# Patient Record
Sex: Male | Born: 1935
Health system: Southern US, Community
[De-identification: ages and names within clinical notes are randomized; demographics above are authoritative.]

## PROBLEM LIST (undated history)

## (undated) DIAGNOSIS — I708 Atherosclerosis of other arteries: Secondary | ICD-10-CM

## (undated) DIAGNOSIS — E785 Hyperlipidemia, unspecified: Secondary | ICD-10-CM

## (undated) DIAGNOSIS — E119 Type 2 diabetes mellitus without complications: Secondary | ICD-10-CM

## (undated) DIAGNOSIS — J449 Chronic obstructive pulmonary disease, unspecified: Secondary | ICD-10-CM

## (undated) DIAGNOSIS — I471 Supraventricular tachycardia, unspecified: Secondary | ICD-10-CM

## (undated) DIAGNOSIS — K289 Gastrojejunal ulcer, unspecified as acute or chronic, without hemorrhage or perforation: Secondary | ICD-10-CM

## (undated) DIAGNOSIS — D696 Thrombocytopenia, unspecified: Secondary | ICD-10-CM

## (undated) DIAGNOSIS — K219 Gastro-esophageal reflux disease without esophagitis: Secondary | ICD-10-CM

## (undated) DIAGNOSIS — F17200 Nicotine dependence, unspecified, uncomplicated: Secondary | ICD-10-CM

## (undated) DIAGNOSIS — N402 Nodular prostate without lower urinary tract symptoms: Secondary | ICD-10-CM

## (undated) DIAGNOSIS — M5126 Other intervertebral disc displacement, lumbar region: Secondary | ICD-10-CM

## (undated) DIAGNOSIS — K2270 Barrett's esophagus without dysplasia: Secondary | ICD-10-CM

## (undated) DIAGNOSIS — I25810 Atherosclerosis of coronary artery bypass graft(s) without angina pectoris: Secondary | ICD-10-CM

## (undated) DIAGNOSIS — R413 Other amnesia: Secondary | ICD-10-CM

## (undated) DIAGNOSIS — K5904 Chronic idiopathic constipation: Secondary | ICD-10-CM

## (undated) DIAGNOSIS — I700 Atherosclerosis of aorta: Secondary | ICD-10-CM

## (undated) DIAGNOSIS — N400 Enlarged prostate without lower urinary tract symptoms: Secondary | ICD-10-CM

## (undated) DIAGNOSIS — H9190 Unspecified hearing loss, unspecified ear: Secondary | ICD-10-CM

## (undated) HISTORY — DX: Chronic obstructive pulmonary disease, unspecified: J44.9

## (undated) HISTORY — DX: Other amnesia: R41.3

## (undated) HISTORY — DX: Barrett's esophagus without dysplasia: K22.70

## (undated) HISTORY — DX: Hyperlipidemia, unspecified: E78.5

## (undated) HISTORY — DX: Benign prostatic hyperplasia without lower urinary tract symptoms: N40.0

## (undated) HISTORY — DX: Chronic idiopathic constipation: K59.04

## (undated) HISTORY — DX: Nicotine dependence, unspecified, uncomplicated: F17.200

## (undated) HISTORY — PX: HEMORRHOID SURGERY: SHX153

## (undated) HISTORY — DX: Nodular prostate without lower urinary tract symptoms: N40.2

## (undated) HISTORY — PX: NOSE SURGERY: SHX723

## (undated) HISTORY — DX: Supraventricular tachycardia, unspecified: I47.10

## (undated) HISTORY — DX: Thrombocytopenia, unspecified: D69.6

## (undated) HISTORY — DX: Other intervertebral disc displacement, lumbar region: M51.26

## (undated) HISTORY — DX: Type 2 diabetes mellitus without complications: E11.9

## (undated) HISTORY — DX: Supraventricular tachycardia: I47.1

## (undated) HISTORY — DX: Unspecified hearing loss, unspecified ear: H91.90

## (undated) HISTORY — DX: Atherosclerosis of coronary artery bypass graft(s) without angina pectoris: I25.810

## (undated) HISTORY — DX: Gastrojejunal ulcer, unspecified as acute or chronic, without hemorrhage or perforation: K28.9

---

## 1997-09-16 HISTORY — PX: CORONARY ARTERY BYPASS GRAFT: SHX141

## 1997-12-21 ENCOUNTER — Inpatient Hospital Stay (HOSPITAL_COMMUNITY): Admission: EM | Admit: 1997-12-21 | Discharge: 1997-12-22 | Payer: Self-pay | Admitting: Emergency Medicine

## 1998-03-10 ENCOUNTER — Inpatient Hospital Stay (HOSPITAL_COMMUNITY): Admission: EM | Admit: 1998-03-10 | Discharge: 1998-03-18 | Payer: Self-pay | Admitting: Emergency Medicine

## 1998-05-02 ENCOUNTER — Encounter (HOSPITAL_COMMUNITY): Admission: RE | Admit: 1998-05-02 | Discharge: 1998-07-31 | Payer: Self-pay | Admitting: *Deleted

## 1999-03-23 ENCOUNTER — Inpatient Hospital Stay (HOSPITAL_COMMUNITY): Admission: EM | Admit: 1999-03-23 | Discharge: 1999-03-24 | Payer: Self-pay | Admitting: Emergency Medicine

## 1999-03-23 ENCOUNTER — Encounter: Payer: Self-pay | Admitting: *Deleted

## 1999-03-24 ENCOUNTER — Encounter: Payer: Self-pay | Admitting: Internal Medicine

## 2000-10-07 ENCOUNTER — Inpatient Hospital Stay (HOSPITAL_COMMUNITY): Admission: EM | Admit: 2000-10-07 | Discharge: 2000-10-08 | Payer: Self-pay | Admitting: Emergency Medicine

## 2000-10-07 ENCOUNTER — Encounter: Payer: Self-pay | Admitting: Emergency Medicine

## 2000-10-08 ENCOUNTER — Encounter: Payer: Self-pay | Admitting: *Deleted

## 2001-01-28 ENCOUNTER — Emergency Department (HOSPITAL_COMMUNITY): Admission: EM | Admit: 2001-01-28 | Discharge: 2001-01-28 | Payer: Self-pay | Admitting: Emergency Medicine

## 2001-01-28 ENCOUNTER — Encounter: Payer: Self-pay | Admitting: Emergency Medicine

## 2002-10-04 ENCOUNTER — Encounter: Payer: Self-pay | Admitting: Family Medicine

## 2002-10-04 ENCOUNTER — Encounter: Admission: RE | Admit: 2002-10-04 | Discharge: 2002-10-04 | Payer: Self-pay | Admitting: Family Medicine

## 2002-10-19 ENCOUNTER — Encounter (INDEPENDENT_AMBULATORY_CARE_PROVIDER_SITE_OTHER): Payer: Self-pay | Admitting: Specialist

## 2002-10-19 ENCOUNTER — Ambulatory Visit (HOSPITAL_COMMUNITY): Admission: RE | Admit: 2002-10-19 | Discharge: 2002-10-19 | Payer: Self-pay | Admitting: Gastroenterology

## 2003-08-04 ENCOUNTER — Encounter: Admission: RE | Admit: 2003-08-04 | Discharge: 2003-08-04 | Payer: Self-pay | Admitting: Family Medicine

## 2004-06-20 ENCOUNTER — Inpatient Hospital Stay (HOSPITAL_COMMUNITY): Admission: EM | Admit: 2004-06-20 | Discharge: 2004-06-21 | Payer: Self-pay | Admitting: Emergency Medicine

## 2004-07-24 ENCOUNTER — Ambulatory Visit: Payer: Self-pay | Admitting: *Deleted

## 2004-12-03 ENCOUNTER — Inpatient Hospital Stay (HOSPITAL_COMMUNITY): Admission: EM | Admit: 2004-12-03 | Discharge: 2004-12-05 | Payer: Self-pay | Admitting: *Deleted

## 2004-12-03 ENCOUNTER — Ambulatory Visit: Payer: Self-pay | Admitting: Cardiology

## 2004-12-19 ENCOUNTER — Ambulatory Visit: Payer: Self-pay | Admitting: *Deleted

## 2005-06-25 ENCOUNTER — Ambulatory Visit: Payer: Self-pay | Admitting: *Deleted

## 2006-01-08 ENCOUNTER — Ambulatory Visit: Payer: Self-pay | Admitting: *Deleted

## 2006-10-28 ENCOUNTER — Ambulatory Visit: Payer: Self-pay | Admitting: *Deleted

## 2008-05-25 ENCOUNTER — Ambulatory Visit: Payer: Self-pay | Admitting: Cardiology

## 2008-05-31 ENCOUNTER — Ambulatory Visit: Payer: Self-pay

## 2009-03-16 HISTORY — PX: COLONOSCOPY: SHX174

## 2009-03-28 LAB — HM COLONOSCOPY

## 2009-04-14 ENCOUNTER — Encounter (INDEPENDENT_AMBULATORY_CARE_PROVIDER_SITE_OTHER): Payer: Self-pay | Admitting: *Deleted

## 2009-05-13 DIAGNOSIS — Z720 Tobacco use: Secondary | ICD-10-CM

## 2009-05-13 DIAGNOSIS — E7800 Pure hypercholesterolemia, unspecified: Secondary | ICD-10-CM

## 2009-05-13 DIAGNOSIS — Z8679 Personal history of other diseases of the circulatory system: Secondary | ICD-10-CM | POA: Insufficient documentation

## 2009-05-17 ENCOUNTER — Ambulatory Visit: Payer: Self-pay | Admitting: Cardiology

## 2010-03-21 ENCOUNTER — Telehealth (INDEPENDENT_AMBULATORY_CARE_PROVIDER_SITE_OTHER): Payer: Self-pay | Admitting: *Deleted

## 2010-03-22 ENCOUNTER — Telehealth (INDEPENDENT_AMBULATORY_CARE_PROVIDER_SITE_OTHER): Payer: Self-pay | Admitting: *Deleted

## 2010-04-05 ENCOUNTER — Encounter: Payer: Self-pay | Admitting: Family Medicine

## 2010-04-16 HISTORY — PX: ROTATOR CUFF REPAIR: SHX139

## 2010-04-26 ENCOUNTER — Ambulatory Visit: Payer: Self-pay | Admitting: Cardiology

## 2010-05-07 ENCOUNTER — Telehealth: Payer: Self-pay | Admitting: Cardiology

## 2010-07-17 ENCOUNTER — Ambulatory Visit: Payer: Self-pay | Admitting: Family Medicine

## 2010-07-17 DIAGNOSIS — K5904 Chronic idiopathic constipation: Secondary | ICD-10-CM

## 2010-07-17 HISTORY — DX: Chronic idiopathic constipation: K59.04

## 2010-08-14 ENCOUNTER — Ambulatory Visit: Payer: Self-pay | Admitting: Internal Medicine

## 2010-08-14 DIAGNOSIS — E118 Type 2 diabetes mellitus with unspecified complications: Secondary | ICD-10-CM

## 2010-08-20 ENCOUNTER — Ambulatory Visit: Payer: Self-pay | Admitting: Internal Medicine

## 2010-08-29 ENCOUNTER — Ambulatory Visit: Payer: Self-pay | Admitting: Internal Medicine

## 2010-10-04 ENCOUNTER — Encounter: Payer: Self-pay | Admitting: Family Medicine

## 2010-10-04 ENCOUNTER — Ambulatory Visit
Admission: RE | Admit: 2010-10-04 | Discharge: 2010-10-04 | Payer: Self-pay | Source: Home / Self Care | Attending: Family Medicine | Admitting: Family Medicine

## 2010-10-04 DIAGNOSIS — R0789 Other chest pain: Secondary | ICD-10-CM | POA: Insufficient documentation

## 2010-10-16 NOTE — Progress Notes (Signed)
  Phone Note Outgoing Call   Call placed by: Scherrie Bateman, LPN,  March 22, 4781 2:37 PM Call placed to: Patient Summary of Call: CALLED PT PER DR WALL NEEDS APPT FOR PRE OP CLEARANCE.APPT FOR AUGUST 11 , 2011 AT 4:00 PM Initial call taken by: Scherrie Bateman, LPN,  March 22, 9561 2:37 PM

## 2010-10-16 NOTE — Assessment & Plan Note (Signed)
Summary: surgical clearance/jml   Visit Type:  surg clearance Primary Provider:  Dr. Clarene Duke in [place]  CC:  pt is needing surg clearance for right shoulder surgery Surgeon will be Marcene Corning w/[facility]...denies any cardiac complaints today.  History of Present Illness: Troy [name] comes in today for surgical clearance for a right rotator cuff surgical procedure with Dr.Dalldorf.  He's having no angina ischemic symptoms. Denies orthopnea, PND or edema.  His last stress Myoview was negative for ischemia. Please see the report below.  I notice on simvastatin 80 and amlodipine. Changes will be suggested.  Clinical Reports Reviewed:  Cardiac Cath:  12/04/2004: Cardiac Cath Findings:   IMPRESSIONS:  1.  Normal left ventricular systolic function.  2.  Native three-vessel coronary artery disease.  3.  Status post coronary bypass surgery.  All grafts were patent.  The left      circumflex is not grafted; however, the disease in the circumflex is      moderate in severity and does not appear to be hemodynamically      significant.   RECOMMENDATIONS:  For continued medical therapy.   MWP/MEDQ  D:  12/04/2004  T:  12/04/2004  Job:  [id number]   cc:   Vale Haven. Andrey Campanile, [name]  [address]  [place]  [place] [address]  Fax: [phone]   E. Graceann Congress, [name]  06/20/2004: Cardiac Cath Findings:   IMPRESSION:  1.  Well preserved left ventricular function.  2.  Continued patency of the internal mammary to the left anterior      descending.  3.  Continued patency of the saphenous vein graft to the distal right and      the diagonal branch.  4.  Modest progression of disease into the posterolateral segment of the      right coronary artery.  5.  Moderate progression of disease in the first obtuse marginal branch.   DISPOSITION:  Whether or not the pain is ischemic is difficult to be  certain.  Enzymes are so far negative.  The ostium of the circumflex could  be the culprit.   Nonetheless, it is not ideal for percutaneous intervention  and would have to be approached likely with a cutting balloon.  It would not  be favorable for stenting.  With a prior history of restenosis, one would  worry about this as a late outcome.  Current medical treatment is  recommended.  We will probably do a Cardiolite study for further evaluation  to guide further therapy.   TDS/MEDQ  D:  06/20/2004  T:  06/20/2004  Job:  [id number]   cc:   Vale Haven. Andrey Campanile, [name]  [address]  [place]  [place] [address]  Fax: [phone] Cardiac Cath Findings:  ADDENDUM:  The study also demonstrates about 30% narrowing in the saphenous  vein graft to the diagonal branch on review.  This does not appear to be  high grade.   TDS/MEDQ  D:  06/20/2004  T:  06/20/2004  Job:  [id number]  Nuclear Study:  05/31/2008:  Excerise capacity: Adenosine with low level exercise  Blood Pressure response: Normal blood pressure response  Clinical symptoms: Atypical chest pain  ECG impression: No significant ST segment change suggestive of ischemia  Overall impression: Normal stress nuclear study  Troy C. Eden Emms, MD   05/19/2003:  Interpretation: Subtle basal septal hypoperfusion with no large reversible defects to suggest ischemia. There are no major wall motion abnormalities with a normal ejection fraction of 62%. Overall suggests  a low-risk study.  Troy Sidle, MD  10/08/2000:   MYOCARDIAL IMAGING WITH SPECT:  THE INITIAL STUDY WAS DONE AT REST WITH INJECTION OF 10   mCi SESTAMIBI.  AFTER INFUSION OF ADENOSINE, 30 mCi   WERE INJECTED.  POST ADENOSINE IMAGE REVEALS SATISFACTORY   PERFUSION WITH SOME THINNING OF THE HIGH SEPTUM.  REST   STUDY ALSO REVEALS SATISFACTORY PERFUSION WITH THINNING OF   THE HIGH SEPTUM.  NO REVERSIBILITY.  WALL MOTION STUDY   REVEALS SATISFACTORY OVERALL CONTRACTION OF THE LEFT   VENTRICULAR WALL.   EJECTION FRACTION IS 58%.   IMPRESSION:  1.  NO SIGNIFICANT  ABNORMALITY.  NO ISCHEMIA.   2.  EJECTION FRACTION IS 58%.   TRANSCRIBED DATE:  TLM  AN                                           Read YQ:MVHQ A   [name], [name]  10/08/2000 22:30                                           Released IO:NGEX   A.  [name], [name]   Current Medications (verified): 1)  Isosorbide Mononitrate Cr 30 Mg Xr24h-Tab (Isosorbide Mononitrate) .... 1/2 Tab Once Daily 2)  Metoprolol Tartrate 50 Mg Tabs (Metoprolol Tartrate) .... 1/2 Tab Two Times A Day 3)  Simvastatin 80 Mg Tabs (Simvastatin) .[name].. 1 Tab At Bedtime 4)  Aspirin 81 Mg Tbec (Aspirin) .... Take One Tablet By Mouth Daily 5)  Metformin Hcl 500 Mg Tabs (Metformin Hcl) .[name].. 1 Tab Two Times A Day 6)  Omeprazole 20 Mg Tbec (Omeprazole) .[name].. 1 Tab Once Daily 7)  Nitroglycerin 0.4 Mg Subl (Nitroglycerin) .... One Tablet Under Tongue Every 5 Minutes As Needed For Chest Pain---May Repeat Times Three 8)  Amlodipine Besylate 5 Mg Tabs (Amlodipine Besylate) .... 1/2 Tab Once Daily  Allergies: 1)  ! Codeine  Vital Signs:  Patient profile:   77 year old male Height:      70 inches Weight:      177.8 pounds BMI:     25.60 Pulse rate:   56 / minute Pulse rhythm:   irregular BP sitting:   126 / 54  (left arm) Cuff size:   large  Vitals Entered By: [name], CMA (April 26, 2010 3:56 PM)   Impression & Recommendations:  Problem # 1:  CAD, ARTERY BYPASS GRAFT/ X3..1999 (ICD-414.04) Assessment Unchanged He is stable. I made no changes to his medical program. I cleared him for surgery and letter is written. His updated medication list for this problem includes:    Isosorbide Mononitrate Cr 30 Mg Xr24h-tab (Isosorbide mononitrate) .[name]... 1/2 tab once daily    Metoprolol Tartrate 50 Mg Tabs (Metoprolol tartrate) .[name]... 1/2 tab two times a day    Aspirin 81 Mg Tbec (Aspirin) .[name]... Take one tablet by mouth daily    Nitroglycerin 0.4 Mg Subl (Nitroglycerin) ..... One tablet under tongue every 5 minutes as needed for  chest pain---may repeat times three    Amlodipine Besylate 5 Mg Tabs (Amlodipine besylate) .[name]... 1/2 tab once daily  Problem # 2:  HYPERCHOLESTEROLEMIA (ICD-272.0) I have recommended him decrease his simvastatin at 20 mg per day being on amlodipine. He will price out  Lipitor which will go generic is fall. He eventually should be on 40 mg of Lipitor or atorvastatin this fall. His updated medication list for this problem includes:    Lipitor 40 Mg Tabs (Atorvastatin calcium) .[name]... Take 1 tablet daily  Problem # 3:  TOBACCO ABUSE (ICD-305.1) Assessment: Unchanged advised to quit  Problem # 4:  ABDOMINAL AORTIC ANEURYSM, HX OF (ICD-V12.50)  Patient Instructions: 1)  Your physician recommends that you schedule a follow-up appointment in: 1 year with Dr. Daleen Squibb 2)  Your physician has recommended you make the following change in your medication: STOP Simvastatin   Start Lipitor Prescriptions: LIPITOR 40 MG TABS (ATORVASTATIN CALCIUM) take 1 tablet daily  #90 x 3   Entered by:   Lisabeth Devoid RN   Authorized by:   Gaylord Shih, MD, [facility]   Signed by:   Lisabeth Devoid RN on 04/26/2010   Method used:   Print then Give to Patient   RxID:   [id number]

## 2010-10-16 NOTE — Progress Notes (Signed)
Summary: order to stop asa  Phone Note From Other Clinic   Caller: Zollie Scale from [facility] [phone] fax # [phone] Request: Talk with Nurse Summary of Call: pt having surgery on 8/25 -rotate cuff, need an order fax to stop his asa.  Initial call taken by: Lorne Skeens,  May 07, 2010 8:34 AM  Follow-up for Phone Call        ok with me Follow-up by: Gaylord Shih, MD, [facility],  May 07, 2010 10:48 AM     Appended Document: order to stop asa Faxed to surgery center. Mylo Red RN

## 2010-10-16 NOTE — Assessment & Plan Note (Signed)
Summary: fell and hurt rib/alc   Vital Signs:  Patient profile:   77 year old male Weight:      175.25 pounds Temp:     97.7 degrees F oral Pulse rate:   60 / minute Pulse rhythm:   regular BP sitting:   136 / 70  (left arm) Cuff size:   large  Vitals Entered By: Selena Batten Dance CMA Duncan Dull) (August 20, 2010 11:22 AM) CC: Fell and hurt Left rib area   History of Present Illness: CC: fall hurt ribs.  2wk h/o fall over 2 deer (decorations) in front yard when blowing leaves.  Larey Seat backwards and landed on backpack.  Left lateral ribs hurting ever since.  Hurts to take deep breath, hurts to lay on left side.  + pain with cough.  Initially with bruise now better.  No SOB.  No worsening cough.    Current Medications (verified): 1)  Isosorbide Mononitrate Cr 30 Mg Xr24h-Tab (Isosorbide Mononitrate) .... 1/2 Tab Once Daily 2)  Metoprolol Tartrate 50 Mg Tabs (Metoprolol Tartrate) .... 1/2 Tab Two Times A Day 3)  Simvastatin 80 Mg Tabs (Simvastatin) .... 1/2 Pill Daily 4)  Aspirin 81 Mg Tbec (Aspirin) .... Take One Tablet By Mouth Daily 5)  Metformin Hcl 500 Mg Tabs (Metformin Hcl) .[name].. 1 Tab Two Times A Day 6)  Omeprazole 20 Mg Tbec (Omeprazole) .[name].. 1 Tab Once Daily 7)  Nitroglycerin 0.4 Mg Subl (Nitroglycerin) .... One Tablet Under Tongue Every 5 Minutes As Needed For Chest Pain---May Repeat Times Three 8)  Miralax  Powd (Polyethylene Glycol 3350) .[name].. 17gm in 8oz Fluid Daily As Needed Constipation  Allergies: 1)  ! Codeine 2)  ! Flexeril  Past History:  Past Medical History: CAD, ARTERY BYPASS GRAFT/ X3. 1999 (ICD-414.04) HYPERCHOLESTEROLEMIA (ICD-272.0) ABDOMINAL AORTIC ANEURYSM, HX OF (ICD-V12.50)  ??  no mention in previous records TOBACCO ABUSE (ICD-305.1) BPH (600.00) T2DM dx  ~2008 h/o ulcers h/o L benign prostate nodule s/p eval Uro WNL ([facility]) ?COPD  Social History: smoking <1ppd.  seldom EtOH, no rec drugs rare caffeine, decaf coffee. He is married.  He has 2  children.  Lives with wife.  No pets. He does dip some Grizzly.  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   He was in the [employer] for 4 years.  He gets most of his blood work at the [place].  Review of Systems       per HPI  Physical Exam  General:  Well developed, well nourished, in no acute distress. Chest Wall:  tender L lateral ribs and inferiolateral L scapula margin as well as muscles between.  point tender 4-5th anterior rib midline over pec major Lungs:  coarse breath sounds mild exp wheezing.  equal breath sounds bilaterally Heart:  Normal rate and regular rhythm. S1 and S2 normal without gallop, murmur, click, rub or other extra sounds.   Impression & Recommendations:  Problem # 1:  RIB PAIN, LEFT SIDED (ICD-786.50) Xray given age, smoking, likely COPD r/o PTX.  supportive care - ice/heat, tylenol scheduled for next 1-2 wks.   to call if not better with this for addition of tramadol.  only 2 wks, expect healing to take 6 wks.  discussed expected progress.  xray - COPD, s/p CABG, no acute fracture noted.  Orders: T-2 View CXR (71020TC)  Problem # 2:  COPD PER CXR (ICD-496) discussed finding, advised to let me know if any SOB, limiting exercise will need med.  monitor for now.  Complete Medication List: 1)  Isosorbide Mononitrate Cr 30 Mg Xr24h-tab (Isosorbide mononitrate) .... 1/2 tab once daily 2)  Metoprolol Tartrate 50 Mg Tabs (Metoprolol tartrate) .... 1/2 tab two times a day 3)  Simvastatin 80 Mg Tabs (Simvastatin) .... 1/2 pill daily 4)  Aspirin 81 Mg Tbec (Aspirin) .... Take one tablet by mouth daily 5)  Metformin Hcl 500 Mg Tabs (Metformin hcl) .[name].. 1 tab two times a day 6)  Omeprazole 20 Mg Tbec (Omeprazole) .[name].. 1 tab once daily 7)  Nitroglycerin 0.4 Mg Subl (Nitroglycerin) .... One tablet under tongue every 5 minutes as needed for chest pain---may repeat times three 8)  Miralax Powd (Polyethylene glycol 3350) .[name].. 17gm in 8oz fluid daily as  needed constipation 9)  Tylenol Extra Strength 500 Mg Tabs (Acetaminophen) .... Take 1-2 by mouth three times a day  x 1 wk then as needed  Patient Instructions: 1)  could be rib fracture, doesn't look like anything else going on based on xrays. 2)  Treat with tylenol 500mg  1-2 pills three times a day for next 5 days, ice/heat to chest area. 3)  If not better, call us for stronger medicine. 4)  Good to see you today, call clinic with quesitons.   Orders Added: 1)  Est. Patient Level III [09811] 2)  T-2 View CXR [71020TC]    Current Allergies (reviewed today): ! CODEINE ! FLEXERIL

## 2010-10-16 NOTE — Letter (Signed)
Summary: [facility] benign prostate nodule [facility]   Imported By: Lanelle Bal 07/24/2010 08:41:52  _____________________________________________________________________  External Attachment:    Type:   Image     Comment:   External Document  Appended Document: [facility] benign prostate nodule [facility]    Clinical Lists Changes  Problems: Added new problem of BPH (ICD-600.00) Observations: Added new observation of PAST MED HX: CAD, ARTERY BYPASS GRAFT/ X3. 1999 (ICD-414.04) HYPERCHOLESTEROLEMIA (ICD-272.0) ABDOMINAL AORTIC ANEURYSM, HX OF (ICD-V12.50)  ??  no mention in previous records TOBACCO ABUSE (ICD-305.1) BPH (600.00) h/o ulcers h/o L benign prostate nodule s/p eval Uro WNL Vernie Ammons) (07/24/2010 8:45)        Past History:  Past Medical History: CAD, ARTERY BYPASS GRAFT/ X3. 1999 (ICD-414.04) HYPERCHOLESTEROLEMIA (ICD-272.0) ABDOMINAL AORTIC ANEURYSM, HX OF (ICD-V12.50)  ??  no mention in previous records TOBACCO ABUSE (ICD-305.1) BPH (600.00) h/o ulcers h/o L benign prostate nodule s/p eval Uro WNL ([facility])

## 2010-10-16 NOTE — Progress Notes (Signed)
  Walk in Patient Form Recieved "Pt Needs Surgical Clearance w/ Dr.Blume" sent to [facility] Mesiemore  March 21, 2010 8:32 AM

## 2010-10-16 NOTE — Letter (Signed)
Summary: Clearance Letter  [facility], Main Office  [address]   [place], [place] [address]   Phone: [phone]  Fax: [phone]    April 26, 2010  Re:     Troy [name] [facility] Address:   [address]     [place], [place]  [address] DOB:     1933/04/29 MRN:     [id number]   To whom it may concern:  [name] is cleared from a cardiac standpoint today for his right rotator cuff surgery.  He is at low operative risk for surgery.  For any questions you may call [facility] at [phone].         Sincerely,      Dr. Valera Castle Lisabeth Devoid RN

## 2010-10-16 NOTE — Assessment & Plan Note (Signed)
Summary: 4WK F/U ON CONSTIPATION AND SMOKING / LFW   Vital Signs:  Patient profile:   77 year old male Weight:      175.25 pounds Temp:     97.9 degrees F oral Pulse rate:   54 / minute Pulse rhythm:   regular BP sitting:   104 / 60  (left arm) Cuff size:   large  Vitals Entered By: Selena Batten Dance CMA Duncan Dull) (August 14, 2010 9:04 AM) CC: 4 week follow up   History of Present Illness: CC: f/u constipation and smoking  constipation - miralax once a day - stools soft and goes 2-3x/day.  if miralax two times a day, has accidents with diarrhea.  Using metamucil intermittently.  Off citramag.  no longer constipation.  smoking - < 1ppd, has gum but hasn't started yet.    Current Medications (verified): 1)  Isosorbide Mononitrate Cr 30 Mg Xr24h-Tab (Isosorbide Mononitrate) .... 1/2 Tab Once Daily 2)  Metoprolol Tartrate 50 Mg Tabs (Metoprolol Tartrate) .... 1/2 Tab Two Times A Day 3)  Simvastatin 80 Mg Tabs (Simvastatin) .... 1/2 Pill Daily 4)  Aspirin 81 Mg Tbec (Aspirin) .... Take One Tablet By Mouth Daily 5)  Metformin Hcl 500 Mg Tabs (Metformin Hcl) .[name].. 1 Tab Two Times A Day 6)  Omeprazole 20 Mg Tbec (Omeprazole) .[name].. 1 Tab Once Daily 7)  Nitroglycerin 0.4 Mg Subl (Nitroglycerin) .... One Tablet Under Tongue Every 5 Minutes As Needed For Chest Pain---May Repeat Times Three 8)  Miralax  Powd (Polyethylene Glycol 3350) .[name].. 17gm in 8oz Fluid Daily As Needed Constipation  Allergies: 1)  ! Codeine 2)  ! Flexeril  Past History:  Past Medical History: CAD, ARTERY BYPASS GRAFT/ X3. 1999 (ICD-414.04) HYPERCHOLESTEROLEMIA (ICD-272.0) ABDOMINAL AORTIC ANEURYSM, HX OF (ICD-V12.50)  ??  no mention in previous records TOBACCO ABUSE (ICD-305.1) BPH (600.00) T2DM dx  ~2008 h/o ulcers h/o L benign prostate nodule s/p eval Uro WNL ([facility]elin)  Social History: smoking <1ppd.  seldom EtOH, no rec drugs rare caffeine, decaf coffee. He is married.  He has 2 children.  Lives with wife.  No  pets. He does dip some Grizzly.  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   He was in the [employer] for 4 years.  He gets most of his blood work at the [place].  Review of Systems       per HPI  Physical Exam  General:  Well developed, well nourished, in no acute distress. Mouth:  Oral mucosa and oropharynx without lesions or exudates.  Teeth in good repair.  MMM Neck:  Neck supple, no JVD. No masses, thyromegaly or abnormal cervical nodes.  no carotid bruits Lungs:  Normal respiratory effort, chest expands symmetrically. Lungs are clear to auscultation, no crackles or wheezes. Heart:  Normal rate and regular rhythm. S1 and S2 normal without gallop, murmur, click, rub or other extra sounds. Abdomen:  Bowel sounds positive; abdomen soft and non-tender without masses, organomegaly, or hernias noted. No hepatosplenomegaly. Pulses:  2+ rad pulses Extremities:  no pedal edema Skin:  Intact without lesions or rashes.   Impression & Recommendations:  Problem # 1:  CONSTIPATION (ICD-564.00) improved on miralax.  advised to increase metamucil and see if he can go off miralax.  goal 1 soft stool a day, pt happy with 1 soft stool every 2-3 days (his norm) His updated medication list for this problem includes:    Miralax Powd (Polyethylene glycol 3350) .Marland KitchenMarland KitchenMarland KitchenMarland Kitchen 17gm in 8oz fluid daily as needed constipation  Problem # 2:  TOBACCO ABUSE (ICD-305.1) Encouraged smoking cessation.  discussed park and chew method.  Problem # 3:  DIABETES MELLITUS, TYPE II (ICD-250.00) well controlled on metformin.  not on ACEI, unsure if BP would tolerate.  His updated medication list for this problem includes:    Aspirin 81 Mg Tbec (Aspirin) .[name]... Take one tablet by mouth daily    Metformin Hcl 500 Mg Tabs (Metformin hcl) .[name]... 1 tab two times a day  Labs Reviewed: Creat: 1.17 (05/01/2010)    Reviewed HgBA1c results: 6.4 (05/01/2010)  6.7 (11/19/2006)  Problem # 4:  HYPERCHOLESTEROLEMIA  (ICD-272.0) good control on simvastatin 1/2 pill daily.   His updated medication list for this problem includes:    Simvastatin 80 Mg Tabs (Simvastatin) .[name]... 1/2 pill daily  Labs Reviewed: SGOT: 20 (05/01/2010)   SGPT: 19 (05/01/2010)   HDL:33 (05/01/2010)  LDL:65 (05/01/2010)  Chol:127 (05/01/2010)  Trig:145 (05/01/2010)  Problem # 5:  Preventive Health Care (ICD-V70.0) Tdap today.  UTD tetanus, colon.  unsure about pneumonia shot but states thinks he's received.  Complete Medication List: 1)  Isosorbide Mononitrate Cr 30 Mg Xr24h-tab (Isosorbide mononitrate) .... 1/2 tab once daily 2)  Metoprolol Tartrate 50 Mg Tabs (Metoprolol tartrate) .... 1/2 tab two times a day 3)  Simvastatin 80 Mg Tabs (Simvastatin) .... 1/2 pill daily 4)  Aspirin 81 Mg Tbec (Aspirin) .... Take one tablet by mouth daily 5)  Metformin Hcl 500 Mg Tabs (Metformin hcl) .[name].. 1 tab two times a day 6)  Omeprazole 20 Mg Tbec (Omeprazole) .[name].. 1 tab once daily 7)  Nitroglycerin 0.4 Mg Subl (Nitroglycerin) .... One tablet under tongue every 5 minutes as needed for chest pain---may repeat times three 8)  Miralax Powd (Polyethylene glycol 3350) .[name].. 17gm in 8oz fluid daily as needed constipation  Other Orders: Tdap => 81yrs IM ([address]366) Admin 1st Vaccine (44034)  Patient Instructions: 1)  Return early next year for follow up on smoking, sooner if needed. 2)  return 04/2011 for CPE. 3)  Good to see you today. 4)  Continue miralax and fiber for constipation 5)  try nicotine gum for cutting back on smoking    Orders Added: 1)  Tdap => 6yrs IM [90715] 2)  Admin 1st Vaccine [90471] 3)  Est. Patient Level III [74259]   Immunizations Administered:  Tetanus Vaccine:    Vaccine Type: Tdap    Site: left deltoid    Mfr: GlaxoSmithKline    Dose: 0.5 ml    Route: IM    Given by: Selena Batten Dance CMA (AAMA)    Exp. Date: 07/05/2012    Lot #: [id number]    VIS given: 08/03/08 version given August 14, 2010.   Immunizations Administered:  Tetanus Vaccine:    Vaccine Type: Tdap    Site: left deltoid    Mfr: GlaxoSmithKline    Dose: 0.5 ml    Route: IM    Given by: Selena Batten Dance CMA (AAMA)    Exp. Date: 07/05/2012    Lot #: [id number]    VIS given: 08/03/08 version given August 14, 2010.  Current Allergies (reviewed today): ! CODEINE ! FLEXERIL   Prevention & Chronic Care Immunizations   Influenza vaccine: given  (06/20/2010)   Influenza vaccine due: 05/18/2011    Tetanus booster: 08/14/2010: Tdap   Tetanus booster due: 08/14/2020    Pneumococcal vaccine: Not documented    H. zoster vaccine: Not documented  Colorectal Screening   Hemoccult: Not documented    Colonoscopy:  Pathology:  Adenomatous polyp.        Pathology:  Hyperplastic polyp.       (03/28/2009)   Colonoscopy action/deferral: Repeat colonoscopy in 5 years.   (03/28/2009)  Other Screening   PSA: 0.84  (05/01/2010)   Smoking status: Not documented  Diabetes Mellitus   HgbA1C: 6.4  (05/01/2010)    Eye exam: Not documented    Foot exam: Not documented   High risk foot: Not documented   Foot care education: Not documented    Urine microalbumin/creatinine ratio: Not documented  Lipids   Total Cholesterol: 127  (05/01/2010)   LDL: 65  (05/01/2010)   LDL Direct: Not documented   HDL: 33  (05/01/2010)   Triglycerides: 145  (05/01/2010)    SGOT (AST): 20  (05/01/2010)   SGPT (ALT): 19  (05/01/2010)   Alkaline phosphatase: 92  (05/01/2010)   Total bilirubin: 0.4  (05/01/2010)  Self-Management Support :    Diabetes self-management support: Not documented    Lipid self-management support: Not documented

## 2010-10-16 NOTE — Assessment & Plan Note (Signed)
Summary: NEW PT TO EST/CLE   Vital Signs:  Patient profile:   77 year old male Weight:      175 pounds Temp:     97.9 degrees F oral Pulse rate:   64 / minute Pulse rhythm:   regular BP sitting:   110 / 60  (left arm) Cuff size:   large  Vitals Entered By: Selena Batten Dance CMA (AAMA) (July 17, 2010 2:00 PM) CC: New patient to establish care   History of Present Illness: CC: new patient, establish  Here because last doctor told him to take codeine but was allergic  1. constipation - taking citromagnesium for last 3-4 wks because constipated.  Been constipated x 1 month.  Never been on miralax.  No stool in last 6 days.  No abd pain currently but feels like needs to go but can't.  2. smoking - <1ppd, x 50 years.  quit cold [place] when had CABG.  patches didn't really help.    HLD- previously on simvsatatin 80, was on amlodipine so recommended change to lipitor, but not carried at [place].  since then stopped amlodipine, taking 80mg  simvastatin.  preventative: flu shot early october [facility] unsure about tetanus had pna shot, unsure when gets blood done at [facility] routinely. last prostate checked at Dr. Phoebe Perch this year.  Current Medications (verified): 1)  Isosorbide Mononitrate Cr 30 Mg Xr24h-Tab (Isosorbide Mononitrate) .... 1/2 Tab Once Daily 2)  Metoprolol Tartrate 50 Mg Tabs (Metoprolol Tartrate) .... 1/2 Tab Two Times A Day 3)  Simvastatin 80 Mg Tabs (Simvastatin) .... 1/2 Pill Daily 4)  Aspirin 81 Mg Tbec (Aspirin) .... Take One Tablet By Mouth Daily 5)  Metformin Hcl 500 Mg Tabs (Metformin Hcl) .[name].. 1 Tab Two Times A Day 6)  Omeprazole 20 Mg Tbec (Omeprazole) .[name].. 1 Tab Once Daily 7)  Nitroglycerin 0.4 Mg Subl (Nitroglycerin) .... One Tablet Under Tongue Every 5 Minutes As Needed For Chest Pain---May Repeat Times Three  Allergies: 1)  ! Codeine 2)  ! Flexeril  Past History:  Past Medical History: CAD, ARTERY BYPASS GRAFT/ X3. 1999 (ICD-414.04) HYPERCHOLESTEROLEMIA  (ICD-272.0) ABDOMINAL AORTIC ANEURYSM, HX OF (ICD-V12.50) TOBACCO ABUSE (ICD-305.1)  Past Surgical History: CABG 1999..Dr. Cornelius Moras hemorrhoid surgery nose operation Colonoscopy x 3 latest 2010 [facility]) RTC repair bilaterally, latest R 04/2010 ([facility]?)  Family History: F: colon CA (70s) Negative for premature coronary artery disease.   No DM, CVA, CAD/MI  Social History: smoking <1ppd.  seldom EtOH, no rec drugs rare caffeine, decaf coffee. He is married.  He has 2 children.  Lives with wife.  No pets. He does dip some Grizzly.  He occasionally has an  Occupation: retired Database administrator He is very active.   He was in the [employer] for 4 years.  He gets most of his blood work at the [place].  Review of Systems  The patient denies anorexia, fever, weight loss, weight gain, vision loss, decreased hearing, hoarseness, chest pain, syncope, dyspnea on exertion, peripheral edema, prolonged cough, headaches, hemoptysis, abdominal pain, melena, hematochezia, severe indigestion/heartburn, hematuria, incontinence, muscle weakness, transient blindness, difficulty walking, depression, and testicular masses.    Physical Exam  General:  Well developed, well nourished, in no acute distress. Head:  normocephalic and atraumatic Eyes:  PERRLA/EOM intact; conjunctiva and lids normal. Ears:  dry cerumen bilaterally Nose:  External nasal examination shows no deformity or inflammation. Nasal mucosa are pink and moist without lesions or exudates. Mouth:  Oral mucosa and oropharynx without lesions or  exudates.  Teeth in good repair.  MMM Neck:  Neck supple, no JVD. No masses, thyromegaly or abnormal cervical nodes. Lungs:  Normal respiratory effort, chest expands symmetrically. Lungs are clear to auscultation, no crackles or wheezes. Heart:  Normal rate and regular rhythm. S1 and S2 normal without gallop, murmur, click, rub or other extra sounds. Abdomen:  Bowel sounds positive; abdomen soft  and non-tender without masses, organomegaly, or hernias noted. No hepatosplenomegaly. Msk:  Back normal, normal gait. Muscle strength and tone normal. Pulses:  2+ rad pulses, DP Extremities:  No clubbing or cyanosis. Neurologic:  CN grossly intact, station and gait intact. Skin:  Intact without lesions or rashes. Psych:  full affect   Impression & Recommendations:  Problem # 1:  CONSTIPATION (ICD-564.00) Discussed dietary fiber measures and increased water intake.  rec stop citramagnesium.  start miralax, continue stool softener.  rtc 1 mo for f/u  His updated medication list for this problem includes:    Miralax Powd (Polyethylene glycol 3350) .Marland KitchenMarland KitchenMarland KitchenMarland Kitchen 17gm in 8oz fluid daily as needed constipation  Problem # 2:  TOBACCO ABUSE (ICD-305.1) 5 min spent discussing smoking cessation.  would likely not recommend chantix, didn't like patches.  interested in nicotine gum.  consider wellbutrin if not helping.  currnelty at <1 ppd.  action phase of stages of change.  Problem # 3:  HYPERCHOLESTEROLEMIA (ICD-272.0) rec simva 40mg  daily.  off amlodipine, BP stable. His updated medication list for this problem includes:    Simvastatin 80 Mg Tabs (Simvastatin) .[name]... 1/2 pill daily  Problem # 4:  ABDOMINAL AORTIC ANEURYSM, HX OF (ICD-V12.50) will review records and monitor.  Problem # 5:  CAD, ARTERY BYPASS GRAFT/ X3..1999 (ICD-414.04) followed by Dr. Daleen Squibb.   The following medications were removed from the medication list:    Amlodipine Besylate 5 Mg Tabs (Amlodipine besylate) .[name]... 1/2 tab once daily His updated medication list for this problem includes:    Isosorbide Mononitrate Cr 30 Mg Xr24h-tab (Isosorbide mononitrate) .[name]... 1/2 tab once daily    Metoprolol Tartrate 50 Mg Tabs (Metoprolol tartrate) .[name]... 1/2 tab two times a day    Aspirin 81 Mg Tbec (Aspirin) .[name]... Take one tablet by mouth daily    Nitroglycerin 0.4 Mg Subl (Nitroglycerin) ..... One tablet under tongue every 5 minutes as  needed for chest pain---may repeat times three  Complete Medication List: 1)  Isosorbide Mononitrate Cr 30 Mg Xr24h-tab (Isosorbide mononitrate) .... 1/2 tab once daily 2)  Metoprolol Tartrate 50 Mg Tabs (Metoprolol tartrate) .... 1/2 tab two times a day 3)  Simvastatin 80 Mg Tabs (Simvastatin) .... 1/2 pill daily 4)  Aspirin 81 Mg Tbec (Aspirin) .... Take one tablet by mouth daily 5)  Metformin Hcl 500 Mg Tabs (Metformin hcl) .[name].. 1 tab two times a day 6)  Omeprazole 20 Mg Tbec (Omeprazole) .[name].. 1 tab once daily 7)  Nitroglycerin 0.4 Mg Subl (Nitroglycerin) .... One tablet under tongue every 5 minutes as needed for chest pain---may repeat times three 8)  Miralax Powd (Polyethylene glycol 3350) .[name].. 17gm in 8oz fluid daily as needed constipation  Patient Instructions: 1)  Return in 3-4 weeks for follow up on constipation and smoking. 2)  Start miralax once daily with fluid.   3)  Start soluble fiber supplement (like benefiber or metamucil) 4)  Increase water intake. 5)  For smoking cessation - buy over the counter nicotine gum and "park and chew" 6)  Go down to 40mg  simvastatin (1/2 pill daily). 7)  Pleasure to meet  you today, call clinic with quesitons. Prescriptions: MIRALAX  POWD (POLYETHYLENE GLYCOL 3350) 17gm in 8oz fluid daily as needed constipation  #1 x 0   Entered and Authorized by:   Eustaquio Boyden  MD   Signed by:   Eustaquio Boyden  MD on 07/17/2010   Method used:   Electronically to        [facility] [address] (retail)       [address]       [place], [place]  [id number]       Ph: [phone]       Fax: [phone]   RxID:   [id number]    Orders Added: 1)  New Patient Level III [24401]    Current Allergies (reviewed today): ! CODEINE ! FLEXERIL  Appended Document: NEW PT TO EST/CLE    Clinical Lists Changes  Observations: Added new observation of PAST SURG HX: CABG 1999..Dr. Cornelius Moras hemorrhoid surgery nose operation Colonoscopy  with small polyp [[facility]] (11/2007 and again 03/2009) rpt 5 years Nuclear Stress study 05/2008 - normal test [[facility]] RTC repair bilaterally, latest R 04/2010 ([facility]?)  (07/18/2010 18:14) Added new observation of HPI: CPE 04/2010 (07/18/2010 18:14) Added new observation of PRIMARY MD: Eustaquio Boyden  MD (07/18/2010 18:14) Added new observation of PAST MED HX: CAD, ARTERY BYPASS GRAFT/ X3. 1999 (ICD-414.04) HYPERCHOLESTEROLEMIA (ICD-272.0) ABDOMINAL AORTIC ANEURYSM, HX OF (ICD-V12.50)  ??  no mention in previous records TOBACCO ABUSE (ICD-305.1) h/o ulcers h/o L prostate nodule s/p eval Uro WNL (Ottelin) (07/18/2010 18:14) Added new observation of FLU VAX: given (06/20/2010 18:34) Added new observation of VIT D 25-OH: 39 ng/mL (05/01/2010 18:14) Added new observation of PLATELETK/UL: 122 K/uL (05/01/2010 18:14) Added new observation of HCT: 43.2 % (05/01/2010 18:14) Added new observation of HGB: 13.5 g/dL (02/72/5366 44:03) Added new observation of WBC COUNT: 6.5 10*3/microliter (05/01/2010 18:14) Added new observation of TSH: 2.154 microintl units/mL (05/01/2010 18:14) Added new observation of HGBA1C: 6.4 % (05/01/2010 18:14) Added new observation of PSA: 0.84 ng/mL (05/01/2010 18:14) Added new observation of B12: 447 pg/mL (05/01/2010 18:14) Added new observation of CALCIUM: 10.0 mg/dL (47/42/5956 38:75) Added new observation of ALBUMIN: 4.4 g/dL (64/33/2951 88:41) Added new observation of PROTEIN, TOT: 7.4 g/dL (66/02/3015 01:09) Added new observation of SGPT (ALT): 19 units/L (05/01/2010 18:14) Added new observation of SGOT (AST): 20 units/L (05/01/2010 18:14) Added new observation of ALK PHOS: 92 units/L (05/01/2010 18:14) Added new observation of BILI TOTAL: 0.4 mg/dL (32/35/5732 20:25) Added new observation of CREATININE: 1.17 mg/dL (42/70/6237 62:83) Added new observation of BUN: 26 mg/dL (15/17/6160 73:71) Added new observation of BG RANDOM: 111 mg/dL (03/11/9484  46:27) Added new observation of CL SERUM: 105 meq/L (05/01/2010 18:14) Added new observation of K SERUM: 4.5 meq/L (05/01/2010 18:14) Added new observation of NA: 138 meq/L (05/01/2010 18:14) Added new observation of LDL: 65 mg/dL (03/50/0938 18:29) Added new observation of HDL: 33 mg/dL (93/71/6967 89:38) Added new observation of TRIGLYC TOT: 145 mg/dL (07/02/5101 58:52) Added new observation of CHOLESTEROL: 127 mg/dL (77/82/4235 36:14) Added new observation of CREATININE: 1.17 mg/dL (43/15/4008 67:61) Added new observation of BG RANDOM: 113 mg/dL (95/05/3266 12:45) Added new observation of CO2 PLSM/SER: 22 meq/L (01/02/2010 18:14) Added new observation of CL SERUM: 106 meq/L (01/02/2010 18:14) Added new observation of K SERUM: 4.8 meq/L (01/02/2010 18:14) Added new observation of NA: 140 meq/L (01/02/2010 18:14) Added new observation of COLONOSCOPY:  Pathology:  Adenomatous polyp.        Pathology:  Hyperplastic polyp.       (  03/28/2009 18:41) Added new observation of COLONRECACT: Repeat colonoscopy in 5 years.   (03/28/2009 18:40) Added new observation of COLONOSCOPY:  Results: Polyp hepative flexure and sigmoid colon, small int/ext hemorrhoids.  Location:  [facility]. [facility]   (03/28/2009 18:40) Added new observation of PSA: 0.82 ng/mL (03/09/2009 18:14) Added new observation of COLONOSCOPY:  Results: Polyp.  Pathology:  Adenomatous polyp.        Location:  [facility].     (11/18/2007 18:43) Added new observation of HGBA1C: 6.7 % (11/19/2006 18:14) Added new observation of TD BOOSTER: Td  (07/01/1999 18:57)        Past History:  Past Medical History: CAD, ARTERY BYPASS GRAFT/ X3. 1999 (ICD-414.04) HYPERCHOLESTEROLEMIA (ICD-272.0) ABDOMINAL AORTIC ANEURYSM, HX OF (ICD-V12.50)  ??  no mention in previous records TOBACCO ABUSE (ICD-305.1) h/o ulcers h/o L prostate nodule s/p eval Uro WNL (Ottelin)  Past Surgical History: CABG 1999..Dr. Cornelius Moras hemorrhoid  surgery nose operation Colonoscopy with small polyp [[facility]] (11/2007 and again 03/2009) rpt 5 years Nuclear Stress study 05/2008 - normal test [[facility]] RTC repair bilaterally, latest R 04/2010 ([facility]?)   Colonoscopy  Procedure date:  03/28/2009  Findings:       Results: Polyp hepative flexure and sigmoid colon, small int/ext hemorrhoids.  Location:  [facility]. [facility]   Comments:      Repeat colonoscopy in 5 years.   Colonoscopy  Procedure date:  03/28/2009  Findings:       Pathology:  Adenomatous polyp.        Pathology:  Hyperplastic polyp.       Colonoscopy  Procedure date:  11/18/2007  Findings:       Results: Polyp.  Pathology:  Adenomatous polyp.        Location:  [facility].     -  Date:  06/20/2010    Flu vaccine given  Date:  07/01/1999    TD booster Td   History of Present Illness: CPE 04/2010

## 2010-10-18 NOTE — Assessment & Plan Note (Signed)
Summary: COUGH,RUNNY NOSE,BODY ACHES/CLE   Vital Signs:  Patient profile:   77 year old [name] Weight:      177.[place] pounds Temp:     98.0 degrees F oral Pulse rate:   60 / minute Pulse rhythm:   regular BP sitting:   130 / 70  (left arm) Cuff size:   regular  Vitals Entered By: Selena Batten Dance CMA (AAMA) (August 29, 2010 10:12 AM) CC: Cough/RN/Body Aches   History of Present Illness: CC: cold sxs  2d h/o chills, RN, coughing productive of clear sputum, producing more sputum.  slightly more SOB as well.  Waking up at night coughing, + some night sweats.  + sore in joints.  Coughing last night, tried robitussin DM which helped some.    No other sick contacts at home.  1 ppd still.  no h/o asthma.  + h/o COPD on CXR.  No fever.  No abd pain, n/v/d, rashes.  No sinus pressure, ST, ear pain.  Current Medications (verified): 1)  Isosorbide Mononitrate Cr 30 Mg Xr24h-Tab (Isosorbide Mononitrate) .... 1/2 Tab Once Daily 2)  Metoprolol Tartrate 50 Mg Tabs (Metoprolol Tartrate) .... 1/2 Tab Two Times A Day 3)  Simvastatin 80 Mg Tabs (Simvastatin) .... 1/2 Pill Daily 4)  Aspirin 81 Mg Tbec (Aspirin) .... Take One Tablet By Mouth Daily 5)  Metformin Hcl 500 Mg Tabs (Metformin Hcl) .[name].. 1 Tab Two Times A Day 6)  Omeprazole 20 Mg Tbec (Omeprazole) .[name].. 1 Tab Once Daily 7)  Nitroglycerin 0.4 Mg Subl (Nitroglycerin) .... One Tablet Under Tongue Every 5 Minutes As Needed For Chest Pain---May Repeat Times Three 8)  Miralax  Powd (Polyethylene Glycol 3350) .[name].. 17gm in 8oz Fluid Daily As Needed Constipation 9)  Tylenol Extra Strength 500 Mg Tabs (Acetaminophen) .... Take 1-2 By Mouth Three Times A Day  X 1 Wk Then As Needed  Allergies: 1)  ! Codeine 2)  ! Flexeril  Past History:  Social History: Last updated: 08/20/2010 smoking <1ppd.  seldom EtOH, no rec drugs rare caffeine, decaf coffee. He is married.  He has 2 children.  Lives with wife.  No pets. He does dip some Grizzly.  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   He was in the [employer] for 4 years.  He gets most of his blood work at the [place].  Past Medical History: CAD, ARTERY BYPASS GRAFT/ X3. 1999 (ICD-414.04) HYPERCHOLESTEROLEMIA (ICD-272.0) ABDOMINAL AORTIC ANEURYSM, HX OF (ICD-V12.50)  ??  no mention in previous records TOBACCO ABUSE (ICD-305.1) BPH (600.00) T2DM dx  ~2008 h/o ulcers h/o L benign prostate nodule s/p eval Uro WNL (Ottelin) COPD on CXR  Review of Systems       per HPI  Physical Exam  General:  Well developed, well nourished, in no acute distress. Head:  normocephalic and atraumatic Eyes:  PERRLA/EOM intact; conjunctiva and lids normal. Ears:  dry cerumen bilaterally R occluding ear drum Nose:  pale turbinates, + discharge Mouth:  Oral mucosa and oropharynx without lesions or exudates.  Teeth in good repair.  MMM Neck:  Neck supple, no JVD. No masses, thyromegaly or abnormal cervical nodes.  no carotid bruits Lungs:  coarse breath sounds.  diminished air movement.  equal breath sounds bilaterally Heart:  Normal rate and regular rhythm. S1 and S2 normal without gallop, murmur, click, rub or other extra sounds. Pulses:  2+ rad pulses Extremities:  no pedal edema Skin:  Intact without lesions or rashes.   Impression & Recommendations:  Problem # 1:  ACUTE BRONCHITIS (ICD-466.0) in smoker with likely COPD.  cover with zpack.  albuterol for breathing.  to continue robitussin, may fill hydromet if cough not better at night time.  red flags to return discussed  His updated medication list for this problem includes:    Ventolin Hfa 108 (90 Base) Mcg/act Aers (Albuterol sulfate) .[name]... 1-2 puffs q4-6 hours as needed sob, wheezing    Hydromet 5-1.5 Mg/42ml Syrp (Hydrocodone-homatropine) ..... One teaspoon q 6 hours as needed cough, sedation precautions    Zithromax Z-pak 250 Mg Tabs (Azithromycin) ..... Use as directed  Complete Medication List: 1)  Isosorbide Mononitrate Cr 30  Mg Xr24h-tab (Isosorbide mononitrate) .... 1/2 tab once daily 2)  Metoprolol Tartrate 50 Mg Tabs (Metoprolol tartrate) .... 1/2 tab two times a day 3)  Simvastatin 80 Mg Tabs (Simvastatin) .... 1/2 pill daily 4)  Aspirin 81 Mg Tbec (Aspirin) .... Take one tablet by mouth daily 5)  Metformin Hcl 500 Mg Tabs (Metformin hcl) .[name].. 1 tab two times a day 6)  Omeprazole 20 Mg Tbec (Omeprazole) .[name].. 1 tab once daily 7)  Nitroglycerin 0.4 Mg Subl (Nitroglycerin) .... One tablet under tongue every 5 minutes as needed for chest pain---may repeat times three 8)  Miralax Powd (Polyethylene glycol 3350) .[name].. 17gm in 8oz fluid daily as needed constipation 9)  Tylenol Extra Strength 500 Mg Tabs (Acetaminophen) .... Take 1-2 by mouth three times a day  x 1 wk then as needed 10)  Ventolin Hfa 108 (90 Base) Mcg/act Aers (Albuterol sulfate) .[name].. 1-2 puffs q4-6 hours as needed sob, wheezing 11)  Hydromet 5-1.5 Mg/4ml Syrp (Hydrocodone-homatropine) .... One teaspoon q 6 hours as needed cough, sedation precautions 12)  Zithromax Z-pak 250 Mg Tabs (Azithromycin) .... Use as directed  Patient Instructions: 1)  Sounds like you have viral bronchitis but since you smoke you are at increased risk of infection. 2)  Treat with antibiotic for 5 days as well as albuterol inhaler to help breathing. 3)  Robitussin for cough, hydromet if not helping - watch out it can make you sleepy. 4)  Return if fevers >101.5, trouble breathing, worsening cough, or not improving as expected. 5)  Cut back or quit smoking!! Prescriptions: ZITHROMAX Z-PAK 250 MG TABS (AZITHROMYCIN) use as directed  #1 x 0   Entered and Authorized by:   Eustaquio Boyden  MD   Signed by:   Eustaquio Boyden  MD on 08/29/2010   Method used:   Electronically to        [facility] (retail)       [address]       [place], [place]  [id number]       Ph: [phone]       Fax: [phone]   RxID:   [id number] HYDROMET 5-1.5 MG/5ML  SYRP (HYDROCODONE-HOMATROPINE) one teaspoon q 6 hours as needed cough, sedation precautions  #100cc x 0   Entered and Authorized by:   Eustaquio Boyden  MD   Signed by:   Eustaquio Boyden  MD on 08/29/2010   Method used:   Print then Give to Patient   RxID:   [id number] VENTOLIN HFA 108 (90 BASE) MCG/ACT AERS (ALBUTEROL SULFATE) 1-2 puffs q4-6 hours as needed sob, wheezing  #1 x 3   Entered and Authorized by:   Eustaquio Boyden  MD   Signed by:   Eustaquio Boyden  MD on 08/29/2010   Method used:   Electronically to  [facility] (retail)       [address]       [place], [place]  [id number]       Ph: [phone]       Fax: [phone]   RxID:   [id number]    Orders Added: 1)  Est. Patient Level III [24401]    Current Allergies (reviewed today): ! CODEINE ! FLEXERIL

## 2010-10-18 NOTE — Assessment & Plan Note (Signed)
Summary: pain in chest on and off x 2 days/alc   Vital Signs:  Patient profile:   77 year old male Height:      70 inches Weight:      180.25 pounds BMI:     25.96 Temp:     97.8 degrees F oral Pulse rate:   68 / minute Pulse rhythm:   regular BP sitting:   132 / 50  (left arm) Cuff size:   large  Vitals Entered By: Delilah Shan CMA  Dull) (October 04, 2010 10:52 AM) CC: Pain in chest on and off x 2 days.  Refill Nitro   History of Present Illness: Had pain in L chest wall for last few days.  Intermittent.  No pain now.  It would last a few minutes.  It felt like stabbing, sharp pain that didn't radiate.  No recent trauma to explain the pain.  The pain could come on at rest.  If patient got up and walked, it didn't get worse.  Didn't have any nitro to take.  Not more short of breath than baseline, no bilateral lower extremity edema.    R cuff surgery recently done.    Allergies: 1)  ! Codeine 2)  ! Flexeril  Past History:  Past Medical History: Last updated: 08/29/2010 CAD, ARTERY BYPASS GRAFT/ X3. 1999 (ICD-414.04) HYPERCHOLESTEROLEMIA (ICD-272.0) ABDOMINAL AORTIC ANEURYSM, HX OF (ICD-V12.50)  ??  no mention in previous records TOBACCO ABUSE (ICD-305.1) BPH (600.00) T2DM dx  ~2008 h/o ulcers h/o L benign prostate nodule s/p eval Uro WNL (Ottelin) COPD on CXR  Social History: Reviewed history from 08/20/2010 and no changes required. smoking <1ppd.  seldom EtOH, no rec drugs rare caffeine, decaf coffee. He is married.  He has 2 children.  Lives with wife.  No pets. He does dip some Grizzly.  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  He was in the [employer] for 4 years.  He gets most of his blood work at the [place].  Review of Systems       See HPI.  Otherwise negative.    Physical Exam  General:  normocephalic atraumatic mucous membranes moist neck supple regular rate and rhythm clear to auscultation bilaterally w/o wheeze abdominal exam w/o  tenderness ext w/o edema chest wall not tender to palpation    Impression & Recommendations:  Problem # 1:  OTHER CHEST PAIN (ICD-786.59) Unclear source, now resolved.  I sent rx for nitro.  If used, and improved, then notify clinic.  If not improved on nitro, or if needed repeatedly, patient to go to ER.  He agrees.  I will notify cards clinic via flag.  EKG w/o acute change from prev . Orders: Prescription Created Electronically [id number])  Complete Medication List: 1)  Isosorbide Mononitrate Cr 30 Mg Xr24h-tab (Isosorbide mononitrate) .... 1/2 tab once daily 2)  Metoprolol Tartrate 50 Mg Tabs (Metoprolol tartrate) .... 1/2 tab two times a day 3)  Simvastatin 80 Mg Tabs (Simvastatin) .... 1/2 pill daily 4)  Aspirin 81 Mg Tbec (Aspirin) .... Take one tablet by mouth daily 5)  Metformin Hcl 500 Mg Tabs (Metformin hcl) .[name].. 1 tab two times a day 6)  Omeprazole 20 Mg Tbec (Omeprazole) .[name].. 1 tab once daily 7)  Nitroglycerin 0.4 Mg Subl (Nitroglycerin) .... One tablet under tongue every 5 minutes as needed for chest pain---may repeat times three 8)  Miralax Powd (Polyethylene glycol 3350) .[name].. 17gm in 8oz fluid daily as needed constipation 9)  Tylenol Extra Strength  500 Mg Tabs (Acetaminophen) .... Take 1-2 by mouth three times a day  x 1 wk then as needed 10)  Ventolin Hfa 108 (90 Base) Mcg/act Aers (Albuterol sulfate) .[name].. 1-2 puffs q4-6 hours as needed sob, wheezing  Patient Instructions: 1)  I'll send a note to cardiology.  Get the nitro filled in the meantime.  If you have pain, use the nitro.  If it gets better, then let cardiology know.  If you aren't getting better, go to the ER.  Take care.  Prescriptions: NITROGLYCERIN 0.4 MG SUBL (NITROGLYCERIN) One tablet under tongue every 5 minutes as needed for chest pain---may repeat times three  #25 x 1   Entered and Authorized by:   Crawford Givens MD   Signed by:   Crawford Givens MD on 10/04/2010   Method used:   [facility] to        [facility]   [address] (retail[facility]       [address]       [place], [place]  [address]       Ph: [phone] or [phone]       Fax: [phone]   RxID:   [id number]    Orders Added: 1)  Prescription Created Electronically [[id number]] 2)  Est. Patient Level IV [06237]    Current Allergies (reviewed today): ! CODEINE ! FLEXERIL   Appended Document: pain in chest on and off x 2 days/alc

## 2010-10-30 ENCOUNTER — Encounter: Payer: Self-pay | Admitting: Family Medicine

## 2010-10-30 ENCOUNTER — Other Ambulatory Visit: Payer: Self-pay | Admitting: Family Medicine

## 2010-10-30 ENCOUNTER — Ambulatory Visit (INDEPENDENT_AMBULATORY_CARE_PROVIDER_SITE_OTHER): Payer: MEDICARE | Admitting: Family Medicine

## 2010-10-30 DIAGNOSIS — E119 Type 2 diabetes mellitus without complications: Secondary | ICD-10-CM

## 2010-10-30 DIAGNOSIS — E7800 Pure hypercholesterolemia, unspecified: Secondary | ICD-10-CM

## 2010-10-30 DIAGNOSIS — F17200 Nicotine dependence, unspecified, uncomplicated: Secondary | ICD-10-CM

## 2010-10-30 DIAGNOSIS — K5900 Constipation, unspecified: Secondary | ICD-10-CM

## 2010-10-30 LAB — HEMOGLOBIN A1C: Hgb A1c MFr Bld: 6.6 % — ABNORMAL HIGH (ref 4.6–6.5)

## 2010-10-30 LAB — BASIC METABOLIC PANEL
BUN: 22 mg/dL (ref 6–23)
CO2: 26 mEq/L (ref 19–32)
Calcium: 9.9 mg/dL (ref 8.4–10.5)
Chloride: 106 mEq/L (ref 96–112)
Creatinine, Ser: 1.2 mg/dL (ref 0.4–1.5)
GFR: 65.18 mL/min (ref >60.00)
Glucose, Bld: 108 mg/dL — ABNORMAL HIGH (ref 70–99)
Potassium: 4.7 mEq/L (ref 3.5–5.1)
Sodium: 139 mEq/L (ref 135–145)

## 2010-10-30 LAB — MICROALBUMIN / CREATININE URINE RATIO
Creatinine,U: 154.9 mg/dL
Microalb Creat Ratio: 1.1 mg/g (ref 0.0–30.0)
Microalb, Ur: 1.7 mg/dL (ref 0.0–1.9)

## 2010-10-30 LAB — HM DIABETES FOOT EXAM

## 2010-11-07 NOTE — Assessment & Plan Note (Signed)
Summary: f/u on smoking [facility]   Vital Signs:  Patient profile:   77 year old male Weight:      179 pounds Temp:     98.1 degrees F oral Pulse rate:   60 / minute Pulse rhythm:   regular BP sitting:   136 / 70  (left arm) Cuff size:   regular  Vitals Entered By: Selena Batten Dance CMA (AAMA) (October 30, 2010 8:03 AM) CC: Follow up   History of Present Illness: CC: f/u issues  seen last month with CP, now resolved.  1. DM - doing well.  ran out of strips and waiting on [facility] to send some more.  No paresthesais, CP/tightness, SOB.  2. HLD - tolerating simvastatin.  no myalgias  3. smoking - about 1/2 ppd smoking.  precontemplative.  did quit cold [place] several years ago.  likes habit of smoking, enjoys cigarette after meals.  never been on chantix/wellbutrin.  Used patch in past, didn't help.  has gum at home but not using.    4. constipation - goes every other day.  hard stools.  not drinking much water.  off miralax.  feels doing fine with this.  Current Medications (verified): 1)  Isosorbide Mononitrate Cr 30 Mg Xr24h-Tab (Isosorbide Mononitrate) .... 1/2 Tab Once Daily 2)  Metoprolol Tartrate 50 Mg Tabs (Metoprolol Tartrate) .... 1/2 Tab Two Times A Day 3)  Simvastatin 80 Mg Tabs (Simvastatin) .... 1/2 Pill Daily 4)  Aspirin 81 Mg Tbec (Aspirin) .... Take One Tablet By Mouth Daily 5)  Metformin Hcl 500 Mg Tabs (Metformin Hcl) .[name].. 1 Tab Two Times A Day 6)  Omeprazole 20 Mg Tbec (Omeprazole) .[name].. 1 Tab Once Daily 7)  Nitroglycerin 0.4 Mg Subl (Nitroglycerin) .... One Tablet Under Tongue Every 5 Minutes As Needed For Chest Pain---May Repeat Times Three 8)  Miralax  Powd (Polyethylene Glycol 3350) .[name].. 17gm in 8oz Fluid Daily As Needed Constipation 9)  Tylenol Extra Strength 500 Mg Tabs (Acetaminophen) .... Take 1-2 By Mouth Three Times A Day  X 1 Wk Then As Needed 10)  Ventolin Hfa 108 (90 Base) Mcg/act Aers (Albuterol Sulfate) .[name].. 1-2 Puffs Q4-6 Hours As Needed Sob,  Wheezing  Allergies: 1)  ! Codeine 2)  ! Flexeril  Past History:  Past Medical History: Last updated: 08/29/2010 CAD, ARTERY BYPASS GRAFT/ X3. 1999 (ICD-414.04) HYPERCHOLESTEROLEMIA (ICD-272.0) ABDOMINAL AORTIC ANEURYSM, HX OF (ICD-V12.50)  ??  no mention in previous records TOBACCO ABUSE (ICD-305.1) BPH (600.00) T2DM dx  ~2008 h/o ulcers h/o L benign prostate nodule s/p eval Uro WNL (Ottelin) COPD on CXR  Social History: Last updated: 10/04/2010 smoking <1ppd.  seldom EtOH, no rec drugs rare caffeine, decaf coffee. He is married.  He has 2 children.  Lives with wife.  No pets. He does dip some Grizzly.  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  He was in the [employer] for 4 years.  He gets most of his blood work at the [place].  Review of Systems       per HPI  Physical Exam  General:  Well-developed,well-nourished,in no acute distress; alert,appropriate and cooperative throughout examination Head:  normocephalic and atraumatic Eyes:  PERRLA/EOM intact; conjunctiva and lids normal. Mouth:  Oral mucosa and oropharynx without lesions or exudates.  Teeth in good repair.  MMM Neck:  Neck supple, no JVD. No masses, thyromegaly or abnormal cervical nodes.  no carotid bruits Lungs:  coarse breath sounds.  diminished air movement.  equal breath sounds bilaterally Heart:  Normal rate and regular rhythm. S1 and S2 normal without gallop, murmur, click, rub or other extra sounds. Pulses:  2+ rad/DP/PT pulses Extremities:  no pedal edema  Diabetes Management Exam:    Foot Exam (with socks and/or shoes not present):       Sensory-Pinprick/Light touch:          Left medial foot (L-4): normal          Left dorsal foot (L-5): normal          Left lateral foot (S-1): normal          Right medial foot (L-4): normal          Right dorsal foot (L-5): normal          Right lateral foot (S-1): normal       Sensory-Monofilament:          Left foot: normal          Right  foot: normal       Inspection:          Left foot: normal          Right foot: normal       Nails:          Left foot: normal          Right foot: normal   Impression & Recommendations:  Problem # 1:  DIABETES MELLITUS, TYPE II (ICD-250.00) Assessment Improved blood work today.  tolerating med well, good control.  His updated medication list for this problem includes:    Aspirin 81 Mg Tbec (Aspirin) .[name]... Take one tablet by mouth daily    Metformin Hcl 500 Mg Tabs (Metformin hcl) .[name]... 1 tab two times a day  Orders: TLB-A1C / Hgb A1C (Glycohemoglobin) (83036-A1C) TLB-Microalbumin/Creat Ratio, Urine (82043-MALB) Venipuncture ([address]) TLB-BMP (Basic Metabolic Panel-BMET) (80048-METABOL)  Labs Reviewed: Creat: 1.17 (05/01/2010)    Reviewed HgBA1c results: 6.4 (05/01/2010)  6.7 (11/19/2006)  Problem # 2:  HYPERCHOLESTEROLEMIA (ICD-272.0) Assessment: Improved continue med.  His updated medication list for this problem includes:    Simvastatin 80 Mg Tabs (Simvastatin) .[name]... 1/2 pill daily  Labs Reviewed: SGOT: 20 (05/01/2010)   SGPT: 19 (05/01/2010)   HDL:33 (05/01/2010)  LDL:65 (05/01/2010)  Chol:127 (05/01/2010)  Trig:145 (05/01/2010)  Problem # 3:  TOBACCO ABUSE (ICD-305.1)  Encouraged smoking cessation  Problem # 4:  CONSTIPATION (ICD-564.00)  some improved however not daily soft stools, rec increase water.  His updated medication list for this problem includes:    Miralax Powd (Polyethylene glycol 3350) .Marland KitchenMarland KitchenMarland KitchenMarland Kitchen 17gm in 8oz fluid daily as needed constipation  Discussed dietary fiber measures and increased water intake.   Complete Medication List: 1)  Isosorbide Mononitrate Cr 30 Mg Xr24h-tab (Isosorbide mononitrate) .... 1/2 tab once daily 2)  Metoprolol Tartrate 50 Mg Tabs (Metoprolol tartrate) .... 1/2 tab two times a day 3)  Simvastatin 80 Mg Tabs (Simvastatin) .... 1/2 pill daily 4)  Aspirin 81 Mg Tbec (Aspirin) .... Take one tablet by mouth daily 5)   Metformin Hcl 500 Mg Tabs (Metformin hcl) .[name].. 1 tab two times a day 6)  Omeprazole 20 Mg Tbec (Omeprazole) .[name].. 1 tab once daily 7)  Nitroglycerin 0.4 Mg Subl (Nitroglycerin) .... One tablet under tongue every 5 minutes as needed for chest pain---may repeat times three 8)  Miralax Powd (Polyethylene glycol 3350) .[name].. 17gm in 8oz fluid daily as needed constipation 9)  Tylenol Extra Strength 500 Mg Tabs (Acetaminophen) .... Take 1-2 by mouth three times a day  x 1 wk then as needed 10)  Ventolin Hfa 108 (90 Base) Mcg/act Aers (Albuterol sulfate) .[name].. 1-2 puffs q4-6 hours as needed sob, wheezing  Patient Instructions: 1)  Return to see me in 6 months. 2)  Increase water in diet. 3)  Blood work today. 4)  cut back or quit smoking. 5)  Call clinic with quesitons. Good to see you today.   Orders Added: 1)  TLB-A1C / Hgb A1C (Glycohemoglobin) [83036-A1C] 2)  TLB-Microalbumin/Creat Ratio, Urine [82043-MALB] 3)  Venipuncture [36415] 4)  TLB-BMP (Basic Metabolic Panel-BMET) [80048-METABOL] 5)  Est. Patient Level IV [[address]]    Current Allergies (reviewed today): ! CODEINE ! FLEXERIL

## 2010-11-29 ENCOUNTER — Encounter: Payer: Self-pay | Admitting: Family Medicine

## 2010-12-26 ENCOUNTER — Encounter: Payer: Self-pay | Admitting: Family Medicine

## 2010-12-26 ENCOUNTER — Ambulatory Visit (INDEPENDENT_AMBULATORY_CARE_PROVIDER_SITE_OTHER): Payer: MEDICARE | Admitting: Family Medicine

## 2010-12-26 VITALS — BP 120/70 | HR 55 | Temp 98.7°F | Ht 65.0 in

## 2010-12-26 DIAGNOSIS — J019 Acute sinusitis, unspecified: Secondary | ICD-10-CM

## 2010-12-26 DIAGNOSIS — J449 Chronic obstructive pulmonary disease, unspecified: Secondary | ICD-10-CM

## 2010-12-26 DIAGNOSIS — F17200 Nicotine dependence, unspecified, uncomplicated: Secondary | ICD-10-CM

## 2010-12-26 MED ORDER — AMOXICILLIN 500 MG PO CAPS: 1000.0000 mg | ORAL_CAPSULE | Freq: Two times a day (BID) | ORAL | Status: DC

## 2010-12-26 NOTE — Patient Instructions (Signed)
Delsym  Guaifenesin (Plain Robitussin or Mucinex are the same) For cough and phlegm

## 2010-12-26 NOTE — Progress Notes (Signed)
77 yo with multiple comorbidities, CAD s/p CABG, COPD, tobacco abuse presents with 1 week of worsening sinus pain and cough prod of sputum.  About a week last week. Coughing at night some Headache across his sinuses.   Runny nose Throat a little scratchy  coughiing up clear sputum A little harder to breathe and coughing at night  ROS: GEN: Acute illness details above GI: Tolerating PO intake GU: maintaining adequate hydration and urination Pulm: No SOB Interactive and getting along well at home.  Otherwise, ROS is as per the HPI.  The PMH, PSH, Social History, Family History, Medications, and allergies have been reviewed in [facility], and have been updated if relevant.  GEN: A and O x 3. WDWN. NAD.    ENT: Nose clear, ext NML.  No LAD.  No JVD.  TM's clear. Oropharynx clear. Sinuses mildly ttp, frontal PULM: Normal WOB, no distress. No crackles, wheezes, rhonchi. CV: RRR, no M/G/R, No rubs, No JVD.   ABD: S, NT, ND, + BS. No rebound. No guarding. No HSM.   EXT: warm and well-perfused, No c/c/e. PSYCH: Pleasant and conversant.  A/P: 1. Sinusitis, new, rx with Amox, Refer to the patient instructions sections for details of plan shared with patient. 2. COPD, mild exac, albuterol prn

## 2011-01-02 ENCOUNTER — Ambulatory Visit (INDEPENDENT_AMBULATORY_CARE_PROVIDER_SITE_OTHER): Payer: Medicare Other | Admitting: Family Medicine

## 2011-01-02 ENCOUNTER — Encounter: Payer: Self-pay | Admitting: Family Medicine

## 2011-01-02 VITALS — BP 112/60 | HR 56 | Temp 97.9°F | Wt 182.0 lb

## 2011-01-02 DIAGNOSIS — J329 Chronic sinusitis, unspecified: Secondary | ICD-10-CM

## 2011-01-02 DIAGNOSIS — J019 Acute sinusitis, unspecified: Secondary | ICD-10-CM | POA: Insufficient documentation

## 2011-01-02 MED ORDER — AZITHROMYCIN 250 MG PO TABS: ORAL_TABLET | ORAL | Status: AC

## 2011-01-02 MED ORDER — HYDROCODONE-HOMATROPINE 5-1.5 MG PO TABS: 5.0000 mL | ORAL_TABLET | Freq: Every evening | ORAL | Status: DC | PRN

## 2011-01-02 NOTE — Progress Notes (Signed)
  Subjective:    Patient ID: Troy [name], male    DOB: 02-Jul-1933, 77 y.o.   MRN: [id number]  HPI CC: ? Sinus infection  Seen 12/26/2010 with concern for sinusitis, treated with amoxicillin, not improving.  Wants to feel better for ship reunion.  2wk h/o frontal headache, sinus congestion, cough.  Has been taking tylenol and OTC cough syrup.  Waking up at night with cough and sweating.  + chills.  Cough productive of cloudy sputum.  Cough more than normal.  Not more SOB than normal.  No fever, abd pain, n/v/d, rashes, myalgias, arthralgias.  No ear pain or tooth pain.  zpack has worked well for patient in past.  No h/o asthma, seasonal allergies.  Possible mild COPD.  Review of Systems Per HPI    Objective:   Physical Exam  Constitutional: He appears well-developed and well-nourished. No distress.  HENT:  Head: Normocephalic and atraumatic.  Right Ear: Tympanic membrane, external ear and ear canal normal.  Left Ear: Tympanic membrane, external ear and ear canal normal.  Nose: No mucosal edema or rhinorrhea. Right sinus exhibits no maxillary sinus tenderness and no frontal sinus tenderness. Left sinus exhibits no maxillary sinus tenderness and no frontal sinus tenderness.  Mouth/Throat: Oropharynx is clear and moist. No oropharyngeal exudate.       Some nasal inflammation, pallor  Eyes: Conjunctivae and EOM are normal. Pupils are equal, round, and reactive to light. No scleral icterus.  Neck: Normal range of motion. Neck supple. No thyromegaly present.  Cardiovascular: Normal rate, regular rhythm, normal heart sounds and intact distal pulses.   No murmur heard. Pulmonary/Chest: Effort normal. No respiratory distress. He has no wheezes. He has no rales.       Mild coarse breath sounds  Lymphadenopathy:    He has no cervical adenopathy.  Skin: Skin is warm and dry. No rash noted.          Assessment & Plan:

## 2011-01-02 NOTE — Assessment & Plan Note (Signed)
Reviewed previous note.  Started on amox 1gm bid.  Per pt after 1 wk no improvement, states zpack has worked well in past. Stop amox, change to zpack.   Use mucinex and nasal saline. Return if fevers, or not improving as expected. Smoker and mild COPD but doubt COPD exac (no SOB).

## 2011-01-02 NOTE — Patient Instructions (Signed)
You have a sinus infection. Take medicine as prescribed: zpack.   Start nasal saline spray. Cough syrup at night time. Push fluids and plenty of rest. May use simple mucinex with plenty of fluid to help mobilize mucous. Return if fever >101.5, trouble opening/closing mouth, difficulty swallowing, or worsening.

## 2011-01-04 ENCOUNTER — Telehealth: Payer: Self-pay | Admitting: *Deleted

## 2011-01-04 ENCOUNTER — Other Ambulatory Visit: Payer: Self-pay | Admitting: Family Medicine

## 2011-01-04 NOTE — Telephone Encounter (Signed)
Pt is angry that he was given hydrocodone when he says you told him he is allergic to it.  I told him that codeine is on his allergy list, and they are not the same thing.  I asked him what type of reaction did he have to the codeine and he said it made him sick.  I told him that is a side effect and not a true allergy.  He says he is having chills and nausea with the hydrocodone.  He is asking that he be called back ASAP.

## 2011-01-04 NOTE — Telephone Encounter (Signed)
called patient - did remind him we discussed this at [facility].  i'm sorry he had reaction to hydrocodone.  Have added to allergy list and removed med from list.  Pt feeling better off syrup.

## 2011-01-29 NOTE — Assessment & Plan Note (Signed)
[facility]                            CARDIOLOGY OFFICE NOTE   NAME:Troy [name], Troy [name]                     MRN:          [id number]  DATE:05/25/2008                            DOB:          [birth year]    REFERRING PHYSICIAN:  Quita Skye. Artis Flock, [name]   HISTORY OF PRESENT ILLNESS:  I was asked by Dr. Bradd Canary to consult  on Jawanza Zambito for his coronary artery disease.   He is a former patient of Dr. Graceann Congress, but has not been seen by  Jomarie Longs since 2005.  At that time, he had had a cardiac catheterization  on June 20, 2004 by Dr. Riley Kill, which showed well-preserved LV  function, patency of the left internal mammary graft to the LAD, patency  of the vein graft to distal right and diagonal branch, moderate  progression of disease into a posterior lateral segment of the right  coronary artery and moderate progression of disease in the first obtuse  marginal branch.  He was treated medically.  He had had bypass surgery  in 1999 by Dr. Cornelius Moras.   He currently is very active at age 77.  He enjoys [place] with his  grandchildren.  He still water skis!   He is having no angina, no ischemic symptoms.  He has no orthopnea, PND,  peripheral edema, palpitations, presyncope, or syncope.   PAST MEDICAL HISTORY:   CURRENT MEDICATIONS:  1. Amlodipine 2.5 mg a day.  2. Omeprazole 20 mg a day.  3. Metformin 500 b.i.d.  4. Aspirin 81 mg a day.  5. Simvastatin 80 mg at bedtime.  6. Metoprolol tartrate 50 b.i.d.  7. Isosorbide 15 mg daily.  8. He carries NitroQuick p.r.n.   ALLERGIES:  He is intolerance to CODEINE.   SOCIAL HISTORY:  He is married.  He has 2 children.  He still smokes 3  cigarettes a day.  He does dip some Grizzly.  He occasionally has an  alcoholic beverage, but very infrequently.  He drinks very little  caffeine.  He is very active.   He is retired.  He was in the [employer] for 4 years.  He gets his blood work  at the [place].   FAMILY  HISTORY:  Negative for premature coronary artery disease.   REVIEW OF SYSTEMS:  Other than the HPI, he has had an ulcer in the past.  He is inactive.  He denies any melena, hematochezia, or blood loss.   PHYSICAL EXAMINATION:  GENERAL:  A very pleasant gentleman, in no acute  distress.  VITAL SIGNS:  His blood pressure is 140/77, his pulse is 63 and regular.  His weight is 183.  He is 5 foot 9.  HEENT:  Normocephalic and atraumatic.  PERRLA.  Extraocular movements  intact.  Sclerae are slightly injected.  Facial symmetry is normal.  Dentition is satisfactory.  Oral mucosa unremarkable.  NECK:  Carotids upstrokes are equal bilaterally without bruits, no JVD.  Thyroid is not enlarged.  Trachea is midline.  LUNGS:  Clear except for some inspiratory and expiratory rhonchi.  HEART:  Reveals a nondisplaced  PMI.  Normal S1 and S2, split S2.  ABDOMEN:  Soft.  No pulsatile mass.  No organomegaly.  EXTREMITIES:  No cyanosis, clubbing, or edema.  Pulses are intact.  NEURO:  Intact.  SKIN:  A few ecchymoses.   His electrocardiogram demonstrates sinus bradycardia at rate of 55 beats  per minute.  There are no changes.   ASSESSMENT AND PLAN:  Mr. Tasker is doing well.  It has been over 4  years since he has had an objective assessment of his coronary artery  disease.  I have arranged for him to have an adenosine Myoview.  I have  made no change in his medications.  He will continue to have his blood  drawn with the [place] and Dr. Artis Flock will see him for his primary care.  We  will see him back in a year.     Thomas C. Daleen Squibb, MD, [facility]  Electronically Signed    TCW/MedQ  DD: 05/25/2008  DT: 05/26/2008  Job #: [id number]   cc:   Quita Skye. Artis Flock, [name]

## 2011-02-01 NOTE — Discharge Summary (Signed)
NAMEPHARRELL, LEDFORD NO.:  [id number]   MEDICAL RECORD NO.:  [id number]          PATIENT TYPE:  INP   LOCATION:  3743                         FACILITY:  [facility]   PHYSICIAN:  Carole Binning, [name] LHCDATE OF BIRTH:  Jan 07, 1933   DATE OF ADMISSION:  06/20/2004  DATE OF DISCHARGE:  06/21/2004                           DISCHARGE SUMMARY - REFERRING   PROCEDURES:  Cardiac catheterization June 20, 2004.   REASON FOR ADMISSION:  Mr. Magowan is a 77 year old male, status post CABG  in 1999, followed by Dr. Sarajane Jews, who presented to the emergency room  with symptoms worrisome for unstable angina pectoris. Please refer to  dictated admission note for details.   LABORATORY DATA:  Normal serial cardiac markers. Normal CBC on admission.  Normal electrolytes and renal function. Elevated glucose 111 on admission.  Elevated alkaline phosphatase 130, otherwise normal liver enzymes. TSH 2.3.  Lipid profile pending.   Admission chest x-ray:  No acute disease.   HOSPITAL COURSE:  Following stabilization in the emergency room with  aspirin, beta-blocker, intravenous nitroglycerin, and heparin, arrangements  were made for patient to undergo same day cardiac catheterization. Serial  cardiac markers returned all within normal limits.   Cardiac catheterization, performed by Dr. Shawnie Pons (see report for  full details), revealed widely patent LIMA graft, SVG-RCA graft, and 30% mid  SVG-diagonal disease. Dr. Riley Kill did note, however, a 75-80% ostial OM-1  lesion which was reviewed with colleagues. Recommendation was to treat this  lesion medically and to follow-up with an outpatient Cardiolite.   The native coronary arteries otherwise notable for normal left anterior  descending, 20% mid circumflex, and 70-75% distal RCA after the anastomosis.  Left ventricular function was normal.   Patient was kept for overnight observation, started on low dose Imdur (15  mg),  and cleared for discharge the following morning in hemodynamically  stable condition, by Dr. Sarajane Jews. There were no noted complications of  right groin incision site.   MEDICATION ADJUSTMENTS THIS ADMISSION:  Imdur 15 mg daily, Toprol XL 50 mg  daily.   DISCHARGE MEDICATIONS:  1.  Coated aspirin 325 mg daily.  2.  Zocor 40 mg q.h.s.  3.  Prilosec 30 mg daily.  4.  Imdur 15 mg daily (new).  5.  Toprol XL 50 mg daily (new).  6.  Nitrostat 0.4 mg p.r.n.   INSTRUCTIONS:  Refrain from any heavy lifting (greater than 10 pounds) x1  week; low fat/cholesterol diet; call the office if there is any  swelling/bleeding in the groin.   Patient strongly encouraged to stop smoking tobacco.   Patient is scheduled to follow-up with Dr. Sarajane Jews on Tuesday,  July 24, 2004 at 4:30 p.m.   DISCHARGE DIAGNOSES:  1.  Unstable angina pectoris.      1.  Normal serial cardiac markers.      2.  Patent bypass graft with 75-80% ostial obtuse marginal-1 lesion -          medical therapy recommended - cardiac catheterization  June 20, 2004.  c.  Normal left ventricular  function.  d.  Status post 3-vessel coronary artery bypass graft in 1999.  1.  Dyslipidemia.  2.  Tobacco.       GS/MEDQ  D:  06/21/2004  T:  06/21/2004  Job:  [id number]   cc:   Vale Haven. Andrey Campanile, [name]  [address]  [place]  [place] [address]  Fax: [phone]

## 2011-02-01 NOTE — Discharge Summary (Signed)
NAMEMARKHI, KLECKNER NO.:  [id number]   MEDICAL RECORD NO.:  [id number]          PATIENT TYPE:  INP   LOCATION:  2007                         FACILITY:  [facility]   PHYSICIAN:  Jesse Sans. Wall, [name]   DATE OF BIRTH:  03-24-1933   DATE OF ADMISSION:  12/03/2004  DATE OF DISCHARGE:                           DISCHARGE SUMMARY - REFERRING   ADDENDUM:   DISCHARGE DIAGNOSES:  1.  Mild thrombocytopenia.      GS/MEDQ  D:  12/05/2004  T:  12/05/2004  Job:  [id number]

## 2011-02-01 NOTE — Op Note (Signed)
   NAME:  Troy [name], Troy [name]                        ACCOUNT NO.:  [id number]   MEDICAL RECORD NO.:  [id number]                   PATIENT TYPE:  AMB   LOCATION:  ENDO                                 FACILITY:  [facility]   PHYSICIAN:  Petra Kuba, [name]                 DATE OF BIRTH:  [birth year]   DATE OF PROCEDURE:  10/19/2002  DATE OF DISCHARGE:                                 OPERATIVE REPORT   PROCEDURE:  Colonoscopy.   INDICATIONS FOR PROCEDURE:  A patient with a family history of colon cancer  due for repeat screening.   Consent was signed after risks, benefits, methods, and options were  thoroughly discussed in the office.   MEDICINES USED:  Demerol 100, Versed 10.   DESCRIPTION OF PROCEDURE:  Rectal inspection was pertinent for external  hemorrhoids. Digital exam was negative. The video colonoscope was inserted,  easily advanced to the leve of the hepatic flexure. The patient was rolled  from his side to his back and with abdominal pressure easily able to be  advanced to the cecum which was identified by the appendiceal orifice and  the ileocecal valve. No significant abnormality was seen on insertion. The  scope was slowly withdrawn. The prep was fairly adequate, did require a  moderate amount of washing and suctioning for adequate visualization.  On  slow withdrawal through the colon other than some rare tiny left sided  probable hyperplastic appearing polyps which were cold biopsied, no obvious  other abnormalities were seen. Once back in the rectum, the scope was  retroflexed pertinent for some internal hemorrhoids. The scope was  straightened and readvanced a short ways up to the sigmoid, air was  suctioned, scope removed. The patient tolerated the procedure well. There  was no obvious or immediate complications.   ENDOSCOPIC DIAGNOSIS:  1. Internal and external hemorrhoids.  2. Rare tiny left sided hyperplastic appearing polyps cold biopsied.  3. Otherwise within normal  limits to the cecum.   PLAN:  Await pathology. Would probably repeat screening in five years if  doing well medically. Happy to see back p.r.n., otherwise, return care to  Dr. Andrey Campanile with the customary health care maintenance to include yearly  rectals and guaiacs.                                               Petra Kuba, [name]    MEM/MEDQ  D:  10/19/2002  T:  10/19/2002  Job:  [id number]   cc:   Vale Haven. Andrey Campanile, [name]  [address]  [place]  [place] [address]  Fax: [phone]

## 2011-02-01 NOTE — Letter (Signed)
October 28, 2006    Troy [name]. Troy [name], [name]  [address]  [place], [place] [address]   RE:  Troy [name]  MRN:  [id number]  /  DOB:  03-26-1933   Dear Troy [name],   It was a pleasure to see your nice patient, Troy [name] for followup  on October 28, 2006.  As you know he is a very pleasant 77 year old  white male, 8-1/2 years postoperative CABG by Dr. Barry [name].   The patient continues to get along quite well. he is asymptomatic and  he remains quite active.  He had a catheterization in February 2005,  which revealed continued patency of the LIMA to the LAD, and continued  patency of the SVG to the right and diagonal.  there had been modest  progression of disease in the posterolateral segment of the RCA and in  the first obtuse marginal.  The saphenous vein graft to the diagonal was  separate from the saphenous vein graft to the RCA.   MEDICATIONS:  1. Isordil.  2. Simvastatin 80 mg.  3. Metoprolol 12.5 mg b.i.d.  4. Aspirin 81 mg.  5. Metformin 500 mg b.i.d.   PHYSICAL EXAMINATION:  Blood pressure 159/79, pulse 72 normal sinus  rhythm.  GENERAL APPEARANCE:  Normal.  JVP is not elevated, carotid pulses palpable equally without bruits.  LUNGS:  Clear.  CARDIAC:  Reveals no murmur, gallop, or rub.  EXTREMITIES:  Reveal no edema.   He had previous abdominal ultrasound revealing abdominal aneurysm.   DIAGNOSES:  As above.  I am concerned that his blood pressure is not  well controlled and would suggest he try amlodipine 5 mg daily.  He is  to see you in the near future for followup.  I will be happy to see him  again in 3 months or any other time you so desire.    Sincerely,      E. Troy Congress, MD, [facility]  Electronically Signed    EJL/MedQ  DD: 10/28/2006  DT: 10/28/2006  Job #: [id number]

## 2011-02-01 NOTE — Cardiovascular Report (Signed)
[name], WHITTLEY NO.:  [id number]   MEDICAL RECORD NO.:  [id number]          PATIENT TYPE:  INP   LOCATION:  3743                         FACILITY:  [facility]   PHYSICIAN:  Troy [name], [name] [facility] OF BIRTH:  05/29/1933   DATE OF PROCEDURE:  06/20/2004  DATE OF DISCHARGE:                              CARDIAC CATHETERIZATION   INDICATIONS FOR PROCEDURE:  Troy [name] is a 77 year old gentleman who has  previously had stenting of the LAD.  He developed early restenosis in the  stent and subsequently underwent revascularization surgery in 1999.  Unfortunately, he has continued to smoke.  He now presents with an episode  of chest pain awakening him and occurring in the early morning hours.  It  has some typical but atypical features, as well.  Enzymes were negative.  Based on this, he was seen by Dr. Gerri [name] and set up for cardiac  catheterization.   PROCEDURE:  1.  Left heart catheterization.  2.  Selective coronary arteriography.  3.  Selective left ventriculography.  4.  Saphenous vein graft angiography x 2.  5.  Selective left internal mammary artery angiography x 1.   DESCRIPTION OF PROCEDURE:  The patient was brought to the catheterization  laboratory and prepped and draped in the usual fashion.  Through an anterior  puncture, the right femoral artery was easily entered.  A 6 French sheath  was placed using left and right coronary arteries obtaining multiple  angiographic projections.  Vein graft angiography and internal mammary  angiography were obtained, as well.  Central aortic and left ventricular  pressure were measured from the pigtail.  Ventriculography was performed in  the RAO projection.  All catheters were subsequently removed.  I asked Dr.  Loraine Leriche Pulsipher to come to the laboratory and we reviewed the films in  detail.  I then discussed the case with the patient and subsequently  reviewed the films with the patient's family in the image  review area.  He  was taken to the holding area for direct manual hemostasis.   PRESSURE DATA:  1.  Central aortic pressure 125/71, mean 93.  2.  Left ventricular 127/15.  3.  No gradient on pull back across the aortic valve.   ANGIOGRAPHIC DATA:  1.  The left main coronary artery has mild luminal irregularities but no      significant focal areas of stenosis.  2.  Left anterior descending  artery is totally occluded after a diagonal.      The diagonal, itself, has about 80% mid narrowing.  3.  The internal mammary to the distal LAD is widely patent with some mild      luminal irregularity distally.  4.  The saphenous vein graft to the diagonal is widely patent.  There is a      valve in the middle of the graft.  The vessel, itself, is widely patent.  5.  The circumflex provides three marginal branches, the first marginal      branch has about a 75-80% ostial stenosis and a moderate size vessel.      Just  below this, there is perhaps, 20-30% narrowing in the AV circumflex      and 20% narrowing in the second marginal branch.  The third marginal      branch courses out in the posterolateral segment.  6.  The right coronary artery is diffusely diseased throughout with slow      antegrade filling and competitive filling distally.  7.  The vein graft to the distal right is intact.  Distal to this and distal      to the PDA take off is about a 70-75% area of focal eccentric stenosis      leading into a smaller posterolateral system.  8.  Ventriculography in the RAO projection reveals vigorous global systolic      function, no segmental abnormalities were identified.   IMPRESSION:  1.  Well preserved left ventricular function.  2.  Continued patency of the internal mammary to the left anterior      descending.  3.  Continued patency of the saphenous vein graft to the distal right and      the diagonal branch.  4.  Modest progression of disease into the posterolateral segment of the       right coronary artery.  5.  Moderate progression of disease in the first obtuse marginal branch.   DISPOSITION:  Whether or not the pain is ischemic is difficult to be  certain.  Enzymes are so far negative.  The ostium of the circumflex could  be the culprit.  Nonetheless, it is not ideal for percutaneous intervention  and would have to be approached likely with a cutting balloon.  It would not  be favorable for stenting.  With a prior history of restenosis, one would  worry about this as a late outcome.  Current medical treatment is  recommended.  We will probably do a Cardiolite study for further evaluation  to guide further therapy.       TDS/MEDQ  D:  06/20/2004  T:  06/20/2004  Job:  [id number]   cc:   Vale Haven. Andrey Campanile, [name]  [address]  [place]  [place] [address]  Fax: [phone]   E. Graceann Congress, [name]

## 2011-02-01 NOTE — Cardiovascular Report (Signed)
Troy [name], REITTER NO.:  [id number]   MEDICAL RECORD NO.:  [id number]          PATIENT TYPE:  INP   LOCATION:  3743                         FACILITY:  [facility]   PHYSICIAN:  Arturo Morton. Riley Kill, [name] [facility] OF BIRTH:  [birth year]   DATE OF PROCEDURE:  06/20/2004  DATE OF DISCHARGE:                              CARDIAC CATHETERIZATION   ADDENDUM:  The study also demonstrates about 30% narrowing in the saphenous  vein graft to the diagonal branch on review.  This does not appear to be  high grade.       TDS/MEDQ  D:  06/20/2004  T:  06/20/2004  Job:  [id number]

## 2011-02-01 NOTE — H&P (Signed)
NAME:  RYMAN, RATHGEBER NO.:  [id number]   MEDICAL RECORD NO.:  [id number]          PATIENT TYPE:  INP   LOCATION:  1824                         FACILITY:  [facility]   PHYSICIAN:  Carole Binning, [name] [facility] OF BIRTH:  12-12-1932   DATE OF ADMISSION:  06/20/2004  DATE OF DISCHARGE:                                HISTORY & PHYSICAL   REASON FOR ADMISSION:  The patient is a 77 year old male, with known  coronary artery disease status post previous percutaneous interventions and  subsequent coronary artery bypass surgery here at [facility], 1999,  who now presents to the emergency room with symptoms suggestive of unstable  angina pectoris.   The patient denies undergoing any repeat cardiac catheterizations since his  bypass surgery.  He was, however, admitted here at [facility] in 2002 with  chest pain; serial cardiac markers were negative for myocardial infarction  and an adenosine Cardiolite showed normal perfusion and preserved ejection  fraction.   The patient has multiple cardiac risk factors, notable for long-standing  tobacco smoking, hypercholesterolemia, a family history, and age.   Over the past two months or so, the patient has noted easy fatigability and  dyspnea with minimal exertion.  However, he denies any frank orthopnea,  paroxysmal nocturnal dyspnea or pedal edema.  Since last week, however, the  patient has also noted onset of left upper chest discomfort, rated at a 2 on  a scale of 1/10 and with occasional radiation down to the left elbow.  This  typically occurs after awakening in the morning, and can last several hours  in duration.  He denies any associated diaphoresis or nausea with the  discomfort.  Over this past week, he has noted increase in the frequency of  the episodes as well as the intensity.  He has not sought any relief for  this.  However, after having recurrent chest discomfort after prolonged  walking today, he drove  to his primary care physician who then took him  directly to the emergency room.   On arrival, the patient reports having some residual discomfort.  Electrocardiogram shows flattening of the ST segments in the inferior leads,  but no acute changes.  Laboratory data is pending.  Of note, the patient  sites similarity of these symptoms with those which preceded his  percutaneous interventions, although not as intense.   ALLERGIES:  Codeine.   MEDICATIONS PRIOR TO ADMISSION:  1.  Coated aspirin 325 daily.  2.  Zocor 40 daily.  3.  Prilosec.   PAST MEDICAL HISTORY:  1.  Coronary artery disease (as described above).  2.  Hypercholesterolemia.  3.  Status post hemorrhoidectomy.  4.  Deviated nasal septum surgery.  5.  Left shoulder arthroscopic surgery 2004.   SOCIAL HISTORY:  The patient lives here in [place] with his wife.  He has  two grown children.  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  He has  been smoking on average of one pack per day since the age of 26.  He drinks  an occasional beer.   FAMILY HISTORY:  Mother  deceased in her 35s, with known coronary artery  disease.  Father deceased at age 74, history of myocardial infarction in his  49s; status post CABG.  Brother age 57, with known coronary artery  disease/status post stenting.   REVIEW OF SYSTEMS:  Denies any recent evidence of upper or lower GI  bleeding, denies any active symptoms of reflux disease.  Otherwise, as noted  in HPI.  Remaining systems negative.   PHYSICAL EXAMINATION:  Blood pressure 139/72 on admission, pulse 64,  respirations 20, temperature 98.2, SCO2 96% (room air).  This is a 70-year-  old male in no apparent distress.  HEENT:  Normocephalic, atraumatic.  Neck:  Bilateral carotid pulses without bruits.  Lungs:  Clear to auscultation in  all fields.  Heart:  Regular rate and rhythm (S1, S2).  No murmurs, rubs or  gallops.  Abdomen:  Protuberant, nontender.  Intact bowel sounds without   bruits.  Extremities:  Presents with bilateral pulses with soft, bilateral  bruits.  Intact distal pulses.  No edema.  Neuro:  No focal deficit.   Chest x-ray:  Pending.   Electrocardiogram:  Normal sinus rhythm at 70 beats per minute with a left  axis deviation and ST flattening in leads 2, 3, and aVF.   LABORATORY DATA:  Pending.   IMPRESSION:  1.  Unstable angina pectoris.  2.  Coronary artery disease.      1.  Status post percutaneous intervention; CABG in 1999.  3.  Multiple cardiac risk factors.      1.  Tobacco.      2.  Hypercholesterolemia.      3.  Family history.      4.  Age.  4.  Probable peripheral vascular disease.      1.  Bilateral femoral bruits.   PLAN:  The patient will be admitted to telemetry for management of unstable  angina pectoris.  We will continue aspirin, start Lopressor 25 b.i.d., and  add intravenous nitroglycerin and heparin.  If cardiac markers are abnormal,  consideration will be given to the addition of 2B, 3A inhibitor.   RECOMMENDATIONS:  To proceed with diagnostic coronary angiography.  Risks/benefits of the procedure have been discussed, and patient is willing  to proceed.  We will arrange to have this done later today, given that  patient has not eaten since having breakfast earlier this morning.   In addition to routine blood work, we will cycle cardiac markers, check a  fasting lipid profile, and TSH level.       GS/MEDQ  D:  06/20/2004  T:  06/20/2004  Job:  [id number]   cc:   Vale Haven. Andrey Campanile, [name]  [address]  [place]  [place] [address]  Fax: [phone]

## 2011-02-01 NOTE — H&P (Signed)
[name], [name] NO.:  [id number]   MEDICAL RECORD NO.:  [id number]          PATIENT TYPE:  INP   LOCATION:  1824                         FACILITY:  [facility]   PHYSICIAN:  Troy [name], [name]   DATE OF BIRTH:  05-06-1933   DATE OF ADMISSION:  12/03/2004  DATE OF DISCHARGE:                                HISTORY & PHYSICAL   PHYSICIANS:  1.  Primary cardiologist:  Troy [name], [name]  2.  Primary care physician:  Troy [name], [name]   CHIEF COMPLAINT:  Chest pain.   HISTORY OF PRESENT ILLNESS:  A 77 year old Caucasian gentleman in no acute  distress.  Presented to [facility] via EMS with complaints of  chest pain started around 6 a.m. yesterday morning.  The patient states the  discomfort in his left chest radiating across to his sternum.  He rates it  around a 5 on a scale of 1-10.  He states it is basically constant.  He took  two nitroglycerin yesterday without much relief.  However, after further  examination the bottle of nitroglycerin may have been old.  He states he has  had increased belching.  Denies any nausea or vomiting.  He did become  clammy and diaphoretic with episodes yesterday.  Called 911 this morning  when he continued to have discomfort.  States he had nitroglycerin per EMS  with relief and currently pain free.  He states the discomfort is a  tightness.  Since last catheterization in October 2005, the patient states  he has been doing well.  He has been working part-time and walking.  Negative for shortness of breath.  Negative for generalized weakness.  He  does become dyspneic on exertion and steps.  He thinks the discomfort he is  experiencing over the last few days is similar to past episodes of angina.   ALLERGIES:  CODEINE.   MEDICATIONS:  1.  Toprol-XL 50 mg.  2.  Isosorbide mononitrate AR 30 mg b.i.d.  3.  Zocor 40 mg.  4.  Aspirin 325 mg.  5.  Prilosec 20 mg.   PAST MEDICAL HISTORY:  1.  Positive  for cardiac catheterization.  2.  Positive for bypass.  3.  Last stress test was in September 2004 with an EF of 62% with subtle      basal septal hypoperfusion with no large reversible defects to suggest      ischemia.  4.  Coronary artery disease, status post CABG x3 in 1999 with internal      mammary artery to the LAD, saphenous vein graft to the right coronary      artery, and a saphenous vein graft to the diagonal.  Prior to that he      has stent to the LAD and experienced restenosis.  That was in 1999.  5.  History of small abdominal aneurysm.  6.  Tobacco abuse.  7.  Hypercholesterolemia.   SOCIAL HISTORY:  He lives here in [place] with this wife.  He is a  retired [employer] manager.  He has two adult children alive and  well. Tobacco  abuse.  The patient states he has been smoking around a pack a day since age  79.  He denies any cigarettes over the last week.  However, he is using  pouch tobacco.  Exercise:  Walking.  ETOH:  Occasional beer.  Diet:  No  specific restrictions.  Denies any drug or herbal medication use.   FAMILY HISTORY:  Mother deceased at age 16 from heart disease.  Father  deceased at age 26 from MI, status post CABG.  Siblings:  He has a 73-year-  old brother alive with coronary artery disease, status post interventions.   REVIEW OF SYSTEMS:  CARDIOPULMONARY:  Positive for chest pain, dyspnea on  exertion.  Palpitations with dyspnea on exertion.  GU:  Positive for  straining at times.  GI:  Positive for GERD symptoms at times.  All other  systems negative.   PHYSICAL EXAMINATION:  VITAL SIGNS:  Temperature 98.5, pulse 47 and regular,  respirations 18, blood pressure 124/63.  Saturation 97% on room air.  GENERAL:  He is alert and oriented.  No acute distress.  Denies any chest  pain at the present time.  HEENT:  Pupils are equal, round and reactive to light.  NECK:  Supple without lymphadenopathy.  Negative bruit, negative JVD.  CARDIOVASCULAR:  Normal,  regular S1 and S2.  Sinus bradycardia.  LUNGS:  Clear to auscultation.  However, very diminished throughout.  ABDOMEN:  Soft, nontender.  Positive bowel sounds.  EXTREMITIES:  No clubbing, cyanosis or edema.  NEUROLOGIC:  Alert and oriented x3.  Cranial nerves II-XII grossly intact.   LABORATORY DATA:  Chest x-ray showing no acute findings.  EKG at a rate of  48.  PR 175, QRS 83, QTC 24.  Lab work showing white blood cell count of  6.8, hemoglobin 13.8, hematocrit 40.1, with platelets of 137.  Sodium 138,  potassium 4.4, BUN 14, creatinine 1.2, glucose 131.  Liver enzymes negative.  First point-of-care enzymes:  Troponin less than 0.05.   ASSESSMENT:  Dr. Valera [name] in to examine and assess the patient.  Problem  list as stated.   Problem #1.  Chest pain greater than 24 hours duration, similar to his  angina in the past.  EKG without changes.  Enzymes pending.  Cardiac  catheterization in 2005 showing a circumflex with first marginal branch 75-  80% ostial stenosis.   Problem #2.  Tobacco abuse:  Continues tobacco use since age 27.   Problem #3.  History of coronary artery disease, status post coronary artery  bypass grafting.  The patient instructed to keep fresh nitroglycerin with  him at all times and to call 911 if no relief with nitroglycerin.  He will  need further work-up.  We will admit him to telemetry, cycle his cardiac  enzymes, and schedule the patient for a cardiac catheterization in the  morning to reevaluate coronary vessels.  Dr. Daleen [name] discussed the risks and  benefits with the patient and his son.  The patient agrees to proceed with  catheterization.      MB/MEDQ  D:  12/03/2004  T:  12/03/2004  Job:  [id number]

## 2011-02-01 NOTE — Cardiovascular Report (Signed)
Troy [name] NO.:  [id number]   MEDICAL RECORD NO.:  [id number]          PATIENT TYPE:  INP   LOCATION:  2007                         FACILITY:  [facility]   PHYSICIAN:  Carole Binning, [name] LHCDATE OF BIRTH:  1933-09-05   DATE OF PROCEDURE:  12/04/2004  DATE OF DISCHARGE:                              CARDIAC CATHETERIZATION   PROCEDURE PERFORMED:  Left heart catheterization with coronary angiography,  bypass graft angiography, and left ventriculography.   INDICATIONS:  The patient is a 77 year old male with history of coronary  artery disease, previous coronary bypass surgery.  He was admitted to the  hospital with symptoms of chest pain.  He ruled out for myocardial  infarction and was referred for cardiac catheterization.   PROCEDURE NOTE:  A 6-French sheath was placed in the right femoral artery.  Native coronary angiography was performed with 6-French JL-4 and JR-4  catheters.  The vein grafts were imaged with a JR-4 catheter.  The internal  mammary artery was imaged with an internal mammary catheter.  Left  ventriculography was performed with an angled pigtail catheter.  Contrast  was Omnipaque.  There no complications.   RESULTS:   HEMODYNAMICS:  Left ventricular pressure:  140/14, aortic pressure 140/74.  There is no aortic valve gradient.   LEFT VENTRICULOGRAM:  Wall motion is normal, ejection fraction estimated at  65%.  There is no mitral regurgitation.   CORONARY ARTERIOGRAPHY:  1.  The left main is normal.  2.  Left anterior descending artery is 100% occluded at its origin.  The      distal LAD fills via left internal mammary graft.  3.  Left circumflex has a tubular 30% stenosis in the mid vessel.  The      circumflex gives rise to a very small ramus intermedius, a normal-sized      first obtuse marginal branch, and a large second obtuse marginal branch.      The first obtuse marginal branch has a 60-70% stenosis at its ostium.  The small ramus intermedius has a 70% stenosis in the midbody.  4.  Right coronary artery is a dominant vessel.  It is diffusely diseased      throughout the proximal mid and distal vessel.  In the mid vessel, there      is a discrete 95% stenosis followed by diffuse 80% stenosis.  The distal      right coronary artery fills via saphenous vein graft.  5.  Left internal mammary artery to the distal LAD is patent throughout its      course which fills the mid and distal LAD.  6.  There is a saphenous vein graft to the first diagonal branch.  This      diagonal branch itself is occluded at its origin.  The vein graft is      widely patent with a venous valve in the midportion of the graft but no      stenosis.  This fills a small to normal-sized diagonal branch.  7.  Saphenous vein graft to the distal right coronary artery is patent.  There is moderate to diffuse ectasia in this vein graft but no      significant obstructive disease.  This fills the distal right coronary      artery which gives rise to a normal size posterior descending artery and      normal-sized first posterolateral branch and small second and third      posterolateral branches.   IMPRESSIONS:  1.  Normal left ventricular systolic function.  2.  Native three-vessel coronary artery disease.  3.  Status post coronary bypass surgery.  All grafts were patent.  The left      circumflex is not grafted; however, the disease in the circumflex is      moderate in severity and does not appear to be hemodynamically      significant.   RECOMMENDATIONS:  For continued medical therapy.      MWP/MEDQ  D:  12/04/2004  T:  12/04/2004  Job:  [id number]   cc:   Vale Haven. Andrey Campanile, [name]  [address]  [place]  [place] [address]  Fax: [phone]   E. Graceann Congress, [name]

## 2011-02-01 NOTE — Discharge Summary (Signed)
[place]. [facility]  Patient:    Troy [name], Troy [name]                     MRN: [id number] Adm. Date:  [id number] Disc. Date: [id number] Attending:  Alric Quan                           Discharge Summary  There was no dictation for this report. DD:  10/08/00 TD:  10/09/00 Job: [id number]

## 2011-02-01 NOTE — Discharge Summary (Signed)
[place]. [facility]  Patient:    Troy [name], Troy [name]                     MRN: [id number] Adm. Date:  [id number] Disc. Date: [id number] Attending:  Alric Quan Dictator:   Annett Fabian, P.A.                           Discharge Summary  DISCHARGE DIAGNOSES: 1. Chest pain - negative stress test. 2. Coronary artery disease, status post coronary artery bypass grafting. 3. Hyperlipidemia. 4. Gastroesophageal reflux disease. 5. Hypertension.  ADMISSION HISTORY:  The patient presented to the emergency room on October 07, 2000, complaining of approximately five-day history of episodic chest pain, left chest to the left arm and back.  There was concurrent dizziness.  The patient is not sure about shortness of breath.  No nausea, no diaphoresis.  He described the pain as stabbing, 3/10.  Pain has increased in frequency since the onset on January 17.  He is not sure if there is any improvement with nitroglycerin.  He states that each episode lasts approximately 15 minutes. The patient was seen and admitted by Dr. Willa Rough.  ADMISSION DIAGNOSIS:  Rule out myocardial infarction.  HOSPITAL COURSE:  The patient was placed on IV nitroglycerin at 3 ml/h.  The patient remained stable and pain free overnight.  Cardiac enzymes were serially negative for MI.  That afternoon, the patient underwent adenosine Cardiolite exam.  Nuclear imaging revealed no ischemia, no infarction, and ejection fraction of approximately 58%.  The patient was stable and pain free at time of discharge.  DISCHARGE MEDICATIONS: 1. Prevacid 20 mg q.d. 2. Zocor 20 mg q.d. 3. Enteric-coated aspirin 325 mg q.d. 4. Nitroglycerin 0.4 mg sublingually as needed. 5. Altace 2.5 mg q.d.  DISCHARGE INSTRUCTIONS: 1. The patient was advised to return to his normal level of activity. 2. The patient was advised to eat a low-fat, low-cholesterol diet. 3. The patient was advised to stop  smoking.  DISCHARGE FOLLOWUP:  The patient was advised to follow up with Dr. Andrey Campanile and Dr. Corinda Gubler as needed or scheduled.  LABORATORY AND X-RAY DATA:  White count 6.9, hemoglobin 14.5, hematocrit 42.8, platelets 143.  Sodium 139, potassium 4.0, chloride 109, CO2 25, BUN 13, creatinine 1.0.  Chest x-ray revealed changes of COPD, but no acute disease.  Electrocardiogram revealed sinus bradycardia, rate approximately 57.  Axis 21. P-R interval 0.167, QRS interval 0.083, Q-Tc interval 0.426.  No Q waves were noted, no ischemic changes, no hypertrophy.  There was no obvious change from the previous EKG of July 2000. DD:  10/08/00 TD:  10/09/00 Job: [id number]

## 2011-02-01 NOTE — Discharge Summary (Signed)
Troy [name], Troy [name] NO.:  [id number]   MEDICAL RECORD NO.:  [id number]          PATIENT TYPE:  INP   LOCATION:  2007                         FACILITY:  [facility]   PHYSICIAN:  Jesse Sans. Wall, [name]   DATE OF BIRTH:  May 19, 1933   DATE OF ADMISSION:  12/03/2004  DATE OF DISCHARGE:  12/05/2004                           DISCHARGE SUMMARY - REFERRING   PROCEDURE:  Cardiac catheterization on 12/04/04.   REASON FOR ADMISSION:  Please refer to dictated admission note.   LABORATORY DATA:  Normal serial cardiac markers.  Lipid profile:  Total  cholesterol 159, triglyceride 172, HDL 35, LDL 90, (cholesterol/HDL ratio  4.5).  Potassium 4.2, BUN 15, creatinine 1.1 at discharge.  Hematocrit 40,  platelets 129,000 at discharge, (platelets 137,000 on admission).   Chest x-ray:  No acute disease.   HOSPITAL COURSE:  The patient was admitted for further evaluation and  management of symptoms worrisome for unstable angina pectoris.  Serial  cardiac markers were all within normal limits.   The recommendation was to proceed with cardiac catheterization:  This was  performed the following day, Dr. Dr. Loraine Leriche Pulsipher, (see report for full  details), revealing widely patent grafts and normal left ventricular  function.   Continued medical therapy was recommended; the patient was kept for  overnight observation and cleared for discharge the following morning by Dr.  Daphane Shepherd.   FINAL RECOMMENDATIONS:  1.  Increase Zocor to 80 daily.  2.  Check follow-up fasting lipids/liver profile in three months.   DISCHARGE MEDICATIONS:  1.  Zocor 80 mg q.h.s.  2.  Coated aspirin 325 mg daily.  3.  Imdur 30 mg daily.  4.  Toprol XL 15 mg daily.  5.  Prilosec 20 mg daily.   INSTRUCTIONS:  No heavy lifting/driving x2 days; maintain low  fat/cholesterol diet; call the office if there is any swelling/bleeding in  the groin.   Follow up with Dr. Sarajane Jews on Wednesday, 12/19/04 at 3:15  p.m.   DISCHARGE DIAGNOSES:  1.  Nonischemic chest pain.      1.  Normal serial cardiac markers.      2.  Patent graft/normal left ventricular function - cardiac          catheterization 12/04/04.      3.  Status post coronary artery bypass graft in 1999.  2.  Dyslipidemia.  3.  Tobacco.      GS/MEDQ  D:  12/05/2004  T:  12/05/2004  Job:  [id number]   cc:   Vale Haven. Andrey Campanile, [name]  [address]  [place]  [place] [address]  Fax: [phone]

## 2011-02-06 ENCOUNTER — Encounter: Payer: Self-pay | Admitting: Family Medicine

## 2011-02-06 ENCOUNTER — Ambulatory Visit (INDEPENDENT_AMBULATORY_CARE_PROVIDER_SITE_OTHER): Payer: Medicare Other | Admitting: Family Medicine

## 2011-02-06 VITALS — BP 132/64 | HR 56 | Temp 97.8°F | Wt 185.0 lb

## 2011-02-06 DIAGNOSIS — F17200 Nicotine dependence, unspecified, uncomplicated: Secondary | ICD-10-CM

## 2011-02-06 DIAGNOSIS — J329 Chronic sinusitis, unspecified: Secondary | ICD-10-CM

## 2011-02-06 MED ORDER — AMOXICILLIN-POT CLAVULANATE 875-125 MG PO TABS: 1.0000 | ORAL_TABLET | Freq: Two times a day (BID) | ORAL | Status: AC

## 2011-02-06 NOTE — Assessment & Plan Note (Signed)
With nasal congestion and frontal/ ethmoid sinus tenderness and cough- in a smoker tx with 10 d of augmentin Also disc symptom control - use of nasal saline

## 2011-02-06 NOTE — Progress Notes (Signed)
  Subjective:    Patient ID: Troy [name], male    DOB: Dec 23, 1932, 77 y.o.   MRN: [id number]  HPI Bad headache over both eyes  Coughing up some cloudy mucous  (is a smoker)  Is having congestion in nose and it is runny  Mostly clear d/c  Does not usually have allergies   Going on for about a week  Tried some sudafed for about a week  Has not tried any salt water or nasal spray  No feer  Cough is not too bad  Is wheezing   Is a smoker  Trying to cut back  Unsure about a quit date  Fairly angry about the wait today -- raised his voice at me several times   History   Social History  . Marital Status: Married    Spouse Name: N/A    Number of Children: N/A  . Years of Education: N/A   Occupational History  . Retired from [employer]     Social History Main Topics  . Smoking status: Current Everyday Smoker  . Smokeless tobacco: Not on file   Comment: < 1 ppd; does dip sometimes  . Alcohol Use: Yes     Seldom  . Drug Use: No  . Sexually Active: Not on file   Other Topics Concern  . Not on file   Social History Narrative   Lives with wife; No petsHe is very activeHe was in [employer] for 4 years. He gets most of his blood work at the [employer]         Review of Systems Review of Systems  Constitutional: Negative for fever, appetite change,  and unexpected weight change. pos for fatigue Eyes: Negative for pain and visual disturbance.  Respiratory:pos for cough prod and wheeze (scant) , no sob Cardiovascular: Negative.for cp or edema    Gastrointestinal: Negative for nausea, diarrhea and constipation.  Genitourinary: Negative for urgency and frequency.  Skin: Negative for pallor.or rash   Neurological: Negative for weakness, light-headedness, numbness and pos for headache ENT no st or ear pain .  Hematological: Negative for adenopathy. Does not bruise/bleed easily.  Psychiatric/Behavioral: Negative for dysphoric mood. The patient is not nervous/anxious.          Objective:   Physical Exam  Constitutional: He appears well-developed and well-nourished. No distress.  HENT:  Head: Normocephalic and atraumatic.  Right Ear: External ear normal.  Left Ear: External ear normal.  Mouth/Throat: Oropharynx is clear and moist.       Nares congested and injected Tender frontal and ethmoid sinuses bilat TMs- scant cerumen  Eyes: Conjunctivae and EOM are normal. Pupils are equal, round, and reactive to light. Right eye exhibits no discharge. Left eye exhibits no discharge.  Neck: Normal range of motion. Neck supple.  Cardiovascular: Normal rate, regular rhythm and normal heart sounds.   Pulmonary/Chest: Effort normal and breath sounds normal. No respiratory distress. He has no wheezes. He has no rales. He exhibits no tenderness.       Diffusely distant bs   Lymphadenopathy:    He has no cervical adenopathy.  Neurological: He is alert. No cranial nerve deficit.  Skin: Skin is warm and dry. No rash noted.  Psychiatric:       Angry about the wait today Raised his voice upon entering the room            Assessment & Plan:

## 2011-02-06 NOTE — Patient Instructions (Signed)
Take the augmentin twice daily for 10 days - finish it all  Drink lots of fluids Try the nasal saline spray  Update [place] if you are not feeling better in 10 days or if you are worse at any time Keep working on quitting smoking

## 2011-02-07 NOTE — Assessment & Plan Note (Signed)
Disc in detail risks of smoking and possible outcomes including copd, vascular/ heart disease, cancer , respiratory and sinus infections  Pt voices understanding  

## 2011-04-16 ENCOUNTER — Other Ambulatory Visit: Payer: Self-pay | Admitting: Family Medicine

## 2011-04-16 DIAGNOSIS — E119 Type 2 diabetes mellitus without complications: Secondary | ICD-10-CM

## 2011-04-16 DIAGNOSIS — Z125 Encounter for screening for malignant neoplasm of prostate: Secondary | ICD-10-CM

## 2011-04-16 DIAGNOSIS — E7800 Pure hypercholesterolemia, unspecified: Secondary | ICD-10-CM

## 2011-04-17 DIAGNOSIS — K289 Gastrojejunal ulcer, unspecified as acute or chronic, without hemorrhage or perforation: Secondary | ICD-10-CM

## 2011-04-17 HISTORY — DX: Gastrojejunal ulcer, unspecified as acute or chronic, without hemorrhage or perforation: K28.9

## 2011-04-23 ENCOUNTER — Other Ambulatory Visit (INDEPENDENT_AMBULATORY_CARE_PROVIDER_SITE_OTHER): Payer: Medicare Other | Admitting: Family Medicine

## 2011-04-23 DIAGNOSIS — Z125 Encounter for screening for malignant neoplasm of prostate: Secondary | ICD-10-CM

## 2011-04-23 DIAGNOSIS — E119 Type 2 diabetes mellitus without complications: Secondary | ICD-10-CM

## 2011-04-23 DIAGNOSIS — E7800 Pure hypercholesterolemia, unspecified: Secondary | ICD-10-CM

## 2011-04-23 LAB — PSA: PSA: 0.8 ng/mL (ref 0.10–4.00)

## 2011-04-23 LAB — COMPREHENSIVE METABOLIC PANEL
ALT: 26 U/L (ref 0–53)
AST: 25 U/L (ref 0–37)
Albumin: 4.2 g/dL (ref 3.5–5.2)
Alkaline Phosphatase: 99 U/L (ref 39–117)
BUN: 21 mg/dL (ref 6–23)
CO2: 24 mEq/L (ref 19–32)
Calcium: 9.8 mg/dL (ref 8.4–10.5)
Chloride: 108 mEq/L (ref 96–112)
Creatinine, Ser: 1.2 mg/dL (ref 0.4–1.5)
GFR: 62.6 mL/min (ref >60.00)
Glucose, Bld: 129 mg/dL — ABNORMAL HIGH (ref 70–99)
Potassium: 4.4 mEq/L (ref 3.5–5.1)
Sodium: 142 mEq/L (ref 135–145)
Total Bilirubin: 0.6 mg/dL (ref 0.3–1.2)
Total Protein: 7.8 g/dL (ref 6.0–8.3)

## 2011-04-23 LAB — LIPID PANEL
Cholesterol: 149 mg/dL (ref 0–200)
HDL: 33.6 mg/dL — ABNORMAL LOW (ref >39.00)
Total CHOL/HDL Ratio: 4
Triglycerides: 221 mg/dL — ABNORMAL HIGH (ref 0.0–149.0)
VLDL: 44.2 mg/dL — ABNORMAL HIGH (ref 0.0–40.0)

## 2011-04-23 LAB — HEMOGLOBIN A1C: Hgb A1c MFr Bld: 6.7 % — ABNORMAL HIGH (ref 4.6–6.5)

## 2011-04-23 LAB — LDL CHOLESTEROL, DIRECT: Direct LDL: 83.1 mg/dL

## 2011-04-23 LAB — TSH: TSH: 2.94 u[IU]/mL (ref 0.35–5.50)

## 2011-04-30 ENCOUNTER — Encounter: Payer: Self-pay | Admitting: Family Medicine

## 2011-04-30 ENCOUNTER — Ambulatory Visit (INDEPENDENT_AMBULATORY_CARE_PROVIDER_SITE_OTHER): Payer: Medicare Other | Admitting: Family Medicine

## 2011-04-30 VITALS — BP 144/70 | HR 52 | Temp 98.0°F | Ht 70.75 in | Wt 180.5 lb

## 2011-04-30 DIAGNOSIS — E119 Type 2 diabetes mellitus without complications: Secondary | ICD-10-CM

## 2011-04-30 DIAGNOSIS — K5900 Constipation, unspecified: Secondary | ICD-10-CM

## 2011-04-30 DIAGNOSIS — J4489 Other specified chronic obstructive pulmonary disease: Secondary | ICD-10-CM

## 2011-04-30 DIAGNOSIS — Z8679 Personal history of other diseases of the circulatory system: Secondary | ICD-10-CM

## 2011-04-30 DIAGNOSIS — F17200 Nicotine dependence, unspecified, uncomplicated: Secondary | ICD-10-CM

## 2011-04-30 DIAGNOSIS — J449 Chronic obstructive pulmonary disease, unspecified: Secondary | ICD-10-CM

## 2011-04-30 DIAGNOSIS — E7800 Pure hypercholesterolemia, unspecified: Secondary | ICD-10-CM

## 2011-04-30 DIAGNOSIS — Z0000 Encounter for general adult medical examination without abnormal findings: Secondary | ICD-10-CM

## 2011-04-30 NOTE — Patient Instructions (Addendum)
Make appt at your convenience with eye doctor. Check with insurance about shingles shot and where they prefer administered. Return in 6 months for follow up diabetes, set up spirometry for then.  Return prior fasting for blood work. Keep working on smoking cessation, let me know if I can help. Information on advanced directives/living will provided.  If you make one, send me a copy.

## 2011-04-30 NOTE — Assessment & Plan Note (Addendum)
Good control with metformin. Foot exam today. Advised of need to schedule eye exam. RTC 6 mo for f/u.

## 2011-04-30 NOTE — Assessment & Plan Note (Signed)
Encouraged smoking cessation. precontemplative currently, thinks would quit cold [place].

## 2011-04-30 NOTE — Assessment & Plan Note (Signed)
LDL good.  HDL 33, trig 200s. Discussed blood work results. Consider adding fish oil next visit vs increased fish in diet.

## 2011-04-30 NOTE — Assessment & Plan Note (Signed)
Have asked pt to have spirometry pre/post alb done at next visit.

## 2011-04-30 NOTE — Assessment & Plan Note (Addendum)
I have personally reviewed the Medicare Annual Wellness questionnaire and have noted 1. The patient's medical and social history 2. Their use of alcohol, tobacco or illicit drugs 3. Their current medications and supplements 4. The patient's functional ability including ADL's, fall risks, home safety risks and hearing or visual             impairment. 5. Diet and physical activities 6. Evidence for depression or mood disorders  The patients weight, height, BMI and visual acuity have been recorded in the chart I have made referrals, counseling and provided education to the patient based review of the above and I have provided the pt with a written personalized care plan for preventive services.  Discussed advanced directives - pt will look into this, provided with handout. To look into shingles shot.  Has never had shingles. UTD tetanus, thinks has received PNA shot but no records available. Discussed prostate screening- declines DRE today, desires to stop screening. Discussed colon screening - will request records from Dr. Ewing Schlein for last colonoscopy 2011.  Pt will likely stop screening.

## 2011-04-30 NOTE — Assessment & Plan Note (Signed)
Improved

## 2011-04-30 NOTE — Progress Notes (Signed)
Subjective:    Patient ID: Troy [name], male    DOB: [birth year], 77 y.o.   MRN: [id number]  HPI CC: CPE today  Smoking - almost 1 ppd.  DM - controlled.  No paresthesias.  No lows.  No recent eye exam, last 3 years ago.  HTN - compliant with meds.  No HA, vision changes, CP/tightness, SOB, leg swelling.   HLD - discussed.  Compliant with simvastatin.  ? Of AAA in past - reviewed CT abd/pelvis from 2004, no focal abd aortic enlargement.   preventative: flu shot gets yearly. Tetanus 07/2010 Thinks had PNA shot done. [facility]. last prostate checked at Dr. Phoebe Perch this year. Colonoscopy - had done last year 2011.  Told didn't need rpt for at least 5 years.  Magod.  Medications and allergies reviewed and updated in chart. Patient Active Problem List  Diagnoses  . DIABETES MELLITUS, TYPE II  . HYPERCHOLESTEROLEMIA  . TOBACCO ABUSE  . CONSTIPATION  . ABDOMINAL AORTIC ANEURYSM, HX OF  . OTHER CHEST PAIN  . COPD, mild  . Medicare annual wellness visit, subsequent   Past Medical History  Diagnosis Date  . Coronary atherosclerosis of artery bypass graft   . HLD (hyperlipidemia)   . Tobacco use disorder   . Hypertrophy of prostate without urinary obstruction and other lower urinary tract symptoms (LUTS)   . Diabetes mellitus type II ~2008  . Gastrointestinal ulcer   . Prostate nodule     Left-benign s/p eval Uro WNL (Ottelin)  . Chronic obstructive pulmonary disease (COPD)     on CXR   Past Surgical History  Procedure Date  . Coronary artery bypass graft 1999    Dr. Cornelius Moras  . Hemorrhoid surgery   . Nose surgery   . Rotator cuff repair 04/2010    right (but has bilat) Dr. Yisroel Ramming ([facility])   History  Substance Use Topics  . Smoking status: Current Everyday Smoker -- 1.0 packs/day    Types: Cigarettes  . Smokeless tobacco: Not on file   Comment: < 1 ppd; does dip sometimes  . Alcohol Use: Yes     Seldom   Family History  Problem  Relation Age of Onset  . Cancer Father 57    colon   Allergies  Allergen Reactions  . Codeine     REACTION: vomiting  . Cyclobenzaprine Hcl     REACTION: vomiting  . Hydrocodone Nausea Only and Other (See Comments)    "crazy"   Current Outpatient Prescriptions on File Prior to Visit  Medication Sig Dispense Refill  . acetaminophen (TYLENOL) 500 MG tablet Take 500 mg by mouth as needed.        [name] aspirin 81 MG tablet Take 81 mg by mouth daily.        . isosorbide mononitrate (IMDUR) 30 MG 24 hr tablet Take 15 mg by mouth daily.        . metFORMIN (GLUCOPHAGE) 500 MG tablet Take 500 mg by mouth 2 (two) times daily with a meal.        . metoprolol (LOPRESSOR) 50 MG tablet Take 25 mg by mouth 2 (two) times daily.        . nitroGLYCERIN (NITROSTAT) 0.4 MG SL tablet Place 0.4 mg under the tongue every 5 (five) minutes as needed. For chest pain-may repeat x3       . omeprazole (PRILOSEC) 20 MG capsule Take 20 mg by mouth daily.        [name]  simvastatin (ZOCOR) 80 MG tablet Take 40 mg by mouth at bedtime.        [name] albuterol (VENTOLIN HFA) 108 (90 BASE) MCG/ACT inhaler Inhale 2 puffs into the lungs every 6 (six) hours as needed. For shortness or breath, wheezing       . polyethylene glycol (MIRALAX / GLYCOLAX) packet Take 17 g by mouth daily as needed. For constipation         Review of Systems  Constitutional: Negative for fever, chills, activity change, appetite change, fatigue and unexpected weight change.  HENT: Negative for hearing loss and neck pain.   Eyes: Negative for visual disturbance.  Respiratory: Negative for cough, chest tightness, shortness of breath and wheezing.   Cardiovascular: Negative for chest pain, palpitations and leg swelling.  Gastrointestinal: Negative for nausea, vomiting, abdominal pain, diarrhea, constipation, blood in stool and abdominal distention.  Genitourinary: Negative for hematuria and difficulty urinating.  Musculoskeletal: Negative for myalgias and  arthralgias.  Skin: Negative for rash.  Neurological: Negative for dizziness, seizures, syncope and headaches.  Hematological: Does not bruise/bleed easily.  Psychiatric/Behavioral: Negative for dysphoric mood. The patient is not nervous/anxious.        Objective:   Physical Exam  Nursing note and vitals reviewed. Constitutional: He is oriented to person, place, and time. He appears well-developed and well-nourished. No distress.  HENT:  Head: Normocephalic and atraumatic.  Right Ear: External ear normal.  Left Ear: External ear normal.  Nose: Nose normal. No mucosal edema or rhinorrhea.  Mouth/Throat: Uvula is midline, oropharynx is clear and moist and mucous membranes are normal. No oropharyngeal exudate, posterior oropharyngeal edema, posterior oropharyngeal erythema or tonsillar abscesses.       Bilateral cerumen impactions Small, convoluted canals Irrigation performed successfully on right, unsuccessfully on left  Eyes: Conjunctivae and EOM are normal. Pupils are equal, round, and reactive to light.  Neck: Normal range of motion. Neck supple. No thyromegaly present.  Cardiovascular: Normal rate, regular rhythm, normal heart sounds and intact distal pulses.   No murmur heard. Pulses:      Radial pulses are 2+ on the right side, and 2+ on the left side.  Pulmonary/Chest: Effort normal and breath sounds normal. No respiratory distress. He has no wheezes. He has no rales.       Somewhat coarse  Abdominal: Soft. Bowel sounds are normal. He exhibits no distension and no mass. There is no tenderness. There is no rebound and no guarding.  Musculoskeletal: Normal range of motion. He exhibits no edema and no tenderness.       Diabetic foot exam: Normal inspection No skin breakdown No calluses  Normal DP/PT pulses Normal sensation to light tough and monofilament Nails normal  Lymphadenopathy:    He has no cervical adenopathy.  Neurological: He is alert and oriented to person, place,  and time.       CN grossly intact, station and gait intact  Skin: Skin is warm and dry. No rash noted.  Psychiatric: He has a normal mood and affect. His behavior is normal. Judgment and thought content normal.          Assessment & Plan:

## 2011-05-02 ENCOUNTER — Encounter: Payer: Self-pay | Admitting: Family Medicine

## 2011-05-16 ENCOUNTER — Encounter: Payer: Self-pay | Admitting: Family Medicine

## 2011-05-16 ENCOUNTER — Ambulatory Visit (INDEPENDENT_AMBULATORY_CARE_PROVIDER_SITE_OTHER): Payer: Medicare Other | Admitting: Family Medicine

## 2011-05-16 VITALS — BP 124/82 | HR 84 | Temp 98.5°F | Wt 177.5 lb

## 2011-05-16 DIAGNOSIS — K279 Peptic ulcer, site unspecified, unspecified as acute or chronic, without hemorrhage or perforation: Secondary | ICD-10-CM | POA: Insufficient documentation

## 2011-05-16 DIAGNOSIS — K920 Hematemesis: Secondary | ICD-10-CM

## 2011-05-16 HISTORY — PX: ESOPHAGOGASTRODUODENOSCOPY: SHX1529

## 2011-05-16 LAB — COMPREHENSIVE METABOLIC PANEL
ALT: 23 U/L (ref 0–53)
AST: 27 U/L (ref 0–37)
Albumin: 4.7 g/dL (ref 3.5–5.2)
Alkaline Phosphatase: 112 U/L (ref 39–117)
BUN: 22 mg/dL (ref 6–23)
CO2: 24 mEq/L (ref 19–32)
Calcium: 10 mg/dL (ref 8.4–10.5)
Chloride: 103 mEq/L (ref 96–112)
Creatinine, Ser: 1.2 mg/dL (ref 0.4–1.5)
GFR: 61.41 mL/min (ref >60.00)
Glucose, Bld: 142 mg/dL — ABNORMAL HIGH (ref 70–99)
Potassium: 4.1 mEq/L (ref 3.5–5.1)
Sodium: 138 mEq/L (ref 135–145)
Total Bilirubin: 0.9 mg/dL (ref 0.3–1.2)
Total Protein: 8.5 g/dL — ABNORMAL HIGH (ref 6.0–8.3)

## 2011-05-16 LAB — CBC WITH DIFFERENTIAL/PLATELET
Basophils Absolute: 0 10*3/uL (ref 0.0–0.1)
Basophils Relative: 0.4 % (ref 0.0–3.0)
Eosinophils Absolute: 0 10*3/uL (ref 0.0–0.7)
Eosinophils Relative: 0.1 % (ref 0.0–5.0)
HCT: 43.6 % (ref 39.0–52.0)
Hemoglobin: 14.4 g/dL (ref 13.0–17.0)
Lymphocytes Relative: 18.3 % (ref 12.0–46.0)
Lymphs Abs: 2 10*3/uL (ref 0.7–4.0)
MCHC: 33 g/dL (ref 30.0–36.0)
MCV: 91.1 fl (ref 78.0–100.0)
Monocytes Absolute: 1 10*3/uL (ref 0.1–1.0)
Monocytes Relative: 8.9 % (ref 3.0–12.0)
Neutro Abs: 7.7 10*3/uL (ref 1.4–7.7)
Neutrophils Relative %: 72.3 % (ref 43.0–77.0)
Platelets: 141 10*3/uL — ABNORMAL LOW (ref 150.0–400.0)
RBC: 4.79 Mil/uL (ref 4.22–5.81)
RDW: 14.4 % (ref 11.5–14.6)
WBC: 10.7 10*3/uL — ABNORMAL HIGH (ref 4.5–10.5)

## 2011-05-16 LAB — LIPASE: Lipase: 24 U/L (ref 11.0–59.0)

## 2011-05-16 MED ORDER — ESOMEPRAZOLE MAGNESIUM 40 MG PO CPDR: 40.0000 mg | DELAYED_RELEASE_CAPSULE | Freq: Every day | ORAL | Status: DC

## 2011-05-16 NOTE — Progress Notes (Addendum)
Subjective:    Patient ID: Troy [name], male    DOB: 1933-08-23, 77 y.o.   MRN: [id number]  HPI CC: abd pain, vomiting black liquid/water  Last night had significant abd pain lower abdomen and LLQ, no radiation.  Then this am awoke with abd pain, led to emesis of black liquid, no green or red.  No coffee ground emesis.  After emesis abd pain improved.  Slight constipation currently.  Is feeling some more bloated.  H/o PUD, no problems recently.  Takes omeprazole 20mg  daily.  Seldom reflux sxs.  Tuesday night ate fish, last night ate peanuts and tomato sandwich, no coffee.  No increase in alcohol, no NSAIDs recently.  Colonocsopy 2010 - adenomatous and hyperplastic polyp.  No mention of diverticulosis.  No fevers/chills, diarrhea, BM changes, blood in stool, black tarry stools.  Appetite ok now, abd pain not present currently.  No dizziness, SOB, HA. Wt Readings from Last 3 Encounters:  05/16/11 177 lb 8 oz (80.513 kg)  04/30/11 180 lb 8 oz (81.874 kg)  02/06/11 185 lb (83.915 kg)   Medications and allergies reviewed and updated in chart.  Past histories reviewed and updated if relevant as below. Patient Active Problem List  Diagnoses  . DIABETES MELLITUS, TYPE II  . HYPERCHOLESTEROLEMIA  . TOBACCO ABUSE  . CONSTIPATION  . OTHER CHEST PAIN  . COPD, mild  . Medicare annual wellness visit, subsequent  . PUD (peptic ulcer disease)   Past Medical History  Diagnosis Date  . Coronary atherosclerosis of artery bypass graft   . HLD (hyperlipidemia)   . Tobacco use disorder   . Hypertrophy of prostate without urinary obstruction and other lower urinary tract symptoms (LUTS)   . Diabetes mellitus type II ~2008  . Gastrointestinal ulcer   . Prostate nodule     Left-benign s/p eval Uro WNL (Ottelin)  . Chronic obstructive pulmonary disease (COPD)     on CXR   Past Surgical History  Procedure Date  . Coronary artery bypass graft 1999    Dr. Cornelius Moras  . Hemorrhoid surgery   .  Nose surgery   . Rotator cuff repair 04/2010    right (but has bilat) Dr. Yisroel Ramming ([facility])  . Colonoscopy 03/2009    small hemorrhoids, 2 polyps (hyperplastic and adenomatous), rpt 5 yrs (Dr. Maryjane Hurter)   History  Substance Use Topics  . Smoking status: Current Everyday Smoker -- 1.0 packs/day    Types: Cigarettes  . Smokeless tobacco: Not on file   Comment: < 1 ppd; does dip sometimes  . Alcohol Use: Yes     Seldom   Family History  Problem Relation Age of Onset  . Cancer Father 84    colon   Allergies  Allergen Reactions  . Codeine     REACTION: vomiting  . Cyclobenzaprine Hcl     REACTION: vomiting  . Hydrocodone Nausea Only and Other (See Comments)    "crazy"   Current Outpatient Prescriptions on File Prior to Visit  Medication Sig Dispense Refill  . acetaminophen (TYLENOL) 500 MG tablet Take 500 mg by mouth as needed.        [name] albuterol (VENTOLIN HFA) 108 (90 BASE) MCG/ACT inhaler Inhale 2 puffs into the lungs every 6 (six) hours as needed. For shortness or breath, wheezing       . aspirin 81 MG tablet Take 81 mg by mouth daily.        . isosorbide mononitrate (IMDUR) 30 MG 24 hr tablet  Take 15 mg by mouth daily.        . metFORMIN (GLUCOPHAGE) 500 MG tablet Take 500 mg by mouth 2 (two) times daily with a meal.        . metoprolol (LOPRESSOR) 50 MG tablet Take 25 mg by mouth 2 (two) times daily.        . nitroGLYCERIN (NITROSTAT) 0.4 MG SL tablet Place 0.4 mg under the tongue every 5 (five) minutes as needed. For chest pain-may repeat x3       . omeprazole (PRILOSEC) 20 MG capsule Take 20 mg by mouth daily.        . polyethylene glycol (MIRALAX / GLYCOLAX) packet Take 17 g by mouth daily as needed. For constipation        . simvastatin (ZOCOR) 80 MG tablet Take 40 mg by mouth at bedtime.         Review of Systems Per HPI    Objective:   Physical Exam  Nursing note and vitals reviewed. Constitutional: He appears well-developed and well-nourished. No  distress.  HENT:  Head: Normocephalic and atraumatic.  Mouth/Throat: Oropharynx is clear and moist. No oropharyngeal exudate.  Eyes: Conjunctivae and EOM are normal. Pupils are equal, round, and reactive to light. No scleral icterus.  Cardiovascular: Normal rate, regular rhythm, normal heart sounds and intact distal pulses.   No murmur heard. Pulmonary/Chest: Effort normal and breath sounds normal. No respiratory distress. He has no wheezes. He has no rales.  Abdominal: Soft. He exhibits no shifting dullness, no distension, no ascites and no mass. Bowel sounds are increased. There is no hepatosplenomegaly. There is tenderness (mild to palpation) in the right lower quadrant, epigastric area and left lower quadrant. There is no rigidity, no rebound, no guarding, no CVA tenderness and negative Murphy's sign.  Musculoskeletal: He exhibits no edema.  Skin: Skin is warm and dry. No rash noted. No pallor.       Good cap refill  Psychiatric: He has a normal mood and affect.          Assessment & Plan:

## 2011-05-16 NOTE — Patient Instructions (Addendum)
Sounds like peptic ulcer again. Start stronger PPI - nexium 40mg  once daily for a month.  Stop omeprazole I will have you make appointment with Dr. Ewing Schlein, pass by [facility]'s office to make that appointment. Blood work today to check on blood count. Push clear fluids, bland diet until you see Dr. Ewing Schlein (chicken noodle soup, gatorade, water).  Nothing spicy or acidic. Hold aspirin until you see stomach doctor. If start having fevers >101, if pain worsening, or vomiting returns, please go to ER.

## 2011-05-16 NOTE — Progress Notes (Signed)
Addended by: Eustaquio Boyden on: 05/16/2011 02:38 PM   Modules accepted: Level of Service

## 2011-05-16 NOTE — Assessment & Plan Note (Addendum)
abd pain last night relieved by hematemesis x 2 this am. Concern for ulcer, started after tomatoes. Increase ppi to nexium 40mg  daily for next month. Some weight loss in last few months. Red flags to seek ER care discussed. Check STAT blood work today for Hgb.  Vitals stable today, nontoxic today, ok for outpt management. Spoke with Dr. Ewing Schlein (beeper [phone]) - I will receive STAT blood work and call pt and Dr. Ewing Schlein with results. rec stop ASA until seen by GI.  Advised cut back on smoking acutely. Specifically discussed reasons to go to ER including dizziness, SOB, return of bleeding or abd pain.

## 2011-05-17 ENCOUNTER — Other Ambulatory Visit: Payer: Self-pay | Admitting: Gastroenterology

## 2011-05-17 ENCOUNTER — Ambulatory Visit (HOSPITAL_COMMUNITY)
Admission: RE | Admit: 2011-05-17 | Discharge: 2011-05-17 | Disposition: A | Discharge status: Home / Self Care | Payer: Medicare Other | Source: Ambulatory Visit | Attending: Gastroenterology | Admitting: Gastroenterology

## 2011-05-17 DIAGNOSIS — I10 Essential (primary) hypertension: Secondary | ICD-10-CM | POA: Insufficient documentation

## 2011-05-17 DIAGNOSIS — E7800 Pure hypercholesterolemia, unspecified: Secondary | ICD-10-CM | POA: Insufficient documentation

## 2011-05-17 DIAGNOSIS — R1110 Vomiting, unspecified: Secondary | ICD-10-CM | POA: Insufficient documentation

## 2011-05-17 DIAGNOSIS — K449 Diaphragmatic hernia without obstruction or gangrene: Secondary | ICD-10-CM | POA: Insufficient documentation

## 2011-05-17 DIAGNOSIS — K2270 Barrett's esophagus without dysplasia: Secondary | ICD-10-CM | POA: Insufficient documentation

## 2011-05-17 DIAGNOSIS — E119 Type 2 diabetes mellitus without complications: Secondary | ICD-10-CM | POA: Insufficient documentation

## 2011-05-17 DIAGNOSIS — K219 Gastro-esophageal reflux disease without esophagitis: Secondary | ICD-10-CM | POA: Insufficient documentation

## 2011-05-27 ENCOUNTER — Telehealth: Payer: Self-pay | Admitting: *Deleted

## 2011-05-27 NOTE — Telephone Encounter (Signed)
Pt has been taking nexium for about a week and has had diarrhea times 2 days.  He thinks that is coming from the nexium.  No other symptoms- no nausea or fever or pain. Please advise on what he should do.

## 2011-05-28 ENCOUNTER — Encounter: Payer: Self-pay | Admitting: *Deleted

## 2011-05-28 MED ORDER — OMEPRAZOLE 40 MG PO CPDR: 40.0000 mg | DELAYED_RELEASE_CAPSULE | Freq: Every day | ORAL | Status: DC

## 2011-05-28 NOTE — Telephone Encounter (Signed)
Spoke with patient. He said GI didn't tell him anything other than he didn't have an ulcer. No plan was given to him according to him. He will try the 40 mg of omeprazole and stop the nexium. Records requested.

## 2011-05-28 NOTE — Telephone Encounter (Signed)
Can we try and get records from GI regarding EGD or f/u plan or ask patient? Noted barrett's esophagus on surgical path. Could do trial of omeprazole 40mg  daily to see if improvement in diarrhea.  Sent in.

## 2011-05-29 ENCOUNTER — Encounter: Payer: Self-pay | Admitting: Family Medicine

## 2011-06-05 NOTE — Op Note (Signed)
  Troy [name], MITTMAN NO.:  [id number]  MEDICAL RECORD NO.:  [id number]  LOCATION:  [facility]                         FACILITY:  [facility]  PHYSICIAN:  Petra Kuba, [name]    DATE OF BIRTH:  06/02/1933  DATE OF PROCEDURE:  05/17/2011 DATE OF DISCHARGE:                              OPERATIVE REPORT   __________  PROCEDURE:  Esophagogastroduodenoscopy with biopsy.  INDICATIONS:  Questionable upper GI bleeding in a patient with history of reflux and black vomitus, but normal hemoglobin, asymptomatic today. Consent was signed after risks, benefits, methods, options thoroughly discussed prior to sedation.  MEDICINES USED:  Fentanyl 75 mcg, Versed 5 mg.  DESCRIPTION OF PROCEDURE:  Videoendoscope was inserted by direct vision. A quick look at the vocal cords were normal.  The proximal and mid esophagus was normal.  In the distal esophagus, there was a small hiatal hernia with obvious short-segment Barrett's, but no signs of bleeding. This area was biopsied at the end of the procedure.  Scope passed into the stomach and advanced through a normal antrum, normal pylorus into a normal duodenal bulb and around the C-loop to a normal second and probably third part of the duodenum.  No blood was seen distally.  Scope was slowly withdrawn back to the bulb, and again, no abnormalities were seen.  Scope was withdrawn back to the stomach and retroflexed.  He had some difficulty holding air on retroflexed visualization __________ the hiatal hernia was confirmed.  The fundus, angularis, lesser and greater curve were evaluated on retroflexed and then straight visualization.  No signs of bleeding or abnormalities were seen.  Air was suctioned.  Scope was slowly withdrawn.  Again, a quick look at the esophagus confirmed above findings.  We went ahead and repeated the endoscopy one more time to rule out any missed lesions, but none were seen.  We then took our biopsies of the distal  esophagus.  Air was suctioned.  Scope removed. The patient tolerated the procedure well.  There was no obvious immediate complication.  ENDOSCOPIC DIAGNOSES: 1. Small hiatal hernia with obvious short-segment Barrett's, status     post biopsy at the end of the procedure. 2. Otherwise, normal esophagogastroduodenoscopy to the second or third     part of the duodenum without any blood being seen or any at-risk     lesions.  PLAN:  We will await pathology.  Call if signs of bleeding or other GI problems.  If not, follow up in 6 weeks.  We will hold aspirin for 1 week and continue Nexium.          ______________________________ Petra Kuba, [name]     MEM/MEDQ  D:  05/17/2011  T:  05/17/2011  Job:  [id number]  Electronically Signed by Vida Rigger [name] on 06/05/2011 02:56:36 PM

## 2011-06-26 ENCOUNTER — Encounter: Payer: Self-pay | Admitting: Family Medicine

## 2011-07-24 ENCOUNTER — Encounter: Payer: Self-pay | Admitting: Family Medicine

## 2011-07-24 ENCOUNTER — Ambulatory Visit (INDEPENDENT_AMBULATORY_CARE_PROVIDER_SITE_OTHER): Payer: Medicare Other | Admitting: Family Medicine

## 2011-07-24 DIAGNOSIS — K5900 Constipation, unspecified: Secondary | ICD-10-CM

## 2011-07-24 NOTE — Progress Notes (Signed)
  Subjective:    Patient ID: Troy [name], male    DOB: Jan 28, 1933, 77 y.o.   MRN: [id number]  HPI CC: no BM 1 wk  H/o chronic constipation, going on 2-3 weeks.  Feels like needs to go but unable to.  Passing gas fine.  When does have BM, hard initially.  Using magnesium citrate once a week.  Does have large bowel movements when uses this.  Not otherwise.  Hasn't used miralax in a while.  Normally goes every 2-3 days.  Eating normally.  No abd pain, fevers/chills, nausea/vomiting.  No blood in stool.  Not drinking plenty of water, not eating much fiber.  Trying to eat grapes and apples.  Colonocsopy 2010 - adenomatous and hyperplastic polyp. No mention of diverticulosis.  Review of Systems Per HPI    Objective:   Physical Exam  Nursing note and vitals reviewed. Constitutional: He appears well-developed and well-nourished. No distress.  Abdominal: Soft. Bowel sounds are normal. He exhibits no distension and no mass. There is no tenderness. There is no rebound and no guarding.  Genitourinary: Rectum normal and prostate normal. Rectal exam shows no external hemorrhoid, no internal hemorrhoid, no fissure, no mass, no tenderness and anal tone normal.       No stool in vault  Psychiatric: He has a normal mood and affect.      Assessment & Plan:

## 2011-07-24 NOTE — Patient Instructions (Addendum)
Make sure you're only taking one of either nexium (purple pill) or omeprazole for stomach.  We have you here as only taking omeprazole 40mg  daily. For constipation - increase water  --> key Increase fiber in diet - handout provided. start soluble fiber supplement such as metamucil, citrucel or benefiber daily. Take miralax one capful daily. If not stool in 1-2 days, call me.  Will do trial of suppository. Let me know how you are doing.  Constipation in Adults Constipation is having fewer than 2 bowel movements per week. Usually, the stools are hard. As we grow older, constipation is more common. If you try to fix constipation with laxatives, the problem may get worse. This is because laxatives taken over a long period of time make the colon muscles weaker. A low-fiber diet, not taking in enough fluids, and taking some medicines may make these problems worse. MEDICATIONS THAT MAY CAUSE CONSTIPATION  Water pills (diuretics).   Calcium channel blockers (used to control blood pressure and for the heart).   Certain pain medicines (narcotics).   Anticholinergics.   Anti-inflammatory agents.   Antacids that contain aluminum.  DISEASES THAT CONTRIBUTE TO CONSTIPATION  Diabetes.   Parkinson's disease.   Dementia.   Stroke.   Depression.   Illnesses that cause problems with salt and water metabolism.  HOME CARE INSTRUCTIONS   Constipation is usually best cared for without medicines. Increasing dietary fiber and eating more fruits and vegetables is the best way to manage constipation.   Slowly increase fiber intake to 25 to 38 grams per day. Whole grains, fruits, vegetables, and legumes are good sources of fiber. A dietitian can further help you incorporate high-fiber foods into your diet.   Drink enough water and fluids to keep your urine clear or pale yellow.   A fiber supplement may be added to your diet if you cannot get enough fiber from foods.   Increasing your activities  also helps improve regularity.   Suppositories, as suggested by your caregiver, will also help. If you are using antacids, such as aluminum or calcium containing products, it will be helpful to switch to products containing magnesium if your caregiver says it is okay.   If you have been given a liquid injection (enema) today, this is only a temporary measure. It should not be relied on for treatment of longstanding (chronic) constipation.   Stronger measures, such as magnesium sulfate, should be avoided if possible. This may cause uncontrollable diarrhea. Using magnesium sulfate may not allow you time to make it to the bathroom.  SEEK IMMEDIATE MEDICAL CARE IF:   There is bright red blood in the stool.   The constipation stays for more than 4 days.   There is belly (abdominal) or rectal pain.   You do not seem to be getting better.   You have any questions or concerns.  MAKE SURE YOU:   Understand these instructions.   Will watch your condition.   Will get help right away if you are not doing well or get worse.  Document Released: 05/31/2004 Document Revised: 05/15/2011 Document Reviewed: 04/20/2008 [facility] Patient Information 2012 [place], [place].

## 2011-07-24 NOTE — Assessment & Plan Note (Signed)
Recently worsened. Discussed importance of water in diet. Discussed more fiber in diet, start soluble fiber supplement. Daily miralax. If no stool in 1-2 days, call me for next step - suppository. Consider amitiza. If abd pain, no flatus, changing or worsening, seek urgent care .

## 2011-09-16 ENCOUNTER — Ambulatory Visit (INDEPENDENT_AMBULATORY_CARE_PROVIDER_SITE_OTHER): Payer: Medicare Other | Admitting: Family Medicine

## 2011-09-16 ENCOUNTER — Ambulatory Visit: Payer: Medicare Other | Admitting: Family Medicine

## 2011-09-16 ENCOUNTER — Encounter: Payer: Self-pay | Admitting: Family Medicine

## 2011-09-16 VITALS — BP 112/70 | HR 56 | Temp 97.9°F | Wt 176.8 lb

## 2011-09-16 DIAGNOSIS — R21 Rash and other nonspecific skin eruption: Secondary | ICD-10-CM

## 2011-09-16 DIAGNOSIS — L989 Disorder of the skin and subcutaneous tissue, unspecified: Secondary | ICD-10-CM | POA: Insufficient documentation

## 2011-09-16 LAB — POCT SKIN KOH: Skin KOH, POC: NEGATIVE

## 2011-09-16 MED ORDER — TRIAMCINOLONE ACETONIDE 0.1 % EX CREA: TOPICAL_CREAM | Freq: Two times a day (BID) | CUTANEOUS | Status: DC

## 2011-09-16 NOTE — Assessment & Plan Note (Signed)
KOH today - negative. Anticipate xerosis from dry environment.  Will treat with steroid cream for 2 wks, moisturizing throughout the day, update me if not improving. Alternatively could be presentation of lichen planus. Doubt due to dove, although given temporal association asked them to switch back to ivory.

## 2011-09-16 NOTE — Patient Instructions (Signed)
This looks like xerosis - dry skin dermatitis. Treat with triamcinolone cream twice daily for 2 weeks. Also use moisturizer throughout the day (aveeno or eucerin are good brands or any over the counter fragrance free emollient (moisturizer)) Update me if no improvement with this. Checking today to ensure not fungal infection. No hot showers, warm only.

## 2011-09-16 NOTE — Progress Notes (Signed)
  Subjective:    Patient ID: Troy [name], male    DOB: [birth year], 77 y.o.   MRN: [id number]  HPI CC: skin rash  3 wk h/o rash on legs and some on back.  Itchy and burning rash on legs.  Waking him up at night, scratches because pruritic.  No bleeding.  Seems to be spreading.  Using gold bond for itching which helps some.    No new lotions, detergents, soaps, shampoos, meds.  Uses dove soap (prior used ivory) and tide shampoo.  No new meds, foods.  No fevers/chills, nausea/vomiting.  No oral lesions.  Review of Systems Per HPI    Objective:   Physical Exam  Skin: Skin is warm and dry. Rash noted.          Dry skin throughout Hyperkeratotic erythematous rash with some lichenification, bilateral posterior medial calves, +excoriation.  Pruritic. Smaller spots on back. No oral lesions       Assessment & Plan:

## 2011-09-17 DIAGNOSIS — M5126 Other intervertebral disc displacement, lumbar region: Secondary | ICD-10-CM

## 2011-09-17 HISTORY — DX: Other intervertebral disc displacement, lumbar region: M51.26

## 2011-10-23 ENCOUNTER — Other Ambulatory Visit: Payer: Self-pay | Admitting: Family Medicine

## 2011-10-23 DIAGNOSIS — E119 Type 2 diabetes mellitus without complications: Secondary | ICD-10-CM

## 2011-10-23 DIAGNOSIS — E7800 Pure hypercholesterolemia, unspecified: Secondary | ICD-10-CM

## 2011-10-23 DIAGNOSIS — K279 Peptic ulcer, site unspecified, unspecified as acute or chronic, without hemorrhage or perforation: Secondary | ICD-10-CM

## 2011-10-25 ENCOUNTER — Telehealth: Payer: Self-pay | Admitting: *Deleted

## 2011-10-25 NOTE — Telephone Encounter (Signed)
Patient notified and appt scheduled for Saturday clinic. 

## 2011-10-25 NOTE — Telephone Encounter (Signed)
Patient walked in and said he was having the same symptoms that his wife has. He has a cough, congestion, head pressure; ST, drainage and aches. He has no fever. Symptoms began yesterday. He requested that we call him in the same abx that his wife is taking so that he can get better. I advised I would ask but that you may recommend that he go to the Saturday clinic for eval. He said to call him at [phone]. He uses [facility].

## 2011-10-25 NOTE — Telephone Encounter (Signed)
Can we set him up at Saturday clinic?  Thanks.

## 2011-10-26 ENCOUNTER — Ambulatory Visit (INDEPENDENT_AMBULATORY_CARE_PROVIDER_SITE_OTHER): Payer: Medicare Other | Admitting: Family Medicine

## 2011-10-26 ENCOUNTER — Encounter: Payer: Self-pay | Admitting: Family Medicine

## 2011-10-26 VITALS — BP 120/60 | HR 54 | Temp 98.5°F | Wt 180.0 lb

## 2011-10-26 DIAGNOSIS — J209 Acute bronchitis, unspecified: Secondary | ICD-10-CM | POA: Insufficient documentation

## 2011-10-26 MED ORDER — ALBUTEROL SULFATE HFA 108 (90 BASE) MCG/ACT IN AERS: 2.0000 | INHALATION_SPRAY | RESPIRATORY_TRACT | Status: DC | PRN

## 2011-10-26 MED ORDER — AZITHROMYCIN 250 MG PO TABS: ORAL_TABLET | ORAL | Status: DC

## 2011-10-26 NOTE — Assessment & Plan Note (Addendum)
In pt with copd -- the reactive airways are quite mild  Will cover with zithromax in light of smoking status and copd  Refilled his albuterol MDI - to use prn  inst to call if worse wheezing - to consider prednisone Disc symptomatic care - see instructions on AVS  Update if not starting to improve in a week or if worsening   Pt counseled on smoking cessation but states he is not interested in quitting

## 2011-10-26 NOTE — Progress Notes (Signed)
Subjective:    Patient ID: Troy [name], male    DOB: October 26, 1932, 78 y.o.   MRN: [id number]  HPI Started getting sick 3 days ago -- body aches  Congested in head and chest and has a sore throat  No wheeze or sob   Is coughing up phlegm - yellow and clear   Ears are ok   No n/v/d   No fever right now   Took some tylenol  Is a smoker  Did have a flu shot this season   Patient Active Problem List  Diagnoses  . DIABETES MELLITUS, TYPE II  . HYPERCHOLESTEROLEMIA  . TOBACCO ABUSE  . CONSTIPATION  . OTHER CHEST PAIN  . COPD, mild  . Medicare annual wellness visit, subsequent  . PUD (peptic ulcer disease)  . Hematemesis  . Skin rash  . Bronchitis, acute, with bronchospasm   Past Medical History  Diagnosis Date  . Coronary atherosclerosis of artery bypass graft   . HLD (hyperlipidemia)   . Tobacco use disorder   . Hypertrophy of prostate without urinary obstruction and other lower urinary tract symptoms (LUTS)   . Diabetes mellitus type II ~2008  . Gastrointestinal ulcer   . Prostate nodule     Left-benign s/p eval Uro WNL (Ottelin)  . Chronic obstructive pulmonary disease (COPD)     on CXR  . Barrett's esophagus     EGD 2012, rpt 2014, longterm PPI   Past Surgical History  Procedure Date  . Coronary artery bypass graft 1999    Dr. Cornelius Moras  . Hemorrhoid surgery   . Nose surgery   . Rotator cuff repair 04/2010    right (but has bilat) Dr. Yisroel Ramming ([facility])  . Colonoscopy 03/2009    small hemorrhoids, 2 polyps (hyperplastic and adenomatous), rpt 5 yrs (Dr. Maryjane Hurter)  . Esophagogastroduodenoscopy 05/16/2011    after hematemesis, no lesions found, consistent with barrett's esophagus rpt 2 yrs   History  Substance Use Topics  . Smoking status: Current Everyday Smoker -- 1.0 packs/day    Types: Cigarettes  . Smokeless tobacco: Not on file   Comment: < 1 ppd; does dip sometimes  . Alcohol Use: Yes     Seldom   Family History  Problem Relation Age of  Onset  . Cancer Father 25    colon   Allergies  Allergen Reactions  . Codeine     REACTION: vomiting  . Cyclobenzaprine Hcl     REACTION: vomiting  . Hydrocodone Nausea Only and Other (See Comments)    "crazy"   Current Outpatient Prescriptions on File Prior to Visit  Medication Sig Dispense Refill  . acetaminophen (TYLENOL) 500 MG tablet Take 500 mg by mouth as needed.        [name] aspirin 81 MG tablet Take 81 mg by mouth daily.        . isosorbide mononitrate (IMDUR) 30 MG 24 hr tablet Take 15 mg by mouth daily.        . metFORMIN (GLUCOPHAGE) 500 MG tablet Take 500 mg by mouth 2 (two) times daily with a meal.        . metoprolol (LOPRESSOR) 50 MG tablet Take 25 mg by mouth 2 (two) times daily.        . nitroGLYCERIN (NITROSTAT) 0.4 MG SL tablet Place 0.4 mg under the tongue every 5 (five) minutes as needed. For chest pain-may repeat x3       . omeprazole (PRILOSEC) 40 MG capsule Take 1 capsule (  40 mg total) by mouth daily.  30 capsule  6  . simvastatin (ZOCOR) 80 MG tablet Take 40 mg by mouth at bedtime.        . polyethylene glycol (MIRALAX / GLYCOLAX) packet Take 17 g by mouth daily as needed. For constipation        . triamcinolone cream (KENALOG) 0.1 % Apply topically 2 (two) times daily. Apply to AA.  30 g  0        Review of Systems Review of Systems  Constitutional: Negative for fever, appetite change,  and unexpected weight change. pos for fatigue and body aches  Eyes: Negative for pain and visual disturbance.  ENT pos for cong and st and neg for ear pain or sinus pain  Respiratory: Negative for sob or wheeze/ pos for prod cough  Cardiovascular: Negative for cp or palpitations    Gastrointestinal: Negative for nausea, diarrhea and constipation.  Genitourinary: Negative for urgency and frequency.  Skin: Negative for pallor or rash   Neurological: Negative for weakness, light-headedness, numbness and headaches.  Hematological: Negative for adenopathy. Does not  bruise/bleed easily.  Psychiatric/Behavioral: Negative for dysphoric mood. The patient is not nervous/anxious.          Objective:   Physical Exam  Constitutional: He appears well-developed and well-nourished. No distress.  HENT:  Head: Normocephalic and atraumatic.  Right Ear: External ear normal.  Left Ear: External ear normal.  Mouth/Throat: No oropharyngeal exudate.       Nares are injected and congested   No sinus tenderness Throat clear with some post nasal drainage   Eyes: Conjunctivae and EOM are normal. Pupils are equal, round, and reactive to light. Right eye exhibits no discharge. Left eye exhibits no discharge.  Neck: Normal range of motion. Neck supple. No JVD present. No thyromegaly present.  Cardiovascular: Normal rate, regular rhythm, normal heart sounds and intact distal pulses.   Pulmonary/Chest: Effort normal. No respiratory distress. He has wheezes. He has no rales. He exhibits no tenderness.       Diffusely distant bs  Harsh bs at bases with wheeze on forced exp only  No rales  Few scattered rhonchi No sob   Lymphadenopathy:    He has no cervical adenopathy.  Skin: Skin is warm and dry. No rash noted.  Psychiatric: He has a normal mood and affect.       Stoic affect  Answers questions appropriately          Assessment & Plan:

## 2011-10-26 NOTE — Patient Instructions (Signed)
Drink lots of fluids Take the zithromax as directed  Use inhaler if needed for wheezing -- if wheezing worsens, please call  I recommend nasal saline spray for nasal congestion  Also mucinex DM for cough and congestion

## 2011-10-28 ENCOUNTER — Other Ambulatory Visit (INDEPENDENT_AMBULATORY_CARE_PROVIDER_SITE_OTHER): Payer: Medicare Other

## 2011-10-28 DIAGNOSIS — E119 Type 2 diabetes mellitus without complications: Secondary | ICD-10-CM

## 2011-10-28 DIAGNOSIS — E7800 Pure hypercholesterolemia, unspecified: Secondary | ICD-10-CM

## 2011-10-28 DIAGNOSIS — K279 Peptic ulcer, site unspecified, unspecified as acute or chronic, without hemorrhage or perforation: Secondary | ICD-10-CM

## 2011-10-28 LAB — BASIC METABOLIC PANEL
BUN: 17 mg/dL (ref 6–23)
Calcium: 9.6 mg/dL (ref 8.4–10.5)
Creatinine, Ser: 1.1 mg/dL (ref 0.4–1.5)
GFR: 71.36 mL/min (ref >60.00)
Glucose, Bld: 108 mg/dL — ABNORMAL HIGH (ref 70–99)

## 2011-10-28 LAB — LIPID PANEL
Cholesterol: 134 mg/dL (ref 0–200)
HDL: 38 mg/dL — ABNORMAL LOW (ref >39.00)
LDL Cholesterol: 77 mg/dL (ref 0–99)
Triglycerides: 96 mg/dL (ref 0.0–149.0)
VLDL: 19.2 mg/dL (ref 0.0–40.0)

## 2011-10-28 LAB — MICROALBUMIN / CREATININE URINE RATIO: Microalb Creat Ratio: 1.2 mg/g (ref 0.0–30.0)

## 2011-10-28 LAB — CBC WITH DIFFERENTIAL/PLATELET
Basophils Absolute: 0 10*3/uL (ref 0.0–0.1)
HCT: 37.5 % — ABNORMAL LOW (ref 39.0–52.0)
Hemoglobin: 12.8 g/dL — ABNORMAL LOW (ref 13.0–17.0)
Lymphs Abs: 2.6 10*3/uL (ref 0.7–4.0)
MCHC: 34.3 g/dL (ref 30.0–36.0)
MCV: 90.4 fl (ref 78.0–100.0)
Monocytes Absolute: 0.6 10*3/uL (ref 0.1–1.0)
Monocytes Relative: 9.2 % (ref 3.0–12.0)
Neutro Abs: 3.4 10*3/uL (ref 1.4–7.7)
RDW: 14.5 % (ref 11.5–14.6)

## 2011-10-28 LAB — HEMOGLOBIN A1C: Hgb A1c MFr Bld: 6.8 % — ABNORMAL HIGH (ref 4.6–6.5)

## 2011-10-31 ENCOUNTER — Ambulatory Visit (INDEPENDENT_AMBULATORY_CARE_PROVIDER_SITE_OTHER): Payer: Medicare Other | Admitting: Family Medicine

## 2011-10-31 ENCOUNTER — Encounter: Payer: Self-pay | Admitting: Family Medicine

## 2011-10-31 VITALS — BP 136/82 | HR 60 | Temp 98.3°F | Wt 182.0 lb

## 2011-10-31 DIAGNOSIS — E7800 Pure hypercholesterolemia, unspecified: Secondary | ICD-10-CM

## 2011-10-31 DIAGNOSIS — J209 Acute bronchitis, unspecified: Secondary | ICD-10-CM

## 2011-10-31 DIAGNOSIS — D696 Thrombocytopenia, unspecified: Secondary | ICD-10-CM

## 2011-10-31 DIAGNOSIS — E119 Type 2 diabetes mellitus without complications: Secondary | ICD-10-CM

## 2011-10-31 DIAGNOSIS — K2270 Barrett's esophagus without dysplasia: Secondary | ICD-10-CM

## 2011-10-31 DIAGNOSIS — F17200 Nicotine dependence, unspecified, uncomplicated: Secondary | ICD-10-CM

## 2011-10-31 DIAGNOSIS — J449 Chronic obstructive pulmonary disease, unspecified: Secondary | ICD-10-CM

## 2011-10-31 NOTE — Assessment & Plan Note (Signed)
Chronic PPI.  Check B12 . Rpt EGD due 2014.

## 2011-10-31 NOTE — Assessment & Plan Note (Signed)
Addressed smoking - encouraged cessation.  Pt somewhat defensive. precontemplative.

## 2011-10-31 NOTE — Patient Instructions (Signed)
Return in 3 months for labwork to recheck blood counts. Good to see you today, call us with questions. Return in 6 months for physical.

## 2011-10-31 NOTE — Progress Notes (Signed)
  Subjective:    Patient ID: Troy [name], male    DOB: 12/10/1932, 78 y.o.   MRN: [id number]  HPI CC: 6 mo f/u.  78 yo with h/o DM, CAD, smoker, COPD by CXR, BPH, h/o GI ulcer and barrett's esophagus presents for 6 mo f/u.  Seen at Saturday clinic with bronchitis with bronchospasm and treated with zpack.  Doing better from cough perspective.  DM - doesn't check sugars regularly.  No lows or hypoglycemic sxs.  Last checked 2-3 wks ago and doesn't remember #.  Last vision check was 06/2011, told normal.  No need for glasses.  No paresthesias.  HTN - tolerating meds ok.  No HA, vision changes, CP/tightness, SOB, leg swelling.   BP Readings from Last 3 Encounters:  10/31/11 136/82  10/26/11 120/60  09/16/11 112/70   Smoking - 1 ppd.  Precontemplative.  Not currently interested in quitting.  Thrombocytopenia - has had present in past, unsure if has had workup.  Denies bleeding.  Denies EtOH use.  Did have 1 fall this past year, fell down steps in rain but no injury and no further fall. Denies anhedonia, depression.  Past Medical History  Diagnosis Date  . Coronary atherosclerosis of artery bypass graft   . HLD (hyperlipidemia)   . Tobacco use disorder   . Hypertrophy of prostate without urinary obstruction and other lower urinary tract symptoms (LUTS)   . Diabetes mellitus type II ~2008  . Gastrointestinal ulcer   . Prostate nodule     Left-benign s/p eval Uro WNL (Ottelin)  . Chronic obstructive pulmonary disease (COPD)     on CXR  . Barrett's esophagus     EGD 2012, rpt 2014, longterm PPI  . Thrombocytopenia     Review of Systems Per HPI    Objective:   Physical Exam  Nursing note and vitals reviewed. Constitutional: He appears well-developed and well-nourished. No distress.  HENT:  Head: Normocephalic and atraumatic.  Right Ear: External ear normal.  Left Ear: External ear normal.  Nose: Nose normal.  Mouth/Throat: Oropharynx is clear and moist. No  oropharyngeal exudate.  Eyes: Conjunctivae and EOM are normal. Pupils are equal, round, and reactive to light. No scleral icterus.  Neck: Normal range of motion. Neck supple. Carotid bruit is not present.  Cardiovascular: Normal rate, regular rhythm, normal heart sounds and intact distal pulses.   No murmur heard. Pulses:      Radial pulses are 2+ on the right side, and 2+ on the left side.       Dorsalis pedis pulses are 2+ on the right side, and 2+ on the left side.       Posterior tibial pulses are 2+ on the right side, and 2+ on the left side.  Pulmonary/Chest: Effort normal and breath sounds normal. No respiratory distress. He has no wheezes. He has no rales.  Musculoskeletal: Normal range of motion. He exhibits no edema.       Diabetic foot exam: Normal inspection No skin breakdown No calluses  Normal DP/PT pulses Normal sensation to light touch and monofilament Nails normal  Lymphadenopathy:    He has no cervical adenopathy.  Skin: Skin is warm and dry. No rash noted.  Psychiatric: He has a normal mood and affect.       stoic       Assessment & Plan:

## 2011-10-31 NOTE — Assessment & Plan Note (Signed)
Need to schedule spirometry to assess COPD.  Have discussed in past.

## 2011-10-31 NOTE — Assessment & Plan Note (Signed)
Ideal LDL <70.  H/o CABG Lab Results  Component Value Date   CHOL 134 10/28/2011   HDL 38.00* 10/28/2011   LDLCALC 77 10/28/2011   LDLDIRECT 83.1 04/23/2011   TRIG 96.0 10/28/2011   CHOLHDL 4 10/28/2011

## 2011-10-31 NOTE — Assessment & Plan Note (Addendum)
Lab Results  Component Value Date   PLT 127.0* 10/28/2011   Unsure if has had eval for this in past.  Mild thrombocytopenia.  On aspirin. Will recheck and check periph smear in 3 mo. New mild anemia.

## 2011-10-31 NOTE — Assessment & Plan Note (Signed)
Chronic, stable.  Reviewed blood work.  vision exam 06/2011.  Foot exam today. Lab Results  Component Value Date   HGBA1C 6.8* 10/28/2011

## 2011-10-31 NOTE — Assessment & Plan Note (Signed)
Improving nicely 

## 2011-12-19 ENCOUNTER — Encounter: Payer: Self-pay | Admitting: Family Medicine

## 2011-12-19 ENCOUNTER — Ambulatory Visit (INDEPENDENT_AMBULATORY_CARE_PROVIDER_SITE_OTHER): Payer: Medicare Other | Admitting: Family Medicine

## 2011-12-19 ENCOUNTER — Telehealth: Payer: Self-pay | Admitting: Family Medicine

## 2011-12-19 VITALS — BP 120/60 | HR 76 | Temp 99.1°F | Wt 175.5 lb

## 2011-12-19 DIAGNOSIS — J209 Acute bronchitis, unspecified: Secondary | ICD-10-CM

## 2011-12-19 DIAGNOSIS — J449 Chronic obstructive pulmonary disease, unspecified: Secondary | ICD-10-CM

## 2011-12-19 DIAGNOSIS — F17200 Nicotine dependence, unspecified, uncomplicated: Secondary | ICD-10-CM

## 2011-12-19 DIAGNOSIS — J4489 Other specified chronic obstructive pulmonary disease: Secondary | ICD-10-CM

## 2011-12-19 MED ORDER — DOXYCYCLINE HYCLATE 100 MG PO TABS: 100.0000 mg | ORAL_TABLET | Freq: Two times a day (BID) | ORAL | Status: AC

## 2011-12-19 NOTE — Progress Notes (Signed)
  Patient Name: Troy [name] Date of Birth: 02/06/1933 Age: 78 y.o. Medical Record Number: [id number] Gender: male Date of Encounter: 12/19/2011  History of Present Illness:  Troy [name] is a 78 y.o. very pleasant male patient who presents with the following:  Feels terrible, head is congested. Coughing and coughing a lot at night Started for a couple. No real -- feels weak and achy. Aching behind eyes.   1/2 PPD.  Acute Bronchitis: Patient presents for presents evaluation of myalgias, nasal congestion and productive cough with sputum described as yellow. Symptoms began 3 days ago and are unchanged since that time.  Past history is significant for COPD and smoking.    Past Medical History, Surgical History, Social History, Family History, Problem List, Medications, and Allergies have been reviewed and updated if relevant.  Review of Systems: ROS: GEN: Acute illness details above GI: Tolerating PO intake GU: maintaining adequate hydration and urination Pulm: No SOB Interactive and getting along well at home.  Otherwise, ROS is as per the HPI.   Physical Examination: Filed Vitals:   12/19/11 1106  BP: 120/60  Pulse: 76  Temp: 99.1 F (37.3 C)  TempSrc: Oral  Weight: 175 lb 8 oz (79.606 kg)  SpO2: 97%    There is no height on file to calculate BMI.   GEN: A and O x 3. WDWN. NAD.    ENT: Nose clear, ext NML.  No LAD.  No JVD.  TM's clear. Oropharynx clear.  PULM: Normal WOB, no distress. No crackles, rhonchi. Rare wheeze CV: RRR, no M/G/R, No rubs, No JVD.   EXT: warm and well-perfused, No c/c/e. PSYCH: Pleasant and conversant.   Assessment and Plan: 1. Bronchitis, acute   2. COPD, mild   3. TOBACCO ABUSE     Bronchitis with mild copd flare Albuterol at home  Meds ordered this encounter  Medications  . doxycycline (VIBRA-TABS) 100 MG tablet    Sig: Take 1 tablet (100 mg total) by mouth 2 (two) times daily.    Dispense:  20 tablet    Refill:  0

## 2011-12-19 NOTE — Telephone Encounter (Signed)
Caller: Khoen/Patient; PCP: Eustaquio Boyden; CB#: [phone]; Call regarding ?. Flu, Body Aches and Pain Behind His Eyes, Chills, Feels Like Fever and Cough. Onset: 12/18/11.  Temp 99.2 po at 0934. Thirsty, mild lightheadedness. States had annual flu shot.  Advised to see MD within 4 hrs for temp elevation in frail elderly person per Flu-Like Symtpoms Guideline.  Appt scheduled for 12/19/11 at 1115 with Dr Patsy Lager at [facility].

## 2011-12-19 NOTE — Telephone Encounter (Signed)
Noted. Thanks.

## 2011-12-23 ENCOUNTER — Ambulatory Visit (INDEPENDENT_AMBULATORY_CARE_PROVIDER_SITE_OTHER): Payer: Medicare Other | Admitting: Family Medicine

## 2011-12-23 ENCOUNTER — Encounter: Payer: Self-pay | Admitting: Family Medicine

## 2011-12-23 VITALS — BP 130/70 | HR 70 | Temp 98.1°F | Wt 173.0 lb

## 2011-12-23 DIAGNOSIS — M545 Low back pain: Secondary | ICD-10-CM

## 2011-12-23 DIAGNOSIS — M5417 Radiculopathy, lumbosacral region: Secondary | ICD-10-CM | POA: Insufficient documentation

## 2011-12-23 MED ORDER — DIAZEPAM 2 MG PO TABS: ORAL_TABLET | ORAL | Status: DC

## 2011-12-23 MED ORDER — BENZONATATE 100 MG PO CAPS: 100.0000 mg | ORAL_CAPSULE | Freq: Two times a day (BID) | ORAL | Status: AC | PRN

## 2011-12-23 NOTE — Progress Notes (Signed)
  Subjective:    Patient ID: Troy [name], male    DOB: 10/07/1932, 78 y.o.   MRN: [id number]  HPI CC: ?pulled muscle  Seen here 12/19/2011 with dx bronchitis and mild COPD flare, treated with doxycycline.  presents today because of worsening back pain when coughing (present for 10 d now). Located lower back and left side.  Sometimes feels like left foot numb.  Started one morning when putting on pants.  Denies fevers/chills, abd pain, dysuria, hematuria, n/v, radiculopathy down legs, bowel changes, bowel/bladder accidents.  Intolerant to codeine, flexeril, hydrocodone.  Smoking 1 ppd.  Has had a "weak back" in past.  Past Medical History  Diagnosis Date  . Coronary atherosclerosis of artery bypass graft   . HLD (hyperlipidemia)   . Tobacco use disorder   . Hypertrophy of prostate without urinary obstruction and other lower urinary tract symptoms (LUTS)   . Diabetes mellitus type II ~2008  . Gastrointestinal ulcer   . Prostate nodule     Left-benign s/p eval Uro WNL (Ottelin)  . Chronic obstructive pulmonary disease (COPD)     on CXR  . Barrett's esophagus     EGD 2012, rpt 2014, longterm PPI  . Thrombocytopenia      Review of Systems Per HPI    Objective:   Physical Exam  Nursing note and vitals reviewed. Constitutional: He appears well-developed and well-nourished. No distress.  Cardiovascular: Normal rate, regular rhythm, normal heart sounds and intact distal pulses.   No murmur heard. Pulmonary/Chest: Effort normal. No respiratory distress. He has wheezes. He has no rales.       Exp wheezing  Musculoskeletal:       No midline spine tenderness. no paraspinous mm tenderness. + mild SLR bilaterally but no radiculopathy with LBP. No pain with int/ext rotation at hips.  Neurological: He has normal strength.       Decreased DTRs BLE symmetrically       Assessment & Plan:

## 2011-12-23 NOTE — Assessment & Plan Note (Signed)
Anticipate some DDD/arthritis but do think current flare is due to muscle spasm/tightness exacerbated by recent increase in cough. Treat with valium (discussed sedation precautions) and tessalon perls (intolerant to flexeril and narcotics in general). Discussed use of aleve OTC for next few days to help with back pain - discussed GI precautions (h/o barrett's and remote ulcer). If not better, consider steroid course, if worsening, low threshold to image.

## 2011-12-23 NOTE — Patient Instructions (Addendum)
I think you have pulled muscle in back - causing muscle spasm. Take tessalon perls for cough as needed - swallow don't chew. Use over the counter aleve 2 pills twice daily for 5 days to see if we can calm down inflammation, but take with food as it can cause stomach upset.  If causing too much stomach upset, stop this medicine. May use valium 2mg  1-2 pills twice daily as needed for muscle spasm - watch as this can cause sedation and make you feel woozy - if that happens, stop and let me know. If not improving after 1-2 weeks, please let me know.  If worsening, please return sooner.

## 2011-12-27 ENCOUNTER — Telehealth: Payer: Self-pay | Admitting: Family Medicine

## 2011-12-27 MED ORDER — PREDNISONE 20 MG PO TABS: ORAL_TABLET | ORAL | Status: DC

## 2011-12-27 NOTE — Telephone Encounter (Signed)
Patient Name: Troy [name] Call Date & Time: 12/27/2011 12:21:41PM Patient Phone: [phone] PCP: Eustaquio Boyden Patient Gender: Male PCP Fax : [phone] Patient DOB: October 09, 1932 Practice Name: [facility] Reason for Call: Caller: [name]/Patient; PCP: Eustaquio Boyden; CB#: [phone]; ; ; Call regarding Hip Pain; Pt was seen on 12/23/11 for hip/back pain. Pt was prescribed Valium which he says are no helping. He was instructed to call back in no improvement. Does he need an x-ray. Rn reviewed Epic and called back line/Phylis who advised we could send a request on this pt. OFFICE PLEASE CALL PT BACK AT [phone] Protocol(s) Used: Hip Non-Injury Recommended Outcome per Protocol: See Provider within 4 hours Reason for Outcome: Any signs and symptoms of localized infection that has not improved or is worsening with 24 hours of home care or area of involvement is extending Care Advice: ~ Wash the affected area 2-3 times a day with a mild soap. ~ SYMPTOM / CONDITION MANAGEMENT Apply local moist heat (such as a warm, wet wash cloth covered with plastic wrap) to the area for 15-20 minutes every 2-3 hours while awake. ~ ~ Support part in position of comfort to reduce pain and swelling; avoid unnecessary movement.

## 2011-12-27 NOTE — Telephone Encounter (Signed)
Spoke with pt.  Not better with valium, caused sedation. Will treat with short course of steroids. Stop aleve. Discussed watching for GI side effects as well as hyperactivity. If not better asked him to return for OV and xrays.

## 2011-12-30 ENCOUNTER — Telehealth: Payer: Self-pay | Admitting: Family Medicine

## 2011-12-30 ENCOUNTER — Encounter: Payer: Self-pay | Admitting: Family Medicine

## 2011-12-30 ENCOUNTER — Ambulatory Visit (INDEPENDENT_AMBULATORY_CARE_PROVIDER_SITE_OTHER): Payer: Medicare Other | Admitting: Family Medicine

## 2011-12-30 VITALS — BP 138/70 | HR 56 | Temp 98.0°F | Wt 176.8 lb

## 2011-12-30 DIAGNOSIS — M5432 Sciatica, left side: Secondary | ICD-10-CM

## 2011-12-30 DIAGNOSIS — M5430 Sciatica, unspecified side: Secondary | ICD-10-CM

## 2011-12-30 NOTE — Telephone Encounter (Signed)
Triage Record Num: [id number] Operator: Lesli Albee Patient Name: Troy [name] Call Date & Time: 12/27/2011 12:21:41PM Patient Phone: [phone] PCP: Eustaquio Boyden Patient Gender: Male PCP Fax : [phone] Patient DOB: [birth year] Practice Name: [facility] Reason for Call: Caller: [name]/Patient; PCP: Eustaquio Boyden; CB#: [phone]; ; ; Call regarding Hip Pain; Pt was seen on 12/23/11 for hip/back pain. Pt was prescribed Valium which he says are no helping. He was instructed to call back in no improvement. Does he need an x-ray. Rn reviewed Epic and called back line/Phylis who advised we could send a request on this pt. OFFICE PLEASE CALL PT BACK AT [phone] Protocol(s) Used: Hip Non-Injury Recommended Outcome per Protocol: See Provider within 4 hours Reason for Outcome: Any signs and symptoms of localized infection that has not improved or is worsening with 24 hours of home care or area of involvement is extending Care Advice: ~ Wash the affected area 2-3 times a day with a mild soap. ~ SYMPTOM / CONDITION MANAGEMENT Apply local moist heat (such as a warm, wet wash cloth covered with plastic wrap) to the area for 15-20 minutes every 2-3 hours while awake. ~ ~ Support part in position of comfort to reduce pain and swelling; avoid unnecessary movement. 12/27/2011 12:37:49PM Page 1 of 1 [email or web address]

## 2011-12-30 NOTE — Progress Notes (Signed)
  Subjective:    Patient ID: Troy [name], male    DOB: 23-May-1933, 78 y.o.   MRN: [id number]  HPI CC: L hip pain  Seen here 12/23/2011 with L LBP after cough, thought strained muscle and placed on valium which he did not tolerate well so placed on steroid course.  Steroids are no help.  Now 3-4d h/o pain in left hip and traveling down leg.  Down to ankles.  Feels some numbness in leg.  No alleviating factors.  Sitting prolonged makes pain worse.  No bowel/bladder incontinence, fevers, chills.  Denies inciting falls/trauma/injury.  Never had anything like this before.  Lab Results  Component Value Date   PSA 0.80 04/23/2011  s/p benign prostate biopsy 2/2 nodule late 2000s by Dr. Vernie Ammons.  Past Medical History  Diagnosis Date  . Coronary atherosclerosis of artery bypass graft   . HLD (hyperlipidemia)   . Tobacco use disorder   . Hypertrophy of prostate without urinary obstruction and other lower urinary tract symptoms (LUTS)   . Diabetes mellitus type II ~2008  . Gastrointestinal ulcer   . Prostate nodule     Left-benign s/p eval Uro WNL (Ottelin)  . Chronic obstructive pulmonary disease (COPD)     on CXR  . Barrett's esophagus     EGD 2012, rpt 2014, longterm PPI  . Thrombocytopenia     Review of Systems Per HPI    Objective:   Physical Exam  Nursing note and vitals reviewed. Constitutional: He appears well-developed and well-nourished. No distress.  HENT:  Head: Normocephalic and atraumatic.  Musculoskeletal:       No midline spine or paraspinous mm tenderness Neg SLR bilaterally. No pain with int/ext rot of hip at hip Neg FABER + pain with palpation at left sciatic notch. No pain with palpation SIJ or GTB bilaterally.  Neurological:       Diminished DTRs bilaterally. Strength decreased 2/2 pain on left       Assessment & Plan:

## 2011-12-30 NOTE — Assessment & Plan Note (Signed)
Exam and story consistent with left sciatica. rec continue steroid course, as well as tylenol, ice/heat, and stretching exercises provided from [facility] pt advisor on piriformis syndrome. If not improve, refer to PT.  If worsening, consider imaging. Does not tolerate narcotics at all.  H/o barrett's and remote ulcer.

## 2011-12-30 NOTE — Patient Instructions (Signed)
You have sciatica irritation on left side. Finish steroid course. May use tylenol 500mg  2 pills 2-3 times daily. Use ice alternating with heat to left buttock Stretching exercises provided. If not better in 2-3 days, call me for referral to physical therapy. If worsening, please let me know sooner.

## 2011-12-30 NOTE — Telephone Encounter (Signed)
Seen today. 

## 2012-01-09 ENCOUNTER — Ambulatory Visit (INDEPENDENT_AMBULATORY_CARE_PROVIDER_SITE_OTHER): Payer: Medicare Other | Admitting: Family Medicine

## 2012-01-09 ENCOUNTER — Encounter: Payer: Self-pay | Admitting: Family Medicine

## 2012-01-09 ENCOUNTER — Ambulatory Visit (INDEPENDENT_AMBULATORY_CARE_PROVIDER_SITE_OTHER)
Admission: RE | Admit: 2012-01-09 | Discharge: 2012-01-09 | Disposition: A | Discharge status: Home / Self Care | Payer: Medicare Other | Source: Ambulatory Visit | Attending: Family Medicine | Admitting: Family Medicine

## 2012-01-09 VITALS — BP 110/60 | HR 72 | Temp 97.1°F | Wt 173.2 lb

## 2012-01-09 DIAGNOSIS — M5430 Sciatica, unspecified side: Secondary | ICD-10-CM

## 2012-01-09 DIAGNOSIS — M5432 Sciatica, left side: Secondary | ICD-10-CM

## 2012-01-09 MED ORDER — NAPROXEN 500 MG PO TABS: ORAL_TABLET | ORAL | Status: DC

## 2012-01-09 NOTE — Patient Instructions (Addendum)
Xray today - looking ok but I want radiology to check one area - we will call you with plan. Return in 1 week for recheck foot weakness. Take aleve over the counter or prescription naprosyn twice daily with food for 5-7 days then as needed, and make sure to drink plenty of water. Continue walking regularly during day

## 2012-01-09 NOTE — Assessment & Plan Note (Addendum)
Now with foot drop and some residual dorsal foot numbness, concern for L5 radiculopathy. Order xrays of lumbar spine today - possible compression lumbar region, await rad read. Given changing neurological status, will follow closely, rec pt return 1 wk for rpt exam, specifically discussed reasons to return sooner. If not improving, low threshold to order MRI. may use naprosyn either 500mg  or aleve otc prn pain, encouraged continued walking as helping.

## 2012-01-09 NOTE — Progress Notes (Signed)
HPI Review of Systems  Physical Exam Subjective:    Patient ID: Troy [name], male    DOB: 02-16-1933, 78 y.o.   MRN: [id number]  HPI CC: continued L hip pain  Seen here 4/8 with L LBP after cough, thought strained muscle and placed on valium which he did not tolerate well so placed on steroid course.  Steroids are no help.  Then seen 4/15 with 3-4d h/o pain in left hip and traveling down leg.  Down to ankles.  Feels some numbness in leg.  No alleviating factors.  Sitting prolonged makes pain worse.  No bowel/bladder incontinence, fevers, chills.  Denies inciting falls/trauma/injury.  Never had anything like this before.  Thought L sciatica, recommended continued steroid course, as well as tylenol, ice/heat, and stretching exercises provided from [facility] pt advisor on piriformis syndrome.    Last night left foot felt numb but when awoke felt better.  No shooting pain with numbness.  Standing and walking improves pain, sitting worsens pain.  Walking helps more than stretching exercises.  sxs going on 4 wks.  Does not tolerate narcotics at all. H/o barrett's and remote ulcer.  Did not tolerate muscle relaxants well in past.   Lab Results  Component Value Date   PSA 0.80 04/23/2011  s/p benign prostate biopsy 2/2 nodule late 2000s by Dr. Vernie Ammons.  Past Medical History  Diagnosis Date  . Coronary atherosclerosis of artery bypass graft   . HLD (hyperlipidemia)   . Tobacco use disorder   . Hypertrophy of prostate without urinary obstruction and other lower urinary tract symptoms (LUTS)   . Diabetes mellitus type II ~2008  . Gastrointestinal ulcer   . Prostate nodule     Left-benign s/p eval Uro WNL (Ottelin)  . Chronic obstructive pulmonary disease (COPD)     on CXR  . Barrett's esophagus     EGD 2012, rpt 2014, longterm PPI  . Thrombocytopenia     Review of Systems Per HPI    Objective:   Physical Exam  Nursing note and vitals reviewed. Constitutional: He appears  well-developed and well-nourished. No distress.  HENT:  Head: Normocephalic and atraumatic.  Musculoskeletal:       No midline spine or paraspinous mm tenderness Neg SLR bilaterally. No pain with int/ext rot of hip at hip Neg FABER + pain with palpation at left sciatic notch. No pain with palpation SIJ or GTB bilaterally.  Neurological:       Diminished DTRs bilaterally. noted weakness in left foot dorsiflexors, unable to heel walk on left.  Strength otherwise intact Diminished temperature sensation dorsal foot, slightly diminished sensation to light touch      Assessment & Plan:

## 2012-01-21 ENCOUNTER — Encounter: Payer: Self-pay | Admitting: Family Medicine

## 2012-01-21 ENCOUNTER — Ambulatory Visit (INDEPENDENT_AMBULATORY_CARE_PROVIDER_SITE_OTHER): Payer: Medicare Other | Admitting: Family Medicine

## 2012-01-21 VITALS — BP 124/68 | HR 64 | Temp 97.3°F | Wt 176.0 lb

## 2012-01-21 DIAGNOSIS — M5432 Sciatica, left side: Secondary | ICD-10-CM

## 2012-01-21 DIAGNOSIS — M79609 Pain in unspecified limb: Secondary | ICD-10-CM

## 2012-01-21 DIAGNOSIS — M79605 Pain in left leg: Secondary | ICD-10-CM

## 2012-01-21 DIAGNOSIS — M5430 Sciatica, unspecified side: Secondary | ICD-10-CM

## 2012-01-21 NOTE — Patient Instructions (Signed)
Pass by [facility]'s office for MRI  Depending on results we will discuss next step.

## 2012-01-21 NOTE — Progress Notes (Signed)
  Subjective:    Patient ID: Troy [name], male    DOB: [birth year], 78 y.o.   MRN: [id number]  HPI CC: continued L hip pain  Seen 4/8 with L LBP after cough, thought strained muscle and placed on valium which he did not tolerate well so placed on steroid course. Steroids are no help.  Seen 4/15 with 3-4d h/o pain in left hip and traveling down leg. Down to ankles. Feels some numbness in leg. No alleviating factors. Sitting prolonged makes pain worse.  No bowel/bladder incontinence, fevers, chills. Denies inciting falls/trauma/injury. Never had anything like this before.  Thought L sciatica, recommended continued steroid course, as well as tylenol, ice/heat, and stretching exercises provided from [facility] pt advisor on piriformis syndrome.   Last visit found to have some foot drop on left as well as dorsal foot numbness, concern for L5 radiculopathy.    Asked to return today for re exam and decision on MRI.  Pain only when sitting.  Not when standing and walking.  Continued foot numbness throughout entire foot now.  Calf pain more pronounced now. Standing and walking improves pain, sitting worsens pain. Walking helps more than stretching exercises. sxs going on >1 mo. Does not tolerate narcotics at all. H/o barrett's and remote ulcer. Did not tolerate muscle relaxants well in past.  NSAID (naprosyn) and steroids (prednisone taper) did not help at all.  Past Medical History  Diagnosis Date  . Coronary atherosclerosis of artery bypass graft   . HLD (hyperlipidemia)   . Tobacco use disorder   . Hypertrophy of prostate without urinary obstruction and other lower urinary tract symptoms (LUTS)   . Diabetes mellitus type II ~2008  . Gastrointestinal ulcer   . Prostate nodule ~2009    Left-benign s/p eval Uro WNL (Ottelin)  . Chronic obstructive pulmonary disease (COPD)     on CXR  . Barrett's esophagus     EGD 2012, rpt 2014, longterm PPI  . Thrombocytopenia     Review of Systems Per HPI      Objective:   Physical Exam  Nursing note and vitals reviewed. Constitutional: He is oriented to person, place, and time. He appears well-developed and well-nourished. No distress.  HENT:  Head: Normocephalic and atraumatic.  Musculoskeletal:       No midline spine or paraspinous mm tenderness Neg SLR bilaterally. No pain with int/ext rot of hip at hip Neg FABER + pain with palpation at left sciatic notch. No pain with palpation SIJ or GTB bilaterally. No calf pain. No periph edema  Neurological: He is alert and oriented to person, place, and time. No sensory deficit. Coordination normal.       Diminished DTRs bilaterally upper and lower extremities. Dorsiflexion weakness noted on left (4/5). Unable to heel walk on left.  Toe walking intact. 2+ PT bilaterally, about 1+ DP Difficulty with 2 point discrimination bilateral feet. Numbness endorsed dorsal left foot as well as entire toes       Assessment & Plan:

## 2012-01-21 NOTE — Assessment & Plan Note (Addendum)
With progressing foot drop and dorsal foot numbness.  Concern for L5 radiculopathy. Imaging showing DDD but nonfocal to L5.   Going on 1 mo.  Steroids and anti inflammatories tried and failed, narcotic analgesics unable to take 2/2 intolerances. Will need MRI to rule out other lesion, and likely referral to neurosurg vs neuro for NCS depending on MRI results.

## 2012-01-25 ENCOUNTER — Ambulatory Visit
Admission: RE | Admit: 2012-01-25 | Discharge: 2012-01-25 | Disposition: A | Discharge status: Home / Self Care | Payer: Medicare Other | Source: Ambulatory Visit | Attending: Family Medicine | Admitting: Family Medicine

## 2012-01-25 DIAGNOSIS — M79605 Pain in left leg: Secondary | ICD-10-CM

## 2012-01-25 DIAGNOSIS — M5432 Sciatica, left side: Secondary | ICD-10-CM

## 2012-01-26 ENCOUNTER — Other Ambulatory Visit: Payer: Self-pay | Admitting: Family Medicine

## 2012-01-26 DIAGNOSIS — M5417 Radiculopathy, lumbosacral region: Secondary | ICD-10-CM

## 2012-01-27 ENCOUNTER — Encounter: Payer: Self-pay | Admitting: Family Medicine

## 2012-01-29 ENCOUNTER — Telehealth: Payer: Self-pay

## 2012-01-29 ENCOUNTER — Other Ambulatory Visit: Payer: Self-pay | Admitting: Neurosurgery

## 2012-01-29 NOTE — Telephone Encounter (Signed)
Form completed and faxed yesterday. Duplicate form.

## 2012-01-29 NOTE — Telephone Encounter (Signed)
[facility] faxed form for disease management program which is in your in box.

## 2012-01-30 ENCOUNTER — Encounter (HOSPITAL_COMMUNITY)
Admission: RE | Admit: 2012-01-30 | Discharge: 2012-01-30 | Disposition: A | Discharge status: Home / Self Care | Payer: Medicare Other | Source: Ambulatory Visit | Attending: Neurosurgery | Admitting: Neurosurgery

## 2012-01-30 ENCOUNTER — Encounter (HOSPITAL_COMMUNITY): Payer: Self-pay | Admitting: Pharmacy Technician

## 2012-01-30 ENCOUNTER — Encounter (HOSPITAL_COMMUNITY): Payer: Self-pay

## 2012-01-30 HISTORY — DX: Gastro-esophageal reflux disease without esophagitis: K21.9

## 2012-01-30 LAB — BASIC METABOLIC PANEL
BUN: 20 mg/dL (ref 6–23)
CO2: 23 mEq/L (ref 19–32)
Chloride: 105 mEq/L (ref 96–112)
Glucose, Bld: 121 mg/dL — ABNORMAL HIGH (ref 70–99)
Potassium: 4.2 mEq/L (ref 3.5–5.1)

## 2012-01-30 LAB — CBC
HCT: 39.5 % (ref 39.0–52.0)
Hemoglobin: 13.5 g/dL (ref 13.0–17.0)
MCH: 30.7 pg (ref 26.0–34.0)
MCHC: 34.2 g/dL (ref 30.0–36.0)

## 2012-01-30 MED ORDER — CEFAZOLIN SODIUM 1-5 GM-% IV SOLN
1.0000 g | INTRAVENOUS | Status: DC
  Filled 2012-01-30: qty 50

## 2012-01-30 NOTE — Pre-Procedure Instructions (Addendum)
20 AWAD GLADD  01/30/2012    Your procedure is scheduled on:  Friday May 17  Report to [facility] at 2:00 PM.  Call this number if you have problems the morning of surgery: [phone]   Remember:   Do not eat food:After Midnight.  May have clear liquids: up to 4 Hours before arrival.  Clear liquids include soda, tea, black coffee, apple or grape juice, broth.  Take these medicines the morning of surgery with A SIP OF WATER: Omeprazole, Imdur, Metoprolol, Amlodipine. Tylenol if needed. May use albuterol inhaler. Bring Nitro to surgery in case needed.    Do not wear jewelry, make-up or nail polish.  Do not wear lotions, powders, or perfumes. You may wear deodorant.  Do not shave 48 hours prior to surgery. Men may shave face and neck.  Do not bring valuables to the hospital.  Contacts, dentures or bridgework may not be worn into surgery.  Leave suitcase in the car. After surgery it may be brought to your room.  For patients admitted to the hospital, checkout time is 11:00 AM the day of discharge.   Patients discharged the day of surgery will not be allowed to drive home.  Name and phone number of your driver: NA  Special Instructions: CHG Shower Use Special Wash: 1/2 bottle night before surgery and 1/2 bottle morning of surgery.   Please read over the following fact sheets that you were given: Pain Booklet, Coughing and Deep Breathing and Surgical Site Infection Prevention

## 2012-01-30 NOTE — Consult Note (Signed)
Anesthesia Consult:  Patient is a 78 year old male posted for a left L4-5 diskectomy on 01/31/12.  His PAT appointment was scheduled for 01/30/12.  He has had left foot drop symptoms.    History is significant for CAD/CABG ~ 10 years ago, DM2, HLD, BPH, smoking, thrombocytopenia, GERD, Barrett's esophagus.  His PCP is Dr. Sharen Hones who referred patient for NS evaluation.  His Cardiologist is Dr. Daleen Squibb, with his last visit being on 04/26/10.  EKG on 01/30/12 showed SB @ 56 bpm, Q wave in III but not in other inferior leads.  Later ST/T wave changes, probably nonspecific.  These appear slightly more prominent from his EKG on 04/26/10, but otherwise I don't think it is significantly changed.  Nuclear Study on 05/31/2008 showed: Excerise capacity: Adenosine with low level exercise  Blood Pressure response: Normal blood pressure response  Clinical symptoms: Atypical chest pain  ECG impression: No significant ST segment change suggestive of ischemia  Overall impression: Normal stress nuclear study   His last cath was on 12/04/04 and showed: 1. Normal left ventricular systolic function.  2. Native three-vessel coronary artery disease.  3. Status post coronary bypass surgery. All grafts were patent. The left circumflex is not grafted; however, the disease in the circumflex is moderate in severity and does not appear to be hemodynamically significant.  RECOMMENDATIONS: For continued medical therapy.  Patient denied CP.  He has not had to use Nitro.  He also denies SOB, edema.  He was able to do yard work (mow, [employer]) yesterday (against the advise of Dr. Franky Macho).  Exam shows normal body habitus, heart RRR, no murmur noted, lungs diminished but clear, no LE edema or carotid bruits auscultated.    CXR on 01/30/12 showed: 1. No acute cardiopulmonary process.  2. Emphysema  CBC and BMET noted.  PLT count WNL.    I reviewed above with Anesthesiologist Dr. Michelle Piper.  Plan to proceed if patient remains  asymptomatic from a CV standpoint.  Darl Pikes at Dr. Sueanne Margarita office notified that patient is still on his baby ASA, with plans to hold in the morning.  Shonna Chock, PA-C

## 2012-01-31 ENCOUNTER — Encounter (HOSPITAL_COMMUNITY): Payer: Self-pay | Admitting: Vascular Surgery

## 2012-01-31 ENCOUNTER — Other Ambulatory Visit (INDEPENDENT_AMBULATORY_CARE_PROVIDER_SITE_OTHER): Payer: Medicare Other

## 2012-01-31 ENCOUNTER — Inpatient Hospital Stay (HOSPITAL_COMMUNITY): Payer: Medicare Other | Admitting: Vascular Surgery

## 2012-01-31 ENCOUNTER — Encounter (HOSPITAL_COMMUNITY)
Admission: RE | Disposition: A | Discharge status: Home / Self Care | Payer: Self-pay | Source: Ambulatory Visit | Attending: Neurosurgery

## 2012-01-31 ENCOUNTER — Ambulatory Visit (HOSPITAL_COMMUNITY)
Admission: RE | Admit: 2012-01-31 | Discharge: 2012-02-01 | Disposition: A | Discharge status: Home / Self Care | Payer: Medicare Other | Source: Ambulatory Visit | Attending: Neurosurgery | Admitting: Neurosurgery

## 2012-01-31 ENCOUNTER — Inpatient Hospital Stay (HOSPITAL_COMMUNITY): Payer: Medicare Other

## 2012-01-31 ENCOUNTER — Encounter (HOSPITAL_COMMUNITY): Payer: Self-pay | Admitting: *Deleted

## 2012-01-31 DIAGNOSIS — J4489 Other specified chronic obstructive pulmonary disease: Secondary | ICD-10-CM | POA: Insufficient documentation

## 2012-01-31 DIAGNOSIS — D696 Thrombocytopenia, unspecified: Secondary | ICD-10-CM

## 2012-01-31 DIAGNOSIS — E119 Type 2 diabetes mellitus without complications: Secondary | ICD-10-CM | POA: Insufficient documentation

## 2012-01-31 DIAGNOSIS — J449 Chronic obstructive pulmonary disease, unspecified: Secondary | ICD-10-CM | POA: Insufficient documentation

## 2012-01-31 DIAGNOSIS — M5126 Other intervertebral disc displacement, lumbar region: Principal | ICD-10-CM | POA: Insufficient documentation

## 2012-01-31 DIAGNOSIS — Z01810 Encounter for preprocedural cardiovascular examination: Secondary | ICD-10-CM | POA: Insufficient documentation

## 2012-01-31 DIAGNOSIS — Z01812 Encounter for preprocedural laboratory examination: Secondary | ICD-10-CM | POA: Insufficient documentation

## 2012-01-31 DIAGNOSIS — Z01818 Encounter for other preprocedural examination: Secondary | ICD-10-CM | POA: Insufficient documentation

## 2012-01-31 DIAGNOSIS — F17200 Nicotine dependence, unspecified, uncomplicated: Secondary | ICD-10-CM | POA: Insufficient documentation

## 2012-01-31 DIAGNOSIS — K2270 Barrett's esophagus without dysplasia: Secondary | ICD-10-CM | POA: Insufficient documentation

## 2012-01-31 HISTORY — PX: LUMBAR LAMINECTOMY/DECOMPRESSION MICRODISCECTOMY: SHX5026

## 2012-01-31 LAB — GLUCOSE, CAPILLARY: Glucose-Capillary: 97 mg/dL (ref 70–99)

## 2012-01-31 SURGERY — LUMBAR LAMINECTOMY/DECOMPRESSION MICRODISCECTOMY 1 LEVEL
Anesthesia: General | Site: Back | Laterality: Left | Wound class: Clean

## 2012-01-31 MED ORDER — THROMBIN 5000 UNITS EX SOLR
CUTANEOUS | Status: DC | PRN
  Administered 2012-01-31 (×2): 5000 [IU] via TOPICAL

## 2012-01-31 MED ORDER — ONDANSETRON HCL 4 MG/2ML IJ SOLN: 4.0000 mg | INTRAMUSCULAR | Status: DC | PRN

## 2012-01-31 MED ORDER — ISOSORBIDE MONONITRATE 15 MG HALF TABLET
15.0000 mg | ORAL_TABLET | Freq: Every day | ORAL | Status: DC
  Filled 2012-01-31: qty 1

## 2012-01-31 MED ORDER — HEMOSTATIC AGENTS (NO CHARGE) OPTIME
TOPICAL | Status: DC | PRN
  Administered 2012-01-31: 1 via TOPICAL

## 2012-01-31 MED ORDER — DIAZEPAM 5 MG PO TABS: 5.0000 mg | ORAL_TABLET | Freq: Four times a day (QID) | ORAL | Status: DC | PRN

## 2012-01-31 MED ORDER — KETOROLAC TROMETHAMINE 30 MG/ML IJ SOLN
30.0000 mg | Freq: Four times a day (QID) | INTRAMUSCULAR | Status: DC
  Administered 2012-02-01: 30 mg via INTRAVENOUS
  Filled 2012-01-31 (×5): qty 1

## 2012-01-31 MED ORDER — OXYCODONE-ACETAMINOPHEN 5-325 MG PO TABS: 1.0000 | ORAL_TABLET | ORAL | Status: DC | PRN

## 2012-01-31 MED ORDER — ACETAMINOPHEN 650 MG RE SUPP: 650.0000 mg | RECTAL | Status: DC | PRN

## 2012-01-31 MED ORDER — MUPIROCIN 2 % EX OINT: TOPICAL_OINTMENT | Freq: Two times a day (BID) | CUTANEOUS | Status: AC

## 2012-01-31 MED ORDER — MENTHOL 3 MG MT LOZG: 1.0000 | LOZENGE | OROMUCOSAL | Status: DC | PRN

## 2012-01-31 MED ORDER — MUPIROCIN 2 % EX OINT
TOPICAL_OINTMENT | Freq: Two times a day (BID) | CUTANEOUS | Status: DC
  Administered 2012-02-01: via NASAL

## 2012-01-31 MED ORDER — METOPROLOL TARTRATE 25 MG PO TABS
25.0000 mg | ORAL_TABLET | Freq: Two times a day (BID) | ORAL | Status: DC
  Administered 2012-02-01: 25 mg via ORAL
  Filled 2012-01-31 (×3): qty 1

## 2012-01-31 MED ORDER — SUFENTANIL CITRATE 50 MCG/ML IV SOLN
INTRAVENOUS | Status: DC | PRN
  Administered 2012-01-31: 10 ug via INTRAVENOUS
  Administered 2012-01-31: 20 ug via INTRAVENOUS

## 2012-01-31 MED ORDER — CEFAZOLIN SODIUM 1-5 GM-% IV SOLN
INTRAVENOUS | Status: DC | PRN
  Administered 2012-01-31: 1 g via INTRAVENOUS

## 2012-01-31 MED ORDER — HYDROMORPHONE HCL PF 1 MG/ML IJ SOLN: 0.2500 mg | INTRAMUSCULAR | Status: DC | PRN

## 2012-01-31 MED ORDER — NITROGLYCERIN 0.4 MG SL SUBL: 0.4000 mg | SUBLINGUAL_TABLET | SUBLINGUAL | Status: DC | PRN

## 2012-01-31 MED ORDER — BUPIVACAINE LIPOSOME 1.3 % IJ SUSP
20.0000 mL | INTRAMUSCULAR | Status: AC
  Administered 2012-01-31: 20 mL
  Filled 2012-01-31: qty 20

## 2012-01-31 MED ORDER — SODIUM CHLORIDE 0.9 % IJ SOLN: 3.0000 mL | Freq: Two times a day (BID) | INTRAMUSCULAR | Status: DC

## 2012-01-31 MED ORDER — HYDROCODONE-ACETAMINOPHEN 5-325 MG PO TABS: 1.0000 | ORAL_TABLET | ORAL | Status: DC | PRN

## 2012-01-31 MED ORDER — 0.9 % SODIUM CHLORIDE (POUR BTL) OPTIME
TOPICAL | Status: DC | PRN
  Administered 2012-01-31: 1000 mL

## 2012-01-31 MED ORDER — PHENYLEPHRINE HCL 10 MG/ML IJ SOLN
INTRAMUSCULAR | Status: DC | PRN
  Administered 2012-01-31: 80 ug via INTRAVENOUS

## 2012-01-31 MED ORDER — ALBUTEROL SULFATE HFA 108 (90 BASE) MCG/ACT IN AERS: 2.0000 | INHALATION_SPRAY | RESPIRATORY_TRACT | Status: DC | PRN

## 2012-01-31 MED ORDER — DIAZEPAM 5 MG PO TABS: 5.0000 mg | ORAL_TABLET | Freq: Four times a day (QID) | ORAL | Status: AC | PRN

## 2012-01-31 MED ORDER — PROPOFOL 10 MG/ML IV EMUL
INTRAVENOUS | Status: DC | PRN
  Administered 2012-01-31: 40 mg via INTRAVENOUS
  Administered 2012-01-31: 120 mg via INTRAVENOUS

## 2012-01-31 MED ORDER — AMLODIPINE BESYLATE 2.5 MG PO TABS
2.5000 mg | ORAL_TABLET | Freq: Every day | ORAL | Status: DC
  Filled 2012-01-31: qty 1

## 2012-01-31 MED ORDER — ACETAMINOPHEN 325 MG PO TABS: 650.0000 mg | ORAL_TABLET | ORAL | Status: DC | PRN

## 2012-01-31 MED ORDER — ALUM & MAG HYDROXIDE-SIMETH 200-200-20 MG/5ML PO SUSP: 30.0000 mL | Freq: Four times a day (QID) | ORAL | Status: DC | PRN

## 2012-01-31 MED ORDER — DROPERIDOL 2.5 MG/ML IJ SOLN: 0.6250 mg | INTRAMUSCULAR | Status: DC | PRN

## 2012-01-31 MED ORDER — EPHEDRINE SULFATE 50 MG/ML IJ SOLN
INTRAMUSCULAR | Status: DC | PRN
  Administered 2012-01-31: 10 mg via INTRAVENOUS

## 2012-01-31 MED ORDER — MUPIROCIN 2 % EX OINT
TOPICAL_OINTMENT | CUTANEOUS | Status: AC
  Administered 2012-01-31: 1 via NASAL
  Filled 2012-01-31: qty 22

## 2012-01-31 MED ORDER — LACTATED RINGERS IV SOLN
INTRAVENOUS | Status: DC | PRN
  Administered 2012-01-31 (×2): via INTRAVENOUS

## 2012-01-31 MED ORDER — MORPHINE SULFATE 2 MG/ML IJ SOLN: 1.0000 mg | INTRAMUSCULAR | Status: DC | PRN

## 2012-01-31 MED ORDER — ONDANSETRON HCL 4 MG/2ML IJ SOLN
INTRAMUSCULAR | Status: DC | PRN
  Administered 2012-01-31: 4 mg via INTRAVENOUS

## 2012-01-31 MED ORDER — METFORMIN HCL 500 MG PO TABS
500.0000 mg | ORAL_TABLET | Freq: Two times a day (BID) | ORAL | Status: DC
  Filled 2012-01-31 (×3): qty 1

## 2012-01-31 MED ORDER — GLYCOPYRROLATE 0.2 MG/ML IJ SOLN
INTRAMUSCULAR | Status: DC | PRN
  Administered 2012-01-31: .8 mg via INTRAVENOUS

## 2012-01-31 MED ORDER — NEOSTIGMINE METHYLSULFATE 1 MG/ML IJ SOLN
INTRAMUSCULAR | Status: DC | PRN
  Administered 2012-01-31: 5 mg via INTRAVENOUS

## 2012-01-31 MED ORDER — SODIUM CHLORIDE 0.9 % IJ SOLN: 3.0000 mL | INTRAMUSCULAR | Status: DC | PRN

## 2012-01-31 MED ORDER — PHENOL 1.4 % MT LIQD: 1.0000 | OROMUCOSAL | Status: DC | PRN

## 2012-01-31 MED ORDER — ATORVASTATIN CALCIUM 40 MG PO TABS
40.0000 mg | ORAL_TABLET | Freq: Every day | ORAL | Status: DC
  Filled 2012-01-31: qty 1

## 2012-01-31 MED ORDER — ROCURONIUM BROMIDE 100 MG/10ML IV SOLN
INTRAVENOUS | Status: DC | PRN
  Administered 2012-01-31: 50 mg via INTRAVENOUS

## 2012-01-31 MED ORDER — LIDOCAINE HCL (CARDIAC) 20 MG/ML IV SOLN
INTRAVENOUS | Status: DC | PRN
  Administered 2012-01-31: 100 mg via INTRAVENOUS

## 2012-01-31 MED ORDER — POTASSIUM CHLORIDE IN NACL 20-0.9 MEQ/L-% IV SOLN
INTRAVENOUS | Status: DC
  Administered 2012-02-01: via INTRAVENOUS
  Filled 2012-01-31 (×3): qty 1000

## 2012-01-31 MED ORDER — SODIUM CHLORIDE 0.9 % IV SOLN: 250.0000 mL | INTRAVENOUS | Status: DC

## 2012-01-31 MED ORDER — PANTOPRAZOLE SODIUM 40 MG PO TBEC: 40.0000 mg | DELAYED_RELEASE_TABLET | Freq: Every day | ORAL | Status: DC

## 2012-01-31 MED ORDER — LIDOCAINE-EPINEPHRINE 0.5-1:200000 % IJ SOLN
INTRAMUSCULAR | Status: DC | PRN
  Administered 2012-01-31: 10 mL

## 2012-01-31 MED ORDER — PANTOPRAZOLE SODIUM 40 MG PO TBEC
40.0000 mg | DELAYED_RELEASE_TABLET | Freq: Every day | ORAL | Status: DC
  Administered 2012-02-01: 40 mg via ORAL
  Filled 2012-01-31: qty 1

## 2012-01-31 SURGICAL SUPPLY — 52 items
BAG DECANTER FOR FLEXI CONT (MISCELLANEOUS) ×2 IMPLANT
BENZOIN TINCTURE PRP APPL 2/3 (GAUZE/BANDAGES/DRESSINGS) IMPLANT
BLADE SURG ROTATE 9660 (MISCELLANEOUS) IMPLANT
BUR MATCHSTICK NEURO 3.0 LAGG (BURR) ×2 IMPLANT
CANISTER SUCTION 2500CC (MISCELLANEOUS) ×2 IMPLANT
CLOTH BEACON ORANGE TIMEOUT ST (SAFETY) ×2 IMPLANT
CONT SPEC 4OZ CLIKSEAL STRL BL (MISCELLANEOUS) ×2 IMPLANT
DECANTER SPIKE VIAL GLASS SM (MISCELLANEOUS) IMPLANT
DERMABOND ADVANCED (GAUZE/BANDAGES/DRESSINGS) ×1
DERMABOND ADVANCED .7 DNX12 (GAUZE/BANDAGES/DRESSINGS) ×1 IMPLANT
DRAPE LAPAROTOMY 100X72X124 (DRAPES) ×2 IMPLANT
DRAPE MICROSCOPE LEICA (MISCELLANEOUS) ×4 IMPLANT
DRAPE POUCH INSTRU U-SHP 10X18 (DRAPES) ×2 IMPLANT
DRAPE SURG 17X23 STRL (DRAPES) ×2 IMPLANT
DURAPREP 26ML APPLICATOR (WOUND CARE) ×2 IMPLANT
ELECT REM PT RETURN 9FT ADLT (ELECTROSURGICAL) ×2
ELECTRODE REM PT RTRN 9FT ADLT (ELECTROSURGICAL) ×1 IMPLANT
GAUZE SPONGE 4X4 16PLY XRAY LF (GAUZE/BANDAGES/DRESSINGS) IMPLANT
GLOVE BIOGEL M 8.0 STRL (GLOVE) ×2 IMPLANT
GLOVE ECLIPSE 6.5 STRL STRAW (GLOVE) ×2 IMPLANT
GLOVE EXAM NITRILE LRG STRL (GLOVE) IMPLANT
GLOVE EXAM NITRILE MD LF STRL (GLOVE) IMPLANT
GLOVE EXAM NITRILE XL STR (GLOVE) IMPLANT
GLOVE EXAM NITRILE XS STR PU (GLOVE) IMPLANT
GLOVE OPTIFIT SS 6.5 STRL BRWN (GLOVE) ×2 IMPLANT
GLOVE SURG SS PI 6.5 STRL IVOR (GLOVE) ×2 IMPLANT
GOWN BRE IMP SLV AUR LG STRL (GOWN DISPOSABLE) ×6 IMPLANT
GOWN BRE IMP SLV AUR XL STRL (GOWN DISPOSABLE) IMPLANT
GOWN STRL REIN 2XL LVL4 (GOWN DISPOSABLE) IMPLANT
KIT BASIN OR (CUSTOM PROCEDURE TRAY) ×2 IMPLANT
KIT ROOM TURNOVER OR (KITS) ×2 IMPLANT
NEEDLE HYPO 21X1.5 SAFETY (NEEDLE) ×2 IMPLANT
NEEDLE HYPO 25X1 1.5 SAFETY (NEEDLE) ×2 IMPLANT
NEEDLE SPNL 18GX3.5 QUINCKE PK (NEEDLE) IMPLANT
NS IRRIG 1000ML POUR BTL (IV SOLUTION) ×2 IMPLANT
PACK LAMINECTOMY NEURO (CUSTOM PROCEDURE TRAY) ×2 IMPLANT
PAD ARMBOARD 7.5X6 YLW CONV (MISCELLANEOUS) ×6 IMPLANT
RUBBERBAND STERILE (MISCELLANEOUS) ×8 IMPLANT
SPONGE GAUZE 4X4 12PLY (GAUZE/BANDAGES/DRESSINGS) IMPLANT
SPONGE LAP 4X18 X RAY DECT (DISPOSABLE) IMPLANT
SPONGE SURGIFOAM ABS GEL SZ50 (HEMOSTASIS) ×2 IMPLANT
STRIP CLOSURE SKIN 1/2X4 (GAUZE/BANDAGES/DRESSINGS) IMPLANT
SUT VIC AB 0 CT1 18XCR BRD8 (SUTURE) ×1 IMPLANT
SUT VIC AB 0 CT1 8-18 (SUTURE) ×1
SUT VIC AB 2-0 CT1 18 (SUTURE) ×2 IMPLANT
SUT VIC AB 3-0 SH 8-18 (SUTURE) ×2 IMPLANT
SYR 20CC LL (SYRINGE) ×2 IMPLANT
SYR 20ML ECCENTRIC (SYRINGE) ×2 IMPLANT
THROMBIN 5,000 UNITS ×4 IMPLANT
TOWEL OR 17X24 6PK STRL BLUE (TOWEL DISPOSABLE) ×2 IMPLANT
TOWEL OR 17X26 10 PK STRL BLUE (TOWEL DISPOSABLE) ×2 IMPLANT
WATER STERILE IRR 1000ML POUR (IV SOLUTION) ×2 IMPLANT

## 2012-01-31 NOTE — Anesthesia Procedure Notes (Signed)
Procedure Name: Intubation Date/Time: 01/31/2012 9:06 PM Performed by: Glendora Score A Pre-anesthesia Checklist: Patient identified, Emergency Drugs available, Suction available and Patient being monitored Patient Re-evaluated:Patient Re-evaluated prior to inductionOxygen Delivery Method: Circle system utilized Preoxygenation: Pre-oxygenation with 100% oxygen Intubation Type: IV induction Ventilation: Mask ventilation without difficulty Laryngoscope Size: Miller and 2 Grade View: Grade I Tube type: Oral Tube size: 7.5 mm Number of attempts: 1 Airway Equipment and Method: Stylet and LTA kit utilized Placement Confirmation: ETT inserted through vocal cords under direct vision,  positive ETCO2 and breath sounds checked- equal and bilateral Secured at: 23 cm Tube secured with: Tape Dental Injury: Teeth and Oropharynx as per pre-operative assessment

## 2012-01-31 NOTE — H&P (Signed)
BP 144/78  Pulse 59  Temp(Src) 98.2 F (36.8 C) (Oral)  Resp 18  SpO2 97% [name] is a 78 year old gentleman who presents today for evaluation of severe pain that he has in his back and left lower extremity.  This pain started approximately 6 weeks ago.  It runs from the hip all the way down to his left foot.  He does describe weakness in his left leg and foot.  He has had problems with dorsiflexion over that period of time.  He is currently retired and is right-handed.    PAST MEDICAL HISTORY:  Significant for COPD, diabetes Type II, hypercholesterolemia, tobacco abuse, constipation, peptic ulcer disease, bronchitis, Barrett's esophagus, thrombocytopenia.    DRUG ALLERGIES:    No known drug allergies that he listed.    FAMILY HISTORY:    He did not provide a family history.    PAST SURGICAL HISTORY:  No previous operations were noted on his chart.    SOCIAL HISTORY:    He has a pulse of 63.  He weighs 168 lbs. and is 5', 9 " tall.    REVIEW OF SYSTEMS:   Positive for back pain, left leg weakness, left foot weakness.  Denies constitutional, eye, ear, nose, throat, mouth, cardiovascular, respiratory, gastrointestinal, genitourinary, skin, neurological, psychiatric, endocrine, hematologic, allergic problems.    MEDICATIONS:    Medications are as follows:  Omeprazole, Simvastatin, Metoprolol, Isosorbide Mononitrate, Baby Aspirin, Amlodipine, Metformin.    EXAMINATION:    On examination he is alert, oriented x 4 and answering all questions appropriately.  Memory, language, attention span and fund of knowledge are normal.  He is well kempt and in obvious distress.  He is unable to stand without constantly shifting on and off his left lower extremity.  He does have some weakness in the left dorsiflexors 4/5; none on the right side.  Hip flexors quite weak on the left side 5-/5; normal on the right side.  Quadriceps slightly weak on the left than compared to the right.  Hamstrings  normal.  Muscle    tone, bulk and coordination are normal.  Romberg test is negative.  Gait is antalgic favoring the left lower extremity.  Pupils are equal, round and reactive to light.  Full extraocular movements.  Full visual fields.  Hearing intact to finger rub.  Symmetric facial sensation and movements.  Uvula elevates in the midline.  Shoulder shrug is normal.  Tongue protrudes in the midline.  No cervical masses or bruits.  Lungs clear.  Heart regular rhythm and rate.  No murmurs or rubs.  Pulses are good at the wrists bilaterally.  He is normocephalic, atraumatic.  Oral mucosa is normal.  Sclera not injected.  He has normal muscle tone and bulk.  Normal strength in the upper extremities.  Reflexes 2+ in the upper extremities, 2+ at the knees, 1+ at the ankles bilaterally.    DIAGNOSTIC STUDIES:   MRI lumbar spine is reviewed.  It shows a large free fragment of disc migrated caudally from the disc space at 4-5 central and to the left.  The rest of the spine looks fairly good.  He has degenerative changes certainly throughout, but the biggest problem here is this large free fragment of disc at 4-5.    SUMMARY:     I spoke at length with Mr. and Mrs. Troy [name].  I explained to [name] that the weakness he has in his left foot is what truly worries me.  It is the  reason for the pain.  It is the reason for the numbness that he has.  I neglected to mention, but his proprioception was intact in both lower and upper extremities.  Light touch was also intact.  Given the weakness, I think this is something that he should not delay treating.  I don't think physical therapy would help.  I do not believe that epidural injections would help.  I do believe that this fragment of disc can be removed safely and that surgical decompression offers the very best and quickest path to improvement.  Risks and benefits bleeding, infection, no relief, need for further surgery, recurrent disc herniation, damage to the  nerve roots, weakness in the lower extremities, bowel/bladder dysfunction were discussed.  Other risks were also discussed.  He understands and would like to proceed this coming Friday.

## 2012-01-31 NOTE — Anesthesia Preprocedure Evaluation (Signed)
Anesthesia Evaluation  Patient identified by MRN, date of birth, ID band Patient awake    Reviewed: Allergy & Precautions, H&P , NPO status , Patient's Chart, lab work & pertinent test results  History of Anesthesia Complications (+) PONV  Airway Mallampati: I TM Distance: >3 FB Neck ROM: Full    Dental  (+) Edentulous Upper, Partial Lower and Dental Advisory Given   Pulmonary COPD COPD inhaler,  breath sounds clear to auscultation  Pulmonary exam normal       Cardiovascular + CAD and + CABG Rhythm:Regular Rate:Normal     Neuro/Psych negative neurological ROS     GI/Hepatic Neg liver ROS, PUD, GERD-  Medicated and Controlled,  Endo/Other  Diabetes mellitus-, Oral Hypoglycemic Agents  Renal/GU negative Renal ROS     Musculoskeletal   Abdominal   Peds  Hematology   Anesthesia Other Findings   Reproductive/Obstetrics                           Anesthesia Physical Anesthesia Plan  ASA: III  Anesthesia Plan: General   Post-op Pain Management:    Induction: Intravenous  Airway Management Planned: Oral ETT  Additional Equipment:   Intra-op Plan:   Post-operative Plan: Extubation in OR  Informed Consent: I have reviewed the patients History and Physical, chart, labs and discussed the procedure including the risks, benefits and alternatives for the proposed anesthesia with the patient or authorized representative who has indicated his/her understanding and acceptance.   Dental advisory given  Plan Discussed with: CRNA, Anesthesiologist and Surgeon  Anesthesia Plan Comments:         Anesthesia Quick Evaluation

## 2012-01-31 NOTE — Op Note (Signed)
01/31/2012  10:17 PM  PATIENT:  Troy [name]  78 y.o. male presented with weakness And pAin the left lower extremity. MRI showed a large free fragment which had migrated caudally behind the L5 vertebral body  PRE-OPERATIVE DIAGNOSIS:  lumbar herniated disc lumbar radiculopathy LL4/5  POST-OPERATIVE DIAGNOSIS:  lumbar herniated disc lumbar radiculopathy Left L4/5  PROCEDURE:  Procedure(s): LUMBAR LAMINECTOMY/DECOMPRESSION MICRODISCECTOMY 1 LEVEL Left L45  SURGEON:  Surgeon(s): Carmela Hurt, MD  ASSISTANTS:ernesto botero  ANESTHESIA:   general  EBL:  Total I/O In: 1000 [I.V.:1000] Out: -   BLOOD ADMINISTERED:none  CELL SAVER GIVEN:no  COUNT:per nursing  DRAINS: none   SPECIMEN:  No Specimen  DICTATION: Mr. Going was taken to the operating room intubated and placed under a general anesthetic. He was rolled prone onto a Wilson frame with all pressure points properly padded. His back was prepped and draped in sterile fashion. I infiltrated 1/2 lidocaine into the lumbar region in the midline at L4/5. I opened the incision with a 10 blade and took the incision down to the thoracolumbar fascia. I exposed the lamina of L4 and L5 on the left side. I confirmed my location with intraoperative xray. I used the drill and Kerrison punches to perform a semihemilaminectomy of L4 and L5. I removed the ligamentum flavum exposing the thecal sac. I brought the microscope into the surgical field to aid in microdissection. I retracted the thecal sac medially to expose the disc space. I then used right angle dissectors, to free the fragment. I was able to tease enough of the disc to use the pituitary rongeur. I removed a very large fragment of disc. I probed caudally to make sure no more disc was present. I did not appreciate any more under the microscope.  I then irrigated the wound. Dr. Jeral Fruit and I then closed the wound in layers. I used vicryl sutures to approximate the thoracolumbar,  subcutaneous,and subcuticular layers. I used dermabond for a sterile dressing.   PLAN OF CARE: Admit for overnight observation  PATIENT DISPOSITION:  PACU - hemodynamically stable.   Delay start of Pharmacological VTE agent (>24hrs) due to surgical blood loss or risk of bleeding:  yes

## 2012-01-31 NOTE — Discharge Summary (Signed)
Physician Discharge Summary  Patient ID: Troy [name] MRN: [id number] DOB/AGE: 1933-02-17 78 y.o.  Admit date: 01/31/2012 Discharge date: 01/31/2012  Admission Diagnoses: Lumbar HNP L4/5 left  Discharge Diagnoses: Lumbar HNP l4/5 Left Active Problems:  * No active hospital problems. *    Discharged Condition: good  Hospital Course: Troy [name] was admitted and taken to the operating room where he underwent an uncomplicated Left L4/5 free disc fragment removal. I did not enter the disc space. At discharge his wound is clean dry and without signs of infection. He has voided and tolerated a regular diet. Neurologic exam shows weakness in the left hip flexors and quadriceps. Weakness present on preop exam.   Consults: None  Significant Diagnostic Studies: none  Treatments: surgery: Left L4/5 semihemilaminectomy and discetomy with microdissection.  Discharge Exam: Blood pressure 159/86, pulse 68, temperature 98 F (36.7 C), temperature source Oral, resp. rate 22, SpO2 96.00%. General appearance: alert, cooperative and appears stated age Neurologic: Mental status: Alert, oriented, thought content appropriate Motor: Weakness in hip flexors and quadriceps on the left.  Disposition: 01-Home or Self Care  Discharge Orders    Future Appointments: Provider: Department: Dept Phone: Center:   04/29/2012 8:30 AM Eustaquio Boyden, MD [facility] [phone] [id number]     Medication List  As of 01/31/2012 11:39 PM   STOP taking these medications         acetaminophen 500 MG tablet         TAKE these medications         albuterol 108 (90 BASE) MCG/ACT inhaler   Commonly known as: PROVENTIL HFA;VENTOLIN HFA   Inhale 2 puffs into the lungs every 4 (four) hours as needed. For shortness or breath, wheezing      amLODipine 5 MG tablet   Commonly known as: NORVASC   Take 2.5 mg by mouth daily.      aspirin 81 MG tablet   Take 81 mg by mouth daily.      diazepam 5 MG tablet     Commonly known as: VALIUM   Take 1 tablet (5 mg total) by mouth every 6 (six) hours as needed (muscle spasms).      isosorbide mononitrate 30 MG 24 hr tablet   Commonly known as: IMDUR   Take 15 mg by mouth daily.      metFORMIN 500 MG tablet   Commonly known as: GLUCOPHAGE   Take 500 mg by mouth 2 (two) times daily with a meal.      metoprolol 50 MG tablet   Commonly known as: LOPRESSOR   Take 25 mg by mouth 2 (two) times daily.      mupirocin ointment 2 %   Commonly known as: BACTROBAN   Apply topically 2 (two) times daily.      nitroGLYCERIN 0.4 MG SL tablet   Commonly known as: NITROSTAT   Place 0.4 mg under the tongue every 5 (five) minutes as needed. For chest pain-may repeat x3      omeprazole 40 MG capsule   Commonly known as: PRILOSEC   Take 40 mg by mouth daily.      simvastatin 80 MG tablet   Commonly known as: ZOCOR   Take 40 mg by mouth at bedtime.           Follow-up Information    Follow up with Nesta Kimple L, MD in 4 weeks. (Call to make appt.)    Contact information:   [address] [employer]  [place] [address] [phone]          Signed: Nathifa Ritthaler L 01/31/2012, 11:39 PM

## 2012-01-31 NOTE — Anesthesia Postprocedure Evaluation (Signed)
Anesthesia Post Note  Patient: Troy [name]  Procedure(s) Performed: Procedure(s) (LRB): LUMBAR LAMINECTOMY/DECOMPRESSION MICRODISCECTOMY 1 LEVEL (Left)  Anesthesia type: general  Patient location: PACU  Post pain: Pain level controlled  Post assessment: Patient's Cardiovascular Status Stable  Last Vitals:  Filed Vitals:   01/31/12 2235  BP: 155/78  Pulse: 79  Temp:   Resp: 18    Post vital signs: Reviewed and stable  Level of consciousness: sedated  Complications: No apparent anesthesia complications

## 2012-01-31 NOTE — Transfer of Care (Signed)
Immediate Anesthesia Transfer of Care Note  Patient: Troy [name]  Procedure(s) Performed: Procedure(s) (LRB): LUMBAR LAMINECTOMY/DECOMPRESSION MICRODISCECTOMY 1 LEVEL (Left)  Patient Location: PACU  Anesthesia Type: General  Level of Consciousness: awake, alert , oriented and patient cooperative  Airway & Oxygen Therapy: Patient Spontanous Breathing and Patient connected to face mask oxygen  Post-op Assessment: Report given to PACU RN  Post vital signs: Reviewed and stable  Complications: No apparent anesthesia complications

## 2012-01-31 NOTE — Preoperative (Signed)
Beta Blockers   Reason not to administer Beta Blockers:Not Applicable 

## 2012-01-31 NOTE — Discharge Instructions (Signed)
Lumbar Discectomy °Care After °A discectomy involves removal of discmaterial (the cartilage-like structures located between the bones of the back). It is done to relieve pressure on nerve roots. It can be used as a treatment for a back problem. The time in surgery depends on the findings in surgery and what is necessary to correct the problems. °HOME CARE INSTRUCTIONS  °· Check the cut (incision) made by the surgeon twice a day for signs of infection. Some signs of infection may include:  °· A foul smelling, greenish or yellowish discharge from the wound.  °· Increased pain.  °· Increased redness over the incision (operative) site.  °· The skin edges may separate.  °· Flu-like symptoms (problems).  °· A temperature above 101.5° F (38.6° C).  °· Change your bandages in about 24 to 36 hours following surgery or as directed.  °· You may shower tomrrow.  Avoid bathtubs, swimming pools and hot tubs for three weeks or until your incision has healed completely. °· Follow your doctor's instructions as to safe activities, exercises, and physical therapy.  °· Weight reduction may be beneficial if you are overweight.  °· Daily exercise is helpful to prevent the return of problems. Walking is permitted. You may use a treadmill without an incline. Cut down on activities and exercise if you have discomfort. You may also go up and down stairs as much as you can tolerate.  °· DO NOT lift anything heavier than 10 to 15 lbs. Avoid bending or twisting at the waist. Always bend your knees when lifting.  °· Maintain strength and range of motion as instructed.  °· Do not drive for 10 days, or as directed by your doctors. You may be a passenger . Lying back in the passenger seat may be more comfortable for you. Always wear a seatbelt.  °· Limit your sitting in a regular chair to 20 to 30 minutes at a time. There are no limitations for sitting in a recliner. You should lie down or walk in between sitting periods.  °· Only take  over-the-counter or prescription medicines for pain, discomfort, or fever as directed by your caregiver.  °SEEK MEDICAL CARE IF:  °· There is increased bleeding (more than a small spot) from the wound.  °· You notice redness, swelling, or increasing pain in the wound.  °· Pus is coming from wound.  °· You develop an unexplained oral temperature above 102° F (38.9° C) develops.  °· You notice a foul smell coming from the wound or dressing.  °· You have increasing pain in your wound.  °SEEK IMMEDIATE MEDICAL CARE IF:  °· You develop a rash.  °· You have difficulty breathing.  °· You develop any allergic problems to medicines given.  °Document Released: 08/07/2004 Document Revised: 08/22/2011 Document Reviewed: 11/26/2007 °ExitCare® Patient Information °

## 2012-02-01 NOTE — Evaluation (Signed)
Physical Therapy Evaluation Patient Details Name: Troy [name] MRN: [id number] DOB: 1933/05/29 Today's Date: 02/01/2012 Time: 1478-2956 PT Time Calculation (min): 14 min  PT Assessment / Plan / Recommendation Clinical Impression  Pt is 78 y/o male admitted for s/p L4-L5 disc fragment removal.  Pt moving well and has no futher PT needs.  PT will sign off.    PT Assessment  Patent does not need any further PT services    Follow Up Recommendations  No PT follow up    Barriers to Discharge        lEquipment Recommendations  None recommended by PT    Recommendations for Other Services     Frequency      Precautions / Restrictions Precautions Precautions: Back Precaution Comments: Educated pt on 3/3 back precautions. Restrictions Weight Bearing Restrictions: No   Pertinent Vitals/Pain No c/o pain      Mobility  Bed Mobility Bed Mobility: Rolling Right;Right Sidelying to Sit;Sitting - Scoot to Edge of Bed Rolling Right: 6: Modified independent (Device/Increase time) Right Sidelying to Sit: 6: Modified independent (Device/Increase time) Sitting - Scoot to Edge of Bed: 6: Modified independent (Device/Increase time) Details for Bed Mobility Assistance: Required increased time.  Independently able maintain back precautions. Transfers Transfers: Sit to Stand;Stand to Sit Sit to Stand: 6: Modified independent (Device/Increase time);From bed;With upper extremity assist Stand to Sit: 6: Modified independent (Device/Increase time);To bed;With upper extremity assist Ambulation/Gait Ambulation/Gait Assistance: 6: Modified independent (Device/Increase time) Ambulation Distance (Feet): 200 Feet Assistive device: None Ambulation/Gait Assistance Details: Pt moving well however wide BOS and antaglic gait due to sx Gait Pattern: Wide base of support    Exercises     PT Diagnosis:    PT Problem List:   PT Treatment Interventions:     PT Goals    Visit Information  Last PT  Received On: 02/01/12 Assistance Needed: +1 PT/OT Co-Evaluation/Treatment: Yes    Subjective Data  Subjective: "I'm ready to leave today." Patient Stated Goal: To go home.   Prior Functioning  Home Living Lives With: Spouse Available Help at Discharge: Family Type of Home: House Home Access: Stairs to enter [employer] of Steps: 1 Entrance Stairs-Rails: None Home Layout: One level Bathroom Shower/Tub: Tub/shower unit;Walk-in shower;Curtain Bathroom Toilet: Standard Home Adaptive Equipment: Built-in shower seat;Hand-held shower hose;Reacher Prior Function Level of Independence: Independent Able to Take Stairs?: Yes Driving: Yes Communication Communication: No difficulties    Cognition  Overall Cognitive Status: Appears within functional limits for tasks assessed/performed Arousal/Alertness: Awake/alert Orientation Level: Appears intact for tasks assessed Behavior During Session: [facility] for tasks performed    Extremity/Trunk Assessment Right Upper Extremity Assessment RUE ROM/Strength/Tone: Within functional levels Left Upper Extremity Assessment LUE ROM/Strength/Tone: Within functional levels Right Lower Extremity Assessment RLE ROM/Strength/Tone: Within functional levels Left Lower Extremity Assessment LLE ROM/Strength/Tone: Within functional levels LLE Sensation: Deficits LLE Sensation Deficits: c/o left great toe numbness   Balance Balance Balance Assessed: Yes Static Sitting Balance Static Sitting - Balance Support: Feet supported Static Sitting - Level of Assistance: 6: Modified independent (Device/Increase time)  End of Session PT - End of Session Equipment Utilized During Treatment: Gait belt Activity Tolerance: Patient tolerated treatment well Patient left: in bed;with call bell/phone within reach;with family/visitor present Nurse Communication: Mobility status   Troy [name] 02/01/2012, 8:18 AM Jake Shark, PT DPT [phone]

## 2012-02-01 NOTE — Progress Notes (Signed)
Occupational Therapy Evaluation Patient Details Name: Troy [name] MRN: [id number] DOB: 1933-05-12 Today's Date: 02/01/2012 Time: 9562-1308 OT Time Calculation (min): 14 min  OT Assessment / Plan / Recommendation Clinical Impression  Pt s/p left L4-5 disc fragment removal.  Pt able to demonstrate ADLs and functional mobility at mod I level.  Pt will have necessary level of assist at home.  No DME needs. All education completed. No further OT needs.    OT Assessment  Patient does not need any further OT services    Follow Up Recommendations  No OT follow up    Barriers to Discharge      Equipment Recommendations  None recommended by OT    Recommendations for Other Services    Frequency       Precautions / Restrictions Precautions Precautions: Back Precaution Comments: Educated pt on 3/3 back precautions. Restrictions Weight Bearing Restrictions: No   Pertinent Vitals/Pain N/A    ADL  Grooming: Performed;Wash/dry hands;Modified independent Where Assessed - Grooming: Unsupported standing Lower Body Bathing: Simulated;Modified independent Where Assessed - Lower Body Bathing: Unsupported sit to stand Lower Body Dressing: Modified independent;Performed Where Assessed - Lower Body Dressing: Unsupported sit to stand Toilet Transfer: Simulated;Modified independent Toilet Transfer Method:  (ambulating) Toilet Transfer Equipment: Regular height toilet Tub/Shower Transfer: Simulated;Modified independent Tub/Shower Transfer Method: Ambulating Equipment Used: Gait belt Transfers/Ambulation Related to ADLs: Pt ambulated throughout the room and unit with mod I for increased time. ADL Comments: Educated pt on correct technique for LB bathing/dressing to maintain back precautions.  Discussed ways to adhere to back precautions during ADL/IADLs with pt and spouse.    OT Diagnosis:    OT Problem List:   OT Treatment Interventions:     OT Goals    Visit Information  Last OT  Received On: 02/01/12 Assistance Needed: +1 PT/OT Co-Evaluation/Treatment: Yes    Subjective Data      Prior Functioning  Home Living Lives With: Spouse Available Help at Discharge: Family Type of Home: House Home Access: Stairs to enter [employer] of Steps: 1 Entrance Stairs-Rails: None Home Layout: One level Bathroom Shower/Tub: Tub/shower unit;Walk-in shower;Curtain Bathroom Toilet: Standard Home Adaptive Equipment: Built-in shower seat;Hand-held shower hose;Reacher Prior Function Level of Independence: Independent Able to Take Stairs?: Yes Driving: Yes Communication Communication: No difficulties    Cognition  Overall Cognitive Status: Appears within functional limits for tasks assessed/performed Arousal/Alertness: Awake/alert Orientation Level: Appears intact for tasks assessed Behavior During Session: [facility] for tasks performed    Extremity/Trunk Assessment Right Upper Extremity Assessment RUE ROM/Strength/Tone: Within functional levels Left Upper Extremity Assessment LUE ROM/Strength/Tone: Within functional levels Right Lower Extremity Assessment RLE ROM/Strength/Tone: Within functional levels Left Lower Extremity Assessment LLE ROM/Strength/Tone: Within functional levels LLE Sensation: Deficits LLE Sensation Deficits: c/o left great toe numbness   Mobility Bed Mobility Bed Mobility: Rolling Right;Right Sidelying to Sit;Sitting - Scoot to Edge of Bed Rolling Right: 6: Modified independent (Device/Increase time) Right Sidelying to Sit: 6: Modified independent (Device/Increase time) Sitting - Scoot to Edge of Bed: 6: Modified independent (Device/Increase time) Details for Bed Mobility Assistance: Required increased time.  Independently able maintain back precautions. Transfers Transfers: Sit to Stand;Stand to Sit Sit to Stand: 6: Modified independent (Device/Increase time);From bed;With upper extremity assist Stand to Sit: 6: Modified independent  (Device/Increase time);To bed;With upper extremity assist   Exercise    Balance Balance Balance Assessed: Yes Static Sitting Balance Static Sitting - Balance Support: Feet supported Static Sitting - Level of Assistance: 6: Modified independent (Device/Increase time)  End of Session OT - End of Session Equipment Utilized During Treatment: Gait belt Activity Tolerance: Patient tolerated treatment well Patient left: in bed;with call bell/phone within reach;with family/visitor present Nurse Communication: Mobility status  02/01/2012 Cipriano Mile OTR/L Pager [phone] Office [phone]  Cipriano Mile 02/01/2012, 8:18 AM

## 2012-02-03 LAB — GLUCOSE, CAPILLARY
Glucose-Capillary: 91 mg/dL (ref 70–99)
Glucose-Capillary: 115 mg/dL — ABNORMAL HIGH (ref 70–99)

## 2012-02-04 ENCOUNTER — Encounter (HOSPITAL_COMMUNITY): Payer: Self-pay | Admitting: Neurosurgery

## 2012-02-05 MED FILL — Thrombin For Soln Kit 5000 Unit: CUTANEOUS | Qty: 1 | Status: AC

## 2012-02-24 ENCOUNTER — Encounter: Payer: Self-pay | Admitting: Family Medicine

## 2012-02-24 ENCOUNTER — Ambulatory Visit (INDEPENDENT_AMBULATORY_CARE_PROVIDER_SITE_OTHER): Payer: Medicare Other | Admitting: Family Medicine

## 2012-02-24 VITALS — BP 132/52 | HR 56 | Temp 97.7°F | Wt 169.0 lb

## 2012-02-24 DIAGNOSIS — K5900 Constipation, unspecified: Secondary | ICD-10-CM

## 2012-02-24 NOTE — Progress Notes (Signed)
78 yo pt of Dr Reece Agar new to me here, here for constipation.  Of note, s/p Lumbar laminectomy/decomression on 5/17 (Dr. Mikal Plane).  No bowel or bladder incontinence and has recovered well.  Not taking any narcotics.  H/o chronic constipation, going on 2-3 weeks. Feels like needs to go but unable to. Passing gas fine. When does have BM, hard initially.   Usually uses magnesium citrate but has not tried anything for this bout of constipation.   Does have large bowel movements when uses this. Not otherwise. Tried Miralax- "doesn't work at all."  Normally goes every 2-3 days. Eating normally.   No abd pain, fevers/chills. Not drinking much water, low fiber diet.  Colonocsopy 2010 - adenomatous and hyperplastic polyp. No mention of diverticulosis.   Review of Systems  Per HPI   No nausea, no vomiting. No blood in stool.  Patient Active Problem List  Diagnoses  . DIABETES MELLITUS, TYPE II  . HYPERCHOLESTEROLEMIA  . TOBACCO ABUSE  . CONSTIPATION  . OTHER CHEST PAIN  . COPD, mild  . Medicare annual wellness visit, subsequent  . PUD (peptic ulcer disease)  . Skin rash  . Bronchitis, acute, with bronchospasm  . Thrombocytopenia  . Barrett's esophagus  . Lumbosacral radiculopathy at L5   Past Medical History  Diagnosis Date  . Coronary atherosclerosis of artery bypass graft   . HLD (hyperlipidemia)   . Tobacco use disorder   . Hypertrophy of prostate without urinary obstruction and other lower urinary tract symptoms (LUTS)   . Diabetes mellitus type II ~2008  . Gastrointestinal ulcer   . Prostate nodule ~2009    Left-benign s/p eval Uro WNL ([facility])  . Chronic obstructive pulmonary disease (COPD)     on CXR  . Barrett's esophagus     EGD 2012, rpt 2014, longterm PPI  . Thrombocytopenia   . HNP (herniated nucleus pulposus), lumbar 2013    with compression of L5 nerve root and foot drop, also with lumbar DDD  . PONV (postoperative nausea and vomiting)   . GERD  (gastroesophageal reflux disease)    Past Surgical History  Procedure Date  . Coronary artery bypass graft 1999    Dr. Cornelius Moras  . Hemorrhoid surgery   . Nose surgery   . Rotator cuff repair 04/2010    right (but has bilat) Dr. Yisroel Ramming ([facility])  . Colonoscopy 03/2009    small hemorrhoids, 2 polyps (hyperplastic and adenomatous), rpt 5 yrs (Dr. Maryjane Hurter)  . Esophagogastroduodenoscopy 05/16/2011    after hematemesis, no lesions found, consistent with barrett's esophagus rpt 2 yrs  . Lumbar laminectomy/decompression microdiscectomy 01/31/2012    Procedure: LUMBAR LAMINECTOMY/DECOMPRESSION MICRODISCECTOMY 1 LEVEL;  Surgeon: Carmela Hurt, MD;  Location: [facility];  Service: Neurosurgery;  Laterality: Left;  LEFT Lumbar Four-Five Diskectomy   History  Substance Use Topics  . Smoking status: Current Everyday Smoker -- 1.0 packs/day    Types: Cigarettes  . Smokeless tobacco: Not on file   Comment: < 1 ppd; does dip sometimes  . Alcohol Use: Yes     Seldom   Family History  Problem Relation Age of Onset  . Cancer Father 64    colon   Allergies  Allergen Reactions  . Codeine     REACTION: vomiting  . Cyclobenzaprine Hcl     REACTION: vomiting  . Hydrocodone Nausea Only and Other (See Comments)    "crazy"  . Valium Other (See Comments)    sedation   Current Outpatient Prescriptions  on File Prior to Visit  Medication Sig Dispense Refill  . albuterol (PROVENTIL HFA;VENTOLIN HFA) 108 (90 BASE) MCG/ACT inhaler Inhale 2 puffs into the lungs every 4 (four) hours as needed. For shortness or breath, wheezing      . amLODipine (NORVASC) 5 MG tablet Take 2.5 mg by mouth daily.      [name] aspirin 81 MG tablet Take 81 mg by mouth daily.       . isosorbide mononitrate (IMDUR) 30 MG 24 hr tablet Take 15 mg by mouth daily.       . metFORMIN (GLUCOPHAGE) 500 MG tablet Take 500 mg by mouth 2 (two) times daily with a meal.       . metoprolol (LOPRESSOR) 50 MG tablet Take 25 mg by mouth 2  (two) times daily.       . nitroGLYCERIN (NITROSTAT) 0.4 MG SL tablet Place 0.4 mg under the tongue every 5 (five) minutes as needed. For chest pain-may repeat x3      . omeprazole (PRILOSEC) 40 MG capsule Take 40 mg by mouth daily.      . simvastatin (ZOCOR) 80 MG tablet Take 40 mg by mouth at bedtime.        The PMH, PSH, Social History, Family History, Medications, and allergies have been reviewed in [facility] and have been updated if relevant.  Objective:   Physical Exam  BP 132/52  Pulse 56  Temp(Src) 97.7 F (36.5 C) (Oral)  Wt 169 lb (76.658 kg)  Constitutional: He appears well-developed and well-nourished. No distress.  Abdominal: Soft. Bowel sounds are normal. He exhibits no distension and no mass. There is no tenderness. There is no rebound and no guarding.  Genitourinary: Rectum normal and prostate normal. Rectal exam shows no external hemorrhoid, no internal hemorrhoid, no fissure, no mass, no tenderness and anal tone normal.  Psychiatric: He has a normal mood and affect.    Assessment and Plan: 1. CONSTIPATION    Deteriorated. Discussed constipation- causes and treatment options. See pt instructions for details. Advised daily colace, restarting miralax. Also can try dulcolax suppository as directed. The patient indicates understanding of these issues and agrees with the plan.

## 2012-02-24 NOTE — Patient Instructions (Signed)
Add colace as directed on bottle daily. Take miralax one capful daily. Try dulcolax suppository if no BM. If still not results, we can try an enema- call me.      Constipation in Adults Constipation is having fewer than 2 bowel movements per week. Usually, the stools are hard. As we grow older, constipation is more common. If you try to fix constipation with laxatives, the problem may get worse. This is because laxatives taken over a long period of time make the colon muscles weaker. A low-fiber diet, not taking in enough fluids, and taking some medicines may make these problems worse. MEDICATIONS THAT MAY CAUSE CONSTIPATION  Water pills (diuretics).   Calcium channel blockers (used to control blood pressure and for the heart).   Certain pain medicines (narcotics).   Anticholinergics.   Anti-inflammatory agents.   Antacids that contain aluminum.  DISEASES THAT CONTRIBUTE TO CONSTIPATION  Diabetes.   Parkinson's disease.   Dementia.   Stroke.   Depression.   Illnesses that cause problems with salt and water metabolism.  HOME CARE INSTRUCTIONS   Constipation is usually best cared for without medicines. Increasing dietary fiber and eating more fruits and vegetables is the best way to manage constipation.   Slowly increase fiber intake to 25 to 38 grams per day. Whole grains, fruits, vegetables, and legumes are good sources of fiber. A dietitian can further help you incorporate high-fiber foods into your diet.   Drink enough water and fluids to keep your urine clear or pale yellow.   A fiber supplement may be added to your diet if you cannot get enough fiber from foods.   Increasing your activities also helps improve regularity.   Suppositories, as suggested by your caregiver, will also help. If you are using antacids, such as aluminum or calcium containing products, it will be helpful to switch to products containing magnesium if your caregiver says it is okay.   If you  have been given a liquid injection (enema) today, this is only a temporary measure. It should not be relied on for treatment of longstanding (chronic) constipation.   Stronger measures, such as magnesium sulfate, should be avoided if possible. This may cause uncontrollable diarrhea. Using magnesium sulfate may not allow you time to make it to the bathroom.  SEEK IMMEDIATE MEDICAL CARE IF:   There is bright red blood in the stool.   The constipation stays for more than 4 days.   There is belly (abdominal) or rectal pain.   You do not seem to be getting better.   You have any questions or concerns.  MAKE SURE YOU:   Understand these instructions.   Will watch your condition.   Will get help right away if you are not doing well or get worse.  Document Released: 05/31/2004 Document Revised: 05/15/2011 Document Reviewed: 04/20/2008 [facility] Patient Information 2012 [place], [place].

## 2012-03-04 ENCOUNTER — Encounter: Payer: Self-pay | Admitting: Family Medicine

## 2012-04-28 ENCOUNTER — Telehealth: Payer: Self-pay | Admitting: Family Medicine

## 2012-04-28 NOTE — Telephone Encounter (Signed)
Patient's wife called with some concerns about patient. He has appt tomorrow and she wanted you to be aware of things because she wasn't coming back with him for his exam. She said that over the last year he seems to be forgetting things and getting very angry with her when she points it out. She said he isn't getting lost when driving, but has had instances where he has forgotten where to turn to get into shopping centers, etc. She handles finances, so no problems there, but she is concerned about his outbursts of anger over little things he forgets. They had been out of town and came back and she was going to fix sandwiches for supper so she wouldn't have to cook after being gone. He got mad at her and said that they had only had one meal while they had been gone (for 10 days) and he wasn't going to eat "just a sandwich". She reminded him of all the meals they had eaten and then he remembered. She is just concerned and wanted you to be aware so that if there was anything you could do at his visit to investigate things further.

## 2012-04-28 NOTE — Telephone Encounter (Signed)
Noted. Thanks. Will investigate at medicare wellness visit.

## 2012-04-29 ENCOUNTER — Encounter: Payer: Self-pay | Admitting: Family Medicine

## 2012-04-29 ENCOUNTER — Ambulatory Visit (INDEPENDENT_AMBULATORY_CARE_PROVIDER_SITE_OTHER): Payer: Medicare Other | Admitting: Family Medicine

## 2012-04-29 VITALS — BP 130/70 | HR 72 | Temp 97.7°F | Ht 70.75 in | Wt 166.8 lb

## 2012-04-29 DIAGNOSIS — K279 Peptic ulcer, site unspecified, unspecified as acute or chronic, without hemorrhage or perforation: Secondary | ICD-10-CM

## 2012-04-29 DIAGNOSIS — M5417 Radiculopathy, lumbosacral region: Secondary | ICD-10-CM

## 2012-04-29 DIAGNOSIS — Z0000 Encounter for general adult medical examination without abnormal findings: Secondary | ICD-10-CM

## 2012-04-29 DIAGNOSIS — K5900 Constipation, unspecified: Secondary | ICD-10-CM

## 2012-04-29 DIAGNOSIS — F17200 Nicotine dependence, unspecified, uncomplicated: Secondary | ICD-10-CM

## 2012-04-29 DIAGNOSIS — K2270 Barrett's esophagus without dysplasia: Secondary | ICD-10-CM

## 2012-04-29 DIAGNOSIS — E119 Type 2 diabetes mellitus without complications: Secondary | ICD-10-CM

## 2012-04-29 DIAGNOSIS — IMO0002 Reserved for concepts with insufficient information to code with codable children: Secondary | ICD-10-CM

## 2012-04-29 DIAGNOSIS — E7800 Pure hypercholesterolemia, unspecified: Secondary | ICD-10-CM

## 2012-04-29 LAB — MICROALBUMIN / CREATININE URINE RATIO: Microalb Creat Ratio: 0.6 mg/g (ref 0.0–30.0)

## 2012-04-29 MED ORDER — OMEPRAZOLE 40 MG PO CPDR: 40.0000 mg | DELAYED_RELEASE_CAPSULE | Freq: Every day | ORAL | Status: DC

## 2012-04-29 MED ORDER — SIMVASTATIN 40 MG PO TABS: 40.0000 mg | ORAL_TABLET | Freq: Every day | ORAL | Status: DC

## 2012-04-29 MED ORDER — METOPROLOL TARTRATE 25 MG PO TABS: 25.0000 mg | ORAL_TABLET | Freq: Two times a day (BID) | ORAL | Status: DC

## 2012-04-29 MED ORDER — ISOSORBIDE MONONITRATE ER 30 MG PO TB24: 15.0000 mg | ORAL_TABLET | Freq: Every day | ORAL | Status: DC

## 2012-04-29 MED ORDER — METFORMIN HCL 500 MG PO TABS: 500.0000 mg | ORAL_TABLET | Freq: Two times a day (BID) | ORAL | Status: DC

## 2012-04-29 NOTE — Assessment & Plan Note (Signed)
Continue to encourage smoking cessation.  Set in his ways.

## 2012-04-29 NOTE — Assessment & Plan Note (Signed)
Chronic, stable on metformin. Due for this - check A1c today. Recommended schedule vision exam.

## 2012-04-29 NOTE — Assessment & Plan Note (Signed)
S/p laminectomy/decompression 01/2012.  Stable.

## 2012-04-29 NOTE — Assessment & Plan Note (Signed)
Stable on miralax. 

## 2012-04-29 NOTE — Assessment & Plan Note (Signed)
H/o this with hematemesis 04/2011.  Stable currently.  On PPI.

## 2012-04-29 NOTE — Patient Instructions (Signed)
Call your insurace about the shingles shot to see if it is covered or how much it would cost and where is cheaper (here or pharmacy).  If you want to receive here, call for nurse visit.  Check into if you've received pneumonia shot. Blood work today to check A1c. For ears  - try equal parts water/hydrogen peroxide once a week or every 2 weeks.  If worsening hearing, please let us know for audiology evaluation. Good to see you today, call us with quesitons. Return in 6 months for follow up diabetes.

## 2012-04-29 NOTE — Assessment & Plan Note (Signed)
Chronic, stable. Continue zocor at 40mg  daily.

## 2012-04-29 NOTE — Assessment & Plan Note (Signed)
Continues PPI.  

## 2012-04-29 NOTE — Progress Notes (Signed)
Subjective:    Patient ID: Troy [name], male    DOB: 06-05-1933, 78 y.o.   MRN: [id number]  HPI CC: medicare wellness visit  No questions/concerns today.  [facility] - [facility] today.  Wife called yesterday with concerns about memory/irritability and anger outbursts - see phone note.  Back doing well - had surgery 01/2012 for HNP with compression of L5 and foot drop.  Good experience. Really appreciates Dr. Mikal Plane.  Legs feeling well.  Smoking - about 1ppd.  Precontemplative.  No cough/SOB. Constipation - taking miralax daily which does work.  Takes 1 capful daily.  1 soft BM daily with this.  Hearing screen - failed today. Denies problems with hearing, sometimes feels ears plugged. Vision screen - passed.  No upcoming eye doctor appt.  rec see at least every 2 years, ideally yearly.  Denies falls in last year. Denies depression/sadness/anhedonia.  preventative:  flu shot gets yearly.  Tetanus 07/2010  Thinks had PNA shot done. Shingles shot - has not had. [facility] but doesn't think wants to return there as too expensive. Has had prostate checked by [facility] in past, not planning on returning.  Nocturia occasional, adequate stream.  Does not remember latest prostate check.  Discussed screening.  Would like to age out. Colonoscopy - had done last year 2011. Told didn't need rpt for at least 5 years. [facility].  Lives with wife; No pets He is very active He was in the [employer] for 4 years. He gets meds from the [place] Activity: walking daily since back surgery Diet: good water, not much fruits/vegetables  Lab Results  Component Value Date   PSA 0.80 04/23/2011    Medications and allergies reviewed and updated in chart.  Past histories reviewed and updated if relevant as below. Patient Active Problem List  Diagnosis  . DIABETES MELLITUS, TYPE II  . HYPERCHOLESTEROLEMIA  . TOBACCO ABUSE  . CONSTIPATION  . OTHER CHEST PAIN  . COPD, mild  . Medicare annual  wellness visit, subsequent  . PUD (peptic ulcer disease)  . Skin rash  . Bronchitis, acute, with bronchospasm  . Thrombocytopenia  . Barrett's esophagus  . Lumbosacral radiculopathy at L5   Past Medical History  Diagnosis Date  . Coronary atherosclerosis of artery bypass graft   . HLD (hyperlipidemia)   . Tobacco use disorder   . Hypertrophy of prostate without urinary obstruction and other lower urinary tract symptoms (LUTS)   . Diabetes mellitus type II ~2008  . Gastrointestinal ulcer   . Prostate nodule ~2009    Left-benign s/p eval Uro WNL ([facility])  . Chronic obstructive pulmonary disease (COPD)     on CXR  . Barrett's esophagus     EGD 2012, rpt 2014, longterm PPI  . Thrombocytopenia   . HNP (herniated nucleus pulposus), lumbar 2013    with compression of L5 nerve root and foot drop, also with lumbar DDD s/p surgery  . PONV (postoperative nausea and vomiting)   . GERD (gastroesophageal reflux disease)    Past Surgical History  Procedure Date  . Coronary artery bypass graft 1999    Dr. Cornelius Moras  . Hemorrhoid surgery   . Nose surgery   . Rotator cuff repair 04/2010    right (but has bilat) Dr. Yisroel Ramming ([facility])  . Colonoscopy 03/2009    small hemorrhoids, 2 polyps (hyperplastic and adenomatous), rpt 5 yrs (Dr. Maryjane Hurter)  . Esophagogastroduodenoscopy 05/16/2011    after hematemesis, no lesions found, consistent  with barrett's esophagus rpt 2 yrs  . Lumbar laminectomy/decompression microdiscectomy 01/31/2012    Procedure: LUMBAR LAMINECTOMY/DECOMPRESSION MICRODISCECTOMY 1 LEVEL;  Surgeon: Carmela Hurt, MD;  Location: [facility];  Service: Neurosurgery;  Laterality: Left;  LEFT Lumbar Four-Five Diskectomy   History  Substance Use Topics  . Smoking status: Current Everyday Smoker -- 1.0 packs/day    Types: Cigarettes  . Smokeless tobacco: Not on file   Comment: < 1 ppd; does dip sometimes  . Alcohol Use: Yes     Seldom   Family History  Problem Relation Age  of Onset  . Cancer Father 76    colon   Allergies  Allergen Reactions  . Codeine     REACTION: vomiting  . Cyclobenzaprine Hcl     REACTION: vomiting  . Hydrocodone Nausea Only and Other (See Comments)    "crazy"  . Valium Other (See Comments)    sedation   Current Outpatient Prescriptions on File Prior to Visit  Medication Sig Dispense Refill  . albuterol (PROVENTIL HFA;VENTOLIN HFA) 108 (90 BASE) MCG/ACT inhaler Inhale 2 puffs into the lungs every 4 (four) hours as needed. For shortness or breath, wheezing      . amLODipine (NORVASC) 5 MG tablet Take 2.5 mg by mouth daily.      [name] aspirin 81 MG tablet Take 81 mg by mouth daily.       . isosorbide mononitrate (IMDUR) 30 MG 24 hr tablet Take 15 mg by mouth daily.       . metFORMIN (GLUCOPHAGE) 500 MG tablet Take 500 mg by mouth 2 (two) times daily with a meal.       . metoprolol (LOPRESSOR) 50 MG tablet Take 25 mg by mouth 2 (two) times daily.       [name] omeprazole (PRILOSEC) 40 MG capsule Take 40 mg by mouth daily.      . simvastatin (ZOCOR) 80 MG tablet Take 40 mg by mouth at bedtime.       . nitroGLYCERIN (NITROSTAT) 0.4 MG SL tablet Place 0.4 mg under the tongue every 5 (five) minutes as needed. For chest pain-may repeat x3        Review of Systems  Constitutional: Negative for fever, chills, activity change, appetite change, fatigue and unexpected weight change.  HENT: Negative for hearing loss and neck pain.   Eyes: Negative for visual disturbance.  Respiratory: Negative for cough, chest tightness, shortness of breath and wheezing.   Cardiovascular: Negative for chest pain, palpitations and leg swelling.  Gastrointestinal: Negative for nausea, vomiting, abdominal pain, diarrhea, constipation, blood in stool and abdominal distention.  Genitourinary: Negative for hematuria and difficulty urinating.  Musculoskeletal: Negative for myalgias and arthralgias.  Skin: Negative for rash.  Neurological: Negative for dizziness, seizures,  syncope and headaches.  Hematological: Does not bruise/bleed easily.  Psychiatric/Behavioral: Negative for dysphoric mood. The patient is not nervous/anxious.        Objective:   Physical Exam  Nursing note and vitals reviewed. Constitutional: He is oriented to person, place, and time. He appears well-developed and well-nourished. No distress.  HENT:  Head: Normocephalic and atraumatic.  Right Ear: External ear normal.  Left Ear: External ear normal.  Nose: Nose normal.  Mouth/Throat: Oropharynx is clear and moist. No oropharyngeal exudate.       Convoluted canals, cerumen deep in canals, unable to visualize TMs  Eyes: Conjunctivae and EOM are normal. Pupils are equal, round, and reactive to light. No scleral icterus.  Neck: Normal range of  motion. Neck supple. Carotid bruit is not present.  Cardiovascular: Normal rate, regular rhythm, normal heart sounds and intact distal pulses.   No murmur heard. Pulses:      Radial pulses are 2+ on the right side, and 2+ on the left side.  Pulmonary/Chest: Effort normal and breath sounds normal. No respiratory distress. He has no wheezes. He has no rales.  Abdominal: Soft. Bowel sounds are normal. He exhibits no distension and no mass. There is no tenderness. There is no rebound and no guarding.  Genitourinary:       deferred  Musculoskeletal: Normal range of motion. He exhibits no edema.  Lymphadenopathy:    He has no cervical adenopathy.  Neurological: He is alert and oriented to person, place, and time.       CN grossly intact, station and gait intact  Skin: Skin is warm and dry. No rash noted.  Psychiatric: He has a normal mood and affect. His behavior is normal. Judgment and thought content normal.       Assessment & Plan:

## 2012-04-29 NOTE — Assessment & Plan Note (Addendum)
I have personally reviewed the Medicare Annual Wellness questionnaire and have noted 1. The patient's medical and social history 2. Their use of alcohol, tobacco or illicit drugs 3. Their current medications and supplements 4. The patient's functional ability including ADL's, fall risks, home safety risks and hearing or visual impairment. 5. Diet and physical activity 6. Evidence for depression or mood disorders The patients weight, height, BMI have been recorded in the chart.  Hearing and vision has been addressed. I have made referrals, counseling and provided education to the patient based review of the above and I have provided the pt with a written personalized care plan for preventive services. See scanned questionairre.  Reviewed preventative protocols and updated unless pt declined. Vision normal.  Hearing - failed but cerumen in canals - recommended dilute H2O2 home therapy.  Pt not currently interested in audiology referral. Asked him to look into shingles shot and ensure has received pneumovax. Has decided to stop prostate screening. Colonoscopy 2010 - rec rpt 5 yrs ([facility]). No evidence of memory impairment/dementia on exam today.  5/5 calculation, 3/3 registration and recall. Denies any depression/anhedonia, anxiety, irritability.

## 2012-05-05 ENCOUNTER — Other Ambulatory Visit: Payer: Self-pay

## 2012-05-05 MED ORDER — ALBUTEROL SULFATE HFA 108 (90 BASE) MCG/ACT IN AERS: 2.0000 | INHALATION_SPRAY | RESPIRATORY_TRACT | Status: DC | PRN

## 2012-05-05 NOTE — Telephone Encounter (Signed)
If not taking, will remove from list.  Please notify has been removed from list.

## 2012-05-05 NOTE — Telephone Encounter (Signed)
Pt recently seen, pt request refill on albuterol inhaler to [facility]. Pt said he has never taken amlodipine or Norvasc 5 mg; pt saw on med list from [facility] given recently; pt wants to know if he should be taking. [facility].Please advise.

## 2012-05-06 NOTE — Telephone Encounter (Signed)
Advised patient

## 2012-05-21 ENCOUNTER — Telehealth: Payer: Self-pay | Admitting: Family Medicine

## 2012-05-21 NOTE — Telephone Encounter (Signed)
Caller: Jaceyon/Patient; Patient Name: Troy [name]; PCP: Eustaquio Boyden [facility]); Best Callback Phone Number: [phone] Onset- months per pt. Pt c/o of constipation. Last bowel movement was on Sunday 05/17/12. Afebrile. Pt states he feels like he needs to have a BM but can not go. Emergent s/s of Constipation protocol r/o. Pt to see provider within 2 weeks. Pt is using Miralax daily as recommended by provider. Per EPIC notes 02/24/12 plan for constipation pt was to also use colace as directed, if no BM he can do a Dulcolax supp. If no BM from this,pt to call- chart states a possible enema will be discussed at that point. Pt reports understanding.

## 2012-06-05 ENCOUNTER — Ambulatory Visit (INDEPENDENT_AMBULATORY_CARE_PROVIDER_SITE_OTHER): Payer: Medicare Other | Admitting: Family Medicine

## 2012-06-05 ENCOUNTER — Encounter: Payer: Self-pay | Admitting: Family Medicine

## 2012-06-05 VITALS — BP 126/70 | HR 56 | Temp 97.9°F | Wt 168.2 lb

## 2012-06-05 DIAGNOSIS — K5904 Chronic idiopathic constipation: Secondary | ICD-10-CM

## 2012-06-05 DIAGNOSIS — K5900 Constipation, unspecified: Secondary | ICD-10-CM

## 2012-06-05 NOTE — Assessment & Plan Note (Signed)
Chronic issue.   States miralax only works temporarily.  Pt not drinking as much water as he should be - discussed miralax only works with adequate water intake.  Doubt has good fiber intake either. Encouraged to slowly increase fiber intake or consider soluble fiber supplements. Will do trial of amitiza 24 mcg daily - provided with samples. If works well, will send to pharmacy. Asked he call us with update on effect. Not on meds to significantly cause constipation.  Rectal exam WNL today.

## 2012-06-05 NOTE — Progress Notes (Signed)
  Subjective:    Patient ID: Troy [name], male    DOB: 11-Jan-1933, 78 y.o.   MRN: [id number]  HPI CC: constipation  Longstanding issue with constipation for months to years.  Prior was taking miralax regularly but this seems to have stopped working.  Stopped taking about 2 weeks ago.  Now only having BM about once a week if that.  Needing suppository or 3d of regular mag citrate use to have BM.  ie - used dulcolax suppository this morning and had small stool - soft.  Last BM prior to this was 6 d ago.  Usually not hard stools.  Passing gas fine.  No blood in stool.  No abd pain.    Feels urge to go but then not able to have BM.  Colonoscopy 2010 - inh hemorrhoids, 2 polyps.  rec rpt 5 yrs ([facility]).  Past Medical History  Diagnosis Date  . Coronary atherosclerosis of artery bypass graft   . HLD (hyperlipidemia)   . Tobacco use disorder   . Hypertrophy of prostate without urinary obstruction and other lower urinary tract symptoms (LUTS)   . Diabetes mellitus type II ~2008  . Gastrointestinal ulcer 04/2011    with hematemesis s/p EGD by [facility]  . Prostate nodule ~2009    Left-benign s/p eval Uro WNL (Ottelin)  . Chronic obstructive pulmonary disease (COPD)     on CXR  . Barrett's esophagus     EGD 2012, rpt 2014, longterm PPI  . Thrombocytopenia   . HNP (herniated nucleus pulposus), lumbar 2013    with compression of L5 nerve root and foot drop, also with lumbar DDD s/p surgery  . GERD (gastroesophageal reflux disease)     Past Surgical History  Procedure Date  . Coronary artery bypass graft 1999    Dr. Cornelius Moras  . Hemorrhoid surgery   . Nose surgery   . Rotator cuff repair 04/2010    right (but has bilat) Dr. Yisroel Ramming ([facility])  . Colonoscopy 03/2009    small hemorrhoids, 2 polyps (hyperplastic and adenomatous), rpt 5 yrs (Dr. Maryjane Hurter)  . Esophagogastroduodenoscopy 05/16/2011    after hematemesis, no lesions found, consistent with barrett's esophagus rpt 2 yrs  .  Lumbar laminectomy/decompression microdiscectomy 01/31/2012    Procedure: LUMBAR LAMINECTOMY/DECOMPRESSION MICRODISCECTOMY 1 LEVEL;  Surgeon: Carmela Hurt, MD;  Location: [facility];  Service: Neurosurgery;  Laterality: Left;  LEFT Lumbar Four-Five Diskectomy    Review of Systems Per HPI    Objective:   Physical Exam  Nursing note and vitals reviewed. Constitutional: He appears well-developed and well-nourished. No distress.  Abdominal: Soft. Bowel sounds are normal. He exhibits no distension and no mass. There is no tenderness. There is no rebound and no guarding.  Genitourinary: Rectum normal and prostate normal. Rectal exam shows no external hemorrhoid, no internal hemorrhoid, no fissure, no mass, no tenderness and anal tone normal. Prostate is not enlarged (20g) and not tender.       Assessment & Plan:

## 2012-06-05 NOTE — Patient Instructions (Signed)
Try amitiza daily. Drink plenty of water and increase fiber intake - consider daily soluble fiber such as metamucil. Let me know how this is working. Call us with questions.

## 2012-07-31 ENCOUNTER — Ambulatory Visit (INDEPENDENT_AMBULATORY_CARE_PROVIDER_SITE_OTHER): Payer: Medicare Other | Admitting: Internal Medicine

## 2012-07-31 ENCOUNTER — Encounter: Payer: Self-pay | Admitting: Internal Medicine

## 2012-07-31 VITALS — BP 112/60 | HR 64 | Temp 98.2°F | Wt 167.0 lb

## 2012-07-31 DIAGNOSIS — J069 Acute upper respiratory infection, unspecified: Secondary | ICD-10-CM | POA: Insufficient documentation

## 2012-07-31 DIAGNOSIS — J449 Chronic obstructive pulmonary disease, unspecified: Secondary | ICD-10-CM

## 2012-07-31 MED ORDER — AMOXICILLIN 500 MG PO TABS: 1000.0000 mg | ORAL_TABLET | Freq: Two times a day (BID) | ORAL | Status: DC

## 2012-07-31 NOTE — Progress Notes (Signed)
Subjective:    Patient ID: Troy [name], male    DOB: 08-Aug-1933, 78 y.o.   MRN: [id number]  HPI "I feel rough" Frontal headache Clear rhinorrhea Cough which was bad---used listerine/peroxide gargles with some success  Achy all over body Nasal congestion with PND Cough occ productive of clear mucus  No increase in baseline SOB Still smoking --not ready to quit No fever Slight wheezing--- hasn't used albuterol  Has used some tylenol  Current Outpatient Prescriptions on File Prior to Visit  Medication Sig Dispense Refill  . albuterol (PROVENTIL HFA;VENTOLIN HFA) 108 (90 BASE) MCG/ACT inhaler Inhale 2 puffs into the lungs every 4 (four) hours as needed. For shortness or breath, wheezing  1 Inhaler  6  . aspirin 81 MG tablet Take 81 mg by mouth daily.       . isosorbide mononitrate (IMDUR) 30 MG 24 hr tablet Take 0.5 tablets (15 mg total) by mouth daily.  45 tablet  3  . metFORMIN (GLUCOPHAGE) 500 MG tablet Take 1 tablet (500 mg total) by mouth 2 (two) times daily with a meal.  180 tablet  3  . metoprolol (LOPRESSOR) 25 MG tablet Take 1 tablet (25 mg total) by mouth 2 (two) times daily.  180 tablet  3  . nitroGLYCERIN (NITROSTAT) 0.4 MG SL tablet Place 0.4 mg under the tongue every 5 (five) minutes as needed. For chest pain-may repeat x3      . omeprazole (PRILOSEC) 40 MG capsule Take 1 capsule (40 mg total) by mouth daily.  90 capsule  3  . simvastatin (ZOCOR) 40 MG tablet Take 1 tablet (40 mg total) by mouth at bedtime.  90 tablet  3    Allergies  Allergen Reactions  . Codeine     REACTION: vomiting  . Cyclobenzaprine Hcl     REACTION: vomiting  . Hydrocodone Nausea Only and Other (See Comments)    "crazy"  . Valium Other (See Comments)    sedation    Past Medical History  Diagnosis Date  . Coronary atherosclerosis of artery bypass graft   . HLD (hyperlipidemia)   . Tobacco use disorder   . Hypertrophy of prostate without urinary obstruction and other lower  urinary tract symptoms (LUTS)   . Diabetes mellitus type II ~2008  . Gastrointestinal ulcer 04/2011    with hematemesis s/p EGD by [facility]  . Prostate nodule ~2009    Left-benign s/p eval Uro WNL (Ottelin)  . Chronic obstructive pulmonary disease (COPD)     on CXR  . Barrett's esophagus     EGD 2012, rpt 2014, longterm PPI  . Thrombocytopenia   . HNP (herniated nucleus pulposus), lumbar 2013    with compression of L5 nerve root and foot drop, also with lumbar DDD s/p surgery  . GERD (gastroesophageal reflux disease)     Past Surgical History  Procedure Date  . Coronary artery bypass graft 1999    Dr. Cornelius Moras  . Hemorrhoid surgery   . Nose surgery   . Rotator cuff repair 04/2010    right (but has bilat) Dr. Yisroel Ramming ([facility])  . Colonoscopy 03/2009    small hemorrhoids, 2 polyps (hyperplastic and adenomatous), rpt 5 yrs (Dr. Maryjane Hurter)  . Esophagogastroduodenoscopy 05/16/2011    after hematemesis, no lesions found, consistent with barrett's esophagus rpt 2 yrs  . Lumbar laminectomy/decompression microdiscectomy 01/31/2012    Procedure: LUMBAR LAMINECTOMY/DECOMPRESSION MICRODISCECTOMY 1 LEVEL;  Surgeon: Carmela Hurt, MD;  Location: [facility];  Service: Neurosurgery;  Laterality: Left;  LEFT Lumbar Four-Five Diskectomy    Family History  Problem Relation Age of Onset  . Cancer Father 18    colon    History   Social History  . Marital Status: Married    Spouse Name: N/A    Number of Children: N/A  . Years of Education: N/A   Occupational History  . Retired from [employer]     Social History Main Topics  . Smoking status: Current Every Day Smoker -- 1.0 packs/day    Types: Cigarettes  . Smokeless tobacco: Not on file     Comment: < 1 ppd; does dip sometimes  . Alcohol Use: Yes     Comment: Seldom  . Drug Use: No  . Sexually Active: Not on file   Other Topics Concern  . Not on file   Social History Narrative   Lives with wife; No petsHe is very activeHe  was in [employer] for 4 years. He gets meds from the VAActivity: walking daily since back surgeryDiet: good water, not much fruits/vegetables   Review of Systems No rash No vomiting or diarrhea Eating okay     Objective:   Physical Exam  Constitutional: He appears well-developed and well-nourished. No distress.  HENT:  Right Ear: External ear normal.  Left Ear: External ear normal.       No sinus tenderness No sig pharyngeal injection Mild nasal inflammation with thick mucus  Neck: Normal range of motion. Neck supple.  Pulmonary/Chest: Effort normal. No respiratory distress. He has wheezes. He has no rales.       Mildly decreased breath sounds Not tight---normal exp phase Very slight exp wheeze  Lymphadenopathy:    He has no cervical adenopathy.          Assessment & Plan:

## 2012-07-31 NOTE — Patient Instructions (Signed)
Please continue regular tylenol for the aching and get plenty of rest. Start the amoxicillin antibiotic if you get worse---like fever, increased cough or nasty drainage Use the albuterol if your wheezing worsens or you are short of breath

## 2012-07-31 NOTE — Assessment & Plan Note (Signed)
Still seems to be viral With prominent aching, could be attenuated flu Discussed supportive care If worsens, would start empiric antibiotic

## 2012-07-31 NOTE — Assessment & Plan Note (Signed)
Mild exacerbation at most Breathing is fine Discussed using albuterol prn No need for steroids

## 2012-08-17 ENCOUNTER — Encounter: Payer: Self-pay | Admitting: Family Medicine

## 2012-08-17 ENCOUNTER — Ambulatory Visit (INDEPENDENT_AMBULATORY_CARE_PROVIDER_SITE_OTHER): Payer: Medicare Other | Admitting: Family Medicine

## 2012-08-17 VITALS — BP 134/68 | HR 56 | Temp 98.1°F | Wt 170.2 lb

## 2012-08-17 DIAGNOSIS — J209 Acute bronchitis, unspecified: Secondary | ICD-10-CM

## 2012-08-17 MED ORDER — DOXYCYCLINE HYCLATE 100 MG PO CAPS: 100.0000 mg | ORAL_CAPSULE | Freq: Two times a day (BID) | ORAL | Status: DC

## 2012-08-17 MED ORDER — ALBUTEROL SULFATE HFA 108 (90 BASE) MCG/ACT IN AERS: 2.0000 | INHALATION_SPRAY | RESPIRATORY_TRACT | Status: DC | PRN

## 2012-08-17 NOTE — Assessment & Plan Note (Signed)
With very mild COPD flare - recommended regular albuterol use, and will treat bronchitis with doxy. If not improving as expected, consider steroid taper.

## 2012-08-17 NOTE — Progress Notes (Signed)
  Subjective:    Patient ID: Troy [name], male    DOB: [birth year], 78 y.o.   MRN: [id number]  HPI CC: URI?  Seen here 07/31/2012 by Dr. Elbert Ewings with 3-4 d h/o sxs, dx URI with mild COPD exac, prescribed amoxicillin WASP and rec use albuterol regularly.  Amoxicillin didn't help.  States has been out of albuterol for last several months.  Continued RN, ST, dry cough, body aches, chills.  + wheezing and SOB.  Endorses congestion in head and in chest.  No fevers.  No more HA, no ear or tooth pain.    No sick contacts at home. Smoking - has cut back since feeling ill.   + h/o COPD  Past Medical History  Diagnosis Date  . Coronary atherosclerosis of artery bypass graft   . HLD (hyperlipidemia)   . Tobacco use disorder   . Hypertrophy of prostate without urinary obstruction and other lower urinary tract symptoms (LUTS)   . Diabetes mellitus type II ~2008  . Gastrointestinal ulcer 04/2011    with hematemesis s/p EGD by [facility]  . Prostate nodule ~2009    Left-benign s/p eval Uro WNL (Ottelin)  . Chronic obstructive pulmonary disease (COPD)     on CXR  . Barrett's esophagus     EGD 2012, rpt 2014, longterm PPI  . Thrombocytopenia   . HNP (herniated nucleus pulposus), lumbar 2013    with compression of L5 nerve root and foot drop, also with lumbar DDD s/p surgery  . GERD (gastroesophageal reflux disease)      Past Surgical History  Procedure Date  . Coronary artery bypass graft 1999    Dr. Cornelius Moras  . Hemorrhoid surgery   . Nose surgery   . Rotator cuff repair 04/2010    right (but has bilat) Dr. Yisroel Ramming ([facility])  . Colonoscopy 03/2009    small hemorrhoids, 2 polyps (hyperplastic and adenomatous), rpt 5 yrs (Dr. Maryjane Hurter)  . Esophagogastroduodenoscopy 05/16/2011    after hematemesis, no lesions found, consistent with barrett's esophagus rpt 2 yrs  . Lumbar laminectomy/decompression microdiscectomy 01/31/2012    Procedure: LUMBAR LAMINECTOMY/DECOMPRESSION MICRODISCECTOMY 1 LEVEL;   Surgeon: Carmela Hurt, MD;  Location: [facility];  Service: Neurosurgery;  Laterality: Left;  LEFT Lumbar Four-Five Diskectomy    Review of Systems Per HPI    Objective:   Physical Exam  Nursing note and vitals reviewed. Constitutional: He appears well-developed and well-nourished. No distress.  HENT:  Head: Normocephalic and atraumatic.  Right Ear: Hearing, tympanic membrane, external ear and ear canal normal.  Left Ear: Hearing, tympanic membrane, external ear and ear canal normal.  Nose: Nose normal. No mucosal edema or rhinorrhea. Right sinus exhibits no maxillary sinus tenderness and no frontal sinus tenderness. Left sinus exhibits no maxillary sinus tenderness and no frontal sinus tenderness.  Mouth/Throat: Uvula is midline and mucous membranes are normal. Posterior oropharyngeal edema and posterior oropharyngeal erythema present. No oropharyngeal exudate or tonsillar abscesses.  Eyes: Conjunctivae normal and EOM are normal. Pupils are equal, round, and reactive to light.  Neck: Normal range of motion. Neck supple.  Cardiovascular: Normal rate, regular rhythm, normal heart sounds and intact distal pulses.   Pulmonary/Chest: Effort normal. No respiratory distress. He has wheezes (faint exp wheezing). He has rhonchi (R>L). He has no rales.       Coarse breath sounds  Lymphadenopathy:    He has no cervical adenopathy.       Assessment & Plan:

## 2012-08-17 NOTE — Patient Instructions (Signed)
I think you have bronchitis with mild COPD flare. Treat with doxycycline twice daily for 10 days Use albuterol inhaler regularly (2-3 times daily) for next 2-3 days Let me know if worsening, or in 1 week if no improvement for possible steroid course.

## 2012-08-17 NOTE — Addendum Note (Signed)
Addended by: Eustaquio Boyden on: 08/17/2012 11:34 AM   Modules accepted: Orders

## 2012-09-11 ENCOUNTER — Encounter: Payer: Self-pay | Admitting: Family Medicine

## 2012-09-11 ENCOUNTER — Ambulatory Visit (INDEPENDENT_AMBULATORY_CARE_PROVIDER_SITE_OTHER): Payer: Medicare Other | Admitting: Family Medicine

## 2012-09-11 VITALS — BP 120/64 | HR 61 | Temp 98.5°F | Ht 70.25 in | Wt 167.8 lb

## 2012-09-11 DIAGNOSIS — K5900 Constipation, unspecified: Secondary | ICD-10-CM

## 2012-09-11 DIAGNOSIS — K5904 Chronic idiopathic constipation: Secondary | ICD-10-CM

## 2012-09-11 MED ORDER — LUBIPROSTONE 8 MCG PO CAPS: 8.0000 ug | ORAL_CAPSULE | Freq: Two times a day (BID) | ORAL | Status: DC

## 2012-09-11 NOTE — Progress Notes (Signed)
Nature conservation officer at [facility] [address] [place] [address] Phone: [phone] Fax: [phone]  Date:  09/11/2012   Name:  Troy [name]   DOB:  18-Mar-1933   MRN:  [id number] Gender: male Age: 78 y.o.  PCP:  Eustaquio Boyden, MD  Evaluating MD: Hannah Beat, MD   Chief Complaint: Constipation   History of Present Illness:  Troy [name] is a 78 y.o. pleasant patient who presents with the following:  Acute on chronic idiopathic constipation there has been ongoing for at least 2 years, but flared up over the last 2 months. He has taken multiple things in the past, including MiraLax, stool softeners, and most recently he has been taking a least 2 boxes worth of Dulcolax suppositories. He is having stool with a small hard balls. Infrequent bowel movements. Some discomfort in his abdomen.  Dr. Reece Agar also gave him some amitiza before, but he doesn't remember about it.  Has taken 2 dulcolax suppositories:  Nothing in the last few days - a couple of months. Will have a little with some dulcolax.   Patient Active Problem List  Diagnosis  . DIABETES MELLITUS, TYPE II  . HYPERCHOLESTEROLEMIA  . TOBACCO ABUSE  . Chronic idiopathic constipation  . OTHER CHEST PAIN  . COPD, mild  . Medicare annual wellness visit, subsequent  . PUD (peptic ulcer disease)  . Skin rash  . Bronchitis, acute, with bronchospasm  . Thrombocytopenia  . Barrett's esophagus  . Lumbosacral radiculopathy at L5  . URI (upper respiratory infection)    Past Medical History  Diagnosis Date  . Coronary atherosclerosis of artery bypass graft   . HLD (hyperlipidemia)   . Tobacco use disorder   . Hypertrophy of prostate without urinary obstruction and other lower urinary tract symptoms (LUTS)   . Diabetes mellitus type II ~2008  . Gastrointestinal ulcer 04/2011    with hematemesis s/p EGD by [facility]  . Prostate nodule ~2009    Left-benign s/p eval Uro WNL (Ottelin)  . Chronic obstructive  pulmonary disease (COPD)     on CXR  . Barrett's esophagus     EGD 2012, rpt 2014, longterm PPI  . Thrombocytopenia   . HNP (herniated nucleus pulposus), lumbar 2013    with compression of L5 nerve root and foot drop, also with lumbar DDD s/p surgery  . GERD (gastroesophageal reflux disease)     Past Surgical History  Procedure Date  . Coronary artery bypass graft 1999    Dr. Cornelius Moras  . Hemorrhoid surgery   . Nose surgery   . Rotator cuff repair 04/2010    right (but has bilat) Dr. Yisroel Ramming ([facility])  . Colonoscopy 03/2009    small hemorrhoids, 2 polyps (hyperplastic and adenomatous), rpt 5 yrs (Dr. Maryjane Hurter)  . Esophagogastroduodenoscopy 05/16/2011    after hematemesis, no lesions found, consistent with barrett's esophagus rpt 2 yrs  . Lumbar laminectomy/decompression microdiscectomy 01/31/2012    Procedure: LUMBAR LAMINECTOMY/DECOMPRESSION MICRODISCECTOMY 1 LEVEL;  Surgeon: Carmela Hurt, MD;  Location: [facility];  Service: Neurosurgery;  Laterality: Left;  LEFT Lumbar Four-Five Diskectomy    History  Substance Use Topics  . Smoking status: Current Every Day Smoker -- 1.0 packs/day    Types: Cigarettes  . Smokeless tobacco: Not on file     Comment: < 1 ppd; does dip sometimes  . Alcohol Use: Yes     Comment: Seldom    Family History  Problem Relation Age of Onset  .  Cancer Father 66    colon    Allergies  Allergen Reactions  . Codeine     REACTION: vomiting  . Cyclobenzaprine Hcl     REACTION: vomiting  . Hydrocodone Nausea Only and Other (See Comments)    "crazy"  . Valium Other (See Comments)    sedation    Medication list has been reviewed and updated.  Outpatient Prescriptions Prior to Visit  Medication Sig Dispense Refill  . albuterol (PROVENTIL HFA;VENTOLIN HFA) 108 (90 BASE) MCG/ACT inhaler Inhale 2 puffs into the lungs every 4 (four) hours as needed. For shortness or breath, wheezing  1 Inhaler  6  . aspirin 81 MG tablet Take 81 mg by mouth  daily.       . isosorbide mononitrate (IMDUR) 30 MG 24 hr tablet Take 0.5 tablets (15 mg total) by mouth daily.  45 tablet  3  . metFORMIN (GLUCOPHAGE) 500 MG tablet Take 1 tablet (500 mg total) by mouth 2 (two) times daily with a meal.  180 tablet  3  . metoprolol (LOPRESSOR) 25 MG tablet Take 1 tablet (25 mg total) by mouth 2 (two) times daily.  180 tablet  3  . nitroGLYCERIN (NITROSTAT) 0.4 MG SL tablet Place 0.4 mg under the tongue every 5 (five) minutes as needed. For chest pain-may repeat x3      . simvastatin (ZOCOR) 40 MG tablet Take 1 tablet (40 mg total) by mouth at bedtime.  90 tablet  3  . omeprazole (PRILOSEC) 40 MG capsule Take 1 capsule (40 mg total) by mouth daily.  90 capsule  3  . [DISCONTINUED] amoxicillin (AMOXIL) 500 MG tablet Take 2 tablets (1,000 mg total) by mouth 2 (two) times daily.  40 tablet  0  . [DISCONTINUED] doxycycline (VIBRAMYCIN) 100 MG capsule Take 1 capsule (100 mg total) by mouth 2 (two) times daily.  20 capsule  0   Last reviewed on 09/11/2012 11:00 AM by Hannah Beat, MD  Review of Systems:   GEN: No acute illnesses, no fevers, chills. GI: No n/v/d, eating normally Pulm: No SOB Interactive and getting along well at home.  Otherwise, ROS is as per the HPI.   Physical Examination: Filed Vitals:   09/11/12 1036  BP: 120/64  Pulse: 61  Temp: 98.5 F (36.9 C)  TempSrc: Oral  Height: 5' 10.25" (1.784 m)  Weight: 167 lb 12 oz (76.091 kg)  SpO2: 96%    Body mass index is 23.90 kg/(m^2). Ideal Body Weight: Weight in (lb) to have BMI = 25: 175.1    GEN: WDWN, NAD, Non-toxic, Alert & Oriented x 3 HEENT: Atraumatic, Normocephalic.  Ears and Nose: No external deformity. EXTR: No clubbing/cyanosis/edema NEURO: Normal gait.  PSYCH: Normally interactive. Conversant. Not depressed or anxious appearing.  Calm demeanor.  Rectal: Mildly enlarged prostate, but no fecal impaction is present.  Assessment and Plan:  1. Acute constipation   2.  Chronic idiopathic constipation    Orders chronically using Dulcolax may actually have making worse of recently. I may have him stop this and wrote out a protocol for breaking him through this juncture of acute constipation and for maintenance.  Refer to the patient instructions sections for details of plan shared with patient.   Orders Today:  No orders of the defined types were placed in this encounter.    Updated Medication List: (Includes new medications, updates to list, dose adjustments) Meds ordered this encounter  Medications  . lubiprostone (AMITIZA) 8 MCG capsule  Sig: Take 1 capsule (8 mcg total) by mouth 2 (two) times daily with a meal.    Dispense:  60 capsule    Refill:  3    Medications Discontinued: Medications Discontinued During This Encounter  Medication Reason  . amoxicillin (AMOXIL) 500 MG tablet Error  . doxycycline (VIBRAMYCIN) 100 MG capsule Error     Hannah Beat, MD

## 2012-09-11 NOTE — Patient Instructions (Addendum)
Miralax: take 1 dose every 2 hours until a bowel movement. Start amitiza twice a day with breakfast and dinner  Then take twice a day.  2. Prevention: drink 8 glasses water daily, FIBER (raw fruit, veggies, bran cereal, whole grains), regular exercise 3. Bulk formers like BENEFIBER OR Citrucel (methylcellulose) usually help   6. Overuse of stimulant  laxatives (Ex-Lax, Mag Citrate, Dulcolax, etc.) can be habit forming

## 2012-10-01 ENCOUNTER — Telehealth: Payer: Self-pay | Admitting: *Deleted

## 2012-10-01 MED ORDER — LUBIPROSTONE 24 MCG PO CAPS: 24.0000 ug | ORAL_CAPSULE | Freq: Two times a day (BID) | ORAL | Status: DC

## 2012-10-01 NOTE — Telephone Encounter (Addendum)
Called left message. Please call tomorrow - how many days has he regularly been taking amitiza 8mg  twice daily and miralax daily? If regularly for 1+ wks, recommend increase amitiza to twice daily (sent in new dose to pharmacy), continue miralax and start citrucel or benefiber daily. To do this regularly for the next week, if no improvement to update [place] next Thursday.

## 2012-10-01 NOTE — Telephone Encounter (Signed)
Spoke with patient and he said he feels that his bowels are blocked. He said he is having BM's twice daily but they are extremely small. He denies vomiting or abd swelling. He does feel bloated. He is eating normally and denies early fullness. Urinating okay. He is taking miralax and amitiza BID. He did not start Benefiber or citrucel as suggested by Dr. Patsy Lager. Please advise as to what else you think he needs to do.

## 2012-10-02 NOTE — Telephone Encounter (Signed)
He has been taking it regularly since he saw Dr. Patsy Lager. Patient informed of new changes. He wrote them down and verbalized understanding. He will call in 1 week if no improvement.

## 2012-10-08 ENCOUNTER — Telehealth: Payer: Self-pay | Admitting: Family Medicine

## 2012-10-08 NOTE — Telephone Encounter (Signed)
Patient Information:  Caller Name: Igor  Phone: [phone]  Patient: Troy [name], Troy [name]  Gender: Male  DOB: 01/08/1933  Age: 79 Years  PCP: Eustaquio Boyden [facility])  Office Follow Up:  Does the office need to follow up with this patient?: Yes  Instructions For The Office: Patient declines offered appt. for 10/08/12. Patient states, " I want to know what else I can do." Care advice and diet advice given per guidelines. Patient advised to continue medications as prescribed. Advised increased fluids and fiber. Patient uses [facility] in [place] at [phone]. Patient can be reached at [phone]. Please return call to patient with recommendation.  RN Note:  Patient states he was advised to return call to [facility], in office 10/08/12. Patient states he has been taking Miralax BID, Amitiza . BID, Benefiber daily since 10/02/12, as directed, for constipation. Patient states he is having one small, loose bowel movement q 3 days. Last bowel movement was 10/08/11, small, loose brown.  Patient states he does not feel like he is empyting his bowels. Patient states he continues to have abdominal bloating. Patient states he feels like his bowels are " blocked." Patient states he is leaking liquid bowel movement " once in awhile." Care advice given per guidelines. Call back parameters reviewed. Patient verbalizes understanding. Patient declines offered appt. for 10/08/12. Patient states, " I want to know what else I can do." Care advice and diet advice given per guidelines. Patient advised to continue medications as prescribed. Advised increased fluids and fiber. Patient uses [facility] in [place] at [phone]. Patient can be reached at [phone].  Symptoms  Reason For Call & Symptoms: Problems with bowel movements/bloating  Reviewed Health History In EMR: Yes  Reviewed Medications In EMR: Yes  Reviewed Allergies In EMR: Yes  Reviewed Surgeries / Procedures: Yes  Date  of Onset of Symptoms: 09/11/2012  Treatments Tried: Amitiza, Miralax, Benefiber  Treatments Tried Worked: No  Guideline(s) Used:  Constipation  Disposition Per Guideline:   See Today in Office  Reason For Disposition Reached:   Leaking stool  Advice Given:  High Fiber Diet:  Try to eat fresh fruit and vegetables at each meal (peas, prunes, citrus, apples, beans, corn).  Eat more grain foods (bran flakes, bran muffins, graham crackers, oatmeal, brown rice, and whole wheat bread). Popcorn is a source of fiber.  Liquids:  Drink 6-8 glasses of water a day (Caution: certain medical conditions require fluid restriction).  Prune juice is a natural laxative.  Call Back If:  You become worse  Patient Refused Recommendation:  Patient Will Follow Up With Office Later  Patient declines offered appt. for 10/08/12. Patient states, " I want to know what else I can do." Care advice and diet advice given per guidelines. Patient advised to continue medications as prescribed. Advised increased fluids and fiber. Patient uses [facility] in [place] at [phone]. Patient can be reached at [phone].

## 2012-10-08 NOTE — Telephone Encounter (Signed)
Taking miralax bid, amitiza bid, and benefiber daily. Recommend try dulcolax suppository today and tomorrow, if no resolution, come into office tomorrow for eval. I will ask Selena Batten to call tomorrow am to schedule in afternoon if no success.

## 2012-10-09 ENCOUNTER — Encounter: Payer: Self-pay | Admitting: Family Medicine

## 2012-10-09 ENCOUNTER — Ambulatory Visit (INDEPENDENT_AMBULATORY_CARE_PROVIDER_SITE_OTHER): Payer: Medicare Other | Admitting: Family Medicine

## 2012-10-09 VITALS — BP 118/64 | HR 72 | Temp 98.1°F | Wt 164.2 lb

## 2012-10-09 DIAGNOSIS — K5900 Constipation, unspecified: Secondary | ICD-10-CM

## 2012-10-09 DIAGNOSIS — K5904 Chronic idiopathic constipation: Secondary | ICD-10-CM

## 2012-10-09 MED ORDER — MAGNESIUM HYDROXIDE 400 MG/5ML PO SUSP: 15.0000 mL | Freq: Every day | ORAL | Status: DC | PRN

## 2012-10-09 NOTE — Telephone Encounter (Signed)
Spoke with patient and he has had no resolution as of now. Appt scheduled for this afternoon.

## 2012-10-09 NOTE — Patient Instructions (Addendum)
Stop miralax. Continue benefiber and amitiza. Start milk of magnesia - sent to pharmacy.  Use once daily as needed for constipation.  Use only as needed - if not helping then stop. I want you to eat 3 meals a day - not big meals, but try to eat something 3 times a day. Pass by [facility] office for referral to Dr. Ewing Schlein.

## 2012-10-09 NOTE — Assessment & Plan Note (Signed)
No evidence of impaction on exam today. Anticipate component of not eating enough - recommended increase solid food intake. Stop miralax - as stools are very watery - but still residual urge to go despite watery BM. Recommend trial of milk of magnesia for next several days along with continued amitiza and benefiber. Some weight loss noted - will refer to GI for further eval of constipation and stool urgency.

## 2012-10-09 NOTE — Progress Notes (Signed)
  Subjective:    Patient ID: Troy [name], male    DOB: 1933-03-21, 79 y.o.   MRN: [id number]  HPI CC: constipation  Chronic constipation issues.  Has been on miralax bid, amitiza bid, for the last month, and benefiber daily was added last week.  With medicines, some watery stools.  When has formed stool, very small amount.   Tried dulcolax suppository - very small BM.  Continued stool urgency, but when goes only passes wind.  Used to hold stools.  Prior regular regimen was BM q2-3 days.  Denies fevers/chills, nausea/vomiting, minimal abdominal pain, blood in stool, dysuria, rectal pain.  Diet - eating 2 meals a day or less.  Breakfast and dinner.  Average meal: 1 bowl cereal with banana for breakfast and 2 sandwiches for dinner - chicken salad and banana.  Drinks water and tea/pepsi.  No vegetables yesterday.  Wt Readings from Last 3 Encounters:  10/09/12 164 lb 4 oz (74.503 kg)  09/11/12 167 lb 12 oz (76.091 kg)  08/17/12 170 lb 4 oz (77.225 kg)    Past Medical History  Diagnosis Date  . Coronary atherosclerosis of artery bypass graft   . HLD (hyperlipidemia)   . Tobacco use disorder   . Hypertrophy of prostate without urinary obstruction and other lower urinary tract symptoms (LUTS)   . Diabetes mellitus type II ~2008  . Gastrointestinal ulcer 04/2011    with hematemesis s/p EGD by [facility]  . Prostate nodule ~2009    Left-benign s/p eval Uro WNL (Ottelin)  . Chronic obstructive pulmonary disease (COPD)     on CXR  . Barrett's esophagus     EGD 2012, rpt 2014, longterm PPI  . Thrombocytopenia   . HNP (herniated nucleus pulposus), lumbar 2013    with compression of L5 nerve root and foot drop, also with lumbar DDD s/p surgery  . GERD (gastroesophageal reflux disease)     Past Surgical History  Procedure Date  . Coronary artery bypass graft 1999    Dr. Cornelius Moras  . Hemorrhoid surgery   . Nose surgery   . Rotator cuff repair 04/2010    right (but has bilat) Dr. Yisroel Ramming  ([facility])  . Colonoscopy 03/2009    small hemorrhoids, 2 polyps (hyperplastic and adenomatous), rpt 5 yrs (Dr. Maryjane Hurter)  . Esophagogastroduodenoscopy 05/16/2011    after hematemesis, no lesions found, consistent with barrett's esophagus rpt 2 yrs  . Lumbar laminectomy/decompression microdiscectomy 01/31/2012    Procedure: LUMBAR LAMINECTOMY/DECOMPRESSION MICRODISCECTOMY 1 LEVEL;  Surgeon: Carmela Hurt, MD;  Location: [facility];  Service: Neurosurgery;  Laterality: Left;  LEFT Lumbar Four-Five Diskectomy   Review of Systems Per HPI    Objective:   Physical Exam  Nursing note and vitals reviewed. Constitutional: He appears well-developed and well-nourished. No distress.  Abdominal: Soft. Bowel sounds are normal. He exhibits no distension and no mass. There is no tenderness. There is no rebound and no guarding.  Genitourinary: Prostate normal. Rectal exam shows external hemorrhoid (noninflamed). Rectal exam shows no internal hemorrhoid, no fissure, no mass, no tenderness and anal tone normal. Prostate is not enlarged and not tender.       No impaction appreciated.  Small amt soft stool in upper rectal vault.      Assessment & Plan:

## 2012-10-16 ENCOUNTER — Ambulatory Visit
Admission: RE | Admit: 2012-10-16 | Discharge: 2012-10-16 | Disposition: A | Discharge status: Home / Self Care | Payer: Medicare Other | Source: Ambulatory Visit | Attending: Gastroenterology | Admitting: Gastroenterology

## 2012-10-16 ENCOUNTER — Other Ambulatory Visit: Payer: Self-pay | Admitting: Gastroenterology

## 2012-10-16 DIAGNOSIS — D649 Anemia, unspecified: Secondary | ICD-10-CM

## 2012-10-16 DIAGNOSIS — R140 Abdominal distension (gaseous): Secondary | ICD-10-CM

## 2012-10-16 DIAGNOSIS — K5900 Constipation, unspecified: Secondary | ICD-10-CM

## 2012-10-16 DIAGNOSIS — R634 Abnormal weight loss: Secondary | ICD-10-CM

## 2012-10-23 ENCOUNTER — Telehealth: Payer: Self-pay | Admitting: *Deleted

## 2012-10-23 NOTE — Telephone Encounter (Signed)
Form for diabetic testing supplies in your IN box 

## 2012-10-25 NOTE — Telephone Encounter (Signed)
Filled and placed in Kim's box. 

## 2012-10-26 ENCOUNTER — Encounter: Payer: Self-pay | Admitting: Family Medicine

## 2012-10-26 ENCOUNTER — Ambulatory Visit (INDEPENDENT_AMBULATORY_CARE_PROVIDER_SITE_OTHER): Payer: Medicare Other | Admitting: Family Medicine

## 2012-10-26 VITALS — BP 142/74 | HR 60 | Temp 97.8°F | Wt 169.2 lb

## 2012-10-26 DIAGNOSIS — F17200 Nicotine dependence, unspecified, uncomplicated: Secondary | ICD-10-CM

## 2012-10-26 DIAGNOSIS — E7800 Pure hypercholesterolemia, unspecified: Secondary | ICD-10-CM

## 2012-10-26 DIAGNOSIS — R634 Abnormal weight loss: Secondary | ICD-10-CM

## 2012-10-26 DIAGNOSIS — K5900 Constipation, unspecified: Secondary | ICD-10-CM

## 2012-10-26 DIAGNOSIS — K5904 Chronic idiopathic constipation: Secondary | ICD-10-CM

## 2012-10-26 DIAGNOSIS — J449 Chronic obstructive pulmonary disease, unspecified: Secondary | ICD-10-CM

## 2012-10-26 DIAGNOSIS — J4489 Other specified chronic obstructive pulmonary disease: Secondary | ICD-10-CM

## 2012-10-26 DIAGNOSIS — E119 Type 2 diabetes mellitus without complications: Secondary | ICD-10-CM

## 2012-10-26 NOTE — Assessment & Plan Note (Signed)
Improved with daily linzess.  Currently using samples by GI.

## 2012-10-26 NOTE — Patient Instructions (Addendum)
Schedule eye exam at your convenience. Keep thinking about quitting smoking - best thing for your health. Return in 6 months for medicare wellness visit, prior fasting for blood work.

## 2012-10-26 NOTE — Progress Notes (Signed)
  Subjective:    Patient ID: Troy [name], male    DOB: Nov 05, 1932, 79 y.o.   MRN: [id number]  HPI CC: 6 mo f/u  Constipation - see prior note for details.  Seen by GI - continued miralax, milk of magnesia, started on linzess and stopped amitiza.  1 stool a day with taking linzess.  Not taking milk of magnesia or miralax.  Good fiber, water.   COPD - by CXR - emphysema.  60 + PY hx.  Not interested in quitting.  CAD - No HA, vision changes, CP/tightness, SOB, leg swelling.  No h/o HTN.  Compliant with metoprolol 25mg  bid.  DM - occasionally checks blood sugars at home.  Running well per patient.  Occasional paresthesias worse at night.  Last vision screen unsure, >1 yr ago.  HLD - compliant with statin.  No myalgias.  Wt Readings from Last 3 Encounters:  10/26/12 169 lb 4 oz (76.771 kg)  10/09/12 164 lb 4 oz (74.503 kg)  09/11/12 167 lb 12 oz (76.091 kg)    Past Medical History  Diagnosis Date  . Coronary atherosclerosis of artery bypass graft   . HLD (hyperlipidemia)   . Tobacco use disorder   . Hypertrophy of prostate without urinary obstruction and other lower urinary tract symptoms (LUTS)   . Diabetes mellitus type II ~2008  . Gastrointestinal ulcer 04/2011    with hematemesis s/p EGD by [facility]  . Prostate nodule ~2009    Left-benign s/p eval Uro WNL (Ottelin)  . Chronic obstructive pulmonary disease (COPD)     on CXR  . Barrett's esophagus     EGD 2012, rpt 2014, longterm PPI  . Thrombocytopenia   . HNP (herniated nucleus pulposus), lumbar 2013    with compression of L5 nerve root and foot drop, also with lumbar DDD s/p surgery  . GERD (gastroesophageal reflux disease)      Review of Systems Per HPI    Objective:   Physical Exam  Nursing note and vitals reviewed. Constitutional: He appears well-developed and well-nourished. No distress.  HENT:  Mouth/Throat: Oropharynx is clear and moist. No oropharyngeal exudate.  Eyes: Conjunctivae and EOM are normal.  Pupils are equal, round, and reactive to light. No scleral icterus.  Neck: Normal range of motion. Neck supple.  Cardiovascular: Normal rate, regular rhythm, normal heart sounds and intact distal pulses.   No murmur heard. Pulmonary/Chest: Effort normal and breath sounds normal. No respiratory distress. He has no wheezes. He has no rales.  coarse  Skin: Skin is warm and dry.  Psychiatric: He has a normal mood and affect.       Assessment & Plan:

## 2012-10-26 NOTE — Assessment & Plan Note (Signed)
Chronic, stable. Check A1c next visit.

## 2012-10-26 NOTE — Telephone Encounter (Signed)
Form faxed

## 2012-10-26 NOTE — Assessment & Plan Note (Signed)
Continue to encourage cessation. Resistant to cutting back. Discussed LRCT for lung cancer screening - pt declines currently.  See below.

## 2012-10-26 NOTE — Assessment & Plan Note (Signed)
Discussed noted 10lb weight loss without trying. Pt will monitor, update me if continued prior to 6 mo AMW. Will again discuss lung cancer screening at [facility].  Pt agrees with plan.

## 2012-10-26 NOTE — Assessment & Plan Note (Signed)
Denies trouble with cough or dyspnea. Discussed if noting worsening trouble, recommend spirometry to see if would benefit from daily controller medication.

## 2012-10-26 NOTE — Assessment & Plan Note (Addendum)
Chronic, stable. Tolerating statin. Check FLP at [facility]

## 2012-11-10 ENCOUNTER — Ambulatory Visit (INDEPENDENT_AMBULATORY_CARE_PROVIDER_SITE_OTHER): Payer: Medicare Other | Admitting: Family Medicine

## 2012-11-10 ENCOUNTER — Ambulatory Visit: Payer: Medicare Other | Admitting: Family Medicine

## 2012-11-10 ENCOUNTER — Encounter: Payer: Self-pay | Admitting: Family Medicine

## 2012-11-10 VITALS — BP 150/68 | HR 82 | Temp 100.6°F | Wt 167.8 lb

## 2012-11-10 DIAGNOSIS — J441 Chronic obstructive pulmonary disease with (acute) exacerbation: Secondary | ICD-10-CM | POA: Insufficient documentation

## 2012-11-10 MED ORDER — IPRATROPIUM-ALBUTEROL 20-100 MCG/ACT IN AERS: 1.0000 | INHALATION_SPRAY | Freq: Four times a day (QID) | RESPIRATORY_TRACT | Status: DC

## 2012-11-10 MED ORDER — ALBUTEROL SULFATE (2.5 MG/3ML) 0.083% IN NEBU
2.5000 mg | INHALATION_SOLUTION | Freq: Once | RESPIRATORY_TRACT | Status: AC
  Administered 2012-11-10: 2.5 mg via RESPIRATORY_TRACT

## 2012-11-10 MED ORDER — DOXYCYCLINE HYCLATE 100 MG PO CAPS: 100.0000 mg | ORAL_CAPSULE | Freq: Two times a day (BID) | ORAL | Status: DC

## 2012-11-10 MED ORDER — PREDNISONE 20 MG PO TABS: ORAL_TABLET | ORAL | Status: DC

## 2012-11-10 MED ORDER — IPRATROPIUM BROMIDE 0.02 % IN SOLN
0.5000 mg | Freq: Once | RESPIRATORY_TRACT | Status: AC
  Administered 2012-11-10: 0.5 mg via RESPIRATORY_TRACT

## 2012-11-10 NOTE — Progress Notes (Signed)
  Subjective:    Patient ID: Troy [name], male    DOB: 05/06/1933, 79 y.o.   MRN: [id number]  HPI CC: cough  2d h/o cough and wheezing with SOB mildly productive of yellow sputum.  Fever in office today. None at home. + Headache.  Chest and head congestion.  No abd pain, ear or tooth pain.  tylenol has not helped.  H/o COPD - no recent spirometry.  Continued smoker. No sick contacts.  Past Medical History  Diagnosis Date  . Coronary atherosclerosis of artery bypass graft   . HLD (hyperlipidemia)   . Tobacco use disorder   . Hypertrophy of prostate without urinary obstruction and other lower urinary tract symptoms (LUTS)   . Diabetes mellitus type II ~2008  . Gastrointestinal ulcer 04/2011    with hematemesis s/p EGD by [facility]  . Prostate nodule ~2009    Left-benign s/p eval Uro WNL (Ottelin)  . Chronic obstructive pulmonary disease (COPD)     on CXR  . Barrett's esophagus     EGD 2012, rpt 2014, longterm PPI  . Thrombocytopenia   . HNP (herniated nucleus pulposus), lumbar 2013    with compression of L5 nerve root and foot drop, also with lumbar DDD s/p surgery  . GERD (gastroesophageal reflux disease)      Review of Systems Per HPI    Objective:   Physical Exam  Nursing note and vitals reviewed. Constitutional: He appears well-developed and well-nourished. No distress.  HENT:  Head: Normocephalic and atraumatic.  Mouth/Throat: Oropharynx is clear and moist. No oropharyngeal exudate.  Eyes: Conjunctivae and EOM are normal. Pupils are equal, round, and reactive to light. No scleral icterus.  Neck: Normal range of motion. Neck supple.  Cardiovascular: Normal rate, regular rhythm, normal heart sounds and intact distal pulses.   No murmur heard. Pulmonary/Chest: No accessory muscle usage. No respiratory distress. He has decreased breath sounds. He has no wheezes. He has no rhonchi. He has no rales.  Tight and coarse throughout  Musculoskeletal: He exhibits no edema.    After albuterol/atrovent neb - improved air movement, exp wheezing, coarse breath sounds    Assessment & Plan:

## 2012-11-10 NOTE — Assessment & Plan Note (Addendum)
Alb/atrovent neb in office - improved air movement, no rales heard.  Uncovered wheeze. Treat with steroid course, doxycycline, and duonebs. Red flags to seek urgent care discussed. No evidence of pneumonia today.

## 2012-11-10 NOTE — Patient Instructions (Addendum)
You have COPD flare. Treat with steroid course, antibiotic prescribed doxycycline, and combivent inhaler - use inhaler as needed, start regularly two to three times daily for next 3 days. Keep working on cutting back/quitting smoking - best thing for your lungs. Once you're feeling better, return for lung function tests (spirometry in 4-6 weeks). If any persistent fever >101, worsening cough or trouble breathing, please return here right away or seek urgent care.  Chronic Obstructive Pulmonary Disease Chronic obstructive pulmonary disease (COPD) is a condition in which airflow from the lungs is restricted. The lungs can never return to normal, but there are measures you can take which will improve them and make you feel better. CAUSES   Smoking.  Exposure to secondhand smoke.  Breathing in irritants (pollution, cigarette smoke, strong smells, aerosol sprays, paint fumes).  History of lung infections. TREATMENT  Treatment focuses on making you comfortable (supportive care). Your caregiver may prescribe medications (inhaled or pills) to help improve your breathing. HOME CARE INSTRUCTIONS   If you smoke, stop smoking.  Avoid exposure to smoke, chemicals, and fumes that aggravate your breathing.  Take antibiotic medicines as directed by your caregiver.  Avoid medicines that dry up your system and slow down the elimination of secretions (antihistamines and cough syrups). This decreases respiratory capacity and may lead to infections.  Drink enough water and fluids to keep your urine clear or pale yellow. This loosens secretions.  Use humidifiers at home and at your bedside if they do not make breathing difficult.  Receive all protective vaccines your caregiver suggests, especially pneumococcal and influenza.  Use home oxygen as suggested.  Stay active. Exercise and physical activity will help maintain your ability to do things you want to do.  Eat a healthy diet. SEEK MEDICAL CARE  IF:   You develop pus-like mucus (sputum).  Breathing is more labored or exercise becomes difficult to do.  You are running out of the medicine you take for your breathing. SEEK IMMEDIATE MEDICAL CARE IF:   You have a rapid heart rate.  You have agitation, confusion, tremors, or are in a stupor (family members may need to observe this).  It becomes difficult to breathe.  You develop chest pain.  You have a fever. MAKE SURE YOU:   Understand these instructions.  Will watch your condition.  Will get help right away if you are not doing well or get worse. Document Released: 06/12/2005 Document Revised: 11/25/2011 Document Reviewed: 11/02/2010 [facility] Patient Information 2013 [place], [place].

## 2012-11-18 ENCOUNTER — Telehealth: Payer: Self-pay | Admitting: *Deleted

## 2012-11-18 MED ORDER — LEVOFLOXACIN 500 MG PO TABS: 500.0000 mg | ORAL_TABLET | Freq: Every day | ORAL | Status: DC

## 2012-11-18 NOTE — Telephone Encounter (Signed)
Seen 11/10/2012 with bronchitis and COPD exacerbation, treated with prednisone course as well as doxycycline course. I called him back - continued productive cough, SOB some, fatigued and weak. Will place on levaquin.  If not better after this, to come in for office visit to reassess. Pt agrees with plan.

## 2012-11-18 NOTE — Telephone Encounter (Signed)
Patient called and said he feels absolutely no better. The medicine you prescribed is not strong enough and has not helped at all. He wants you to call in the "strongest medicine you can" to get rid of what he has.

## 2012-11-23 ENCOUNTER — Observation Stay (HOSPITAL_COMMUNITY)
Admission: EM | Admit: 2012-11-23 | Discharge: 2012-11-24 | Disposition: A | Discharge status: Home / Self Care | Payer: Medicare Other | Attending: Internal Medicine | Admitting: Internal Medicine

## 2012-11-23 ENCOUNTER — Emergency Department (HOSPITAL_COMMUNITY): Payer: Medicare Other

## 2012-11-23 ENCOUNTER — Encounter (HOSPITAL_COMMUNITY): Payer: Self-pay | Admitting: *Deleted

## 2012-11-23 DIAGNOSIS — I471 Supraventricular tachycardia, unspecified: Secondary | ICD-10-CM

## 2012-11-23 DIAGNOSIS — I4891 Unspecified atrial fibrillation: Secondary | ICD-10-CM

## 2012-11-23 DIAGNOSIS — R634 Abnormal weight loss: Secondary | ICD-10-CM

## 2012-11-23 DIAGNOSIS — J209 Acute bronchitis, unspecified: Secondary | ICD-10-CM

## 2012-11-23 DIAGNOSIS — R079 Chest pain, unspecified: Secondary | ICD-10-CM | POA: Insufficient documentation

## 2012-11-23 DIAGNOSIS — Z79899 Other long term (current) drug therapy: Secondary | ICD-10-CM | POA: Insufficient documentation

## 2012-11-23 DIAGNOSIS — R21 Rash and other nonspecific skin eruption: Secondary | ICD-10-CM

## 2012-11-23 DIAGNOSIS — Z951 Presence of aortocoronary bypass graft: Secondary | ICD-10-CM | POA: Insufficient documentation

## 2012-11-23 DIAGNOSIS — E7800 Pure hypercholesterolemia, unspecified: Secondary | ICD-10-CM

## 2012-11-23 DIAGNOSIS — R5381 Other malaise: Principal | ICD-10-CM | POA: Insufficient documentation

## 2012-11-23 DIAGNOSIS — J441 Chronic obstructive pulmonary disease with (acute) exacerbation: Secondary | ICD-10-CM

## 2012-11-23 DIAGNOSIS — Z0000 Encounter for general adult medical examination without abnormal findings: Secondary | ICD-10-CM

## 2012-11-23 DIAGNOSIS — I2510 Atherosclerotic heart disease of native coronary artery without angina pectoris: Secondary | ICD-10-CM

## 2012-11-23 DIAGNOSIS — K2270 Barrett's esophagus without dysplasia: Secondary | ICD-10-CM

## 2012-11-23 DIAGNOSIS — D696 Thrombocytopenia, unspecified: Secondary | ICD-10-CM

## 2012-11-23 DIAGNOSIS — M5417 Radiculopathy, lumbosacral region: Secondary | ICD-10-CM

## 2012-11-23 DIAGNOSIS — R0789 Other chest pain: Secondary | ICD-10-CM

## 2012-11-23 DIAGNOSIS — J4489 Other specified chronic obstructive pulmonary disease: Secondary | ICD-10-CM | POA: Insufficient documentation

## 2012-11-23 DIAGNOSIS — K5904 Chronic idiopathic constipation: Secondary | ICD-10-CM

## 2012-11-23 DIAGNOSIS — E119 Type 2 diabetes mellitus without complications: Secondary | ICD-10-CM | POA: Insufficient documentation

## 2012-11-23 DIAGNOSIS — Z8679 Personal history of other diseases of the circulatory system: Secondary | ICD-10-CM

## 2012-11-23 DIAGNOSIS — K219 Gastro-esophageal reflux disease without esophagitis: Secondary | ICD-10-CM | POA: Insufficient documentation

## 2012-11-23 DIAGNOSIS — J449 Chronic obstructive pulmonary disease, unspecified: Secondary | ICD-10-CM | POA: Diagnosis present

## 2012-11-23 DIAGNOSIS — K279 Peptic ulcer, site unspecified, unspecified as acute or chronic, without hemorrhage or perforation: Secondary | ICD-10-CM

## 2012-11-23 DIAGNOSIS — E118 Type 2 diabetes mellitus with unspecified complications: Secondary | ICD-10-CM | POA: Diagnosis present

## 2012-11-23 DIAGNOSIS — F17200 Nicotine dependence, unspecified, uncomplicated: Secondary | ICD-10-CM | POA: Insufficient documentation

## 2012-11-23 DIAGNOSIS — R531 Weakness: Secondary | ICD-10-CM

## 2012-11-23 DIAGNOSIS — I498 Other specified cardiac arrhythmias: Secondary | ICD-10-CM

## 2012-11-23 LAB — COMPREHENSIVE METABOLIC PANEL
AST: 19 U/L (ref 0–37)
Albumin: 3.3 g/dL — ABNORMAL LOW (ref 3.5–5.2)
Alkaline Phosphatase: 116 U/L (ref 39–117)
Chloride: 100 mEq/L (ref 96–112)
Potassium: 4.8 mEq/L (ref 3.5–5.1)
Total Bilirubin: 0.3 mg/dL (ref 0.3–1.2)

## 2012-11-23 LAB — CBC WITH DIFFERENTIAL/PLATELET
Basophils Absolute: 0 10*3/uL (ref 0.0–0.1)
Basophils Relative: 0 % (ref 0–1)
MCHC: 33.9 g/dL (ref 30.0–36.0)
Neutro Abs: 5.1 10*3/uL (ref 1.7–7.7)
Neutrophils Relative %: 60 % (ref 43–77)
Platelets: 204 10*3/uL (ref 150–400)
RDW: 13.4 % (ref 11.5–15.5)

## 2012-11-23 LAB — URINALYSIS, ROUTINE W REFLEX MICROSCOPIC
Glucose, UA: NEGATIVE mg/dL
Hgb urine dipstick: NEGATIVE
Protein, ur: NEGATIVE mg/dL
pH: 5.5 (ref 5.0–8.0)

## 2012-11-23 MED ORDER — DILTIAZEM HCL 100 MG IV SOLR
5.0000 mg/h | Freq: Once | INTRAVENOUS | Status: AC
  Administered 2012-11-23: 5 mg/h via INTRAVENOUS

## 2012-11-23 MED ORDER — ADENOSINE 6 MG/2ML IV SOLN
INTRAVENOUS | Status: AC
  Administered 2012-11-23: 6 mg
  Filled 2012-11-23: qty 6

## 2012-11-23 MED ORDER — SODIUM CHLORIDE 0.9 % IV SOLN
1000.0000 mL | Freq: Once | INTRAVENOUS | Status: AC
  Administered 2012-11-23: 1000 mL via INTRAVENOUS

## 2012-11-23 MED ORDER — MORPHINE SULFATE 4 MG/ML IJ SOLN
4.0000 mg | Freq: Once | INTRAMUSCULAR | Status: AC
  Administered 2012-11-23: 4 mg via INTRAVENOUS
  Filled 2012-11-23: qty 1

## 2012-11-23 MED ORDER — SODIUM CHLORIDE 0.9 % IV SOLN
1000.0000 mL | INTRAVENOUS | Status: DC
  Administered 2012-11-24: 1000 mL via INTRAVENOUS

## 2012-11-23 MED ORDER — METOPROLOL TARTRATE 1 MG/ML IV SOLN
INTRAVENOUS | Status: AC
  Filled 2012-11-23: qty 5

## 2012-11-23 MED ORDER — DILTIAZEM HCL 25 MG/5ML IV SOLN
15.0000 mg | Freq: Once | INTRAVENOUS | Status: AC
  Administered 2012-11-23: 15 mg via INTRAVENOUS

## 2012-11-23 MED ORDER — ASPIRIN 325 MG PO TABS
325.0000 mg | ORAL_TABLET | Freq: Once | ORAL | Status: AC
  Administered 2012-11-23: 325 mg via ORAL
  Filled 2012-11-23: qty 1

## 2012-11-23 MED ORDER — ADENOSINE 6 MG/2ML IV SOLN
12.0000 mg | Freq: Once | INTRAVENOUS | Status: AC
  Administered 2012-11-23: 12 mg via INTRAVENOUS

## 2012-11-23 NOTE — ED Notes (Signed)
Pt brought in by EMS from home with reports of generalized weakness and cough for about 1 1/2 weeks. Pt reports 3 days of diarrhea after taking Milk of Magnesia and drinking prune juice. Pt reports prior to this, 7-10 days of constipation. Pt reports being seen for cough on 11/10/12.

## 2012-11-23 NOTE — ED Notes (Signed)
Dr Patria Mane aware that duplicate orders were placed.

## 2012-11-23 NOTE — ED Provider Notes (Addendum)
History     CSN: [id number]  Arrival date & time 11/23/12  1650   First MD Initiated Contact with Patient 11/23/12 2009      Chief Complaint  Patient presents with  . Weakness  . Diarrhea  . Cough    (Consider location/radiation/quality/duration/timing/severity/associated sxs/prior treatment) The history is provided by the patient.   patient reports generalized weakness over the past 7-10 days.  His had decreased oral intake.  He denies nausea vomiting diarrhea.  He denies melena and hematochezia.  No abdominal pain.  No chest pain or shortness of breath.  No palpitations.  No fevers or chills.  He reports that he just feels "run down".  He did have 3 loose stools today after taking milk of magnesia which she was taking for some constipation.  He's also had cough for about a week and half.  He used to smoke cigarettes but stopped smoking cigarettes in the past 2 weeks.  His history of coronary artery disease status post CABG.  Nothing worsens the symptoms.  Nothing improves his symptoms.  Symptoms are mild to moderate in severity.  Past Medical History  Diagnosis Date  . Coronary atherosclerosis of artery bypass graft   . HLD (hyperlipidemia)   . Tobacco use disorder   . Hypertrophy of prostate without urinary obstruction and other lower urinary tract symptoms (LUTS)   . Diabetes mellitus type II ~2008  . Gastrointestinal ulcer 04/2011    with hematemesis s/p EGD by [facility]  . Prostate nodule ~2009    Left-benign s/p eval Uro WNL (Ottelin)  . Chronic obstructive pulmonary disease (COPD)     on CXR  . Barrett's esophagus     EGD 2012, rpt 2014, longterm PPI  . Thrombocytopenia   . HNP (herniated nucleus pulposus), lumbar 2013    with compression of L5 nerve root and foot drop, also with lumbar DDD s/p surgery  . GERD (gastroesophageal reflux disease)     Past Surgical History  Procedure Laterality Date  . Coronary artery bypass graft  1999    Dr. Cornelius Moras  . Hemorrhoid surgery     . Nose surgery    . Rotator cuff repair  04/2010    right (but has bilat) Dr. Yisroel Ramming ([facility])  . Colonoscopy  03/2009    small hemorrhoids, 2 polyps (hyperplastic and adenomatous), rpt 5 yrs (Dr. Maryjane Hurter)  . Esophagogastroduodenoscopy  05/16/2011    after hematemesis, no lesions found, consistent with barrett's esophagus rpt 2 yrs  . Lumbar laminectomy/decompression microdiscectomy  01/31/2012    Procedure: LUMBAR LAMINECTOMY/DECOMPRESSION MICRODISCECTOMY 1 LEVEL;  Surgeon: Carmela Hurt, MD;  Location: [facility];  Service: Neurosurgery;  Laterality: Left;  LEFT Lumbar Four-Five Diskectomy    Family History  Problem Relation Age of Onset  . Cancer Father 74    colon    History  Substance Use Topics  . Smoking status: Current Every Day Smoker -- 1.00 packs/day for 60 years    Types: Cigarettes    Start date: 09/16/1952  . Smokeless tobacco: Current User     Comment: < 1 ppd; does dip sometimes  . Alcohol Use: Yes     Comment: Seldom      Review of Systems  All other systems reviewed and are negative.    Allergies  Codeine; Cyclobenzaprine hcl; Hydrocodone; and Valium  Home Medications   Current Outpatient Rx  Name  Route  Sig  Dispense  Refill  . albuterol (PROVENTIL HFA;VENTOLIN HFA) 108 (90  BASE) MCG/ACT inhaler   Inhalation   Inhale 2 puffs into the lungs every 4 (four) hours as needed. For shortness or breath, wheezing   1 Inhaler   6   . aspirin 81 MG tablet   Oral   Take 81 mg by mouth daily.          . isosorbide mononitrate (IMDUR) 30 MG 24 hr tablet   Oral   Take 0.5 tablets (15 mg total) by mouth daily.   45 tablet   3   . magnesium hydroxide (MILK OF MAGNESIA) 400 MG/5ML suspension   Oral   Take 15 mLs by mouth daily as needed for constipation.   360 mL   0   . metFORMIN (GLUCOPHAGE) 500 MG tablet   Oral   Take 1 tablet (500 mg total) by mouth 2 (two) times daily with a meal.   180 tablet   3   . metoprolol (LOPRESSOR)  25 MG tablet   Oral   Take 1 tablet (25 mg total) by mouth 2 (two) times daily.   180 tablet   3   . nitroGLYCERIN (NITROSTAT) 0.4 MG SL tablet   Sublingual   Place 0.4 mg under the tongue every 5 (five) minutes as needed. For chest pain-may repeat x3         . omeprazole (PRILOSEC) 40 MG capsule   Oral   Take 1 capsule (40 mg total) by mouth daily.   90 capsule   3   . simvastatin (ZOCOR) 40 MG tablet   Oral   Take 1 tablet (40 mg total) by mouth at bedtime.   90 tablet   3   . Ipratropium-Albuterol (COMBIVENT) 20-100 MCG/ACT AERS respimat   Inhalation   Inhale 1 puff into the lungs every 6 (six) hours.   4 g   3   . levofloxacin (LEVAQUIN) 500 MG tablet   Oral   Take 1 tablet (500 mg total) by mouth daily.   7 tablet   0     BP 133/70  Pulse 83  Temp(Src) 97.8 F (36.6 C) (Oral)  Resp 20  Wt 156 lb 3.2 oz (70.852 kg)  BMI 22.26 kg/m2  SpO2 99%  Physical Exam  Nursing note and vitals reviewed. Constitutional: He is oriented to person, place, and time. He appears well-developed and well-nourished.  HENT:  Head: Normocephalic and atraumatic.  Eyes: EOM are normal.  Neck: Normal range of motion.  Cardiovascular: Normal rate, regular rhythm, normal heart sounds and intact distal pulses.   Pulmonary/Chest: Effort normal and breath sounds normal. No respiratory distress.  Abdominal: Soft. He exhibits no distension. There is no tenderness.  Musculoskeletal: Normal range of motion.  Neurological: He is alert and oriented to person, place, and time.  Skin: Skin is warm and dry.  Psychiatric: He has a normal mood and affect. Judgment normal.    ED Course  Procedures (including critical care time)   Date: 11/23/2012  Rate: 81  Rhythm: normal sinus rhythm  QRS Axis: normal  Intervals: normal  ST/T Wave abnormalities: normal  Conduction Disutrbances: none  Narrative Interpretation:   Old EKG Reviewed: No significant changes noted      Date:  11/23/2012  Rate: 166  Rhythm: SVT  QRS Axis: normal  Intervals: normal  ST/T Wave abnormalities: normal  Conduction Disutrbances: none  Narrative Interpretation:   Old EKG Reviewed: changed from prior ecg  CRITICAL CARE Performed by: Lyanne Co Total critical care time: 35 Critical care  time was exclusive of separately billable procedures and treating other patients. Critical care was necessary to treat or prevent imminent or life-threatening deterioration. Critical care was time spent personally by me on the following activities: development of treatment plan with patient and/or surrogate as well as nursing, discussions with consultants, evaluation of patient's response to treatment, examination of patient, obtaining history from patient or surrogate, ordering and performing treatments and interventions, ordering and review of laboratory studies, ordering and review of radiographic studies, pulse oximetry and re-evaluation of patient's condition.    Labs Reviewed  COMPREHENSIVE METABOLIC PANEL - Abnormal; Notable for the following:    Glucose, Bld 123 (*)    Albumin 3.3 (*)    GFR calc non Af Amer 53 (*)    GFR calc Af Amer 61 (*)    All other components within normal limits  URINALYSIS, ROUTINE W REFLEX MICROSCOPIC - Abnormal; Notable for the following:    Specific Gravity, Urine 1.035 (*)    All other components within normal limits  CBC WITH DIFFERENTIAL  TROPONIN I   Dg Chest Port 1 View  11/23/2012  *RADIOLOGY REPORT*  Clinical Data: Weakness, cough, history coronary disease post CABG, diabetes, smoking  PORTABLE CHEST - 1 VIEW  Comparison: Portable exam 2223 hours compared to 10/16/2012  Findings: Normal heart size post CABG. External pacing leads and numerous EKG leads project over chest. Pulmonary vascularity mediastinal contours normal. Lungs appear emphysematous but clear. Mild right apical scarring. No pleural effusion, pneumothorax or acute osseous findings.   IMPRESSION: COPD changes. Post CABG. No acute abnormalities.   Original Report Authenticated By: Ulyses Southward, [name]    I personally reviewed the imaging tests through PACS system I reviewed available ER/hospitalization records through the EMR   1. Atrial fibrillation with controlled ventricular response   2. Weakness       MDM  Patient presented with generalized weakness for the past 7-10 days with some cough.  While in the emergency apartment the patient developed narrow complex tachycardia with a rate up to 166 with inferior lateral ST depression concerning for ischemic changes.  He was having some chest discomfort at the time.  He was given adenosine and appeared to break into a neutral fibrillation.  There is questionable flutter waves at one point.  The patient was then started on a Cardizem drip and is now chemically converted back into sinus rhythm after bolus of Cardizem and a Cardizem drip at 10 mg an hour.  The patient is no longer having chest pain.  No indication for heparinization at this time.  Patient's been given an aspirin.  He has no history coronary disease.  His last catheter in 2006 demonstrated patent grafts.  Initial troponin is negative.  The patient be admitted to hospitalist service.  He does have a cardiologist.  [facility] to consult       Lyanne Co, MD 11/23/12 1610  Lyanne Co, MD 11/23/12 [phone]

## 2012-11-23 NOTE — H&P (Signed)
History and Physical  Troy [name] ZOX:[id number] DOB: 03-29-1933 DOA: 11/23/2012  Referring physician: Azalia Bilis, MD PCP: Eustaquio Boyden, MD  Cardiologist: Dr. Daleen Squibb  Chief Complaint: Feels weak  HPI:  79 year old man came to the emergency department with complaint of generalized weakness, malaise, poor eating habits. Recently treated for COPD exacerbation, still on antibiotics. Initial evaluation in the emergency department was unremarkable, however patient developed narrow complex tachycardia with some chest pain and was referred for admission.  Treated for COPD exacerbation 2/25 with antibiotics and prednisone. Failed to improve and was started on Levaquin 6 days prior to admission. Cough and shortness of breath have resolved the patient begins to feel generally weak with malaise and poor eating habits. Because of persistent generalized weakness his wife insisted he come to the hospital. He said no focal weakness or focal symptoms   While in the emergency department he developed narrow complex tachycardia associated with some chest pain treated as below.  In ED  Afebrile, HR up to 160s, now 80s.   Screening labs unremarkable  EKG--SR, no acute changes; developed a narrow complex tachycardia 160s. Second EKG SVT.  ED PG of adenosine and reported flutter waves suggestive of atrial flutter. Start on IV diltiazem infusion with conversion to sinus rhythm.  CXR--COPD  Chart Review:  11/10/12 OV--COPD exacerbation. Treat with steroid course, doxycycline, and duonebs.  3/5 telephone call, started on Levaquin  Review of Systems:  Negative for fever, visual changes, sore throat, rash, new muscle aches, SOB, dysuria, bleeding, n/v/abdominal pain.  Positive for poor oral intake.  Past Medical History  Diagnosis Date  . Coronary atherosclerosis of artery bypass graft   . HLD (hyperlipidemia)   . Tobacco use disorder   . Hypertrophy of prostate without urinary obstruction and  other lower urinary tract symptoms (LUTS)   . Diabetes mellitus type II ~2008  . Gastrointestinal ulcer 04/2011    with hematemesis s/p EGD by [facility]  . Prostate nodule ~2009    Left-benign s/p eval Uro WNL (Ottelin)  . Chronic obstructive pulmonary disease (COPD)     on CXR  . Barrett's esophagus     EGD 2012, rpt 2014, longterm PPI  . Thrombocytopenia   . HNP (herniated nucleus pulposus), lumbar 2013    with compression of L5 nerve root and foot drop, also with lumbar DDD s/p surgery  . GERD (gastroesophageal reflux disease)     Past Surgical History  Procedure Laterality Date  . Coronary artery bypass graft  1999    Dr. Cornelius Moras  . Hemorrhoid surgery    . Nose surgery    . Rotator cuff repair  04/2010    right (but has bilat) Dr. Yisroel Ramming ([facility])  . Colonoscopy  03/2009    small hemorrhoids, 2 polyps (hyperplastic and adenomatous), rpt 5 yrs (Dr. Maryjane Hurter)  . Esophagogastroduodenoscopy  05/16/2011    after hematemesis, no lesions found, consistent with barrett's esophagus rpt 2 yrs  . Lumbar laminectomy/decompression microdiscectomy  01/31/2012    Procedure: LUMBAR LAMINECTOMY/DECOMPRESSION MICRODISCECTOMY 1 LEVEL;  Surgeon: Carmela Hurt, MD;  Location: [facility];  Service: Neurosurgery;  Laterality: Left;  LEFT Lumbar Four-Five Diskectomy    Social History:  reports that he has been smoking Cigarettes.  He started smoking about 60 years ago. He has a 60 pack-year smoking history. He uses smokeless tobacco. He reports that  drinks alcohol. He reports that he does not use illicit drugs.  Allergies  Allergen Reactions  . Codeine  REACTION: vomiting  . Cyclobenzaprine Hcl     REACTION: vomiting  . Hydrocodone Nausea Only and Other (See Comments)    "crazy"  . Valium Other (See Comments)    sedation    Family History  Problem Relation Age of Onset  . Cancer Father 53    colon     Prior to Admission medications   Medication Sig Start Date End Date Taking?  Authorizing Provider  albuterol (PROVENTIL HFA;VENTOLIN HFA) 108 (90 BASE) MCG/ACT inhaler Inhale 2 puffs into the lungs every 4 (four) hours as needed. For shortness or breath, wheezing 08/17/12  Yes Eustaquio Boyden, MD  aspirin 81 MG tablet Take 81 mg by mouth daily.    Yes Historical Provider, MD  isosorbide mononitrate (IMDUR) 30 MG 24 hr tablet Take 0.5 tablets (15 mg total) by mouth daily. 04/29/12  Yes Eustaquio Boyden, MD  magnesium hydroxide (MILK OF MAGNESIA) 400 MG/5ML suspension Take 15 mLs by mouth daily as needed for constipation. 10/09/12  Yes Eustaquio Boyden, MD  metFORMIN (GLUCOPHAGE) 500 MG tablet Take 1 tablet (500 mg total) by mouth 2 (two) times daily with a meal. 04/29/12  Yes Eustaquio Boyden, MD  metoprolol (LOPRESSOR) 25 MG tablet Take 1 tablet (25 mg total) by mouth 2 (two) times daily. 04/29/12  Yes Eustaquio Boyden, MD  nitroGLYCERIN (NITROSTAT) 0.4 MG SL tablet Place 0.4 mg under the tongue every 5 (five) minutes as needed. For chest pain-may repeat x3   Yes Historical Provider, MD  omeprazole (PRILOSEC) 40 MG capsule Take 1 capsule (40 mg total) by mouth daily. 04/29/12 04/29/13 Yes Eustaquio Boyden, MD  simvastatin (ZOCOR) 40 MG tablet Take 1 tablet (40 mg total) by mouth at bedtime. 04/29/12  Yes Eustaquio Boyden, MD  Ipratropium-Albuterol (COMBIVENT) 20-100 MCG/ACT AERS respimat Inhale 1 puff into the lungs every 6 (six) hours. 11/10/12   Eustaquio Boyden, MD  levofloxacin (LEVAQUIN) 500 MG tablet Take 1 tablet (500 mg total) by mouth daily. 11/18/12   Eustaquio Boyden, MD   Physical Exam: Filed Vitals:   11/23/12 2224 11/23/12 2230 11/23/12 2245 11/23/12 2300  BP:  149/68 139/67 133/70  Pulse: 90 85 85 83  Temp:      TempSrc:      Resp: 23 19 20 20   Weight:      SpO2: 98% 98% 99% 99%    General: Examined in the emergency department. Appears calm and comfortable. Eyes: PERRL, normal lids, irises  ENT: grossly normal hearing, lips & tongue Neck: no LAD, masses or  thyromegaly Cardiovascular: RRR, no m/r/g. No LE edema. Telemetry: SR, rate 60s Respiratory: CTA bilaterally, no w/r/r. Normal respiratory effort. Abdomen: soft, ntnd Skin: no rash or induration seen on limited exam Musculoskeletal: grossly normal tone BUE/BLE Psychiatric: grossly normal mood and affect, speech fluent and appropriate Neurologic: grossly non-focal.  Wt Readings from Last 3 Encounters:  11/23/12 70.852 kg (156 lb 3.2 oz)  11/10/12 76.091 kg (167 lb 12 oz)  10/26/12 76.771 kg (169 lb 4 oz)    Labs on Admission:  Basic Metabolic Panel:  Recent Labs Lab 11/23/12 1822  NA 136  K 4.8  CL 100  CO2 26  GLUCOSE 123*  BUN 20  CREATININE 1.27  CALCIUM 9.6    Liver Function Tests:  Recent Labs Lab 11/23/12 1822  AST 19  ALT 22  ALKPHOS 116  BILITOT 0.3  PROT 8.3  ALBUMIN 3.3*   CBC:  Recent Labs Lab 11/23/12 1822  WBC 8.5  NEUTROABS 5.1  HGB 14.1  HCT 41.6  MCV 89.5  PLT 204    Cardiac Enzymes:  Recent Labs Lab 11/23/12 2031  TROPONINI <0.30    Radiological Exams on Admission: Dg Chest Port 1 View  11/23/2012  *RADIOLOGY REPORT*  Clinical Data: Weakness, cough, history coronary disease post CABG, diabetes, smoking  PORTABLE CHEST - 1 VIEW  Comparison: Portable exam 2223 hours compared to 10/16/2012  Findings: Normal heart size post CABG. External pacing leads and numerous EKG leads project over chest. Pulmonary vascularity mediastinal contours normal. Lungs appear emphysematous but clear. Mild right apical scarring. No pleural effusion, pneumothorax or acute osseous findings.  IMPRESSION: COPD changes. Post CABG. No acute abnormalities.   Original Report Authenticated By: Ulyses Southward, [name]     EKG: Independently reviewed. As above   Active Problems:   DIABETES MELLITUS, TYPE II   COPD, mild   Generalized weakness   Supraventricular tachycardia   History of coronary artery disease   Assessment/Plan 1. Generalized weakness: Likely  secondary to recent prolonged illness, COPD exacerbation. No focal symptoms. Screening evaluation unremarkable with negative chest x-ray and urine. Normal white blood cell count. Physical therapy evaluation. 2. Recent COPD exacerbation: Appears resolved. Continue Combivent, albuterol as needed and recently prescribed Levaquin. 3. Narrow complex tachycardia with associated chest pain: EKG consistent with SVT, per emergency department physician after adenosine there were flutter waves on telemetry, questionable atrial flutter. Cardiology consult pending. Currently in sinus rhythm on diltiazem infusion. We will transition off and resume metoprolol. Suspect pain secondary to rapid rate rather than ACS. Serial cardiac enzymes 4. Diabetes mellitus type 2: Hold metformin. Sliding scale insulin. 5. History coronary artery disease: Appears stable. Plan as above.  Code Status: Full code  Family Communication: Discussed with wife at bedside  Disposition Plan/Anticipated LOS: Observation 24-hour  Time spent: 55  minutes  Brendia Sacks, MD  [facility] Pager [phone] 11/23/2012, 11:37 PM

## 2012-11-24 DIAGNOSIS — R531 Weakness: Secondary | ICD-10-CM

## 2012-11-24 DIAGNOSIS — I471 Supraventricular tachycardia: Secondary | ICD-10-CM

## 2012-11-24 DIAGNOSIS — K5900 Constipation, unspecified: Secondary | ICD-10-CM

## 2012-11-24 DIAGNOSIS — J209 Acute bronchitis, unspecified: Secondary | ICD-10-CM

## 2012-11-24 DIAGNOSIS — I4891 Unspecified atrial fibrillation: Secondary | ICD-10-CM

## 2012-11-24 DIAGNOSIS — K2270 Barrett's esophagus without dysplasia: Secondary | ICD-10-CM

## 2012-11-24 DIAGNOSIS — I2510 Atherosclerotic heart disease of native coronary artery without angina pectoris: Secondary | ICD-10-CM

## 2012-11-24 LAB — CBC
MCH: 30 pg (ref 26.0–34.0)
MCV: 88.3 fL (ref 78.0–100.0)
Platelets: 163 10*3/uL (ref 150–400)
RDW: 13.6 % (ref 11.5–15.5)
WBC: 7.5 10*3/uL (ref 4.0–10.5)

## 2012-11-24 LAB — BASIC METABOLIC PANEL
CO2: 23 mEq/L (ref 19–32)
Calcium: 8.2 mg/dL — ABNORMAL LOW (ref 8.4–10.5)
Creatinine, Ser: 1.01 mg/dL (ref 0.50–1.35)
Glucose, Bld: 141 mg/dL — ABNORMAL HIGH (ref 70–99)

## 2012-11-24 LAB — CREATININE, SERUM: GFR calc Af Amer: 82 mL/min — ABNORMAL LOW (ref >90)

## 2012-11-24 LAB — GLUCOSE, CAPILLARY

## 2012-11-24 MED ORDER — ENOXAPARIN SODIUM 40 MG/0.4ML ~~LOC~~ SOLN
40.0000 mg | SUBCUTANEOUS | Status: DC
  Filled 2012-11-24: qty 0.4

## 2012-11-24 MED ORDER — POLYETHYLENE GLYCOL 3350 17 G PO PACK
17.0000 g | PACK | Freq: Every day | ORAL | Status: DC | PRN
  Filled 2012-11-24: qty 1

## 2012-11-24 MED ORDER — PANTOPRAZOLE SODIUM 40 MG PO TBEC
40.0000 mg | DELAYED_RELEASE_TABLET | Freq: Every day | ORAL | Status: DC
  Filled 2012-11-24: qty 1

## 2012-11-24 MED ORDER — ATORVASTATIN CALCIUM 40 MG PO TABS
40.0000 mg | ORAL_TABLET | Freq: Every day | ORAL | Status: DC
  Filled 2012-11-24: qty 1

## 2012-11-24 MED ORDER — ISOSORBIDE MONONITRATE 15 MG HALF TABLET
15.0000 mg | ORAL_TABLET | Freq: Every day | ORAL | Status: DC
  Filled 2012-11-24: qty 1

## 2012-11-24 MED ORDER — ACETAMINOPHEN 325 MG PO TABS: 650.0000 mg | ORAL_TABLET | Freq: Four times a day (QID) | ORAL | Status: DC | PRN

## 2012-11-24 MED ORDER — ONDANSETRON HCL 4 MG/2ML IJ SOLN: 4.0000 mg | Freq: Four times a day (QID) | INTRAMUSCULAR | Status: DC | PRN

## 2012-11-24 MED ORDER — ONDANSETRON HCL 4 MG PO TABS: 4.0000 mg | ORAL_TABLET | Freq: Four times a day (QID) | ORAL | Status: DC | PRN

## 2012-11-24 MED ORDER — ACETAMINOPHEN 650 MG RE SUPP: 650.0000 mg | Freq: Four times a day (QID) | RECTAL | Status: DC | PRN

## 2012-11-24 MED ORDER — METOPROLOL TARTRATE 25 MG PO TABS
25.0000 mg | ORAL_TABLET | Freq: Two times a day (BID) | ORAL | Status: DC
  Administered 2012-11-24: 25 mg via ORAL
  Filled 2012-11-24 (×3): qty 1

## 2012-11-24 MED ORDER — NITROGLYCERIN 0.4 MG SL SUBL: 0.4000 mg | SUBLINGUAL_TABLET | SUBLINGUAL | Status: DC | PRN

## 2012-11-24 MED ORDER — MAGNESIUM HYDROXIDE 400 MG/5ML PO SUSP: 15.0000 mL | Freq: Every day | ORAL | Status: DC | PRN

## 2012-11-24 MED ORDER — INSULIN ASPART 100 UNIT/ML ~~LOC~~ SOLN: 0.0000 [IU] | Freq: Three times a day (TID) | SUBCUTANEOUS | Status: DC

## 2012-11-24 MED ORDER — IPRATROPIUM-ALBUTEROL 20-100 MCG/ACT IN AERS
1.0000 | INHALATION_SPRAY | Freq: Four times a day (QID) | RESPIRATORY_TRACT | Status: DC
  Administered 2012-11-24: 1 via RESPIRATORY_TRACT
  Filled 2012-11-24: qty 4

## 2012-11-24 MED ORDER — LEVOFLOXACIN 500 MG PO TABS
500.0000 mg | ORAL_TABLET | Freq: Every day | ORAL | Status: DC
  Filled 2012-11-24: qty 1

## 2012-11-24 MED ORDER — SODIUM CHLORIDE 0.9 % IJ SOLN: 3.0000 mL | Freq: Two times a day (BID) | INTRAMUSCULAR | Status: DC

## 2012-11-24 MED ORDER — ALBUTEROL SULFATE HFA 108 (90 BASE) MCG/ACT IN AERS
2.0000 | INHALATION_SPRAY | RESPIRATORY_TRACT | Status: DC | PRN
  Filled 2012-11-24: qty 6.7

## 2012-11-24 MED ORDER — ASPIRIN EC 81 MG PO TBEC
81.0000 mg | DELAYED_RELEASE_TABLET | Freq: Every day | ORAL | Status: DC
  Filled 2012-11-24: qty 1

## 2012-11-24 NOTE — ED Notes (Signed)
Floor Unit RN unavailable to take report on pt at this time. 

## 2012-11-24 NOTE — Progress Notes (Signed)
    Subjective:  The patient feels better this morning. He wants to leave the hospital. I discussed the fact that an echocardiogram has been ordered, but he does not want anymore testing done.  Objective:  Vital Signs in the last 24 hours: Temp:  [97.4 F (36.3 C)-97.9 F (36.6 C)] 97.9 F (36.6 C) (03/11 0610) Pulse Rate:  [60-168] 60 (03/11 0610) Resp:  [15-27] 18 (03/11 0610) BP: (105-155)/(54-134) 117/65 mmHg (03/11 0610) SpO2:  [95 %-100 %] 99 % (03/11 0610) Weight:  [70.852 kg (156 lb 3.2 oz)-70.9 kg (156 lb 4.9 oz)] 70.9 kg (156 lb 4.9 oz) (03/11 0208)  Intake/Output from previous day: 03/10 0701 - 03/11 0700 In: -  Out: 525 [Urine:525]  Physical Exam: Pt is alert and oriented, NAD HEENT: normal Neck: JVP - normal, carotids 2+= without bruits Lungs: CTA bilaterally CV: RRR without murmur or gallop Abd: soft, NT, Positive BS, no hepatomegaly Ext: no C/C/E, distal pulses intact and equal Skin: warm/dry no rash  Lab Results:  Recent Labs  11/23/12 1822 11/24/12 0247  WBC 8.5 7.5  HGB 14.1 12.3*  PLT 204 163    Recent Labs  11/23/12 1822 11/24/12 0247  NA 136 138  K 4.8 4.2  CL 100 105  CO2 26 23  GLUCOSE 123* 141*  BUN 20 15  CREATININE 1.27 1.00  1.01    Recent Labs  11/23/12 2031 11/24/12 0247  TROPONINI <0.30 <0.30    Cardiac Studies: None  Tele: Sinus rhythm without significant arrhythmia, personally reviewed.  Assessment/Plan:  1. PSVT. This was probably an incidental finding. He is on telemetry and is maintaining normal sinus rhythm. Recommend continuation of metoprolol 25 mg twice daily. An echocardiogram has been ordered. I don't feel strongly that it has to be completed while he is in the hospital, especially if he leaves early this morning. We can arrange this at the time of his followup visit.  2. CAD status post CABG. No ischemic symptoms. Cardiac markers negative.  3. Disposition: Per the primary service. I will arrange  followup with Dr. wall or his PA/NP in the office within a few weeks. He has very nonspecific symptoms and I don't suspect an acute cardiac process.  Tonny Bollman, [name] 11/24/2012, 8:08 AM

## 2012-11-24 NOTE — Consult Note (Signed)
Cardiology IP Consult  Reason for Consult:SVT Referring Physician: ED  HPI: Troy [name] is a 79 y.o.male with hx below relevant for chronic ischemic heart disease (followed by Dr. Daleen Squibb), ongoing tobacco abuse, DM, COPD who presented to the ED with lethargy, fatigue for 2 weeks. Progressive. While in the ED, he was noted to have a paroxsymal episode of SVT. This broke with adenosine and post chemical cardioversion he was noted to be in atrial fibrillation. This has now converted to NSR. He denies any current or recent angina (excluding during tonight's episode of SVT). He denies orthopnea, PND, LE edema.    Past Medical History  Diagnosis Date  . Coronary atherosclerosis of artery bypass graft   . HLD (hyperlipidemia)   . Tobacco use disorder   . Hypertrophy of prostate without urinary obstruction and other lower urinary tract symptoms (LUTS)   . Diabetes mellitus type II ~2008  . Gastrointestinal ulcer 04/2011    with hematemesis s/p EGD by [facility]  . Prostate nodule ~2009    Left-benign s/p eval Uro WNL (Ottelin)  . Chronic obstructive pulmonary disease (COPD)     on CXR  . Barrett's esophagus     EGD 2012, rpt 2014, longterm PPI  . Thrombocytopenia   . HNP (herniated nucleus pulposus), lumbar 2013    with compression of L5 nerve root and foot drop, also with lumbar DDD s/p surgery  . GERD (gastroesophageal reflux disease)     Past Surgical History  Procedure Laterality Date  . Coronary artery bypass graft  1999    Dr. Cornelius Moras  . Hemorrhoid surgery    . Nose surgery    . Rotator cuff repair  04/2010    right (but has bilat) Dr. Yisroel Ramming ([facility])  . Colonoscopy  03/2009    small hemorrhoids, 2 polyps (hyperplastic and adenomatous), rpt 5 yrs (Dr. Maryjane Hurter)  . Esophagogastroduodenoscopy  05/16/2011    after hematemesis, no lesions found, consistent with barrett's esophagus rpt 2 yrs  . Lumbar laminectomy/decompression microdiscectomy  01/31/2012    Procedure: LUMBAR  LAMINECTOMY/DECOMPRESSION MICRODISCECTOMY 1 LEVEL;  Surgeon: Carmela Hurt, MD;  Location: [facility];  Service: Neurosurgery;  Laterality: Left;  LEFT Lumbar Four-Five Diskectomy    Family History  Problem Relation Age of Onset  . Cancer Father 13    colon    Social History:  reports that he has been smoking Cigarettes.  He started smoking about 60 years ago. He has a 60 pack-year smoking history. He uses smokeless tobacco. He reports that  drinks alcohol. He reports that he does not use illicit drugs.  Allergies:  Allergies  Allergen Reactions  . Codeine     REACTION: vomiting  . Cyclobenzaprine Hcl     REACTION: vomiting  . Hydrocodone Nausea Only and Other (See Comments)    "crazy"  . Valium Other (See Comments)    sedation    Current Facility-Administered Medications  Medication Dose Route Frequency Provider Last Rate Last Dose  . 0.9 %  sodium chloride infusion  1,000 mL Intravenous Continuous Lyanne Co, MD       Current Outpatient Prescriptions  Medication Sig Dispense Refill  . albuterol (PROVENTIL HFA;VENTOLIN HFA) 108 (90 BASE) MCG/ACT inhaler Inhale 2 puffs into the lungs every 4 (four) hours as needed. For shortness or breath, wheezing  1 Inhaler  6  . aspirin 81 MG tablet Take 81 mg by mouth daily.       . isosorbide mononitrate (IMDUR) 30 MG  24 hr tablet Take 0.5 tablets (15 mg total) by mouth daily.  45 tablet  3  . magnesium hydroxide (MILK OF MAGNESIA) 400 MG/5ML suspension Take 15 mLs by mouth daily as needed for constipation.  360 mL  0  . metFORMIN (GLUCOPHAGE) 500 MG tablet Take 1 tablet (500 mg total) by mouth 2 (two) times daily with a meal.  180 tablet  3  . metoprolol (LOPRESSOR) 25 MG tablet Take 1 tablet (25 mg total) by mouth 2 (two) times daily.  180 tablet  3  . nitroGLYCERIN (NITROSTAT) 0.4 MG SL tablet Place 0.4 mg under the tongue every 5 (five) minutes as needed. For chest pain-may repeat x3      . omeprazole (PRILOSEC) 40 MG capsule Take  1 capsule (40 mg total) by mouth daily.  90 capsule  3  . simvastatin (ZOCOR) 40 MG tablet Take 1 tablet (40 mg total) by mouth at bedtime.  90 tablet  3  . Ipratropium-Albuterol (COMBIVENT) 20-100 MCG/ACT AERS respimat Inhale 1 puff into the lungs every 6 (six) hours.  4 g  3  . levofloxacin (LEVAQUIN) 500 MG tablet Take 1 tablet (500 mg total) by mouth daily.  7 tablet  0    ROS: A full review of systems is obtained and is negative except as noted in the HPI.  Physical Exam: Blood pressure 137/67, pulse 73, temperature 97.8 F (36.6 C), temperature source Oral, resp. rate 18, weight 70.852 kg (156 lb 3.2 oz), SpO2 98.00%.  GENERAL: no acute distress.  EYES: Extra ocular movements are intact. There is no lid lag. Sclera is anicteric.  ENT: Oropharynx is clear. Dentition is within normal limits.  NECK: Supple. The thyroid is not enlarged.  LYMPH: There are no masses or lymphadenopathy present.  HEART: Regular rate and rhythm with no m/g/r.  Normal S1/S2. No JVD LUNGS: Poor air movement. Rhonchorous breath sounds noted throughout but no frank wheezing or rales.   ABDOMEN: Soft, non-tender, and non-distended with normoactive bowel sounds. There is no hepatosplenomegaly.  EXTREMITIES: No clubbing, cyanosis, or edema.  PULSES: Carotids were +2 and equal bilaterally with no bruits.  SKIN: Warm, dry, and intact.  NEUROLOGIC: The patient was oriented to person, place, and time. No overt neurologic deficits were detected.  PSYCH: Normal judgment and insight, mood is appropriate.    Results: Results for orders placed during the hospital encounter of 11/23/12 (from the past 24 hour(s))  CBC WITH DIFFERENTIAL     Status: None   Collection Time    11/23/12  6:22 PM      Result Value Range   WBC 8.5  4.0 - 10.5 K/uL   RBC 4.65  4.22 - 5.81 MIL/uL   Hemoglobin 14.1  13.0 - 17.0 g/dL   HCT 16.1  09.6 - 04.5 %   MCV 89.5  78.0 - 100.0 fL   MCH 30.3  26.0 - 34.0 pg   MCHC 33.9  30.0 - 36.0 g/dL    RDW 40.9  81.1 - 91.4 %   Platelets 204  150 - 400 K/uL   Neutrophils Relative 60  43 - 77 %   Neutro Abs 5.1  1.7 - 7.7 K/uL   Lymphocytes Relative 33  12 - 46 %   Lymphs Abs 2.8  0.7 - 4.0 K/uL   Monocytes Relative 6  3 - 12 %   Monocytes Absolute 0.5  0.1 - 1.0 K/uL   Eosinophils Relative 1  0 - 5 %  Eosinophils Absolute 0.0  0.0 - 0.7 K/uL   Basophils Relative 0  0 - 1 %   Basophils Absolute 0.0  0.0 - 0.1 K/uL  COMPREHENSIVE METABOLIC PANEL     Status: Abnormal   Collection Time    11/23/12  6:22 PM      Result Value Range   Sodium 136  135 - 145 mEq/L   Potassium 4.8  3.5 - 5.1 mEq/L   Chloride 100  96 - 112 mEq/L   CO2 26  19 - 32 mEq/L   Glucose, Bld 123 (*) 70 - 99 mg/dL   BUN 20  6 - 23 mg/dL   Creatinine, Ser 6.44  0.50 - 1.35 mg/dL   Calcium 9.6  8.4 - 03.4 mg/dL   Total Protein 8.3  6.0 - 8.3 g/dL   Albumin 3.3 (*) 3.5 - 5.2 g/dL   AST 19  0 - 37 U/L   ALT 22  0 - 53 U/L   Alkaline Phosphatase 116  39 - 117 U/L   Total Bilirubin 0.3  0.3 - 1.2 mg/dL   GFR calc non Af Amer 53 (*) >90 mL/min   GFR calc Af Amer 61 (*) >90 mL/min  URINALYSIS, ROUTINE W REFLEX MICROSCOPIC     Status: Abnormal   Collection Time    11/23/12  6:37 PM      Result Value Range   Color, Urine YELLOW  YELLOW   APPearance CLEAR  CLEAR   Specific Gravity, Urine 1.035 (*) 1.005 - 1.030   pH 5.5  5.0 - 8.0   Glucose, UA NEGATIVE  NEGATIVE mg/dL   Hgb urine dipstick NEGATIVE  NEGATIVE   Bilirubin Urine NEGATIVE  NEGATIVE   Ketones, ur NEGATIVE  NEGATIVE mg/dL   Protein, ur NEGATIVE  NEGATIVE mg/dL   Urobilinogen, UA 0.2  0.0 - 1.0 mg/dL   Nitrite NEGATIVE  NEGATIVE   Leukocytes, UA NEGATIVE  NEGATIVE  TROPONIN I     Status: None   Collection Time    11/23/12  8:31 PM      Result Value Range   Troponin I <0.30  <0.30 ng/mL    CXR: COPD changes.  Post CABG.  No acute abnormalities  EKG: SVT, rate 166. Likely AVNRT.   Assessment/Recs: 79 y/o male with chronic ischemic heart  disease now with a brief episode of SVT in the setting of progressive lethargy, fatigue. Suspect this arrhythmia was secondary to some other underlying (non-cardiac) process that is driving his symptoms. He denies any other active CV sx currently and his CV exam is unremarkable.   - Would continue to pursue underlying driver of symptoms.   - Would place on telemetry while in-house.   - Increase metoprolol to 25 mg BID.   - Check echo in AM.   - Will follow with you.   Glayds Insco 11/24/2012, 12:37 AM

## 2012-11-24 NOTE — Progress Notes (Signed)
Cardizem drip was stopped once patient came to the floor.  Order for only one dose; verified with NP on call.  Gave Metoprolol per orders.  Patient's heart rate has been ranging from 45-60's while on the floor.  Patient has no complaints, will continue to monitor patient.

## 2012-11-24 NOTE — Discharge Summary (Signed)
Physician Discharge Summary  Troy [name]:[id number] DOB: July 04, 1933 DOA: 11/23/2012  PCP: Eustaquio Boyden, MD  Admit date: 11/23/2012 Discharge date: 11/24/2012  Recommendations for Outpatient Follow-up:  1. Pt will need to follow up with PCP in 2-3 weeks post discharge 2. Please obtain BMP to evaluate electrolytes and kidney function 3. Please also check CBC to evaluate Hg and Hct levels 4. Will need to follow up with his cardiologist in 1 week, pt verbalized understanding   Discharge Diagnoses: SVT, resolved to sinus rhythm  Active Problems:   DIABETES MELLITUS, TYPE II   COPD, mild   Generalized weakness   Supraventricular tachycardia   History of coronary artery disease  Discharge Condition: Stable  Diet recommendation: Heart healthy diet discussed in details   History of present illness:  Pt is 79 y.o.male with hx relevant for chronic ischemic heart disease (followed by Dr. Daleen Squibb), ongoing tobacco abuse, DM, COPD who presented to the ED with lethargy, fatigue for 2 weeks. Progressive. While in the ED, he was noted to have a paroxsymal episode of SVT. This broke with adenosine and post chemical cardioversion he was noted to be in atrial fibrillation. He has now converted to NSR. He denies any current or recent angina (excluding during tonight's episode of SVT). He denies orthopnea, PND, LE edema.   Hospital Course:  Active Problems:   SVT - now back in sinus rhythm - continue Metoprolol as recommended by cardiologist - pt verbalized understanding  - no events on tele, CE x 2 sets negative    DIABETES MELLITUS, TYPE II - stable inpatient CBG's   COPD, mild - pt maintaining oxygen saturations at target range    History of coronary artery disease - clinically stable at this time  Procedures/Studies: Dg Chest Port 1 View 11/23/2012 --> COPD changes. Post CABG. No acute abnormalities.    Consultations:  Cardiology  Antibiotics:  None  Discharge Exam: Filed  Vitals:   11/24/12 0610  BP: 117/65  Pulse: 60  Temp: 97.9 F (36.6 C)  Resp: 18   Filed Vitals:   11/24/12 0208 11/24/12 0218 11/24/12 0320 11/24/12 0610  BP: 144/59 121/54  117/65  Pulse: 65   60  Temp: 97.4 F (36.3 C)   97.9 F (36.6 C)  TempSrc: Oral   Oral  Resp: 18  18 18   Height: 5' 10.5" (1.791 m)     Weight: 156 lb 4.9 oz (70.9 kg)     SpO2: 98%   99%    General: Pt is alert, follows commands appropriately, not in acute distress Cardiovascular: Regular rate and rhythm, S1/S2 +, no murmurs, no rubs, no gallops Respiratory: Clear to auscultation bilaterally, no wheezing, no crackles, no rhonchi Abdominal: Soft, non tender, non distended, bowel sounds +, no guarding Extremities: no edema, no cyanosis, pulses palpable bilaterally DP and PT Neuro: Grossly nonfocal  Discharge Instructions  Discharge Orders   Future Orders Complete By Expires     Diet - low sodium heart healthy  As directed     Increase activity slowly  As directed         Medication List    TAKE these medications       albuterol 108 (90 BASE) MCG/ACT inhaler  Commonly known as:  PROVENTIL HFA;VENTOLIN HFA  Inhale 2 puffs into the lungs every 4 (four) hours as needed. For shortness or breath, wheezing     aspirin 81 MG tablet  Take 81 mg by mouth daily.     Ipratropium-Albuterol  20-100 MCG/ACT Aers respimat  Commonly known as:  COMBIVENT  Inhale 1 puff into the lungs every 6 (six) hours.     isosorbide mononitrate 30 MG 24 hr tablet  Commonly known as:  IMDUR  Take 0.5 tablets (15 mg total) by mouth daily.     levofloxacin 500 MG tablet  Commonly known as:  LEVAQUIN  Take 1 tablet (500 mg total) by mouth daily.     magnesium hydroxide 400 MG/5ML suspension  Commonly known as:  MILK OF MAGNESIA  Take 15 mLs by mouth daily as needed for constipation.     metFORMIN 500 MG tablet  Commonly known as:  GLUCOPHAGE  Take 1 tablet (500 mg total) by mouth 2 (two) times daily with a meal.      metoprolol tartrate 25 MG tablet  Commonly known as:  LOPRESSOR  Take 1 tablet (25 mg total) by mouth 2 (two) times daily.     nitroGLYCERIN 0.4 MG SL tablet  Commonly known as:  NITROSTAT  Place 0.4 mg under the tongue every 5 (five) minutes as needed. For chest pain-may repeat x3     omeprazole 40 MG capsule  Commonly known as:  PRILOSEC  Take 1 capsule (40 mg total) by mouth daily.     simvastatin 40 MG tablet  Commonly known as:  ZOCOR  Take 1 tablet (40 mg total) by mouth at bedtime.           Follow-up Information   Follow up with Eustaquio Boyden, MD In 2 weeks.   Contact information:   [address] [place] [address] [phone]       Follow up with Valera Castle, MD. (they will call you for an appointment time and date)    Contact information:   [address] [place] [address] [phone]        The results of significant diagnostics from this hospitalization (including imaging, microbiology, ancillary and laboratory) are listed below for reference.     Microbiology: No results found for this or any previous visit (from the past 240 hour(s)).   Labs: Basic Metabolic Panel:  Recent Labs Lab 11/23/12 1822 11/24/12 0247  NA 136 138  K 4.8 4.2  CL 100 105  CO2 26 23  GLUCOSE 123* 141*  BUN 20 15  CREATININE 1.27 1.00  1.01  CALCIUM 9.6 8.2*   Liver Function Tests:  Recent Labs Lab 11/23/12 1822  AST 19  ALT 22  ALKPHOS 116  BILITOT 0.3  PROT 8.3  ALBUMIN 3.3*   No results found for this basename: LIPASE, AMYLASE,  in the last 168 hours No results found for this basename: AMMONIA,  in the last 168 hours CBC:  Recent Labs Lab 11/23/12 1822 11/24/12 0247  WBC 8.5 7.5  NEUTROABS 5.1  --   HGB 14.1 12.3*  HCT 41.6 36.2*  MCV 89.5 88.3  PLT 204 163   Cardiac Enzymes:  Recent Labs Lab 11/23/12 2031 11/24/12 0247 11/24/12 0744  TROPONINI <0.30 <0.30 <0.30   BNP: BNP (last 3 results) No  results found for this basename: PROBNP,  in the last 8760 hours CBG:  Recent Labs Lab 11/23/12 2025 11/24/12 0215 11/24/12 0733  GLUCAP 113* 146* 115*     SIGNED: Time coordinating discharge: Over 30 minutes  Debbora Presto, MD  [facility] 11/24/2012, 9:29 AM Pager [phone]  If 7PM-7AM, please contact night-coverage [email or web address] Password [email or web address]

## 2012-11-24 NOTE — Care Management Note (Signed)
    Page 1 of 1   11/24/2012     1:03:33 PM   CARE MANAGEMENT NOTE 11/24/2012  Patient:  Troy [name], Troy [name]   Account Number:  [id number]  Date Initiated:  11/24/2012  Documentation initiated by:  Lanier Clam  Subjective/Objective Assessment:   ADMITTED W/WEAKNESS.COPD,SVT.ZH:YQMV.     Action/Plan:   FROM HOME   Anticipated DC Date:  11/24/2012   Anticipated DC Plan:  HOME/SELF CARE      DC Planning Services  CM consult      Choice offered to / List presented to:             Status of service:  Completed, signed off Medicare Important Message given?   (If response is "NO", the following Medicare IM given date fields will be blank) Date Medicare IM given:   Date Additional Medicare IM given:    Discharge Disposition:  HOME/SELF CARE  Per UR Regulation:  Reviewed for med. necessity/level of care/duration of stay  If discussed at Long Length of Stay Meetings, dates discussed:    Comments:  11/24/12 KATHY MAHABIR RN,BSN NCM [phone] D/C HOME.NO NEEDS OR ORDERS.

## 2012-11-26 ENCOUNTER — Telehealth: Payer: Self-pay | Admitting: Family Medicine

## 2012-11-26 NOTE — Telephone Encounter (Signed)
Pt admitted for weakness, found to have SVT. Can we call tomorrow for an update on how he's feeling and weakness - and offer appt within 2 wks.

## 2012-11-27 NOTE — Telephone Encounter (Signed)
Appointment scheduled.

## 2012-12-08 ENCOUNTER — Encounter: Payer: Self-pay | Admitting: Physician Assistant

## 2012-12-08 ENCOUNTER — Ambulatory Visit (INDEPENDENT_AMBULATORY_CARE_PROVIDER_SITE_OTHER): Payer: Medicare Other | Admitting: Physician Assistant

## 2012-12-08 VITALS — BP 114/62 | HR 59 | Ht 70.5 in | Wt 162.4 lb

## 2012-12-08 DIAGNOSIS — I471 Supraventricular tachycardia: Secondary | ICD-10-CM

## 2012-12-08 DIAGNOSIS — E785 Hyperlipidemia, unspecified: Secondary | ICD-10-CM

## 2012-12-08 DIAGNOSIS — I2510 Atherosclerotic heart disease of native coronary artery without angina pectoris: Secondary | ICD-10-CM

## 2012-12-08 DIAGNOSIS — J449 Chronic obstructive pulmonary disease, unspecified: Secondary | ICD-10-CM

## 2012-12-08 DIAGNOSIS — I498 Other specified cardiac arrhythmias: Secondary | ICD-10-CM

## 2012-12-08 NOTE — Progress Notes (Signed)
[address] [place], [place]  [address] Phone: [phone] Fax:  [phone]  Date:  12/08/2012   ID:  Troy [name], DOB November 05, 1932, MRN [id number]  PCP:  Troy Boyden, MD  Primary Cardiologist:  Dr. Valera Castle     History of Present Illness: Troy [name] is a 79 y.o. male who returns for f/u after a recent admission to the hospital 3/10-3/11.  He has a hx of CAD, s/p CABG in 1999, DM2, COPD, HL.  LHC 3/06:  LAD occluded at the origin, mid circumflex 30%, ostial OM1 60-70%, mid ramus intermedius (small) 70%, mid RCA 95%, 80%, LIMA-LAD patent, SVG-D1 patent, SVG-distal RCA patent, EF 65%.  Nuclear study 9/09:  Normal.  Last seen by Dr. Valera Castle in 2011.    He presented to the hospital with fatigue x 2 weeks and developed SVT while in the ED.  He converted to AFib initially then NSR in the ED after administration of adenosine x 1.  Cardiac markers were negative.  He was kept on metoprolol 25 mg bid at d/c and is to get scheduled for an echo.  Since d/c he is doing well.  The patient denies chest pain, shortness of breath, syncope, orthopnea, PND or significant pedal edema.  He denies any palpitations.    Labs (8/12):  TSH 2.94 Labs (2/13):  LDL 77 Labs (3/14):  K 4.2, Cr 1.01, ALT 22, Hgb 12.3  Wt Readings from Last 3 Encounters:  12/08/12 162 lb 6.4 oz (73.664 kg)  11/24/12 156 lb 4.9 oz (70.9 kg)  11/10/12 167 lb 12 oz (76.091 kg)     Past Medical History  Diagnosis Date  . Coronary atherosclerosis of artery bypass graft   . HLD (hyperlipidemia)   . Tobacco use disorder   . Hypertrophy of prostate without urinary obstruction and other lower urinary tract symptoms (LUTS)   . Diabetes mellitus type II ~2008  . Gastrointestinal ulcer 04/2011    with hematemesis s/p EGD by [facility]  . Prostate nodule ~2009    Left-benign s/p eval Uro WNL (Ottelin)  . Chronic obstructive pulmonary disease (COPD)     on CXR  . Barrett's esophagus     EGD 2012,  rpt 2014, longterm PPI  . Thrombocytopenia   . HNP (herniated nucleus pulposus), lumbar 2013    with compression of L5 nerve root and foot drop, also with lumbar DDD s/p surgery  . GERD (gastroesophageal reflux disease)   . Paroxysmal SVT (supraventricular tachycardia)     a. 3/14 => converted in ED with adenosine to AFib => NSR    Current Outpatient Prescriptions  Medication Sig Dispense Refill  . albuterol (PROVENTIL HFA;VENTOLIN HFA) 108 (90 BASE) MCG/ACT inhaler Inhale 2 puffs into the lungs every 4 (four) hours as needed. For shortness or breath, wheezing  1 Inhaler  6  . aspirin 81 MG tablet Take 81 mg by mouth daily.       . isosorbide mononitrate (IMDUR) 30 MG 24 hr tablet Take 0.5 tablets (15 mg total) by mouth daily.  45 tablet  3  . magnesium hydroxide (MILK OF MAGNESIA) 400 MG/5ML suspension Take 15 mLs by mouth daily as needed for constipation.  360 mL  0  . metFORMIN (GLUCOPHAGE) 500 MG tablet Take 1 tablet (500 mg total) by mouth 2 (two) times daily with a meal.  180 tablet  3  . metoprolol (LOPRESSOR) 25 MG tablet Take 1 tablet (25 mg total) by mouth 2 (  two) times daily.  180 tablet  3  . nitroGLYCERIN (NITROSTAT) 0.4 MG SL tablet Place 0.4 mg under the tongue every 5 (five) minutes as needed. For chest pain-may repeat x3      . omeprazole (PRILOSEC) 40 MG capsule Take 1 capsule (40 mg total) by mouth daily.  90 capsule  3  . simvastatin (ZOCOR) 40 MG tablet Take 1 tablet (40 mg total) by mouth at bedtime.  90 tablet  3   No current facility-administered medications for this visit.    Allergies:    Allergies  Allergen Reactions  . Codeine     REACTION: vomiting  . Cyclobenzaprine Hcl     REACTION: vomiting  . Hydrocodone Nausea Only and Other (See Comments)    "crazy"  . Valium Other (See Comments)    sedation    Social History:  The patient  reports that he has been smoking Cigarettes.  He started smoking about 60 years ago. He has a 60 pack-year smoking history.  He uses smokeless tobacco. He reports that  drinks alcohol. He reports that he does not use illicit drugs.   ROS:  Please see the history of present illness.      All other systems reviewed and negative.   PHYSICAL EXAM: VS:  BP 114/62  Pulse 59  Ht 5' 10.5" (1.791 m)  Wt 162 lb 6.4 oz (73.664 kg)  BMI 22.96 kg/m2 Well nourished, well developed, in no acute distress HEENT: normal Neck: no JVD Cardiac:  normal S1, S2; RRR; no murmur Lungs: decreased breath sounds bilaterally, no wheezing, rhonchi or rales Abd: soft, nontender, no hepatomegaly Ext: no edema Skin: warm and dry Neuro:  CNs 2-12 intact, no focal abnormalities noted  EKG:  Sinus brady, HR 59, normal axis, NSSTTW changes     ASSESSMENT AND PLAN:  1. SVT: This was felt to be an incidental finding after presentation to the emergency room. He has not had any other symptoms like he did when he was in SVT. Continue metoprolol. Arrange echocardiogram as previously planned. I will also set him up for a TSH. 2. Coronary Artery Disease:  Stable. He denies angina. It has been many years since his last stress test. He did have some ST changes when he was in SVT. I have recommended proceeding with stress testing today. He prefers to hold off for now. Continue aspirin and statin. 3. Hypertension:  Controlled.  Continue current therapy. 4. Hyperlipidemia: Continue statin. 5. COPD:  He recently quit smoking.  6. Disposition: Follow up with Dr. Daleen Squibb in 2-3 months.  Luna Glasgow, PA-C  8:57 AM 12/08/2012

## 2012-12-08 NOTE — Patient Instructions (Addendum)
Your physician has requested that you have an echocardiogram. Echocardiography is a painless test that uses sound waves to create images of your heart. It provides your doctor with information about the size and shape of your heart and how well your heart's chambers and valves are working. This procedure takes approximately one hour. There are no restrictions for this procedure.  Your physician recommends that you return for lab work on 3/28 at your primary physician's office  Your physician recommends that you schedule a follow-up appointment in: 2-3 months with Dr Daleen Squibb

## 2012-12-11 ENCOUNTER — Ambulatory Visit (INDEPENDENT_AMBULATORY_CARE_PROVIDER_SITE_OTHER): Payer: Medicare Other | Admitting: Family Medicine

## 2012-12-11 ENCOUNTER — Encounter: Payer: Self-pay | Admitting: *Deleted

## 2012-12-11 ENCOUNTER — Encounter: Payer: Self-pay | Admitting: Family Medicine

## 2012-12-11 VITALS — BP 124/60 | HR 60 | Temp 98.5°F | Wt 165.8 lb

## 2012-12-11 DIAGNOSIS — E119 Type 2 diabetes mellitus without complications: Secondary | ICD-10-CM

## 2012-12-11 DIAGNOSIS — Z87891 Personal history of nicotine dependence: Secondary | ICD-10-CM

## 2012-12-11 DIAGNOSIS — I498 Other specified cardiac arrhythmias: Secondary | ICD-10-CM

## 2012-12-11 DIAGNOSIS — K5900 Constipation, unspecified: Secondary | ICD-10-CM

## 2012-12-11 DIAGNOSIS — I471 Supraventricular tachycardia: Secondary | ICD-10-CM

## 2012-12-11 DIAGNOSIS — K5904 Chronic idiopathic constipation: Secondary | ICD-10-CM

## 2012-12-11 DIAGNOSIS — J449 Chronic obstructive pulmonary disease, unspecified: Secondary | ICD-10-CM

## 2012-12-11 LAB — CBC WITH DIFFERENTIAL/PLATELET
Basophils Relative: 0.4 % (ref 0.0–3.0)
Eosinophils Absolute: 0.1 10*3/uL (ref 0.0–0.7)
HCT: 38.1 % — ABNORMAL LOW (ref 39.0–52.0)
Hemoglobin: 12.9 g/dL — ABNORMAL LOW (ref 13.0–17.0)
Lymphs Abs: 2.6 10*3/uL (ref 0.7–4.0)
MCHC: 34 g/dL (ref 30.0–36.0)
MCV: 89.4 fl (ref 78.0–100.0)
Monocytes Absolute: 0.6 10*3/uL (ref 0.1–1.0)
Neutrophils Relative %: 49.7 % (ref 43.0–77.0)
Platelets: 145 10*3/uL — ABNORMAL LOW (ref 150.0–400.0)
RBC: 4.27 Mil/uL (ref 4.22–5.81)
WBC: 6.7 10*3/uL (ref 4.5–10.5)

## 2012-12-11 LAB — BASIC METABOLIC PANEL
BUN: 19 mg/dL (ref 6–23)
Calcium: 9.3 mg/dL (ref 8.4–10.5)
GFR: 52.61 mL/min — ABNORMAL LOW (ref >60.00)
Glucose, Bld: 107 mg/dL — ABNORMAL HIGH (ref 70–99)
Potassium: 4.9 mEq/L (ref 3.5–5.1)
Sodium: 136 mEq/L (ref 135–145)

## 2012-12-11 LAB — TSH: TSH: 2.48 u[IU]/mL (ref 0.35–5.50)

## 2012-12-11 MED ORDER — ALBUTEROL SULFATE (2.5 MG/3ML) 0.083% IN NEBU
2.5000 mg | INHALATION_SOLUTION | Freq: Once | RESPIRATORY_TRACT | Status: AC
  Administered 2012-12-11: 2.5 mg via RESPIRATORY_TRACT

## 2012-12-11 NOTE — Assessment & Plan Note (Signed)
Resolved. Stable on metoprolol 25mg  bid.

## 2012-12-11 NOTE — Patient Instructions (Signed)
Let's monitor constipation - if worsening, restart linzess. Check to see if you're taking imdur (isosorbide) 1/2 tablet at home. Congratulations on quitting smoking!  Keep it up.  Recruit your wife to quit with you. Spirometry today - we will call you with results (to see if you would benefit from a daily medicine for breathing). Blood work today. Return as needed or in 4-6 months for follow up.

## 2012-12-11 NOTE — Assessment & Plan Note (Signed)
Congratulated on quitting 10/2012.  Encouraged sustaining this. Has declined LRCT for lung cancer screening in past.

## 2012-12-11 NOTE — Assessment & Plan Note (Signed)
No longer on linzess, but that significantly helped. Takes MOM prn.

## 2012-12-11 NOTE — Progress Notes (Signed)
Subjective:    Patient ID: Troy [name], male    DOB: [birth year], 79 y.o.   MRN: [id number]  HPI CC: hosp f/u  Mr. Hollenbeck presents today as hospital follow up.  Seen here 11/10/2012 with dx COPD exacerbation, treated initially with doxycycline and prednisone course.  after didn't improve with this, placed on levaquin course.  A few weeks later improved breathing/cough but marked fatigue/weakness.  Wife called EMS and found to be dehydrated, taken to ER - had episode of SVT that broke with adenosine.  Fatigue improved.  Cough improved.  No further fevers/chills.  Denies significant dyspnea at rest or with exertion (no dyspnea with walking to mailbox) Smoking - quit smoking 11/10/2012!  Notices weight gain Wt Readings from Last 3 Encounters:  12/11/12 165 lb 12 oz (75.184 kg)  12/08/12 162 lb 6.4 oz (73.664 kg)  11/24/12 156 lb 4.9 oz (70.9 kg)   COPD - by CXR and clincially.  Has not had spirometry - will obtain today.  Takes albuterol prn, none recent.  F/u phone call: 11/27/2012 Admit date: 11/23/2012  Discharge date: 11/24/2012  Recommendations for Outpatient Follow-up:  1. Pt will need to follow up with PCP in 2-3 weeks post discharge 2. Please obtain BMP to evaluate electrolytes and kidney function 3. Please also check CBC to evaluate Hg and Hct levels 4. Will need to follow up with his cardiologist in 1 week, pt verbalized understanding  Discharge Diagnoses: SVT, resolved to sinus rhythm  Active Problems:  DIABETES MELLITUS, TYPE II  COPD, mild  Generalized weakness  Supraventricular tachycardia  History of coronary artery disease  Discharge Condition: Stable  Diet recommendation: Heart healthy diet discussed in details  History of present illness:  Pt is 79 y.o.male with hx relevant for chronic ischemic heart disease (followed by Dr. Daleen Squibb), ongoing tobacco abuse, DM, COPD who presented to the ED with lethargy, fatigue for 2 weeks. Progressive. While in the ED, he was noted  to have a paroxsymal episode of SVT. This broke with adenosine and post chemical cardioversion he was noted to be in atrial fibrillation. He has now converted to NSR. He denies any current or recent angina (excluding during tonight's episode of SVT). He denies orthopnea, PND, LE edema.  Hospital Course:  Active Problems:  SVT  - now back in sinus rhythm  - continue Metoprolol as recommended by cardiologist  - pt verbalized understanding  - no events on tele, CE x 2 sets negative  DIABETES MELLITUS, TYPE II  - stable inpatient CBG's  COPD, mild  - pt maintaining oxygen saturations at target range  History of coronary artery disease  - clinically stable at this time  Procedures/Studies:  Dg Chest Port 1 View 11/23/2012 --> COPD changes. Post CABG. No acute abnormalities.  Consultations:  Cardiology Antibiotics:  None Discharge Exam:  Filed Vitals:    11/24/12 0610   BP:  117/65   Pulse:  60   Temp:  97.9 F (36.6 C)   Resp:  18    Filed Vitals:    11/24/12 0208  11/24/12 0218  11/24/12 0320  11/24/12 0610   BP:  144/59  121/54   117/65   Pulse:  65    60   Temp:  97.4 F (36.3 C)    97.9 F (36.6 C)   TempSrc:  Oral    Oral   Resp:  18   18  18    Height:  5' 10.5" (1.791 m)  Weight:  156 lb 4.9 oz (70.9 kg)      SpO2:  98%    99%   General: Pt is alert, follows commands appropriately, not in acute distress  Cardiovascular: Regular rate and rhythm, S1/S2 +, no murmurs, no rubs, no gallops  Respiratory: Clear to auscultation bilaterally, no wheezing, no crackles, no rhonchi  Abdominal: Soft, non tender, non distended, bowel sounds +, no guarding  Extremities: no edema, no cyanosis, pulses palpable bilaterally DP and PT  Neuro: Grossly nonfocal  Discharge Instructions  Discharge Orders    Future Orders  Complete By  Expires     Diet - low sodium heart healthy  As directed      Increase activity slowly  As directed          Medication List     TAKE these  medications       albuterol 108 (90 BASE) MCG/ACT inhaler    Commonly known as: PROVENTIL HFA;VENTOLIN HFA    Inhale 2 puffs into the lungs every 4 (four) hours as needed. For shortness or breath, wheezing    aspirin 81 MG tablet    Take 81 mg by mouth daily.    Ipratropium-Albuterol 20-100 MCG/ACT Aers respimat    Commonly known as: COMBIVENT    Inhale 1 puff into the lungs every 6 (six) hours.    isosorbide mononitrate 30 MG 24 hr tablet    Commonly known as: IMDUR    Take 0.5 tablets (15 mg total) by mouth daily.    levofloxacin 500 MG tablet    Commonly known as: LEVAQUIN    Take 1 tablet (500 mg total) by mouth daily.    magnesium hydroxide 400 MG/5ML suspension    Commonly known as: MILK OF MAGNESIA    Take 15 mLs by mouth daily as needed for constipation.    metFORMIN 500 MG tablet    Commonly known as: GLUCOPHAGE    Take 1 tablet (500 mg total) by mouth 2 (two) times daily with a meal.    metoprolol tartrate 25 MG tablet    Commonly known as: LOPRESSOR    Take 1 tablet (25 mg total) by mouth 2 (two) times daily.    nitroGLYCERIN 0.4 MG SL tablet    Commonly known as: NITROSTAT    Place 0.4 mg under the tongue every 5 (five) minutes as needed. For chest pain-may repeat x3    omeprazole 40 MG capsule    Commonly known as: PRILOSEC    Take 1 capsule (40 mg total) by mouth daily.    simvastatin 40 MG tablet    Commonly known as: ZOCOR    Take 1 tablet (40 mg total) by mouth at bedtime.     Past Medical History  Diagnosis Date  . Coronary atherosclerosis of artery bypass graft   . HLD (hyperlipidemia)   . Tobacco use disorder   . Hypertrophy of prostate without urinary obstruction and other lower urinary tract symptoms (LUTS)   . Diabetes mellitus type II ~2008  . Gastrointestinal ulcer 04/2011    with hematemesis s/p EGD by [facility]  . Prostate nodule ~2009    Left-benign s/p eval Uro WNL (Ottelin)  . Chronic obstructive pulmonary disease (COPD)     on CXR  .  Barrett's esophagus     EGD 2012, rpt 2014, longterm PPI  . Thrombocytopenia   . HNP (herniated nucleus pulposus), lumbar 2013    with compression of L5 nerve root and foot drop, also  with lumbar DDD s/p surgery  . GERD (gastroesophageal reflux disease)   . Paroxysmal SVT (supraventricular tachycardia)     a. 3/14 => converted in ED with adenosine to AFib => NSR     Review of Systems Per HPI    Objective:   Physical Exam  Nursing note and vitals reviewed. Constitutional: He appears well-developed and well-nourished. No distress.  HENT:  Mouth/Throat: Oropharynx is clear and moist. No oropharyngeal exudate.  Eyes: Conjunctivae and EOM are normal. Pupils are equal, round, and reactive to light.  Neck: Normal range of motion. Neck supple.  Cardiovascular: Normal rate, regular rhythm, normal heart sounds and intact distal pulses.   No murmur heard. Pulmonary/Chest: Effort normal and breath sounds normal. No respiratory distress. He has no wheezes. He has no rales.  End expiratory coarse wheeze bibasilarly Coarse breath sounds  Lymphadenopathy:    He has no cervical adenopathy.  Skin: Skin is warm and dry.  Psychiatric: He has a normal mood and affect.       Assessment & Plan:

## 2012-12-11 NOTE — Assessment & Plan Note (Addendum)
Spirometry today.  Mild in past. Extensive smoking history. Progression to moderate COPD. Given recently quit smoking and denies dyspnea, will monitor for now. If progressing dyspnea consider foradil.

## 2012-12-23 ENCOUNTER — Ambulatory Visit (HOSPITAL_COMMUNITY): Payer: Medicare Other | Attending: Cardiovascular Disease

## 2012-12-23 DIAGNOSIS — I471 Supraventricular tachycardia: Secondary | ICD-10-CM

## 2012-12-23 DIAGNOSIS — I498 Other specified cardiac arrhythmias: Secondary | ICD-10-CM | POA: Insufficient documentation

## 2012-12-23 NOTE — Progress Notes (Signed)
Echocardiogram performed.  

## 2012-12-24 ENCOUNTER — Encounter: Payer: Self-pay | Admitting: Physician Assistant

## 2012-12-25 ENCOUNTER — Telehealth: Payer: Self-pay | Admitting: *Deleted

## 2012-12-25 NOTE — Telephone Encounter (Signed)
Message copied by Tarri Fuller on Fri Dec 25, 2012  8:54 AM ------      Message from: [place], [place] T      Created: Thu Dec 24, 2012  5:23 PM       Normal EF.  Mild diastolic dysfunction.      Continue with current treatment plan.      Tereso Newcomer, PA-C  5:23 PM 12/24/2012 ------

## 2012-12-25 NOTE — Telephone Encounter (Signed)
wife notified about echo results with verbal understanding today, said she will let pt know of results.

## 2013-03-11 ENCOUNTER — Ambulatory Visit (INDEPENDENT_AMBULATORY_CARE_PROVIDER_SITE_OTHER)
Admission: RE | Admit: 2013-03-11 | Discharge: 2013-03-11 | Disposition: A | Discharge status: Home / Self Care | Payer: Medicare Other | Source: Ambulatory Visit | Attending: Family Medicine | Admitting: Family Medicine

## 2013-03-11 ENCOUNTER — Encounter: Payer: Self-pay | Admitting: Family Medicine

## 2013-03-11 ENCOUNTER — Ambulatory Visit: Payer: Medicare Other | Admitting: Family Medicine

## 2013-03-11 ENCOUNTER — Ambulatory Visit (INDEPENDENT_AMBULATORY_CARE_PROVIDER_SITE_OTHER): Payer: Medicare Other | Admitting: Family Medicine

## 2013-03-11 ENCOUNTER — Telehealth: Payer: Self-pay | Admitting: Family Medicine

## 2013-03-11 VITALS — BP 128/50 | HR 67 | Temp 97.8°F | Wt 162.5 lb

## 2013-03-11 DIAGNOSIS — R0789 Other chest pain: Secondary | ICD-10-CM

## 2013-03-11 DIAGNOSIS — R071 Chest pain on breathing: Secondary | ICD-10-CM

## 2013-03-11 MED ORDER — TRAMADOL HCL 50 MG PO TABS: 50.0000 mg | ORAL_TABLET | Freq: Three times a day (TID) | ORAL | Status: DC | PRN

## 2013-03-11 NOTE — Patient Instructions (Addendum)
I didn't see a broken rib or any other new changes.  I'll await the radiology report.  Take the tramadol for pain.  Continue tylenol.  Take care.  Glad to see you.

## 2013-03-11 NOTE — Telephone Encounter (Signed)
Pt scheduled appt today at 12:30 with Dr Para March. Pt said in no distress, hurts in chest when takes deep breath or coughs. Advised pt if condition changes or worsens prior to appt to go to ED. Pt voiced understanding.

## 2013-03-11 NOTE — Telephone Encounter (Signed)
Patient Information:  Caller Name: Alexandar  Phone: [phone]  Patient: Troy [name], Troy [name]  Gender: Male  DOB: 10-09-1932  Age: 79 Years  PCP: Eustaquio Boyden [facility])  Office Follow Up:  Does the office need to follow up with this patient?: Yes  Instructions For The Office: Caller refusing the Emergency Department.  RN Note:  Caller denies bruising. Caller states pain is on left side of chest in rib cage. Caller is able to breathe without difficulty however taking a deep breath or coughing there is pain. Caller is eating, drinking, and voiding within normal limits.  Symptoms  Reason For Call & Symptoms: caller hit chest on truck bed while jumping into truck  Reviewed Health History In EMR: Yes  Reviewed Medications In EMR: Yes  Reviewed Allergies In EMR: Yes  Reviewed Surgeries / Procedures: Yes  Date of Onset of Symptoms: 03/08/2013  Treatments Tried: tylenol  Treatments Tried Worked: No  Guideline(s) Used:  Chest Injury  Disposition Per Guideline:   Go to ED Now (or to Office with PCP Approval)  Reason For Disposition Reached:   Can't take a deep breath but no respiratory distress (e.g., hurts to take a deep breath)  Advice Given:  Apply Heat to the Area:  Beginning 48 hours after an injury, apply a warm washcloth or heating pad for 10 minutes three times a day.  This will help increase blood flow and improve healing.  Limit Activities:  Avoid strenuous activities or contact sports until the pain is gone.  Call Back If:  Swelling or bruise becomes over 2 inches (5 cm).  Pain not improved after 72 hours  Pain or swelling lasts over 7 days  You become worse.  Patient Refused Recommendation:  Patient Will Follow Up With Office Later  Caller would like to be seen in the office

## 2013-03-11 NOTE — Telephone Encounter (Signed)
See about putting him on at 12:30 today. If worsening in the meantime, needs to go to ER.  Thanks.

## 2013-03-11 NOTE — Progress Notes (Signed)
Patient does not have a list of medications with him, states everything is the same as last OV.  Displays a congested cough, asked if he has been using his inhaler and he said "No".  He didn't need it.  He says the problem is hurting with getting a deep breath, not SOB.  Ninetta Lights, CMA  Pain on R chest after hitting a truck bed on Monday.  Pain with deep breath, sneeze, cough, laugh.  Hasn't had to use SABA. Can walk w/o SOB. Pain got worse hours after the event.  No bruising.  No FCNAVD.    Taking tylenol for pain w/o much help  Meds, vitals, and allergies reviewed.   ROS: See HPI.  Otherwise, noncontributory.  nad but uncomfortable ncat Mmm rrr ctab but global dec in BS noted L chest wall ttp w/o mass, no rash, no bruising  cxr reviewed.

## 2013-03-11 NOTE — Telephone Encounter (Signed)
Noted. Thanks.

## 2013-03-12 DIAGNOSIS — R0789 Other chest pain: Secondary | ICD-10-CM | POA: Insufficient documentation

## 2013-03-12 NOTE — Assessment & Plan Note (Signed)
Benign exam, CXR benign.  Tramadol for pain and f/u prn.  He agrees.  Should gradually improve.

## 2013-03-24 ENCOUNTER — Ambulatory Visit (INDEPENDENT_AMBULATORY_CARE_PROVIDER_SITE_OTHER): Payer: Medicare Other | Admitting: Family Medicine

## 2013-03-24 ENCOUNTER — Telehealth: Payer: Self-pay | Admitting: Family Medicine

## 2013-03-24 ENCOUNTER — Encounter: Payer: Self-pay | Admitting: Family Medicine

## 2013-03-24 VITALS — BP 112/60 | HR 64 | Temp 98.7°F

## 2013-03-24 DIAGNOSIS — J449 Chronic obstructive pulmonary disease, unspecified: Secondary | ICD-10-CM

## 2013-03-24 MED ORDER — AZITHROMYCIN 250 MG PO TABS: ORAL_TABLET | ORAL | Status: DC

## 2013-03-24 NOTE — Telephone Encounter (Signed)
Patient Information:  Caller Name: Yonas  Phone: [phone]  Patient: Troy [name], Troy [name]  Gender: Male  DOB: [birth year]  Age: 79 Years  PCP: Eustaquio Boyden [facility])  Office Follow Up:  Does the office need to follow up with this patient?: Yes  Instructions For The Office: PLEASE CALL PT BACK FOR SAME DAY APPT.  RN Note:  Cough is productive, yellow sputum.  Pt has vomited x1 due to hard coughing, states he is clammy w/ body aches.  Pt denies Chest pain.  No same day appt.  PLEASE CALL PT BACK FOR SAME DAY APPT. DUE TO HARD COUGHING.   Symptoms  Reason For Call & Symptoms: Productive Cough, Body Aches w/ Sweating  Reviewed Health History In EMR: Yes  Reviewed Medications In EMR: Yes  Reviewed Allergies In EMR: Yes  Reviewed Surgeries / Procedures: Yes  Date of Onset of Symptoms: 03/20/2013  Treatments Tried: Tylenol x2 taken at 8am on 7-9  Treatments Tried Worked: No  Any Fever: Yes  Fever Taken: Tactile  Fever Time Of Reading: 09:07:26  Fever Last Reading: N/A  Guideline(s) Used:  Cough  Disposition Per Guideline:   See Today in Office  Reason For Disposition Reached:   Severe coughing spells (e.g., whooping sound after coughing, vomiting after coughing)  Advice Given:  N/A  Patient Will Follow Care Advice:  YES

## 2013-03-24 NOTE — Telephone Encounter (Signed)
Will see today.  

## 2013-03-24 NOTE — Telephone Encounter (Signed)
Scheduled with Dr. Para March today at 3:30. Patient states he has been coughing for 3-4 days, ? Fever. Known hx of COPD exacerbation.

## 2013-03-24 NOTE — Telephone Encounter (Signed)
fhanks for seeing patient. Per recent spirometry: Progression to moderate COPD. Given recently quit smoking and denies dyspnea, will monitor for now.  If progressing dyspnea consider foradil.

## 2013-03-24 NOTE — Patient Instructions (Addendum)
Start the antibiotics today and let us know if you aren't improving.  Take care.

## 2013-03-24 NOTE — Assessment & Plan Note (Signed)
Presumed exacerbation but no wheeze.  Would not use pred at this point w/o wheeze.  Start zmax and f/u prn.  Nontoxic. Okay for outpatient f/u.  He agrees.

## 2013-03-24 NOTE — Progress Notes (Signed)
The pain from the truck bed encounter got better.  In the interval, about 4-5 day ago he started to get sick. Felt diffuse aches.  Then had some sweats at night.  Some fevers, likley, felt hot.  Coughing.  Some sputum, yellow.  Not more SOB than usual.  He hasn't noted wheezing.  No other sick contacts. He feels a little less bad than he did a few days ago.  His inhaler doesn't make much difference recently.  Some ST day before yesterday, some better today.   Meds, vitals, and allergies reviewed.   ROS: See HPI.  Otherwise, noncontributory.  GEN: nad, alert and oriented HEENT: mucous membranes moist, TM wnl, op wnl NECK: supple w/o LA CV: rrr. PULM: cough noted, but no focal dec in BS B no inc wob ABD: soft, +bs EXT: no edema SKIN: no acute rash

## 2013-03-25 ENCOUNTER — Other Ambulatory Visit: Payer: Self-pay

## 2013-03-29 ENCOUNTER — Encounter: Payer: Self-pay | Admitting: Family Medicine

## 2013-03-29 ENCOUNTER — Ambulatory Visit (INDEPENDENT_AMBULATORY_CARE_PROVIDER_SITE_OTHER): Payer: Medicare Other | Admitting: Family Medicine

## 2013-03-29 VITALS — BP 132/64 | HR 68 | Temp 98.2°F | Wt 162.8 lb

## 2013-03-29 DIAGNOSIS — J441 Chronic obstructive pulmonary disease with (acute) exacerbation: Secondary | ICD-10-CM | POA: Insufficient documentation

## 2013-03-29 DIAGNOSIS — J449 Chronic obstructive pulmonary disease, unspecified: Secondary | ICD-10-CM

## 2013-03-29 DIAGNOSIS — J209 Acute bronchitis, unspecified: Secondary | ICD-10-CM

## 2013-03-29 MED ORDER — BUDESONIDE-FORMOTEROL FUMARATE 160-4.5 MCG/ACT IN AERO: 2.0000 | INHALATION_SPRAY | Freq: Two times a day (BID) | RESPIRATORY_TRACT | Status: DC

## 2013-03-29 MED ORDER — ALBUTEROL SULFATE HFA 108 (90 BASE) MCG/ACT IN AERS: 2.0000 | INHALATION_SPRAY | RESPIRATORY_TRACT | Status: DC | PRN

## 2013-03-29 MED ORDER — NITROGLYCERIN 0.4 MG SL SUBL: 0.4000 mg | SUBLINGUAL_TABLET | SUBLINGUAL | Status: DC | PRN

## 2013-03-29 NOTE — Assessment & Plan Note (Signed)
Without significant wheezing. Has completed zpack course - doubt needs another abx course currently. Start symbicort (sample provided) - if notes improvement, will recommend start daily. Update if sxs persist or deteriorate.

## 2013-03-29 NOTE — Patient Instructions (Addendum)
Take symbicort 2 puffs twice daily for 1 week then decrease to 1 puff twice daily (time with dental hygiene). If not feeling better with this after next few days, or any worsening symptoms, let me know. Fatigue /achiness should improve with time. Push fluids and rest. Use over the counter delsym or dimetapp for cough at night time. Take albuterol inhaler 2-3 times daily as needed.

## 2013-03-29 NOTE — Assessment & Plan Note (Signed)
With frequent exacerbations - recommended start symbicort (sample provided).

## 2013-03-29 NOTE — Progress Notes (Signed)
Subjective:    Patient ID: Troy [name], male    DOB: 18-Aug-1933, 79 y.o.   MRN: [id number]  HPI CC: recheck, not feeling better.  Seen here 03/24/2013 with 4-5 d h/o cough, dx of COPD exac without wheeze, treated with zpack. Dr. Ashok Cordia note reviewed.  Breathing ok, just feels weak, sore, achey.  Having facial pain and body aches.  Hasn't tried anything else.  Mild wheeze.  + coughing productive of white sputum.  + rhinorrhea.  Wife now sick as well.  Stays mildly short winded.  Smoker - 1/2 ppd.  Per recent spirometry (11/2012): Progression to moderate COPD.    Past Medical History  Diagnosis Date  . Coronary atherosclerosis of artery bypass graft   . HLD (hyperlipidemia)   . Tobacco use disorder   . Hypertrophy of prostate without urinary obstruction and other lower urinary tract symptoms (LUTS)   . Diabetes mellitus type II ~2008  . Gastrointestinal ulcer 04/2011    with hematemesis s/p EGD by [facility]  . Prostate nodule ~2009    Left-benign s/p eval Uro WNL (Ottelin)  . Chronic obstructive pulmonary disease (COPD)     on CXR  . Barrett's esophagus     EGD 2012, rpt 2014, longterm PPI  . Thrombocytopenia   . HNP (herniated nucleus pulposus), lumbar 2013    with compression of L5 nerve root and foot drop, also with lumbar DDD s/p surgery  . GERD (gastroesophageal reflux disease)   . Paroxysmal SVT (supraventricular tachycardia)     a. 3/14 => converted in ED with adenosine to AFib => NSR;  b. Echo 4/14:  Mild LVH, mild FBSH, EF 55-60%, Gr 1 DD    Past Surgical History  Procedure Laterality Date  . Coronary artery bypass graft  1999    Dr. Cornelius Moras  . Hemorrhoid surgery    . Nose surgery    . Rotator cuff repair  04/2010    right (but has bilat) Dr. Yisroel Ramming ([facility])  . Colonoscopy  03/2009    small hemorrhoids, 2 polyps (hyperplastic and adenomatous), rpt 5 yrs (Dr. Maryjane Hurter)  . Esophagogastroduodenoscopy  05/16/2011    after hematemesis, no lesions found,  consistent with barrett's esophagus rpt 2 yrs  . Lumbar laminectomy/decompression microdiscectomy  01/31/2012    Procedure: LUMBAR LAMINECTOMY/DECOMPRESSION MICRODISCECTOMY 1 LEVEL;  Surgeon: Carmela Hurt, MD;  Location: [facility];  Service: Neurosurgery;  Laterality: Left;  LEFT Lumbar Four-Five Diskectomy    Review of Systems Per HPI    Objective:   Physical Exam  Nursing note and vitals reviewed. Constitutional: He appears well-developed and well-nourished. No distress.  HENT:  Head: Normocephalic and atraumatic.  Right Ear: Hearing, tympanic membrane, external ear and ear canal normal.  Left Ear: Hearing, tympanic membrane, external ear and ear canal normal.  Nose: Nose normal. No mucosal edema or rhinorrhea. Right sinus exhibits no maxillary sinus tenderness and no frontal sinus tenderness. Left sinus exhibits no maxillary sinus tenderness and no frontal sinus tenderness.  Mouth/Throat: Uvula is midline, oropharynx is clear and moist and mucous membranes are normal. No oropharyngeal exudate, posterior oropharyngeal edema, posterior oropharyngeal erythema or tonsillar abscesses.  Eyes: Conjunctivae and EOM are normal. Pupils are equal, round, and reactive to light. No scleral icterus.  Neck: Normal range of motion. Neck supple.  Cardiovascular: Normal rate, regular rhythm, normal heart sounds and intact distal pulses.   No murmur heard. Pulmonary/Chest: Effort normal and breath sounds normal. No respiratory distress. He has no wheezes.  He has no rales.  Slightly decreased air movement but no wheeze heard  Lymphadenopathy:    He has no cervical adenopathy.  Skin: Skin is warm and dry. No rash noted.       Assessment & Plan:

## 2013-04-07 ENCOUNTER — Encounter: Payer: Self-pay | Admitting: Family Medicine

## 2013-04-07 ENCOUNTER — Ambulatory Visit (INDEPENDENT_AMBULATORY_CARE_PROVIDER_SITE_OTHER): Payer: Medicare Other | Admitting: Family Medicine

## 2013-04-07 VITALS — BP 138/76 | HR 60 | Temp 98.1°F | Wt 158.5 lb

## 2013-04-07 DIAGNOSIS — R634 Abnormal weight loss: Secondary | ICD-10-CM

## 2013-04-07 DIAGNOSIS — J441 Chronic obstructive pulmonary disease with (acute) exacerbation: Secondary | ICD-10-CM

## 2013-04-07 MED ORDER — AMOXICILLIN-POT CLAVULANATE 875-125 MG PO TABS: 1.0000 | ORAL_TABLET | Freq: Two times a day (BID) | ORAL | Status: AC

## 2013-04-07 MED ORDER — PREDNISONE 20 MG PO TABS: ORAL_TABLET | ORAL | Status: DC

## 2013-04-07 NOTE — Assessment & Plan Note (Signed)
20 lb weight loss over last 3 years.  Not trying. Advised to return in 1 month to recheck, in interim, start glucerna shake one daily 30 min prior to largest meal. Low albumin last checked 11/2012.  Will check further blood work at f/u visit, consider LRCT of lungs.

## 2013-04-07 NOTE — Progress Notes (Signed)
  Subjective:    Patient ID: Troy [name], male    DOB: 12-06-1932, 79 y.o.   MRN: [id number]  HPI CC: no better  See prior note for details. Briefly, seen here 03/24/2013 with dx COPD exac without wheeze, treated with zpack.  Seen here by myself 03/29/2013 with dx resolving bronchitis, recommended start symbicort (sample provided).  No fevers/chills, minimal ST, no congestion or ear pain.  Persistent cough productive of yellowish mucous, weak, sore, achy, headache.    Smoker, 1/2 ppd.   Last spirometry 11/2012 - progression to moderate COPD.  Wt Readings from Last 3 Encounters:  04/07/13 158 lb 8 oz (71.895 kg)  03/29/13 162 lb 12 oz (73.823 kg)  03/11/13 162 lb 8 oz (73.71 kg)  noted 20 lb weight loss over last several years.  Denies change in appetite.  Eating 2-3 meals/day.  No fevers, night sweats.  Fatigue started with acute infection. Lab Results  Component Value Date   HGBA1C 6.5 04/29/2012    Past Medical History  Diagnosis Date  . Coronary atherosclerosis of artery bypass graft   . HLD (hyperlipidemia)   . Tobacco use disorder   . Hypertrophy of prostate without urinary obstruction and other lower urinary tract symptoms (LUTS)   . Diabetes mellitus type II ~2008  . Gastrointestinal ulcer 04/2011    with hematemesis s/p EGD by [facility]  . Prostate nodule ~2009    Left-benign s/p eval Uro WNL (Ottelin)  . Chronic obstructive pulmonary disease (COPD)     spirometry with moderate COPD  . Barrett's esophagus     EGD 2012, rpt 2014, longterm PPI  . Thrombocytopenia   . HNP (herniated nucleus pulposus), lumbar 2013    with compression of L5 nerve root and foot drop, also with lumbar DDD s/p surgery  . GERD (gastroesophageal reflux disease)   . Paroxysmal SVT (supraventricular tachycardia)     a. 3/14 => converted in ED with adenosine to AFib => NSR;  b. Echo 4/14:  Mild LVH, mild FBSH, EF 55-60%, Gr 1 DD    Review of Systems Per HPI    Objective:   Physical Exam   Nursing note and vitals reviewed. Constitutional: He appears well-developed and well-nourished. No distress.  HENT:  Head: Normocephalic and atraumatic.  Right Ear: Hearing, tympanic membrane, external ear and ear canal normal.  Left Ear: Hearing, tympanic membrane, external ear and ear canal normal.  Nose: Nose normal. No mucosal edema or rhinorrhea.  Mouth/Throat: Uvula is midline, oropharynx is clear and moist and mucous membranes are normal. No oropharyngeal exudate, posterior oropharyngeal edema, posterior oropharyngeal erythema or tonsillar abscesses.  Eyes: Conjunctivae and EOM are normal. Pupils are equal, round, and reactive to light. No scleral icterus.  Neck: Normal range of motion. Neck supple. No thyromegaly present.  Cardiovascular: Normal rate, regular rhythm, normal heart sounds and intact distal pulses.   No murmur heard. Pulmonary/Chest: Effort normal. No respiratory distress. He has no decreased breath sounds. He has wheezes (end exp coarse wheeze). He has no rhonchi. He has no rales.  rattly cough present  Lymphadenopathy:    He has no cervical adenopathy.  Skin: Skin is warm and dry. No rash noted.       Assessment & Plan:

## 2013-04-07 NOTE — Patient Instructions (Signed)
For weight loss - start glucerna supplement (30 min before largest meal of the day) and return in 1 month to recheck weight.  We may draw blood work and further evlauation at that time. For cough - I think you have persistent COPD flare with bronchitis.  Treat with augmentin course and steroid course.  Hold symbicort while on oral steroids.  May use albuterol as needed. Let me know if not improving cough or any worsening symptoms. Return in 1 month for follow up weight

## 2013-04-07 NOTE — Assessment & Plan Note (Signed)
Never fully cleared with zpack or symbicort - today with wheezing and productive cough. Will treat with augmentin course and oral prednisone course.  Advised to hold symbicort while on oral prednisone, and to use albuterol prn wheeze/dyspnea. Pt agrees with plan.

## 2013-05-12 ENCOUNTER — Encounter: Payer: Self-pay | Admitting: Family Medicine

## 2013-05-12 ENCOUNTER — Ambulatory Visit (INDEPENDENT_AMBULATORY_CARE_PROVIDER_SITE_OTHER): Payer: Medicare Other | Admitting: Family Medicine

## 2013-05-12 VITALS — BP 134/70 | HR 50 | Temp 98.2°F | Wt 162.5 lb

## 2013-05-12 DIAGNOSIS — J4489 Other specified chronic obstructive pulmonary disease: Secondary | ICD-10-CM

## 2013-05-12 DIAGNOSIS — D696 Thrombocytopenia, unspecified: Secondary | ICD-10-CM

## 2013-05-12 DIAGNOSIS — K5900 Constipation, unspecified: Secondary | ICD-10-CM

## 2013-05-12 DIAGNOSIS — K5904 Chronic idiopathic constipation: Secondary | ICD-10-CM

## 2013-05-12 DIAGNOSIS — J449 Chronic obstructive pulmonary disease, unspecified: Secondary | ICD-10-CM

## 2013-05-12 DIAGNOSIS — R634 Abnormal weight loss: Secondary | ICD-10-CM

## 2013-05-12 DIAGNOSIS — F17200 Nicotine dependence, unspecified, uncomplicated: Secondary | ICD-10-CM

## 2013-05-12 MED ORDER — LINACLOTIDE 145 MCG PO CAPS: 145.0000 ug | ORAL_CAPSULE | Freq: Every day | ORAL | Status: DC

## 2013-05-12 NOTE — Assessment & Plan Note (Signed)
Moderately severe obstruction. I've asked him to start taking symbicort every day.

## 2013-05-12 NOTE — Assessment & Plan Note (Signed)
precontemplative now.  Prior quit but didn't stick

## 2013-05-12 NOTE — Assessment & Plan Note (Signed)
Again an issue. linzess significantly helped in the past. Recommended MOM regular for next 5 days then continue linzess daily.

## 2013-05-12 NOTE — Assessment & Plan Note (Signed)
Weight gain noted. Discussed LRCT - as weight gain noted, pt desires to continue to monitor weight, will notify us if weight loss noted for CT scan. CXR 02/2013 with emphysema but no nodules or masses.

## 2013-05-12 NOTE — Patient Instructions (Addendum)
Let's start symbicort daily - you have sample at home and I have sent in prescription to your pharmacy. For constipation - restart milk of magnesia and start linzess daily.  linzess will need to be a chronic medicine - price it out at pharmacy (sent to pharmacy) Keep an eye on weights and let me know if dropping. Keep thinking about cutting back. Return at your convenience fasting for blood work and afterwards for medicare wellness visit  Constipation, Adult Constipation is when a person has fewer than 3 bowel movements a week; has difficulty having a bowel movement; or has stools that are dry, hard, or larger than normal. As people grow older, constipation is more common. If you try to fix constipation with medicines that make you have a bowel movement (laxatives), the problem may get worse. Long-term laxative use may cause the muscles of the colon to become weak. A low-fiber diet, not taking in enough fluids, and taking certain medicines may make constipation worse. CAUSES   Certain medicines, such as antidepressants, pain medicine, iron supplements, antacids, and water pills.   Certain diseases, such as diabetes, irritable bowel syndrome (IBS), thyroid disease, or depression.   Not drinking enough water.   Not eating enough fiber-rich foods.   Stress or travel.  Lack of physical activity or exercise.  Not going to the restroom when there is the urge to have a bowel movement.  Ignoring the urge to have a bowel movement.  Using laxatives too much. SYMPTOMS   Having fewer than 3 bowel movements a week.   Straining to have a bowel movement.   Having hard, dry, or larger than normal stools.   Feeling full or bloated.   Pain in the lower abdomen.  Not feeling relief after having a bowel movement. DIAGNOSIS  Your caregiver will take a medical history and perform a physical exam. Further testing may be done for severe constipation. Some tests may include:   A barium  enema X-ray to examine your rectum, colon, and sometimes, your small intestine.  A sigmoidoscopy to examine your lower colon.  A colonoscopy to examine your entire colon. TREATMENT  Treatment will depend on the severity of your constipation and what is causing it. Some dietary treatments include drinking more fluids and eating more fiber-rich foods. Lifestyle treatments may include regular exercise. If these diet and lifestyle recommendations do not help, your caregiver may recommend taking over-the-counter laxative medicines to help you have bowel movements. Prescription medicines may be prescribed if over-the-counter medicines do not work.  HOME CARE INSTRUCTIONS   Increase dietary fiber in your diet, such as fruits, vegetables, whole grains, and beans. Limit high-fat and processed sugars in your diet, such as [place] fries, hamburgers, cookies, candies, and soda.   A fiber supplement may be added to your diet if you cannot get enough fiber from foods.   Drink enough fluids to keep your urine clear or pale yellow.   Exercise regularly or as directed by your caregiver.   Go to the restroom when you have the urge to go. Do not hold it.  Only take medicines as directed by your caregiver. Do not take other medicines for constipation without talking to your caregiver first. SEEK IMMEDIATE MEDICAL CARE IF:   You have bright red blood in your stool.   Your constipation lasts for more than 4 days or gets worse.   You have abdominal or rectal pain.   You have thin, pencil-like stools.  You have unexplained weight loss.  MAKE SURE YOU:   Understand these instructions.  Will watch your condition.  Will get help right away if you are not doing well or get worse. Document Released: 05/31/2004 Document Revised: 11/25/2011 Document Reviewed: 08/06/2011 [facility] Patient Information 2014 [place], [place].

## 2013-05-12 NOTE — Progress Notes (Signed)
  Subjective:    Patient ID: Troy [name], male    DOB: 15-Feb-1933, 79 y.o.   MRN: [id number]  HPI CC: f/u weight loss  Seen here last month with concern for noted weight loss - but now has gained 4 lbs in last month.  Did not start glucerna. Wt Readings from Last 3 Encounters:  05/12/13 162 lb 8 oz (73.71 kg)  04/07/13 158 lb 8 oz (71.895 kg)  03/29/13 162 lb 12 oz (73.823 kg)  Noted 20 lb weight loss over last several years. Denies change in appetite. Eating 2-3 meals/day. No fevers, night sweats. Fatigue started with acute infection.  In our chart only about 15lbs.   COPD - has not been taking symbicort regularly.  Endorses no difficulty breathing or dyspnea.  No wheezing or cough.  Has recovered from recent COPD exac.  Continues battling with chronic constipation - last BM was 4 d ago.  Drinking good water.  Unsure about fiber.  Stopped MOM - ineffective.  Prior on amitiza and linzess which seemed to help.  Open to trying this again.   Smoker - 3/4 ppd.  precontemplative.   Past Medical History  Diagnosis Date  . Coronary atherosclerosis of artery bypass graft   . HLD (hyperlipidemia)   . Tobacco use disorder   . Hypertrophy of prostate without urinary obstruction and other lower urinary tract symptoms (LUTS)   . Diabetes mellitus type II ~2008  . Gastrointestinal ulcer 04/2011    with hematemesis s/p EGD by [facility]  . Prostate nodule ~2009    Left-benign s/p eval Uro WNL (Ottelin)  . Chronic obstructive pulmonary disease (COPD)     spirometry with moderate COPD  . Barrett's esophagus     EGD 2012, rpt 2014, longterm PPI  . Thrombocytopenia   . HNP (herniated nucleus pulposus), lumbar 2013    with compression of L5 nerve root and foot drop, also with lumbar DDD s/p surgery  . GERD (gastroesophageal reflux disease)   . Paroxysmal SVT (supraventricular tachycardia)     a. 3/14 => converted in ED with adenosine to AFib => NSR;  b. Echo 4/14:  Mild LVH, mild FBSH, EF 55-60%,  Gr 1 DD     Review of Systems Per HPI    Objective:   Physical Exam  Nursing note and vitals reviewed. Constitutional: He appears well-developed and well-nourished. No distress.  HENT:  Mouth/Throat: Oropharynx is clear and moist. No oropharyngeal exudate.  Cardiovascular: Normal rate, regular rhythm, normal heart sounds and intact distal pulses.   No murmur heard. Pulmonary/Chest: Effort normal and breath sounds normal. No respiratory distress. He has no wheezes. He has no rales.       Assessment & Plan:

## 2013-06-03 ENCOUNTER — Encounter: Payer: Self-pay | Admitting: Internal Medicine

## 2013-06-03 ENCOUNTER — Ambulatory Visit (INDEPENDENT_AMBULATORY_CARE_PROVIDER_SITE_OTHER): Payer: Medicare Other | Admitting: Internal Medicine

## 2013-06-03 VITALS — BP 116/60 | HR 58 | Temp 97.6°F | Resp 18 | Wt 163.0 lb

## 2013-06-03 DIAGNOSIS — J069 Acute upper respiratory infection, unspecified: Secondary | ICD-10-CM | POA: Insufficient documentation

## 2013-06-03 MED ORDER — AMOXICILLIN 500 MG PO TABS: 1000.0000 mg | ORAL_TABLET | Freq: Two times a day (BID) | ORAL | Status: DC

## 2013-06-03 NOTE — Assessment & Plan Note (Signed)
Discussed viral etiology and supportive care Urged him to stop smoking but he is unsure Will give Rx for amoxil for if he worsens

## 2013-06-03 NOTE — Progress Notes (Signed)
Subjective:    Patient ID: Troy [name], male    DOB: [birth year], 79 y.o.   MRN: [id number]  HPI "I feel bad" Feels weak, achy behind eyes, coughing Started with rhinorrhea 3 days ago or so Cough is worse upon lying down at night A little sputum  Slight SOB--no real change No fever Did have sweat at night but no clear chills Some sore throat No ear pain  Tried tylenol--helps a little  Current Outpatient Prescriptions on File Prior to Visit  Medication Sig Dispense Refill  . albuterol (PROVENTIL HFA;VENTOLIN HFA) 108 (90 BASE) MCG/ACT inhaler Inhale 2 puffs into the lungs every 4 (four) hours as needed. For shortness or breath, wheezing  1 Inhaler  11  . aspirin 81 MG tablet Take 81 mg by mouth daily.       . budesonide-formoterol (SYMBICORT) 160-4.5 MCG/ACT inhaler Inhale 2 puffs into the lungs daily.      . isosorbide mononitrate (IMDUR) 30 MG 24 hr tablet Take 0.5 tablets (15 mg total) by mouth daily.  45 tablet  3  . Linaclotide (LINZESS) 145 MCG CAPS capsule Take 1 capsule (145 mcg total) by mouth daily.  30 capsule  6  . magnesium hydroxide (MILK OF MAGNESIA) 400 MG/5ML suspension Take 15 mLs by mouth daily as needed for constipation.  360 mL  0  . metFORMIN (GLUCOPHAGE) 500 MG tablet Take 1 tablet (500 mg total) by mouth 2 (two) times daily with a meal.  180 tablet  3  . metoprolol (LOPRESSOR) 25 MG tablet Take 1 tablet (25 mg total) by mouth 2 (two) times daily.  180 tablet  3  . nitroGLYCERIN (NITROSTAT) 0.4 MG SL tablet Place 1 tablet (0.4 mg total) under the tongue every 5 (five) minutes as needed. For chest pain-may repeat x3  30 tablet  3  . omeprazole (PRILOSEC) 40 MG capsule Take 1 capsule (40 mg total) by mouth daily.  90 capsule  3  . simvastatin (ZOCOR) 40 MG tablet Take 1 tablet (40 mg total) by mouth at bedtime.  90 tablet  3   No current facility-administered medications on file prior to visit.    Allergies  Allergen Reactions  . Codeine     REACTION:  vomiting  . Cyclobenzaprine Hcl     REACTION: vomiting  . Hydrocodone Nausea Only and Other (See Comments)    "crazy"  . Tramadol Other (See Comments)    woozy  . Valium Other (See Comments)    sedation    Past Medical History  Diagnosis Date  . Coronary atherosclerosis of artery bypass graft   . HLD (hyperlipidemia)   . Tobacco use disorder   . Hypertrophy of prostate without urinary obstruction and other lower urinary tract symptoms (LUTS)   . Diabetes mellitus type II ~2008  . Gastrointestinal ulcer 04/2011    with hematemesis s/p EGD by [facility]  . Prostate nodule ~2009    Left-benign s/p eval Uro WNL (Ottelin)  . Chronic obstructive pulmonary disease (COPD)     spirometry with moderate COPD  . Barrett's esophagus     EGD 2012, rpt 2014, longterm PPI  . Thrombocytopenia   . HNP (herniated nucleus pulposus), lumbar 2013    with compression of L5 nerve root and foot drop, also with lumbar DDD s/p surgery  . GERD (gastroesophageal reflux disease)   . Paroxysmal SVT (supraventricular tachycardia)     a. 3/14 => converted in ED with adenosine to AFib => NSR;  b. Echo  4/14:  Mild LVH, mild FBSH, EF 55-60%, Gr 1 DD    Past Surgical History  Procedure Laterality Date  . Coronary artery bypass graft  1999    Dr. Cornelius Moras  . Hemorrhoid surgery    . Nose surgery    . Rotator cuff repair  04/2010    right (but has bilat) Dr. Yisroel Ramming ([facility])  . Colonoscopy  03/2009    small hemorrhoids, 2 polyps (hyperplastic and adenomatous), rpt 5 yrs (Dr. Maryjane Hurter)  . Esophagogastroduodenoscopy  05/16/2011    after hematemesis, no lesions found, consistent with barrett's esophagus rpt 2 yrs  . Lumbar laminectomy/decompression microdiscectomy  01/31/2012    Procedure: LUMBAR LAMINECTOMY/DECOMPRESSION MICRODISCECTOMY 1 LEVEL;  Surgeon: Carmela Hurt, MD;  Location: [facility];  Service: Neurosurgery;  Laterality: Left;  LEFT Lumbar Four-Five Diskectomy    Family History  Problem Relation  Age of Onset  . Cancer Father 61    colon    History   Social History  . Marital Status: Married    Spouse Name: N/A    Number of Children: N/A  . Years of Education: N/A   Occupational History  . Retired from [employer]     Social History Main Topics  . Smoking status: Current Every Day Smoker -- 0.50 packs/day for 60 years    Types: Cigarettes    Start date: 09/16/1952  . Smokeless tobacco: Current User    Types: Snuff     Comment: < 1 ppd; does dip sometimes  . Alcohol Use: Yes     Comment: Seldom  . Drug Use: No  . Sexual Activity: Not on file   Other Topics Concern  . Not on file   Social History Narrative   Lives with wife; No pets   He is very active   He was in the [employer] for 4 years. He gets meds from the [place]   Activity: walking daily since back surgery   Diet: good water, not much fruits/vegetables   Review of Systems No rash  No vomiting or diarrhea Appetite is okay     Objective:   Physical Exam  Constitutional: He appears well-developed and well-nourished. No distress.  HENT:  Head: Normocephalic and atraumatic.  Slight pharyngeal injection TMs normal  Moderate mostly pale nasal congestion  Neck: Normal range of motion. Neck supple. No thyromegaly present.  Pulmonary/Chest: Effort normal and breath sounds normal. No respiratory distress. He has no wheezes. He has no rales.  Lymphadenopathy:    He has no cervical adenopathy.          Assessment & Plan:

## 2013-06-03 NOTE — Patient Instructions (Signed)
Continue tylenol and you can use over the counter cough med or honey for the cough. If you worsen with fever, more frequent cough or increased nasty mucus, go ahead and start the antibiotic.

## 2013-06-16 HISTORY — PX: ESOPHAGOGASTRODUODENOSCOPY: SHX1529

## 2013-06-21 ENCOUNTER — Other Ambulatory Visit: Payer: Self-pay | Admitting: *Deleted

## 2013-06-21 MED ORDER — SIMVASTATIN 40 MG PO TABS: 40.0000 mg | ORAL_TABLET | Freq: Every day | ORAL | Status: DC

## 2013-06-21 MED ORDER — METOPROLOL TARTRATE 25 MG PO TABS: 25.0000 mg | ORAL_TABLET | Freq: Two times a day (BID) | ORAL | Status: DC

## 2013-06-21 MED ORDER — METFORMIN HCL 500 MG PO TABS: 500.0000 mg | ORAL_TABLET | Freq: Two times a day (BID) | ORAL | Status: DC

## 2013-06-21 MED ORDER — ISOSORBIDE MONONITRATE ER 30 MG PO TB24: 15.0000 mg | ORAL_TABLET | Freq: Every day | ORAL | Status: DC

## 2013-06-21 MED ORDER — OMEPRAZOLE 40 MG PO CPDR: 40.0000 mg | DELAYED_RELEASE_CAPSULE | Freq: Every day | ORAL | Status: DC

## 2013-06-24 ENCOUNTER — Telehealth: Payer: Self-pay

## 2013-06-24 NOTE — Telephone Encounter (Signed)
Attempted to call [facility]. Spoke with pharmacist and she couldn't advise anything about this. She said someone would call back if it was imperative for the patient and for now to disregard the call.

## 2013-06-24 NOTE — Telephone Encounter (Signed)
[facility] pharmacist (no name left) called to check on status of fax sent about guidelines from [employer] that suggest pt take ace inhibitor, lisinopril or losartan which is covered by pt's plan. [facility] request form faxed back to [phone].

## 2013-06-24 NOTE — Telephone Encounter (Signed)
Will discuss at pt's next appt plz have them fax another form.

## 2013-06-24 NOTE — Telephone Encounter (Signed)
I haven't seen this form

## 2013-06-24 NOTE — Telephone Encounter (Deleted)
I haven't seen this form. Did they happen to leave a call back #?

## 2013-07-06 ENCOUNTER — Other Ambulatory Visit: Payer: Self-pay | Admitting: Gastroenterology

## 2013-08-22 ENCOUNTER — Other Ambulatory Visit: Payer: Self-pay | Admitting: Family Medicine

## 2013-08-22 DIAGNOSIS — E7800 Pure hypercholesterolemia, unspecified: Secondary | ICD-10-CM

## 2013-08-26 ENCOUNTER — Other Ambulatory Visit (INDEPENDENT_AMBULATORY_CARE_PROVIDER_SITE_OTHER): Payer: Medicare Other

## 2013-08-26 DIAGNOSIS — E7800 Pure hypercholesterolemia, unspecified: Secondary | ICD-10-CM

## 2013-08-26 LAB — LDL CHOLESTEROL, DIRECT: Direct LDL: 83.7 mg/dL

## 2013-08-31 ENCOUNTER — Encounter: Payer: Self-pay | Admitting: *Deleted

## 2013-09-21 ENCOUNTER — Encounter: Payer: Self-pay | Admitting: Family Medicine

## 2014-03-02 ENCOUNTER — Encounter (INDEPENDENT_AMBULATORY_CARE_PROVIDER_SITE_OTHER): Payer: Self-pay

## 2014-03-02 ENCOUNTER — Ambulatory Visit (INDEPENDENT_AMBULATORY_CARE_PROVIDER_SITE_OTHER)
Admission: RE | Admit: 2014-03-02 | Discharge: 2014-03-02 | Disposition: A | Discharge status: Home / Self Care | Payer: Medicare Other | Source: Ambulatory Visit | Attending: Family Medicine | Admitting: Family Medicine

## 2014-03-02 ENCOUNTER — Encounter: Payer: Self-pay | Admitting: Family Medicine

## 2014-03-02 ENCOUNTER — Ambulatory Visit (INDEPENDENT_AMBULATORY_CARE_PROVIDER_SITE_OTHER): Payer: Medicare Other | Admitting: Family Medicine

## 2014-03-02 VITALS — BP 110/60 | HR 57 | Temp 98.3°F | Wt 168.0 lb

## 2014-03-02 DIAGNOSIS — M545 Low back pain, unspecified: Secondary | ICD-10-CM

## 2014-03-02 DIAGNOSIS — F17200 Nicotine dependence, unspecified, uncomplicated: Secondary | ICD-10-CM

## 2014-03-02 DIAGNOSIS — R0989 Other specified symptoms and signs involving the circulatory and respiratory systems: Secondary | ICD-10-CM

## 2014-03-02 NOTE — Progress Notes (Signed)
Pre visit review using our clinic review tool, if applicable. No additional management support is needed unless otherwise documented below in the visit note. 

## 2014-03-02 NOTE — Progress Notes (Signed)
[place] [address] Phone: [phone] Fax: [phone]  Patient ID: Troy [name] MRN: [id number], DOB: 05/02/1933, 80 y.o. Date of Encounter: 03/02/2014  Primary Physician:  [name], MD   Chief Complaint: Back Pain and Constipation   Subjective:   History of Present Illness:  Troy [name] is a 80 y.o. very pleasant male patient who presents with the following:  The patient presents with what he describes as "kidney pain" but when I have him localize this is actually in the lumbar region, more around L4-5 and focally in the muscle. He also is having some pain in the SI joint. He is not having any sort of pain up and around his kidney region, and he is not having any dysuria urgency. He also denies any sore hematuria.   Pain in his back and certain ways it will hut. 2-3 weeks.  No trauma. Won't take miralax. (Took once.) he says that he feels a little bit bloated, but when I ask him or valgus, he says he has nice well-formed stool without hardness that he is able to achieve about every day.  Straining.  CXR.  Patient Active Problem List   Diagnosis Date Noted  . Acute upper respiratory infections of unspecified site 06/03/2013  . Chest wall pain 03/12/2013  . Supraventricular tachycardia 11/24/2012  . History of coronary artery disease 11/24/2012  . Loss of weight 10/26/2012  . Lumbosacral radiculopathy at L5 12/23/2011  . Thrombocytopenia   . Barrett's esophagus   . Skin rash 09/16/2011  . PUD (peptic ulcer disease) 05/16/2011  . Medicare annual wellness visit, subsequent 04/30/2011  . COPD (chronic obstructive pulmonary disease) 12/26/2010  . OTHER CHEST PAIN 10/04/2010  . DIABETES MELLITUS, TYPE II 08/14/2010  . Chronic idiopathic constipation 07/17/2010  . HYPERCHOLESTEROLEMIA 05/13/2009  . Smoker 05/13/2009    Past Medical History  Diagnosis Date  . Coronary atherosclerosis of artery bypass graft   . HLD (hyperlipidemia)     . Tobacco use disorder   . Hypertrophy of prostate without urinary obstruction and other lower urinary tract symptoms (LUTS)   . Diabetes mellitus type II ~2008  . Gastrointestinal ulcer 04/2011    with hematemesis s/p EGD by [facility]  . Prostate nodule ~2009    Left-benign s/p eval Uro WNL (Ottelin)  . Chronic obstructive pulmonary disease (COPD)     spirometry with moderate COPD  . Barrett's esophagus     EGD 2012, rpt 2014, longterm PPI  . Thrombocytopenia   . HNP (herniated nucleus pulposus), lumbar 2013    with compression of L5 nerve root and foot drop, also with lumbar DDD s/p surgery  . GERD (gastroesophageal reflux disease)   . Paroxysmal SVT (supraventricular tachycardia)     a. 3/14 => converted in ED with adenosine to AFib => NSR;  b. Echo 4/14:  Mild LVH, mild FBSH, EF 55-60%, Gr 1 DD    Past Surgical History  Procedure Laterality Date  . Coronary artery bypass graft  1999    Dr. Roxy Manns  . Hemorrhoid surgery    . Nose surgery    . Rotator cuff repair  04/2010    right (but has bilat) Dr. Latanya Maudlin ([facility])  . Colonoscopy  03/2009    small hemorrhoids, 2 polyps (hyperplastic and adenomatous), rpt 5 yrs (Dr. Barron Schmid)  . Esophagogastroduodenoscopy  05/16/2011    after hematemesis, no lesions found, consistent with barrett's esophagus rpt 2 yrs  . Lumbar laminectomy/decompression microdiscectomy  01/31/2012    Procedure: LUMBAR LAMINECTOMY/DECOMPRESSION MICRODISCECTOMY 1 LEVEL;  Surgeon: Winfield Cunas, MD;  Location: [facility];  Service: Neurosurgery;  Laterality: Left;  LEFT Lumbar Four-Five Diskectomy  . Esophagogastroduodenoscopy  06/2013    barrett's, small HH, recheck 2-3 yrs ([facility])    History   Social History  . Marital Status: Married    Spouse Name: N/A    Number of Children: N/A  . Years of Education: N/A   Occupational History  . Retired from [employer]    Social History Main Topics  . Smoking status: Current Every Day Smoker -- 0.50  packs/day for 60 years    Types: Cigarettes    Start date: 09/16/1952  . Smokeless tobacco: Current User    Types: Snuff     Comment: < 1 ppd; does dip sometimes  . Alcohol Use: Yes     Comment: Seldom  . Drug Use: No  . Sexual Activity: Not on file   Other Topics Concern  . Not on file   Social History Narrative   Lives with wife; No pets   He is very active   He was in the [employer] for 4 years. He gets meds from the [place]   Activity: walking daily since back surgery   Diet: good water, not much fruits/vegetables    Family History  Problem Relation Age of Onset  . Cancer Father 82    colon    Allergies  Allergen Reactions  . Codeine     REACTION: vomiting  . Cyclobenzaprine Hcl     REACTION: vomiting  . Hydrocodone Nausea Only and Other (See Comments)    "crazy"  . Tramadol Other (See Comments)    woozy  . Valium Other (See Comments)    sedation    Medication list reviewed and updated in full in [place].   Past Medical History, Surgical History, Social History, Family History, Problem List, Medications, and Allergies have been reviewed and updated if relevant.  Review of Systems:  GEN: No acute illnesses, no fevers, chills. GI: No n/v/d, eating normally Pulm: No SOB Interactive and getting along well at home.  Otherwise, ROS is as per the HPI.  Objective:   Physical Examination: BP 110/60  Pulse 57  Temp(Src) 98.3 F (36.8 C) (Tympanic)  Wt 168 lb (76.204 kg)  SpO2 96%   GEN: WDWN, NAD, Non-toxic, A & O x 3 HEENT: Atraumatic, Normocephalic. Neck supple. No masses, No LAD. Ears and Nose: No external deformity. CV: RRR, No M/G/R. No JVD. No thrill. No extra heart sounds. PULM: CTA B, Crackles RLL No resp. distress. No accessory muscle use. EXTR: No c/c/e NEURO Normal gait.  PSYCH: Normally interactive. Conversant. Not depressed or anxious appearing.  Calm demeanor.   Mild tenderness is present around L4-5 and the erector spinae complex  without tenderness on spinous processes. There is no CVA tenderness. There is also some mild SI joint tenderness on the RIGHT. He also has some pain around his oblique region on the RIGHT as well. No pain around the rectus or in the ribs.  Laboratory and Imaging Data: Dg Chest 2 View  03/02/2014   CLINICAL DATA:  Abnormal right lower lobe exam. No clinical pneumonia. 60+ pack year history. 03/11/2013  EXAM: CHEST  2 VIEW  COMPARISON:  None.  FINDINGS: Patient has had median sternotomy and CABG. Heart is normal in size. Lungs are hyperinflated. There are no focal consolidations for pleural effusions. No pulmonary edema.  IMPRESSION: 1. Hyperinflation. 2.  No evidence for acute  abnormality.   Electronically Signed   By: Shon Hale [name]   On: 03/02/2014 15:19    Assessment & Plan:   Lumbago  Smoker - Plan: DG Chest 2 View  Lung crackles - Plan: DG Chest 2 View  Check a chest x-ray, mostly because he is a longtime smoker and his lung exam is abnormal. Reassuring. He is basically completely noncompliant with his inhaled medication for chronic obstructive pulmonary disease.  Low back pain, certainly he would have some degenerative disc disease, and I would just start with some basic conservative care at this point.  Patient Instructions  Ibuprofen 2 tablets (400 mg) three times a day Take Tylenol/Acetaminophen ES (500mg ) 2 tabs by mouth three times a day max as needed.   Back pain:  Suggest moist heat: like a low level heating pad You can also use a water bottle Or a warm moist towel  A warm bath can often help Or a warm shower.  Massage is very helpful with back pain associated with muscle pain. If you have someone who lives with you who could give you a back massage, it would be a good idea.      Orders Placed This Encounter  Procedures  . DG Chest 2 View   Follow-up: No Follow-up on file. Unless noted above, the patient is to follow-up if symptoms worsen. Red flags were  reviewed with the patient.  Signed,  Maud Deed. Copland, MD, CAQ Sports Medicine   Discontinued Medications   ALBUTEROL (PROVENTIL HFA;VENTOLIN HFA) 108 (90 BASE) MCG/ACT INHALER    Inhale 2 puffs into the lungs every 4 (four) hours as needed. For shortness or breath, wheezing   AMOXICILLIN (AMOXIL) 500 MG TABLET    Take 2 tablets (1,000 mg total) by mouth 2 (two) times daily.   BUDESONIDE-FORMOTEROL (SYMBICORT) 160-4.5 MCG/ACT INHALER    Inhale 2 puffs into the lungs daily.   LINACLOTIDE (LINZESS) 145 MCG CAPS CAPSULE    Take 1 capsule (145 mcg total) by mouth daily.   MAGNESIUM HYDROXIDE (MILK OF MAGNESIA) 400 MG/5ML SUSPENSION    Take 15 mLs by mouth daily as needed for constipation.   Current Medications at Discharge:   Medication List       This list is accurate as of: 03/02/14 11:59 PM.  Always use your most recent med list.               aspirin 81 MG tablet  Take 81 mg by mouth daily.     isosorbide mononitrate 30 MG 24 hr tablet  Commonly known as:  IMDUR  Take 0.5 tablets (15 mg total) by mouth daily.     metFORMIN 500 MG tablet  Commonly known as:  GLUCOPHAGE  Take 1 tablet (500 mg total) by mouth 2 (two) times daily with a meal.     metoprolol tartrate 25 MG tablet  Commonly known as:  LOPRESSOR  Take 1 tablet (25 mg total) by mouth 2 (two) times daily.     nitroGLYCERIN 0.4 MG SL tablet  Commonly known as:  NITROSTAT  Place 1 tablet (0.4 mg total) under the tongue every 5 (five) minutes as needed. For chest pain-may repeat x3     omeprazole 40 MG capsule  Commonly known as:  PRILOSEC  Take 1 capsule (40 mg total) by mouth daily.     simvastatin 40 MG tablet  Commonly known as:  ZOCOR  Take 1 tablet (  40 mg total) by mouth at bedtime.

## 2014-03-02 NOTE — Patient Instructions (Addendum)
Ibuprofen 2 tablets (400 mg) three times a day Take Tylenol/Acetaminophen ES (500mg ) 2 tabs by mouth three times a day max as needed.   Back pain:  Suggest moist heat: like a low level heating pad You can also use a water bottle Or a warm moist towel  A warm bath can often help Or a warm shower.  Massage is very helpful with back pain associated with muscle pain. If you have someone who lives with you who could give you a back massage, it would be a good idea.

## 2014-03-03 ENCOUNTER — Other Ambulatory Visit: Payer: Self-pay | Admitting: Family Medicine

## 2014-03-03 ENCOUNTER — Encounter: Payer: Self-pay | Admitting: *Deleted

## 2014-03-28 ENCOUNTER — Ambulatory Visit (INDEPENDENT_AMBULATORY_CARE_PROVIDER_SITE_OTHER): Payer: Medicare Other | Admitting: Family Medicine

## 2014-03-28 ENCOUNTER — Encounter: Payer: Self-pay | Admitting: Family Medicine

## 2014-03-28 VITALS — BP 128/60 | HR 60 | Temp 97.7°F | Wt 167.2 lb

## 2014-03-28 DIAGNOSIS — F17200 Nicotine dependence, unspecified, uncomplicated: Secondary | ICD-10-CM

## 2014-03-28 DIAGNOSIS — M545 Low back pain, unspecified: Secondary | ICD-10-CM

## 2014-03-28 DIAGNOSIS — K5904 Chronic idiopathic constipation: Secondary | ICD-10-CM

## 2014-03-28 DIAGNOSIS — K5900 Constipation, unspecified: Secondary | ICD-10-CM

## 2014-03-28 DIAGNOSIS — M549 Dorsalgia, unspecified: Secondary | ICD-10-CM

## 2014-03-28 LAB — POCT URINALYSIS DIPSTICK
BILIRUBIN UA: NEGATIVE
Glucose, UA: NEGATIVE
KETONES UA: NEGATIVE
Leukocytes, UA: NEGATIVE
Nitrite, UA: NEGATIVE
PH UA: 6
PROTEIN UA: NEGATIVE
RBC UA: NEGATIVE
SPEC GRAV UA: 1.02
Urobilinogen, UA: 0.2

## 2014-03-28 MED ORDER — POLYETHYLENE GLYCOL 3350 17 GM/SCOOP PO POWD: 17.0000 g | Freq: Every day | ORAL | Status: DC

## 2014-03-28 MED ORDER — LINACLOTIDE 145 MCG PO CAPS: 145.0000 ug | ORAL_CAPSULE | Freq: Every day | ORAL | Status: DC

## 2014-03-28 NOTE — Assessment & Plan Note (Signed)
precontemplative. 

## 2014-03-28 NOTE — Assessment & Plan Note (Signed)
Off his prior bowel regimen - rec restart linzess - pt will price out (sent to pharmacy) rec miralax 1 capful daily

## 2014-03-28 NOTE — Patient Instructions (Signed)
For constipation - restart linzess (price out) and miralax 1 capful in 8oz glass of water daily. For back pain - urine looking ok today. I do think you have arthritis of the back as well as muscle spasm on the right side. I want you to start tylenol ES 500mg  twice daily every day, may add on ibuprofen 400mg  with food as needed. Stretching exercises for lower back provided today. Start heating pad for back. Call me with an update in about 1 week's time

## 2014-03-28 NOTE — Progress Notes (Signed)
BP 128/60  Pulse 60  Temp(Src) 97.7 F (36.5 C) (Oral)  Wt 167 lb 4 oz (75.864 kg)   CC: back pain  Subjective:    Patient ID: Troy [name], male    DOB: 05-Feb-1933, 80 y.o.   MRN: [id number]  HPI: Troy [name] is a 80 y.o. male presenting on 03/28/2014 for Back Pain   Ongoing issues with R flank pain.  Ongoing over 1 month now. Dull ache, no radiation of pain. Worse with certain movements like transition from sitting to standing. No pain with laying flat or prolonged walking. Denies shooting pain down legs. No foot drop. Denies inciting trauma/falls. No fevers/chills.  Seen here by Dr. Lorelei Pont last month for similar back pain - at that time thought attributable to lumbar DDD and muscle spasm. Treated with ibuprofen 400mg  tid and ES Tylenol tid both prn.  Neither have helped.  HNP (herniated nucleus pulposus), lumbar Date: 2013 with compression of L5 nerve root and foot drop, also with lumbar DDD s/p surgery  Persistent smoker 1/2 to 3/4 ppd. Notes dyspnea on exertion.  Persistent constipation - states he needs suppository to stool. Last stool was yesterday, normal.  Relevant past medical, surgical, family and social history reviewed and updated as indicated.  Allergies and medications reviewed and updated. Current Outpatient Prescriptions on File Prior to Visit  Medication Sig  . aspirin 81 MG tablet Take 81 mg by mouth daily.   . isosorbide mononitrate (IMDUR) 30 MG 24 hr tablet Take 1/2 tablet (15 mg  total) by mouth daily.  . metFORMIN (GLUCOPHAGE) 500 MG tablet Take 1 tablet (500 mg  total) by mouth 2 (two)  times daily with a meal.  . metoprolol tartrate (LOPRESSOR) 25 MG tablet Take 1 tablet by mouth two  times daily  . nitroGLYCERIN (NITROSTAT) 0.4 MG SL tablet Place 1 tablet (0.4 mg total) under the tongue every 5 (five) minutes as needed. For chest pain-may repeat x3  . omeprazole (PRILOSEC) 40 MG capsule Take 1 capsule by mouth  daily  . simvastatin (ZOCOR) 40  MG tablet Take 1 tablet by mouth at  bedtime   No current facility-administered medications on file prior to visit.    Review of Systems Per HPI unless specifically indicated above    Objective:    BP 128/60  Pulse 60  Temp(Src) 97.7 F (36.5 C) (Oral)  Wt 167 lb 4 oz (75.864 kg)  Physical Exam  Nursing note and vitals reviewed. Constitutional: He appears well-developed and well-nourished. No distress.  Musculoskeletal: He exhibits no edema.  No pain midline spine + R paraspinous mm tenderness, tender to palpation R flank region Neg SLR on right No pain with int/ext rotation at hip. Neg FABER on right but tightness noted. No pain at SIJ bilaterally.  Neurological:  5/5 strength BLE Sensation intact   Results for orders placed in visit on 03/28/14  POCT URINALYSIS DIPSTICK      Result Value Ref Range   Color, UA Yellow     Clarity, UA Clear     Glucose, UA Negative     Bilirubin, UA Negative     Ketones, UA Negative     Spec Grav, UA 1.020     Blood, UA Negative     pH, UA 6.0     Protein, UA Negative     Urobilinogen, UA 0.2     Nitrite, UA Negative     Leukocytes, UA Negative  Assessment & Plan:   Problem List Items Addressed This Visit   Smoker     precontemplative.    Mid back pain on right side     Anticipate combination of known lumbar DDD + muscle spasm. Treat with tylenol 500mg  bid scheduled, adding ibuprofen 400mg  for breakthrough pain, rec heating pad and provided with exercises on lower back pain from [facility] pt advisor. Pt declines PT referral. Avoid stronger narcotic or muscle relaxant as he has been intolerant of several in the past.    Chronic idiopathic constipation     Off his prior bowel regimen - rec restart linzess - pt will price out (sent to pharmacy) rec miralax 1 capful daily    Relevant Medications      POLYETHYLENE GLYCOL 3350 17 GM PO POWD    Other Visit Diagnoses   Midline low back pain without sciatica    -  Primary     Relevant Orders       POCT Urinalysis Dipstick (Completed)        Follow up plan: Return if symptoms worsen or fail to improve.

## 2014-03-28 NOTE — Assessment & Plan Note (Signed)
Anticipate combination of known lumbar DDD + muscle spasm. Treat with tylenol 500mg  bid scheduled, adding ibuprofen 400mg  for breakthrough pain, rec heating pad and provided with exercises on lower back pain from [facility] pt advisor. Pt declines PT referral. Avoid stronger narcotic or muscle relaxant as he has been intolerant of several in the past.

## 2014-03-28 NOTE — Progress Notes (Signed)
Pre visit review using our clinic review tool, if applicable. No additional management support is needed unless otherwise documented below in the visit note. 

## 2014-04-04 ENCOUNTER — Telehealth: Payer: Self-pay

## 2014-04-04 NOTE — Telephone Encounter (Signed)
Patient notified and verbalized understanding. 

## 2014-04-04 NOTE — Telephone Encounter (Signed)
Let's hold linzess and continue miralax. May restart linzess prn constipation. If diarrhea persists on miralax alone let us know.

## 2014-04-04 NOTE — Telephone Encounter (Signed)
Pt left v/m; pt was seen on 03/28/14 and restarted Linzess and now pt has diarrhea. Every time pt takes Linzess pt has diarrhea for 3-4 hours. Pt is also taking Miralax once daily at the same time pt takes Linzess. No abd pain or fever. Pt wants to know what to do about diarrhea.[facility].

## 2014-07-26 ENCOUNTER — Encounter: Payer: Self-pay | Admitting: Family Medicine

## 2014-07-26 ENCOUNTER — Telehealth: Payer: Self-pay | Admitting: Family Medicine

## 2014-07-26 NOTE — Telephone Encounter (Signed)
FYI- Dr Perley Jain office called [place] to make [place] aware that they have been trying to contact the patient to schedule colonoscopy and when they finally got in touch with him, he notified them that he did not want to schedule at this time and would notify them when he was ready.

## 2014-07-26 NOTE — Telephone Encounter (Signed)
Noted  

## 2014-07-26 NOTE — Telephone Encounter (Signed)
Pt was on list to schedule a referral with Dr Watt Climes, they tried to contact him to schedule and he told them that he was not ready to schedule that and would call back when he was ready.

## 2014-08-01 ENCOUNTER — Encounter: Payer: Self-pay | Admitting: Family Medicine

## 2014-08-01 ENCOUNTER — Ambulatory Visit (INDEPENDENT_AMBULATORY_CARE_PROVIDER_SITE_OTHER): Payer: Medicare Other | Admitting: Family Medicine

## 2014-08-01 VITALS — BP 160/70 | HR 48 | Temp 97.5°F | Wt 170.5 lb

## 2014-08-01 DIAGNOSIS — Z720 Tobacco use: Secondary | ICD-10-CM

## 2014-08-01 DIAGNOSIS — R202 Paresthesia of skin: Secondary | ICD-10-CM

## 2014-08-01 DIAGNOSIS — R42 Dizziness and giddiness: Secondary | ICD-10-CM | POA: Insufficient documentation

## 2014-08-01 DIAGNOSIS — F17200 Nicotine dependence, unspecified, uncomplicated: Secondary | ICD-10-CM

## 2014-08-01 DIAGNOSIS — E118 Type 2 diabetes mellitus with unspecified complications: Secondary | ICD-10-CM

## 2014-08-01 DIAGNOSIS — E7800 Pure hypercholesterolemia, unspecified: Secondary | ICD-10-CM

## 2014-08-01 LAB — CBC WITH DIFFERENTIAL/PLATELET
BASOS PCT: 0.3 % (ref 0.0–3.0)
Basophils Absolute: 0 10*3/uL (ref 0.0–0.1)
EOS ABS: 0 10*3/uL (ref 0.0–0.7)
EOS PCT: 0.5 % (ref 0.0–5.0)
HEMATOCRIT: 38.9 % — AB (ref 39.0–52.0)
HEMOGLOBIN: 12.6 g/dL — AB (ref 13.0–17.0)
Lymphocytes Relative: 36.7 % (ref 12.0–46.0)
Lymphs Abs: 2.5 10*3/uL (ref 0.7–4.0)
MCHC: 32.3 g/dL (ref 30.0–36.0)
MCV: 90 fl (ref 78.0–100.0)
MONO ABS: 0.5 10*3/uL (ref 0.1–1.0)
Monocytes Relative: 7.8 % (ref 3.0–12.0)
NEUTROS ABS: 3.8 10*3/uL (ref 1.4–7.7)
NEUTROS PCT: 54.7 % (ref 43.0–77.0)
Platelets: 144 10*3/uL — ABNORMAL LOW (ref 150.0–400.0)
RBC: 4.33 Mil/uL (ref 4.22–5.81)
RDW: 14.7 % (ref 11.5–15.5)
WBC: 6.9 10*3/uL (ref 4.0–10.5)

## 2014-08-01 LAB — HEMOGLOBIN A1C: Hgb A1c MFr Bld: 6.5 % (ref 4.6–6.5)

## 2014-08-01 NOTE — Assessment & Plan Note (Signed)
Continue to encourage cessation. precontemplative. 

## 2014-08-01 NOTE — Assessment & Plan Note (Signed)
Discussed possible etiologies. Pt denies EtOH use. ?DM related vs vitamin deficiencies. No other stroke sxs today. Check folate and B12 today.

## 2014-08-01 NOTE — Patient Instructions (Signed)
Blood work today.  EKG today.  Decrease metoprolol to 12.5mg  twice daily (1/2 tablet twice daily).  You are overdue for appointment with cardiology - call to schedule.

## 2014-08-01 NOTE — Assessment & Plan Note (Signed)
Foot exam today. Check A1c today.

## 2014-08-01 NOTE — Progress Notes (Signed)
Pre visit review using our clinic review tool, if applicable. No additional management support is needed unless otherwise documented below in the visit note. 

## 2014-08-01 NOTE — Progress Notes (Addendum)
BP 160/70 mmHg  Pulse 48  Temp(Src) 97.5 F (36.4 C) (Oral)  Wt 170 lb 8 oz (77.338 kg)   CC: dizziness, foot paresthesias  Subjective:    Patient ID: Troy [name], male    DOB: 19-Jul-1933, 80 y.o.   MRN: [id number]  HPI: Troy [name] is a 80 y.o. male presenting on 08/01/2014 for Dizziness and Numbness   H/o CAD s/p CABG 1999, known diabetes type 2, COPD continued smoker.  Dizziness started 5d ago while blowing leaves in yard. Had a fall due to dizziness described as lightheadedness and imbalance. "Legs gave out" x3 in yard. No LOC, no injury. Mild headache. Denies chest pain, new dyspnea, palpitations. Did seem pale after falls on Wednesday. Staying dizzy with standing but not as bad as on Wednesday.  Foot numbness started after dizzy episodes on Wednesday. This has worsened as well. L>R. Denies foot weakness. No other body paresthesias. Denies slurred speech, confusion, or unilateral weakness.  HNP (herniated nucleus pulposus), lumbar Date: 2013 with compression of L5 nerve root and L foot drop, also with lumbar DDD s/p laminectomy/microdiscectomy  Smoking - <1 ppd.  Relevant past medical, surgical, family and social history reviewed and updated as indicated.  Allergies and medications reviewed and updated. Current Outpatient Prescriptions on File Prior to Visit  Medication Sig  . aspirin 81 MG tablet Take 81 mg by mouth daily.   . isosorbide mononitrate (IMDUR) 30 MG 24 hr tablet Take 1/2 tablet (15 mg  total) by mouth daily.  . metFORMIN (GLUCOPHAGE) 500 MG tablet Take 1 tablet (500 mg  total) by mouth 2 (two)  times daily with a meal.  . metoprolol tartrate (LOPRESSOR) 25 MG tablet Take 1 tablet by mouth two  times daily (Patient taking differently: Take 1/2 tablet by mouth two  times daily)  . nitroGLYCERIN (NITROSTAT) 0.4 MG SL tablet Place 1 tablet (0.4 mg total) under the tongue every 5 (five) minutes as needed. For chest pain-may repeat x3  . simvastatin  (ZOCOR) 40 MG tablet Take 1 tablet by mouth at  bedtime  . omeprazole (PRILOSEC) 40 MG capsule Take 1 capsule by mouth  daily  . polyethylene glycol powder (GLYCOLAX/MIRALAX) powder Take 17 g by mouth daily.   No current facility-administered medications on file prior to visit.   Past Medical History  Diagnosis Date  . Coronary atherosclerosis of artery bypass graft   . HLD (hyperlipidemia)   . Tobacco use disorder   . Hypertrophy of prostate without urinary obstruction and other lower urinary tract symptoms (LUTS)   . Diabetes mellitus type II ~2008  . Gastrointestinal ulcer 04/2011    with hematemesis s/p EGD by [facility]  . Prostate nodule ~2009    Left-benign s/p eval Uro WNL ([facility])  . Chronic obstructive pulmonary disease (COPD)     spirometry with moderate COPD  . Barrett's esophagus     EGD 2012, rpt 2014, longterm PPI  . Thrombocytopenia   . HNP (herniated nucleus pulposus), lumbar 2013    with compression of L5 nerve root and foot drop, also with lumbar DDD s/p surgery  . GERD (gastroesophageal reflux disease)   . Paroxysmal SVT (supraventricular tachycardia)     a. 3/14 => converted in ED with adenosine to AFib => NSR;  b. Echo 4/14:  Mild LVH, mild FBSH, EF 55-60%, Gr 1 DD    Past Surgical History  Procedure Laterality Date  . Coronary artery bypass graft  1999    Dr. Roxy Manns  .  Hemorrhoid surgery    . Nose surgery    . Rotator cuff repair  04/2010    right (but has bilat) Dr. Latanya Maudlin ([place])  . Colonoscopy  03/2009    small hemorrhoids, 2 polyps (hyperplastic and adenomatous), rpt 5 yrs (Dr. Barron Schmid) - pt declined rpt scheduling  . Esophagogastroduodenoscopy  05/16/2011    after hematemesis, no lesions found, consistent with barrett's esophagus rpt 2 yrs  . Lumbar laminectomy/decompression microdiscectomy  01/31/2012    Procedure: LUMBAR LAMINECTOMY/DECOMPRESSION MICRODISCECTOMY 1 LEVEL;  Surgeon: Winfield Cunas, MD;  Location: [place] [facility] OR[facility];  Service:  Neurosurgery;  Laterality: Left;  LEFT Lumbar Four-Five Diskectomy  . Esophagogastroduodenoscopy  06/2013    barrett's, small HH, recheck 2-3 yrs ([facility])    Review of Systems Per HPI unless specifically indicated above    Objective:    BP 160/70 mmHg  Pulse 48  Temp(Src) 97.5 F (36.4 C) (Oral)  Wt 170 lb 8 oz (77.338 kg)  Physical Exam  Constitutional: He appears well-developed and well-nourished. No distress.  HENT:  Mouth/Throat: Oropharynx is clear and moist. No oropharyngeal exudate.  Eyes: Conjunctivae and EOM are normal. Pupils are equal, round, and reactive to light. No scleral icterus.  Neck: Normal range of motion. Neck supple. Carotid bruit is not present.  Cardiovascular: Normal rate, regular rhythm, normal heart sounds and intact distal pulses.   No murmur heard. Pulmonary/Chest: Effort normal and breath sounds normal. No respiratory distress. He has no wheezes. He has no rales.  Musculoskeletal: He exhibits no edema.  Diabetic foot exam: Normal inspection - dry skin throughout No skin breakdown No calluses  Normal DP pulses Diminished sensation to light touch and monofilament throughout Nails normal  Lymphadenopathy:    He has no cervical adenopathy.  Neurological: He has normal strength. A sensory deficit is present. No cranial nerve deficit.  CN 2-12 intact FTN intact Diminished sensation bilateral feet  Skin: Skin is warm and dry. No rash noted.  Nursing note and vitals reviewed.   Orthostatic vital signs positive today - standing 160/70 HR 48, sitting 132/58 HR 52.    Assessment & Plan:   Problem List Items Addressed This Visit    Smoker    Continue to encourage cessation. precontemplative.    Paresthesia of foot, bilateral    Discussed possible etiologies. Pt denies EtOH use. ?DM related vs vitamin deficiencies. No other stroke sxs today. Check folate and B12 today.    Relevant Orders      TSH      Vitamin B12      Folate   Orthostatic  dizziness - Primary    New - check EKG given recent dizzy episodes and significant comorbidities. Anticipate related to bp meds given bradycardia noted on exam today as well. Decrease metoprolol to 12.5mg  bid. Discussed pt overdue for f/u with cardiology - will call to schedule.  EKG - sinus bradycardia with normal axis, intervals, no acute ST/T changes.    Relevant Orders      TSH      CBC with Differential      EKG 12-Lead (Completed)   HYPERCHOLESTEROLEMIA   Diabetes mellitus type 2 with complications    Foot exam today. Check A1c today.    Relevant Orders      Hemoglobin A1c      Renal function panel      HM DIABETES FOOT EXAM (Completed)       Follow up plan: Return in about 3 months (around 11/01/2014),  or if symptoms worsen or fail to improve, for follow up visit.

## 2014-08-01 NOTE — Assessment & Plan Note (Addendum)
New - check EKG given recent dizzy episodes and significant comorbidities. Anticipate related to bp meds given bradycardia noted on exam today as well. Decrease metoprolol to 12.5mg  bid. Discussed pt overdue for f/u with cardiology - will call to schedule.  EKG - sinus bradycardia with normal axis, intervals, no acute ST/T changes.

## 2014-08-02 LAB — RENAL FUNCTION PANEL
Albumin: 4.4 g/dL (ref 3.5–5.2)
BUN: 20 mg/dL (ref 6–23)
CALCIUM: 9.6 mg/dL (ref 8.4–10.5)
CHLORIDE: 109 meq/L (ref 96–112)
CO2: 20 meq/L (ref 19–32)
Creatinine, Ser: 1.2 mg/dL (ref 0.4–1.5)
GFR: 60.89 mL/min (ref >60.00)
Glucose, Bld: 96 mg/dL (ref 70–99)
Phosphorus: 3.6 mg/dL (ref 2.3–4.6)
Potassium: 4.6 mEq/L (ref 3.5–5.1)
Sodium: 141 mEq/L (ref 135–145)

## 2014-08-02 LAB — VITAMIN B12: Vitamin B-12: 210 pg/mL — ABNORMAL LOW (ref 211–911)

## 2014-08-02 LAB — TSH: TSH: 2.5 u[IU]/mL (ref 0.35–4.50)

## 2014-08-02 LAB — FOLATE: FOLATE: 17.8 ng/mL (ref >5.9)

## 2014-08-04 ENCOUNTER — Other Ambulatory Visit: Payer: Self-pay | Admitting: Family Medicine

## 2014-08-10 ENCOUNTER — Ambulatory Visit (INDEPENDENT_AMBULATORY_CARE_PROVIDER_SITE_OTHER): Payer: Medicare Other | Admitting: *Deleted

## 2014-08-10 DIAGNOSIS — E538 Deficiency of other specified B group vitamins: Secondary | ICD-10-CM

## 2014-08-10 MED ORDER — CYANOCOBALAMIN 1000 MCG/ML IJ SOLN
1000.0000 ug | Freq: Once | INTRAMUSCULAR | Status: AC
  Administered 2014-08-10: 1000 ug via INTRAMUSCULAR

## 2014-09-14 ENCOUNTER — Ambulatory Visit (INDEPENDENT_AMBULATORY_CARE_PROVIDER_SITE_OTHER): Payer: Medicare Other

## 2014-09-14 DIAGNOSIS — E538 Deficiency of other specified B group vitamins: Secondary | ICD-10-CM

## 2014-09-14 MED ORDER — CYANOCOBALAMIN 1000 MCG/ML IJ SOLN
1000.0000 ug | Freq: Once | INTRAMUSCULAR | Status: AC
  Administered 2014-09-14: 1000 ug via INTRAMUSCULAR

## 2014-09-27 ENCOUNTER — Telehealth: Payer: Self-pay | Admitting: *Deleted

## 2014-09-27 NOTE — Telephone Encounter (Signed)
Form for diabetic testing supplies in your IN box 

## 2014-09-28 NOTE — Telephone Encounter (Signed)
signed and in Kim's box. 

## 2014-09-28 NOTE — Telephone Encounter (Signed)
Form faxed

## 2014-10-12 ENCOUNTER — Ambulatory Visit (INDEPENDENT_AMBULATORY_CARE_PROVIDER_SITE_OTHER): Payer: Medicare Other | Admitting: *Deleted

## 2014-10-12 ENCOUNTER — Telehealth: Payer: Self-pay | Admitting: Family Medicine

## 2014-10-12 ENCOUNTER — Encounter: Payer: Self-pay | Admitting: Family Medicine

## 2014-10-12 DIAGNOSIS — D519 Vitamin B12 deficiency anemia, unspecified: Secondary | ICD-10-CM | POA: Diagnosis not present

## 2014-10-12 MED ORDER — CYANOCOBALAMIN 1000 MCG/ML IJ SOLN
1000.0000 ug | Freq: Once | INTRAMUSCULAR | Status: AC
  Administered 2014-10-12: 1000 ug via INTRAMUSCULAR

## 2014-10-12 NOTE — Telephone Encounter (Signed)
Troy [name] stated he was constipated  For a month or more Please advise what pt needs to do [facility]

## 2014-10-12 NOTE — Telephone Encounter (Signed)
Spoke with patient. He said he hasn't been taking the miralax every day, but most days. He refused linzess due to cost. He will try the miralax QD with Senokot Q3 days. He will call with an update next week and schedule appt if no improvement.

## 2014-10-12 NOTE — Telephone Encounter (Signed)
1 mo worsening constipation again. plz ensure he has been regular with miralax 17gm in 8 oz daily every day (had not been doing this previously). From prior phone note 03/2014 - pt stated linzess + miralax caused diarrhea so we just returned to miralax alone. Is he willing to retry linzess alone? If so, send in linzess 155mcg one daily, hold for diarrhea #30 with RF:3  If not willing, would have him continue miralax daily and add senokot OTC 1 pill every 3 days as stimulant laxative.  If not better with this schedule appt return to see me, bring all meds he takes and how he's been taking them to review.

## 2014-10-13 ENCOUNTER — Telehealth: Payer: Self-pay | Admitting: Family Medicine

## 2014-10-13 NOTE — Telephone Encounter (Signed)
emmi emailed °

## 2014-10-17 HISTORY — PX: COLONOSCOPY: SHX174

## 2014-11-01 ENCOUNTER — Ambulatory Visit (INDEPENDENT_AMBULATORY_CARE_PROVIDER_SITE_OTHER): Payer: Medicare Other | Admitting: Family Medicine

## 2014-11-01 ENCOUNTER — Encounter: Payer: Self-pay | Admitting: Family Medicine

## 2014-11-01 ENCOUNTER — Telehealth: Payer: Self-pay | Admitting: *Deleted

## 2014-11-01 ENCOUNTER — Ambulatory Visit (INDEPENDENT_AMBULATORY_CARE_PROVIDER_SITE_OTHER)
Admission: RE | Admit: 2014-11-01 | Discharge: 2014-11-01 | Disposition: A | Discharge status: Home / Self Care | Payer: Medicare Other | Source: Ambulatory Visit | Attending: Family Medicine | Admitting: Family Medicine

## 2014-11-01 VITALS — BP 128/60 | HR 62 | Temp 97.5°F | Wt 166.0 lb

## 2014-11-01 DIAGNOSIS — K5904 Chronic idiopathic constipation: Secondary | ICD-10-CM

## 2014-11-01 DIAGNOSIS — K6389 Other specified diseases of intestine: Secondary | ICD-10-CM | POA: Diagnosis not present

## 2014-11-01 DIAGNOSIS — I2510 Atherosclerotic heart disease of native coronary artery without angina pectoris: Secondary | ICD-10-CM | POA: Diagnosis not present

## 2014-11-01 DIAGNOSIS — Z720 Tobacco use: Secondary | ICD-10-CM

## 2014-11-01 DIAGNOSIS — K5900 Constipation, unspecified: Secondary | ICD-10-CM

## 2014-11-01 DIAGNOSIS — F17200 Nicotine dependence, unspecified, uncomplicated: Secondary | ICD-10-CM

## 2014-11-01 MED ORDER — LUBIPROSTONE 8 MCG PO CAPS: 8.0000 ug | ORAL_CAPSULE | Freq: Two times a day (BID) | ORAL | Status: DC

## 2014-11-01 MED ORDER — MAGNESIUM HYDROXIDE 400 MG/5ML PO SUSP: 15.0000 mL | Freq: Every day | ORAL | Status: DC | PRN

## 2014-11-01 NOTE — Assessment & Plan Note (Addendum)
Continue to encourage cessation - states down to 1/2 ppd.

## 2014-11-01 NOTE — Assessment & Plan Note (Addendum)
Chronic issue, failed linzess, miralax, amitiza, senakot and multiple other bowel regimens in the past.  Not consistent with obstruction, no abd pain.  Check abd series today. Increase miralax to 1.5 capful daily, take MOM 56mL x3 days then start amitiza 28mcg bid. Refer back to GI for further eval. Due for discussion of colonoscopy, prior had declined referral, now willing. Pt agrees with plan.

## 2014-11-01 NOTE — Progress Notes (Signed)
Pre visit review using our clinic review tool, if applicable. No additional management support is needed unless otherwise documented below in the visit note. 

## 2014-11-01 NOTE — Telephone Encounter (Signed)
Seen. Will d/c amitiza - and just use miralax and MOM while we schedule appt with [facility].

## 2014-11-01 NOTE — Patient Instructions (Addendum)
Xray of abdomen today.  For constipation let's increase miralax to 1.5 capfuls daily. Take milk of magnesia daily for 3 days for constipation. Then may start amitiza.  Continue to stay hydrated with plenty of water. Pass by Marion's office for referral to Dr Watt Climes to discuss bowels and possible colonoscopy.

## 2014-11-01 NOTE — Assessment & Plan Note (Signed)
Continue beta blocker and long acting nitrate

## 2014-11-01 NOTE — Progress Notes (Signed)
BP 128/60 mmHg  Pulse 62  Temp(Src) 97.5 F (36.4 C) (Oral)  Wt 166 lb (75.297 kg)   CC: 3 mo f/u visit  Subjective:    Patient ID: Troy [name], male    DOB: [birth year], 81 y.o.   MRN: [id number]  HPI: Troy [name] is a 81 y.o. male presenting on 11/01/2014 for Follow-up   Chronic constipation - linzess too expensive and caused diarrhea. On new regimen of miralax 17gm daily + senokot Q3d. Still having trouble without good stool. Episode last week of diarrhea and vomiting. Has 1 stool about once a week. None in past 6-7 days. Passes gas fine. Urinating well. No blood in stool. No abd pain or cramping.  States MOM, amitiza, benefiber, and miralax not effective.   Regularly eats 2 meals a day.   Smoker - down to 1/2 ppd  CAD - Compliant with current antihypertensive regimen of metoprolol 25mg  bid and imdur 15mg  daily.  Does not check blood pressures at home.  No low blood pressure readings or symptoms of dizziness/syncope.  Denies HA, vision changes, CP/tightness, SOB, leg swelling.   Lab Results  Component Value Date   HGBA1C 6.5 08/01/2014    Paresthesias from b12 deficiency - improving. Now taking b12 shot monthly.  Relevant past medical, surgical, family and social history reviewed and updated as indicated. Interim medical history since our last visit reviewed. Allergies and medications reviewed and updated. Current Outpatient Prescriptions on File Prior to Visit  Medication Sig  . aspirin 81 MG tablet Take 81 mg by mouth daily.   . cyanocobalamin (,VITAMIN B-12,) 1000 MCG/ML injection Inject 1 mL (1,000 mcg total) into the muscle every 30 (thirty) days. Start 07/2014  . isosorbide mononitrate (IMDUR) 30 MG 24 hr tablet Take 1/2 tablet (15 mg  total) by mouth daily.  . metFORMIN (GLUCOPHAGE) 500 MG tablet Take 1 tablet (500 mg  total) by mouth 2 (two)  times daily with a meal.  . metoprolol tartrate (LOPRESSOR) 25 MG tablet Take 1 tablet by mouth two  times daily  (Patient taking differently: Take 1/2 tablet by mouth two  times daily)  . nitroGLYCERIN (NITROSTAT) 0.4 MG SL tablet Place 1 tablet (0.4 mg total) under the tongue every 5 (five) minutes as needed. For chest pain-may repeat x3  . omeprazole (PRILOSEC) 40 MG capsule Take 1 capsule by mouth  daily  . polyethylene glycol powder (GLYCOLAX/MIRALAX) powder Take 17 g by mouth daily.  . simvastatin (ZOCOR) 40 MG tablet Take 1 tablet by mouth at  bedtime   No current facility-administered medications on file prior to visit.   Past Medical History  Diagnosis Date  . Coronary atherosclerosis of artery bypass graft   . HLD (hyperlipidemia)   . Tobacco use disorder   . Hypertrophy of prostate without urinary obstruction and other lower urinary tract symptoms (LUTS)   . Diabetes mellitus type II ~2008  . Gastrointestinal ulcer 04/2011    with hematemesis s/p EGD by [facility]  . Prostate nodule ~2009    Left-benign s/p eval Uro WNL ([facility])  . Chronic obstructive pulmonary disease (COPD)     spirometry with moderate COPD  . Barrett's esophagus     EGD 2012, rpt 2014, longterm PPI  . Thrombocytopenia   . HNP (herniated nucleus pulposus), lumbar 2013    with compression of L5 nerve root and foot drop, also with lumbar DDD s/p surgery  . GERD (gastroesophageal reflux disease)   . Paroxysmal SVT (supraventricular tachycardia)  a. 3/14 => converted in ED with adenosine to AFib => NSR;  b. Echo 4/14:  Mild LVH, mild FBSH, EF 55-60%, Gr 1 DD  . Chronic idiopathic constipation 07/17/2010    Past Surgical History  Procedure Laterality Date  . Coronary artery bypass graft  1999    Dr. Roxy Manns  . Hemorrhoid surgery    . Nose surgery    . Rotator cuff repair  04/2010    right (but has bilat) Dr. Latanya Maudlin ([place])  . Colonoscopy  03/2009    small hemorrhoids, 2 polyps (hyperplastic and adenomatous), rpt 5 yrs (Dr. Barron Schmid) - pt declined rpt scheduling  . Esophagogastroduodenoscopy  05/16/2011     after hematemesis, no lesions found, consistent with barrett's esophagus rpt 2 yrs  . Lumbar laminectomy/decompression microdiscectomy  01/31/2012    Procedure: LUMBAR LAMINECTOMY/DECOMPRESSION MICRODISCECTOMY 1 LEVEL;  Surgeon: Winfield Cunas, MD;  Location: [place] [facility] OR[facility];  Service: Neurosurgery;  Laterality: Left;  LEFT Lumbar Four-Five Diskectomy  . Esophagogastroduodenoscopy  06/2013    barrett's, small HH, recheck 2-3 yrs ([facility])   Review of Systems Per HPI unless specifically indicated above     Objective:    BP 128/60 mmHg  Pulse 62  Temp(Src) 97.5 F (36.4 C) (Oral)  Wt 166 lb (75.297 kg)  Wt Readings from Last 3 Encounters:  11/01/14 166 lb (75.297 kg)  08/01/14 170 lb 8 oz (77.338 kg)  03/28/14 167 lb 4 oz (75.864 kg)    Physical Exam  Constitutional: He appears well-developed and well-nourished. No distress.  HENT:  Mouth/Throat: Oropharynx is clear and moist. No oropharyngeal exudate.  Cardiovascular: Normal rate, regular rhythm, normal heart sounds and intact distal pulses.   No murmur heard. Pulmonary/Chest: Effort normal and breath sounds normal. No respiratory distress. He has no wheezes. He has no rales.  Abdominal: Soft. Normal appearance. He exhibits no distension and no mass. Bowel sounds are increased. There is no hepatosplenomegaly. There is no tenderness. There is no rigidity, no rebound, no guarding, no CVA tenderness and negative Murphy's sign.  Musculoskeletal: He exhibits no edema.  Skin: Skin is warm and dry. No rash noted.  Psychiatric: He has a normal mood and affect.  Nursing note and vitals reviewed.  Results for orders placed or performed in visit on 08/01/14  TSH  Result Value Ref Range   TSH 2.50 0.35 - 4.50 uIU/mL  Hemoglobin A1c  Result Value Ref Range   Hgb A1c MFr Bld 6.5 4.6 - 6.5 %  CBC with Differential  Result Value Ref Range   WBC 6.9 4.0 - 10.5 K/uL   RBC 4.33 4.22 - 5.81 Mil/uL   Hemoglobin 12.6 (L) 13.0 - 17.0 g/dL   HCT 38.9  (L) 39.0 - 52.0 %   MCV 90.0 78.0 - 100.0 fl   MCHC 32.3 30.0 - 36.0 g/dL   RDW 14.7 11.5 - 15.5 %   Platelets 144.0 (L) 150.0 - 400.0 K/uL   Neutrophils Relative % 54.7 43.0 - 77.0 %   Lymphocytes Relative 36.7 12.0 - 46.0 %   Monocytes Relative 7.8 3.0 - 12.0 %   Eosinophils Relative 0.5 0.0 - 5.0 %   Basophils Relative 0.3 0.0 - 3.0 %   Neutro Abs 3.8 1.4 - 7.7 K/uL   Lymphs Abs 2.5 0.7 - 4.0 K/uL   Monocytes Absolute 0.5 0.1 - 1.0 K/uL   Eosinophils Absolute 0.0 0.0 - 0.7 K/uL   Basophils Absolute 0.0 0.0 - 0.1 K/uL  Vitamin B12  Result Value Ref Range   Vitamin B-12 210 (L) 211 - 911 pg/mL  Folate  Result Value Ref Range   Folate 17.8 >5.9 ng/mL  Renal function panel  Result Value Ref Range   Sodium 141 135 - 145 mEq/L   Potassium 4.6 3.5 - 5.1 mEq/L   Chloride 109 96 - 112 mEq/L   CO2 20 19 - 32 mEq/L   Calcium 9.6 8.4 - 10.5 mg/dL   Albumin 4.4 3.5 - 5.2 g/dL   BUN 20 6 - 23 mg/dL   Creatinine, Ser 1.2 0.4 - 1.5 mg/dL   Glucose, Bld 96 70 - 99 mg/dL   Phosphorus 3.6 2.3 - 4.6 mg/dL   GFR 60.89 >60.00 mL/min      Assessment & Plan:   Problem List Items Addressed This Visit    Smoker    Continue to encourage cessation - states down to 1/2 ppd.      Chronic idiopathic constipation - Primary    Chronic issue, failed linzess, miralax, amitiza, senakot and multiple other bowel regimens in the past.  Not consistent with obstruction, no abd pain.  Check abd series today. Increase miralax to 1.5 capful daily, take MOM 73mL x3 days then start amitiza 82mcg bid. Refer back to GI for further eval. Due for discussion of colonoscopy, prior had declined referral, now willing. Pt agrees with plan.      Relevant Medications   MILK OF MAGNESIA 7.75 % PO SUSP   Other Relevant Orders   DG Abd 2 Views   CAD (coronary artery disease)    Continue beta blocker and long acting nitrate          Follow up plan: Return as needed, for follow up visit.

## 2014-11-01 NOTE — Telephone Encounter (Signed)
Patient came in and said Troy [name] was over $200. He cannot afford it. He is waiting here for a substitute because he doesn't want to drive all the way home and then drive all the way back.

## 2014-11-02 ENCOUNTER — Telehealth: Payer: Self-pay | Admitting: Family Medicine

## 2014-11-02 DIAGNOSIS — Z86010 Personal history of colonic polyps: Secondary | ICD-10-CM | POA: Diagnosis not present

## 2014-11-02 DIAGNOSIS — K5900 Constipation, unspecified: Secondary | ICD-10-CM | POA: Diagnosis not present

## 2014-11-02 NOTE — Telephone Encounter (Signed)
Spoke with patient.

## 2014-11-02 NOTE — Telephone Encounter (Signed)
Patient returned your call.

## 2014-11-09 ENCOUNTER — Other Ambulatory Visit: Payer: Self-pay | Admitting: Gastroenterology

## 2014-11-09 DIAGNOSIS — K635 Polyp of colon: Secondary | ICD-10-CM | POA: Diagnosis not present

## 2014-11-09 DIAGNOSIS — Z1211 Encounter for screening for malignant neoplasm of colon: Secondary | ICD-10-CM | POA: Diagnosis not present

## 2014-11-09 DIAGNOSIS — Z86010 Personal history of colonic polyps: Secondary | ICD-10-CM | POA: Diagnosis not present

## 2014-11-09 DIAGNOSIS — Z09 Encounter for follow-up examination after completed treatment for conditions other than malignant neoplasm: Secondary | ICD-10-CM | POA: Diagnosis not present

## 2014-11-09 DIAGNOSIS — D125 Benign neoplasm of sigmoid colon: Secondary | ICD-10-CM | POA: Diagnosis not present

## 2014-11-09 LAB — HM COLONOSCOPY

## 2014-11-17 ENCOUNTER — Ambulatory Visit (INDEPENDENT_AMBULATORY_CARE_PROVIDER_SITE_OTHER): Payer: Medicare Other | Admitting: *Deleted

## 2014-11-17 DIAGNOSIS — D519 Vitamin B12 deficiency anemia, unspecified: Secondary | ICD-10-CM | POA: Diagnosis not present

## 2014-11-17 MED ORDER — CYANOCOBALAMIN 1000 MCG/ML IJ SOLN
1000.0000 ug | Freq: Once | INTRAMUSCULAR | Status: AC
  Administered 2014-11-17: 1000 ug via INTRAMUSCULAR

## 2014-11-27 ENCOUNTER — Encounter: Payer: Self-pay | Admitting: Family Medicine

## 2014-12-11 ENCOUNTER — Other Ambulatory Visit: Payer: Self-pay | Admitting: Family Medicine

## 2014-12-12 NOTE — Telephone Encounter (Signed)
Pt left v/m requesting status of multiple refills from [facility]; left detailed message that refills had been done and to ck with pharmacy.

## 2014-12-23 ENCOUNTER — Ambulatory Visit (INDEPENDENT_AMBULATORY_CARE_PROVIDER_SITE_OTHER): Payer: Medicare Other

## 2014-12-23 DIAGNOSIS — E538 Deficiency of other specified B group vitamins: Secondary | ICD-10-CM

## 2014-12-23 MED ORDER — CYANOCOBALAMIN 1000 MCG/ML IJ SOLN
1000.0000 ug | Freq: Once | INTRAMUSCULAR | Status: AC
  Administered 2014-12-23: 1000 ug via INTRAMUSCULAR

## 2014-12-28 ENCOUNTER — Ambulatory Visit (INDEPENDENT_AMBULATORY_CARE_PROVIDER_SITE_OTHER): Payer: Medicare Other | Admitting: Family Medicine

## 2014-12-28 ENCOUNTER — Encounter: Payer: Self-pay | Admitting: Family Medicine

## 2014-12-28 VITALS — BP 126/50 | HR 76 | Temp 97.7°F | Wt 167.5 lb

## 2014-12-28 DIAGNOSIS — Z720 Tobacco use: Secondary | ICD-10-CM | POA: Diagnosis not present

## 2014-12-28 DIAGNOSIS — K5900 Constipation, unspecified: Secondary | ICD-10-CM | POA: Diagnosis not present

## 2014-12-28 DIAGNOSIS — K5904 Chronic idiopathic constipation: Secondary | ICD-10-CM

## 2014-12-28 DIAGNOSIS — F17200 Nicotine dependence, unspecified, uncomplicated: Secondary | ICD-10-CM

## 2014-12-28 NOTE — Assessment & Plan Note (Addendum)
Overall doing well - will change miralax to prn moderate constipation but if overall stable may hold. Discussed MOM use 51mL per day as well. Discussed fiber and water in diet. Discussed metamucil if not reaching goal of 20-30gm fiber daily. Samples provided.

## 2014-12-28 NOTE — Progress Notes (Signed)
Pre visit review using our clinic review tool, if applicable. No additional management support is needed unless otherwise documented below in the visit note. 

## 2014-12-28 NOTE — Assessment & Plan Note (Signed)
Continue to encourage cessation. precontemplative. 

## 2014-12-28 NOTE — Patient Instructions (Signed)
Try milk of magnesium 48mL daily as needed for constipation. Increase fiber and water in diet (handout provided).  If we're not getting enough fiber in your diet (ideally 22gm daily), add metamucil or benefiber daily. Coupon provided today. Let me know how you're doing with this.

## 2014-12-28 NOTE — Progress Notes (Signed)
BP 126/50 mmHg  Pulse 76  Temp(Src) 97.7 F (36.5 C) (Oral)  Wt 167 lb 8 oz (75.978 kg)   CC: chronic constipation  Subjective:    Patient ID: Troy [name], male    DOB: 12/11/1932, 81 y.o.   MRN: [id number]  HPI: KEAUNDRE THELIN is a 81 y.o. male presenting on 12/28/2014 for Constipation   Chronic recurring issue for patient. Last seen 11/01/2014 - at that time we increased miralax to 1.5 capfuls daily and added MOM 58mL x3d then amitiza 72mcg BID - this was unaffordable and caused explosive episodes.   Has not taken miralax in last 2 days and feels great today. Last stool 3 days ago, very small amounts. No abd pain, passing gas fine, no nausea/vomiting. No diarrhea.   Has seen GI for this as well - COLONOSCOPY Date: 10/2014 2 hyperplastic polyp, hemmorhoids ([facility]).   Failed linzess, miralax, amitiza, senakot and multiple other bowel regimens including soluble fiber supplements in the past  Planning on traveling to [place] for 58th anniversary of [place] reunion.  Smoking <1 ppd . Precontemplative.  Relevant past medical, surgical, family and social history reviewed and updated as indicated. Interim medical history since our last visit reviewed. Allergies and medications reviewed and updated. Current Outpatient Prescriptions on File Prior to Visit  Medication Sig  . aspirin 81 MG tablet Take 81 mg by mouth daily.   . cyanocobalamin (,VITAMIN B-12,) 1000 MCG/ML injection Inject 1 mL (1,000 mcg total) into the muscle every 30 (thirty) days. Start 07/2014  . isosorbide mononitrate (IMDUR) 30 MG 24 hr tablet Take 1/2 tablet by mouth  daily.  . magnesium hydroxide (MILK OF MAGNESIA) 400 MG/5ML suspension Take 15 mLs by mouth daily as needed for moderate constipation.  . metFORMIN (GLUCOPHAGE) 500 MG tablet Take 1 tablet by mouth two  times daily (take with a  meal).  . metoprolol tartrate (LOPRESSOR) 25 MG tablet Take one-half tablet twice daily  . nitroGLYCERIN  (NITROSTAT) 0.4 MG SL tablet Place 1 tablet (0.4 mg total) under the tongue every 5 (five) minutes as needed. For chest pain-may repeat x3  . omeprazole (PRILOSEC) 40 MG capsule Take 1 capsule by mouth  daily  . polyethylene glycol powder (GLYCOLAX/MIRALAX) powder Take 17 g by mouth daily.  . simvastatin (ZOCOR) 40 MG tablet Take 1 tablet by mouth at  bedtime   No current facility-administered medications on file prior to visit.    Review of Systems Per HPI unless specifically indicated above     Objective:    BP 126/50 mmHg  Pulse 76  Temp(Src) 97.7 F (36.5 C) (Oral)  Wt 167 lb 8 oz (75.978 kg)  Wt Readings from Last 3 Encounters:  12/28/14 167 lb 8 oz (75.978 kg)  11/01/14 166 lb (75.297 kg)  08/01/14 170 lb 8 oz (77.338 kg)    Physical Exam  Constitutional: He appears well-developed and well-nourished. No distress.  Abdominal: Soft. Normal appearance and bowel sounds are normal. He exhibits no distension and no mass. There is no hepatosplenomegaly. There is no tenderness. There is no rigidity, no rebound, no guarding, no CVA tenderness and negative Murphy's sign.  Skin: Skin is warm and dry. No rash noted.  Nursing note and vitals reviewed.  Results for orders placed or performed in visit on 11/27/14  HM COLONOSCOPY  Result Value Ref Range   HM Colonoscopy hyperplastic polyps       Assessment & Plan:   Problem List Items Addressed  This Visit    Smoker    Continue to encourage cessation. precontemplative.      Chronic idiopathic constipation - Primary    Overall doing well - will change miralax to prn moderate constipation but if overall stable may hold. Discussed MOM use 36mL per day as well. Discussed fiber and water in diet. Discussed metamucil if not reaching goal of 20-30gm fiber daily. Samples provided.          Follow up plan: No Follow-up on file.

## 2015-01-27 ENCOUNTER — Ambulatory Visit (INDEPENDENT_AMBULATORY_CARE_PROVIDER_SITE_OTHER): Payer: Medicare Other

## 2015-01-27 DIAGNOSIS — E538 Deficiency of other specified B group vitamins: Secondary | ICD-10-CM

## 2015-01-27 MED ORDER — CYANOCOBALAMIN 1000 MCG/ML IJ SOLN
1000.0000 ug | Freq: Once | INTRAMUSCULAR | Status: AC
  Administered 2015-01-27: 1000 ug via INTRAMUSCULAR

## 2015-02-14 ENCOUNTER — Encounter: Payer: Self-pay | Admitting: Family Medicine

## 2015-02-14 ENCOUNTER — Ambulatory Visit (INDEPENDENT_AMBULATORY_CARE_PROVIDER_SITE_OTHER): Payer: Medicare Other | Admitting: Family Medicine

## 2015-02-14 VITALS — BP 110/60 | HR 64 | Temp 98.1°F | Wt 164.5 lb

## 2015-02-14 DIAGNOSIS — J029 Acute pharyngitis, unspecified: Secondary | ICD-10-CM

## 2015-02-14 DIAGNOSIS — Z720 Tobacco use: Secondary | ICD-10-CM

## 2015-02-14 DIAGNOSIS — K5904 Chronic idiopathic constipation: Secondary | ICD-10-CM

## 2015-02-14 DIAGNOSIS — F17200 Nicotine dependence, unspecified, uncomplicated: Secondary | ICD-10-CM

## 2015-02-14 DIAGNOSIS — R202 Paresthesia of skin: Secondary | ICD-10-CM | POA: Insufficient documentation

## 2015-02-14 DIAGNOSIS — K5900 Constipation, unspecified: Secondary | ICD-10-CM

## 2015-02-14 DIAGNOSIS — J441 Chronic obstructive pulmonary disease with (acute) exacerbation: Secondary | ICD-10-CM | POA: Diagnosis not present

## 2015-02-14 DIAGNOSIS — E118 Type 2 diabetes mellitus with unspecified complications: Secondary | ICD-10-CM | POA: Diagnosis not present

## 2015-02-14 LAB — BASIC METABOLIC PANEL
BUN: 22 mg/dL (ref 6–23)
CO2: 25 meq/L (ref 19–32)
CREATININE: 1.35 mg/dL (ref 0.40–1.50)
Calcium: 9.8 mg/dL (ref 8.4–10.5)
Chloride: 104 mEq/L (ref 96–112)
GFR: 54.1 mL/min — ABNORMAL LOW (ref >60.00)
Glucose, Bld: 106 mg/dL — ABNORMAL HIGH (ref 70–99)
POTASSIUM: 5.2 meq/L — AB (ref 3.5–5.1)
Sodium: 136 mEq/L (ref 135–145)

## 2015-02-14 LAB — TSH: TSH: 2.24 u[IU]/mL (ref 0.35–4.50)

## 2015-02-14 LAB — HEMOGLOBIN A1C: Hgb A1c MFr Bld: 6.5 % (ref 4.6–6.5)

## 2015-02-14 LAB — VITAMIN B12: Vitamin B-12: 445 pg/mL (ref 211–911)

## 2015-02-14 LAB — POCT RAPID STREP A (OFFICE): RAPID STREP A SCREEN: NEGATIVE

## 2015-02-14 MED ORDER — ALBUTEROL SULFATE HFA 108 (90 BASE) MCG/ACT IN AERS: 2.0000 | INHALATION_SPRAY | Freq: Four times a day (QID) | RESPIRATORY_TRACT | Status: DC | PRN

## 2015-02-14 MED ORDER — AZITHROMYCIN 250 MG PO TABS: ORAL_TABLET | ORAL | Status: DC

## 2015-02-14 NOTE — Assessment & Plan Note (Signed)
Continue miralax 1 capful daily. Use MOM PRN.

## 2015-02-14 NOTE — Assessment & Plan Note (Signed)
Continue to encourage cessation. Down to 1/2 ppd.  

## 2015-02-14 NOTE — Assessment & Plan Note (Addendum)
Predominantly L foot paresthesia - discussed different etiologies. Pt has been compliant with monthly B12 injections for last 6 months - recheck levels today. Check A1c, TSH. Pt denies EtOH use. If overall unrevealing, would likely recommend nerve conduction study. Pt agrees and will await labwork results today.

## 2015-02-14 NOTE — Assessment & Plan Note (Signed)
Check A1c today. Foot exam today.

## 2015-02-14 NOTE — Progress Notes (Signed)
BP 110/60 mmHg  Pulse 64  Temp(Src) 98.1 F (36.7 C) (Oral)  Wt 164 lb 8 oz (74.617 kg)   CC: sore throat  Subjective:    Patient ID: Troy [name], male    DOB: 1933/01/13, 81 y.o.   MRN: [id number]  HPI: Troy [name] is a 81 y.o. male presenting on 02/14/2015 for Sore Throat   5-6d h/o sore throat that has progressed to cough, worse with laying down. Cough productive of white phlegm. + dyspnea and wheezing. + mild PNDrainage. Persistent ST.  No fevers/chills, ear or tooth pain, abd pain, headaches.  1/2 ppd.  Known COPD - not on breathing medications.  No sick contacts at home.   Also with L foot numbness when he lays down. Midfoot distally. No burning pain. Denies significant back pain. Foot numbness ongoing for last 2-3 weeks. Rarely R numbness.   Relevant past medical, surgical, family and social history reviewed and updated as indicated. Interim medical history since our last visit reviewed. Allergies and medications reviewed and updated. Current Outpatient Prescriptions on File Prior to Visit  Medication Sig  . aspirin 81 MG tablet Take 81 mg by mouth daily.   . cyanocobalamin (,VITAMIN B-12,) 1000 MCG/ML injection Inject 1 mL (1,000 mcg total) into the muscle every 30 (thirty) days. Start 07/2014  . isosorbide mononitrate (IMDUR) 30 MG 24 hr tablet Take 1/2 tablet by mouth  daily.  . magnesium hydroxide (MILK OF MAGNESIA) 400 MG/5ML suspension Take 15 mLs by mouth daily as needed for moderate constipation.  . metFORMIN (GLUCOPHAGE) 500 MG tablet Take 1 tablet by mouth two  times daily (take with a  meal).  . metoprolol tartrate (LOPRESSOR) 25 MG tablet Take one-half tablet twice daily  . nitroGLYCERIN (NITROSTAT) 0.4 MG SL tablet Place 1 tablet (0.4 mg total) under the tongue every 5 (five) minutes as needed. For chest pain-may repeat x3  . omeprazole (PRILOSEC) 40 MG capsule Take 1 capsule by mouth  daily  . polyethylene glycol powder (GLYCOLAX/MIRALAX) powder  Take 17 g by mouth daily.  . simvastatin (ZOCOR) 40 MG tablet Take 1 tablet by mouth at  bedtime   No current facility-administered medications on file prior to visit.    Review of Systems Per HPI unless specifically indicated above     Objective:    BP 110/60 mmHg  Pulse 64  Temp(Src) 98.1 F (36.7 C) (Oral)  Wt 164 lb 8 oz (74.617 kg)  Wt Readings from Last 3 Encounters:  02/14/15 164 lb 8 oz (74.617 kg)  12/28/14 167 lb 8 oz (75.978 kg)  11/01/14 166 lb (75.297 kg)    Physical Exam  Constitutional: He is oriented to person, place, and time. He appears well-developed and well-nourished. No distress.  HENT:  Head: Normocephalic and atraumatic.  Right Ear: Hearing, tympanic membrane, external ear and ear canal normal.  Left Ear: Hearing, tympanic membrane, external ear and ear canal normal.  Nose: Nose normal. No mucosal edema or rhinorrhea. Right sinus exhibits no maxillary sinus tenderness and no frontal sinus tenderness. Left sinus exhibits no maxillary sinus tenderness and no frontal sinus tenderness.  Mouth/Throat: Uvula is midline, oropharynx is clear and moist and mucous membranes are normal. No oropharyngeal exudate, posterior oropharyngeal edema, posterior oropharyngeal erythema or tonsillar abscesses.  Pale, dry turbinates  Eyes: Conjunctivae and EOM are normal. Pupils are equal, round, and reactive to light. No scleral icterus.  Neck: Normal range of motion. Neck supple.  Cardiovascular: Normal rate, regular rhythm,  normal heart sounds and intact distal pulses.   No murmur heard. Pulmonary/Chest: Effort normal. No respiratory distress. He has decreased breath sounds (mildly tight air movement). He has wheezes (faint exp). He has no rhonchi. He has no rales.  Musculoskeletal: He exhibits no edema.  See hpi for foot exam if done No pedal edema 1+ DP bilaterally  Lymphadenopathy:    He has no cervical adenopathy.  Neurological: He is alert and oriented to person,  place, and time. He has normal strength. A sensory deficit is present. He displays a negative Romberg sign. Coordination and gait normal.  Reflex Scores:      Patellar reflexes are 1+ on the right side and 1+ on the left side. 5/5 strength BLE Diminished sensation to light touch and monofilament LLE from mid calf downward. Temp sensation intact.  Skin: Skin is warm and dry. No rash noted.  Nursing note and vitals reviewed.  Results for orders placed or performed in visit on 02/14/15  POCT rapid strep A  Result Value Ref Range   Rapid Strep A Screen Negative Negative   Lab Results  Component Value Date   HGBA1C 6.5 08/01/2014        Assessment & Plan:   Problem List Items Addressed This Visit    Chronic idiopathic constipation    Continue miralax 1 capful daily. Use MOM PRN.      COPD exacerbation - Primary    Lung exam overall ok , but given COPD hx and sxs anticipate mild COPD flare. Doubt need for prednisone currently - so will just treat with azithromycin antibiotic course + add albuterol inhaler PRN dyspnea. Discussed inhalation technique. Update if fever >101, worsening productive cough or worsening wheeze/dypsnea. Pt agrees with plan.      Relevant Medications   azithromycin (ZITHROMAX Z-PAK) 250 MG tablet   albuterol (PROVENTIL HFA;VENTOLIN HFA) 108 (90 BASE) MCG/ACT inhaler   Diabetes mellitus type 2 with complications    Check E4M today. Foot exam today.      Paresthesia of left foot    Predominantly L foot paresthesia - discussed different etiologies. Pt has been compliant with monthly B12 injections for last 6 months - recheck levels today. Check A1c, TSH. Pt denies EtOH use. If overall unrevealing, would likely recommend nerve conduction study. Pt agrees and will await labwork results today.      Relevant Orders   TSH   Hemoglobin P5T   Basic metabolic panel   Vitamin I14   Smoker    Continue to encourage cessation. Down to 1/2 ppd.       Other Visit  Diagnoses    Sore throat        Relevant Orders    POCT rapid strep A (Completed)        Follow up plan: Return if symptoms worsen or fail to improve.

## 2015-02-14 NOTE — Assessment & Plan Note (Signed)
Lung exam overall ok , but given COPD hx and sxs anticipate mild COPD flare. Doubt need for prednisone currently - so will just treat with azithromycin antibiotic course + add albuterol inhaler PRN dyspnea. Discussed inhalation technique. Update if fever >101, worsening productive cough or worsening wheeze/dypsnea. Pt agrees with plan.

## 2015-02-14 NOTE — Patient Instructions (Addendum)
For breathing - treat with zpack, albuterol inhaler. Let us know if not improving with treatment or worsening shortness of breath or fever.  For legs - labwork today. This could be from low B12 or sugar too high. We will call you with results and plan. Go back to daily miralax with milk of magnesia only when constipated. Good to see you today, call us with questions

## 2015-02-14 NOTE — Progress Notes (Signed)
Pre visit review using our clinic review tool, if applicable. No additional management support is needed unless otherwise documented below in the visit note. 

## 2015-02-20 ENCOUNTER — Encounter: Payer: Self-pay | Admitting: *Deleted

## 2015-03-06 ENCOUNTER — Other Ambulatory Visit: Payer: Self-pay | Admitting: Family Medicine

## 2015-03-21 ENCOUNTER — Ambulatory Visit (INDEPENDENT_AMBULATORY_CARE_PROVIDER_SITE_OTHER): Payer: Medicare Other | Admitting: *Deleted

## 2015-03-21 DIAGNOSIS — E538 Deficiency of other specified B group vitamins: Secondary | ICD-10-CM

## 2015-03-21 MED ORDER — CYANOCOBALAMIN 1000 MCG/ML IJ SOLN
1000.0000 ug | Freq: Once | INTRAMUSCULAR | Status: AC
  Administered 2015-03-21: 1000 ug via INTRAMUSCULAR

## 2015-05-05 ENCOUNTER — Ambulatory Visit (INDEPENDENT_AMBULATORY_CARE_PROVIDER_SITE_OTHER): Payer: Medicare Other

## 2015-05-05 DIAGNOSIS — E538 Deficiency of other specified B group vitamins: Secondary | ICD-10-CM | POA: Diagnosis not present

## 2015-05-05 MED ORDER — CYANOCOBALAMIN 1000 MCG/ML IJ SOLN
1000.0000 ug | Freq: Once | INTRAMUSCULAR | Status: AC
  Administered 2015-05-05: 1000 ug via INTRAMUSCULAR

## 2015-05-25 ENCOUNTER — Encounter: Payer: Self-pay | Admitting: Family Medicine

## 2015-05-25 ENCOUNTER — Ambulatory Visit (INDEPENDENT_AMBULATORY_CARE_PROVIDER_SITE_OTHER): Payer: Medicare Other | Admitting: Family Medicine

## 2015-05-25 VITALS — BP 124/58 | HR 64 | Temp 98.1°F | Wt 164.5 lb

## 2015-05-25 DIAGNOSIS — K5904 Chronic idiopathic constipation: Secondary | ICD-10-CM

## 2015-05-25 DIAGNOSIS — K5900 Constipation, unspecified: Secondary | ICD-10-CM | POA: Diagnosis not present

## 2015-05-25 NOTE — Assessment & Plan Note (Addendum)
Reviewed 24 hr recall - very low fiber in diet. Discussed this extensively - rec increased fiber (and examples provided) and water. If not reaching goal fiber in diet, add supplement (chewable or powder). rec trial probiotic. MOM PRN constipation. Update with effect of current treatment plan. Pt agrees with plan.  No abd pain, no red flags.

## 2015-05-25 NOTE — Progress Notes (Signed)
Pre visit review using our clinic review tool, if applicable. No additional management support is needed unless otherwise documented below in the visit note. 

## 2015-05-25 NOTE — Progress Notes (Signed)
BP 124/58 mmHg  Pulse 64  Temp(Src) 98.1 F (36.7 C) (Oral)  Wt 164 lb 8 oz (74.617 kg)   CC: chronic constipation, abd pain  Subjective:    Patient ID: TRAVANTE KNEE, male    DOB: [birth year], 81 y.o.   MRN: [id number]  HPI: HENSLEY TREAT is a 81 y.o. male presenting on 05/25/2015 for Abdominal Pain   Chronic recurring issue for patient. Last seen 12/28/2014 and was doing well. We changed miralax to PRN moderate constipation, discussed daily MOM 39mL and added metamucil if not reaching goal fiber in diet (22gm). He has not been regular with this. Tried MOM a few days ago - makes him have a stool later in the night. No recent miralax use.   Overall feeling well, but still bothered by chronic constipation. Sunday had bowel accident - describes watery and loose, no blood or mucous. No fevers, no abd pain, no weight changes.   Has seen GI for this as well - COLONOSCOPY Date: 10/2014 - 2 hyperplastic polyp, hemmorhoids ([facility]).   Failed linzess, miralax, amitiza (explosive diarrhea, expensive), senakot and multiple other bowel regimens including soluble fiber supplements in the past.   24 hr recall: Breakfast - sausage and eggs, white toast, orange juice and 2 cups coffee.  Water or tea in between Supper 6pm - tomato/bologne sandwich on white bread, sweet tea.   Relevant past medical, surgical, family and social history reviewed and updated as indicated. Interim medical history since our last visit reviewed. Allergies and medications reviewed and updated. Current Outpatient Prescriptions on File Prior to Visit  Medication Sig  . albuterol (PROVENTIL HFA;VENTOLIN HFA) 108 (90 BASE) MCG/ACT inhaler Inhale 2 puffs into the lungs every 6 (six) hours as needed for wheezing or shortness of breath.  [name] aspirin 81 MG tablet Take 81 mg by mouth daily.   . cyanocobalamin (,VITAMIN B-12,) 1000 MCG/ML injection Inject 1 mL (1,000 mcg total) into the muscle every 30 (thirty) days. Start 07/2014    . isosorbide mononitrate (IMDUR) 30 MG 24 hr tablet Take one-half tablet by  mouth daily  . magnesium hydroxide (MILK OF MAGNESIA) 400 MG/5ML suspension Take 15 mLs by mouth daily as needed for moderate constipation.  . metFORMIN (GLUCOPHAGE) 500 MG tablet Take 1 tablet by mouth two  times daily with meals  . metoprolol tartrate (LOPRESSOR) 25 MG tablet Take one-half tablet by  mouth two times daily  . simvastatin (ZOCOR) 40 MG tablet Take 1 tablet by mouth at  bedtime  . nitroGLYCERIN (NITROSTAT) 0.4 MG SL tablet Place 1 tablet (0.4 mg total) under the tongue every 5 (five) minutes as needed. For chest pain-may repeat x3 (Patient not taking: Reported on 05/25/2015)  . omeprazole (PRILOSEC) 40 MG capsule Take 1 capsule by mouth  daily  . polyethylene glycol powder (GLYCOLAX/MIRALAX) powder Take 17 g by mouth daily. (Patient not taking: Reported on 05/25/2015)   No current facility-administered medications on file prior to visit.    Review of Systems Per HPI unless specifically indicated above     Objective:    BP 124/58 mmHg  Pulse 64  Temp(Src) 98.1 F (36.7 C) (Oral)  Wt 164 lb 8 oz (74.617 kg)  Wt Readings from Last 3 Encounters:  05/25/15 164 lb 8 oz (74.617 kg)  02/14/15 164 lb 8 oz (74.617 kg)  12/28/14 167 lb 8 oz (75.978 kg)    Physical Exam  Constitutional: He appears well-developed and well-nourished. No distress.  HENT:  Mouth/Throat:  Oropharynx is clear and moist. No oropharyngeal exudate.  Abdominal: Soft. Normal appearance and bowel sounds are normal. He exhibits no distension and no mass. There is no hepatosplenomegaly. There is no tenderness. There is no rigidity, no rebound, no guarding and negative Murphy's sign.  Musculoskeletal: He exhibits no edema.  Nursing note and vitals reviewed.     Assessment & Plan:   Problem List Items Addressed This Visit    Chronic idiopathic constipation - Primary    Reviewed 24 hr recall - very low fiber in diet. Discussed this  extensively - rec increased fiber (and examples provided) and water. If not reaching goal fiber in diet, add supplement (chewable or powder). rec trial probiotic. MOM PRN constipation. Update with effect of current treatment plan. Pt agrees with plan.  No abd pain, no red flags.          Follow up plan: Return if symptoms worsen or fail to improve.

## 2015-05-25 NOTE — Patient Instructions (Signed)
You have very low fiber in your diet. To combat chronic constipation, we need to start by increasing water and fiber in diet - look at fiber handout provided. Change white to whole grain bread, add fruits and vegetables daily. If not reaching goal fiber in diet, add fiber supplement (chewable or powder like metamucil) Start probiotic daily ([facility] or [place]). Let me know how you do.  Constipation Constipation is when a person has fewer than three bowel movements a week, has difficulty having a bowel movement, or has stools that are dry, hard, or larger than normal. As people grow older, constipation is more common. If you try to fix constipation with medicines that make you have a bowel movement (laxatives), the problem may get worse. Long-term laxative use may cause the muscles of the colon to become weak. A low-fiber diet, not taking in enough fluids, and taking certain medicines may make constipation worse.  CAUSES   Certain medicines, such as antidepressants, pain medicine, iron supplements, antacids, and water pills.   Certain diseases, such as diabetes, irritable bowel syndrome (IBS), thyroid disease, or depression.   Not drinking enough water.   Not eating enough fiber-rich foods.   Stress or travel.   Lack of physical activity or exercise.   Ignoring the urge to have a bowel movement.   Using laxatives too much.  SIGNS AND SYMPTOMS   Having fewer than three bowel movements a week.   Straining to have a bowel movement.   Having stools that are hard, dry, or larger than normal.   Feeling full or bloated.   Pain in the lower abdomen.   Not feeling relief after having a bowel movement.  DIAGNOSIS  Your health care provider will take a medical history and perform a physical exam. Further testing may be done for severe constipation. Some tests may include:  A barium enema X-ray to examine your rectum, colon, and, sometimes, your small intestine.    A sigmoidoscopy to examine your lower colon.   A colonoscopy to examine your entire colon. TREATMENT  Treatment will depend on the severity of your constipation and what is causing it. Some dietary treatments include drinking more fluids and eating more fiber-rich foods. Lifestyle treatments may include regular exercise. If these diet and lifestyle recommendations do not help, your health care provider may recommend taking over-the-counter laxative medicines to help you have bowel movements. Prescription medicines may be prescribed if over-the-counter medicines do not work.  HOME CARE INSTRUCTIONS   Eat foods that have a lot of fiber, such as fruits, vegetables, whole grains, and beans.  Limit foods high in fat and processed sugars, such as french fries, hamburgers, cookies, candies, and soda.   A fiber supplement may be added to your diet if you cannot get enough fiber from foods.   Drink enough fluids to keep your urine clear or pale yellow.   Exercise regularly or as directed by your health care provider.   Go to the restroom when you have the urge to go. Do not hold it.   Only take over-the-counter or prescription medicines as directed by your health care provider. Do not take other medicines for constipation without talking to your health care provider first.  [place] IF:   You have bright red blood in your stool.   Your constipation lasts for more than 4 days or gets worse.   You have abdominal or rectal pain.   You have thin, pencil-like stools.  You have unexplained weight loss. MAKE SURE YOU:   Understand these instructions.  Will watch your condition.  Will get help right away if you are not doing well or get worse. Document Released: 05/31/2004 Document Revised: 09/07/2013 Document Reviewed: 06/14/2013 [facility] Patient Information 2015 [place], [place]. This information is not intended to replace advice given to you by your health  care provider. Make sure you discuss any questions you have with your health care provider.

## 2015-06-12 ENCOUNTER — Telehealth: Payer: Self-pay | Admitting: Family Medicine

## 2015-06-12 NOTE — Telephone Encounter (Signed)
Appointment scheduled 06/13/15

## 2015-06-12 NOTE — Telephone Encounter (Signed)
[facility] Patient Name: MANINDER DEBOER DOB: [birth year] Initial Comment Caller states he hasn't had a BM in 4 days. Last time he used it, it was just pure red blood. Nurse Assessment Nurse: Markus Daft, RN, Sherre Poot Date/Time (Eastern Time): 06/12/2015 2:30:05 PM Confirm and document reason for call. If symptomatic, describe symptoms. ---Caller states on Wednesday and Thursday a small of hard stool, and then pure red blood which filled that toilet bowl. He then has not had any BM or bleeding for 4 days now. He c/o mid abdominal pain off/on - pain started in last 2 days. No fever. Has the patient traveled out of the country within the last 30 days? ---Not Applicable Does the patient require triage? ---Yes Related visit to physician within the last 2 weeks? ---No Does the PT have any chronic conditions? (i.e. diabetes, asthma, etc.) ---Yes List chronic conditions. ---Constipation - bowel habits required MOM almost every day if no BM. Usually has a BM every 2 days; CAD - CABG years ago Guidelines Guideline Title Affirmed Question Affirmed Notes Constipation Last bowel movement (BM) > 4 days ago He rates stomach pain 3-4/10 Final Disposition User See Physician within [place], RN, [place] Referrals REFERRED TO PCP OFFICE Disagree/Comply: Comply

## 2015-06-12 NOTE — Telephone Encounter (Signed)
Noted. Will see tomorrow. Chronic constipation issues.

## 2015-06-13 ENCOUNTER — Encounter: Payer: Self-pay | Admitting: Family Medicine

## 2015-06-13 ENCOUNTER — Ambulatory Visit (INDEPENDENT_AMBULATORY_CARE_PROVIDER_SITE_OTHER): Payer: Medicare Other | Admitting: Family Medicine

## 2015-06-13 VITALS — BP 110/60 | HR 68 | Temp 97.9°F | Wt 162.0 lb

## 2015-06-13 DIAGNOSIS — K5900 Constipation, unspecified: Secondary | ICD-10-CM

## 2015-06-13 DIAGNOSIS — R413 Other amnesia: Secondary | ICD-10-CM | POA: Diagnosis not present

## 2015-06-13 DIAGNOSIS — F0390 Unspecified dementia without behavioral disturbance: Secondary | ICD-10-CM | POA: Insufficient documentation

## 2015-06-13 DIAGNOSIS — K649 Unspecified hemorrhoids: Secondary | ICD-10-CM

## 2015-06-13 DIAGNOSIS — K5904 Chronic idiopathic constipation: Secondary | ICD-10-CM

## 2015-06-13 MED ORDER — POLYETHYLENE GLYCOL 3350 17 GM/SCOOP PO POWD: 8.5000 g | Freq: Every day | ORAL | Status: DC

## 2015-06-13 NOTE — Progress Notes (Signed)
BP 110/60 mmHg  Pulse 68  Temp(Src) 97.9 F (36.6 C) (Oral)  Wt 162 lb (73.483 kg)   CC: constipation  Subjective:    Patient ID: Troy [name], male    DOB: [birth year], 81 y.o.   MRN: [id number]  HPI: Troy [name] is a 81 y.o. male presenting on 06/13/2015 for Constipation   See recent phone note and last office visit for details. Chronic constipation issues. Small hard stools last week then had episode of bright red blood filling commode. No clots. declines straining. Mild mid abdominal discomfort over last 2 days. Passing flatus well.   No fevers/chills.   When last seen here 05/25/2015 we had extensive discussion on importance of bowel regimen for his chronic idiopathic constipation. He has a very low fiber diet. We discussed dietary fiber + supplemental fiber if needed. We also discussed starting probiotic.   Failed linzess, miralax, amitiza (explosive diarrhea, expensive), senakot and multiple other bowel regimens including soluble fiber supplements in the past.   COLONOSCOPY Date: 10/2014 2 hyperplastic polyp, hemmorrhoids both internal and external ([facility])  2 meals a day. Pt unsure if he's doing better with fiber "my wife's taking care of that". He thinks he's been regular with metamucil. I called and spoke with wife who endorses he is not drinking water or taking metamucil. Some concern bowel regimen limited by remembering to take medications.  Relevant past medical, surgical, family and social history reviewed and updated as indicated. Interim medical history since our last visit reviewed. Allergies and medications reviewed and updated. Current Outpatient Prescriptions on File Prior to Visit  Medication Sig  . albuterol (PROVENTIL HFA;VENTOLIN HFA) 108 (90 BASE) MCG/ACT inhaler Inhale 2 puffs into the lungs every 6 (six) hours as needed for wheezing or shortness of breath.  [name] aspirin 81 MG tablet Take 81 mg by mouth daily.   . cyanocobalamin (,VITAMIN B-12,) 1000  MCG/ML injection Inject 1 mL (1,000 mcg total) into the muscle every 30 (thirty) days. Start 07/2014  . isosorbide mononitrate (IMDUR) 30 MG 24 hr tablet Take one-half tablet by  mouth daily  . metoprolol tartrate (LOPRESSOR) 25 MG tablet Take one-half tablet by  mouth two times daily  . simvastatin (ZOCOR) 40 MG tablet Take 1 tablet by mouth at  bedtime  . magnesium hydroxide (MILK OF MAGNESIA) 400 MG/5ML suspension Take 15 mLs by mouth daily as needed for moderate constipation. (Patient not taking: Reported on 06/13/2015)  . metFORMIN (GLUCOPHAGE) 500 MG tablet Take 1 tablet by mouth two  times daily with meals  . nitroGLYCERIN (NITROSTAT) 0.4 MG SL tablet Place 1 tablet (0.4 mg total) under the tongue every 5 (five) minutes as needed. For chest pain-may repeat x3 (Patient not taking: Reported on 05/25/2015)  . omeprazole (PRILOSEC) 40 MG capsule Take 1 capsule by mouth  daily   No current facility-administered medications on file prior to visit.    Review of Systems Per HPI unless specifically indicated above     Objective:    BP 110/60 mmHg  Pulse 68  Temp(Src) 97.9 F (36.6 C) (Oral)  Wt 162 lb (73.483 kg)  Wt Readings from Last 3 Encounters:  06/13/15 162 lb (73.483 kg)  05/25/15 164 lb 8 oz (74.617 kg)  02/14/15 164 lb 8 oz (74.617 kg)    Physical Exam  Constitutional: He appears well-developed and well-nourished. No distress.  HENT:  Mouth/Throat: Oropharynx is clear and moist. No oropharyngeal exudate.  Cardiovascular: Normal rate, regular rhythm, normal heart sounds  and intact distal pulses.   No murmur heard. Pulmonary/Chest: Effort normal and breath sounds normal. No respiratory distress. He has no wheezes. He has no rales.  coarse  Abdominal: Soft. Normal appearance and bowel sounds are normal. He exhibits no distension and no mass. There is no hepatosplenomegaly. There is no tenderness. There is no rigidity, no rebound, no guarding, no CVA tenderness and negative  Murphy's sign.  Genitourinary: Rectum normal and prostate normal. Rectal exam shows no external hemorrhoid, no fissure, no mass, no tenderness and anal tone normal. Prostate is not enlarged and not tender.  No obvious external hemorrhoid or anal fissure  Musculoskeletal: He exhibits no edema.  Skin: Skin is warm and dry. No rash noted.  Nursing note and vitals reviewed.  Results for orders placed or performed in visit on 02/14/15  TSH  Result Value Ref Range   TSH 2.24 0.35 - 4.50 uIU/mL  Hemoglobin A1c  Result Value Ref Range   Hgb A1c MFr Bld 6.5 4.6 - 6.5 %  Basic metabolic panel  Result Value Ref Range   Sodium 136 135 - 145 mEq/L   Potassium 5.2 (H) 3.5 - 5.1 mEq/L   Chloride 104 96 - 112 mEq/L   CO2 25 19 - 32 mEq/L   Glucose, Bld 106 (H) 70 - 99 mg/dL   BUN 22 6 - 23 mg/dL   Creatinine, Ser 1.35 0.40 - 1.50 mg/dL   Calcium 9.8 8.4 - 10.5 mg/dL   GFR 54.10 (L) >60.00 mL/min  Vitamin B12  Result Value Ref Range   Vitamin B-12 445 211 - 911 pg/mL  POCT rapid strep A  Result Value Ref Range   Rapid Strep A Screen Negative Negative      Assessment & Plan:  Over 25 minutes were spent face-to-face with the patient during this encounter and >50% of that time was spent on counseling and coordination of care  Problem List Items Addressed This Visit    Memory deficit    Concern for this - will need MMSE and further eval in near interim.      Chronic idiopathic constipation - Primary    Again emphasized importance of consistency with daily bowel regimen. rec continue metamucil and water daily, suggested add daily prune juice, start 1/2 capful miralax for next week and I will call him at end of week for update.       Relevant Medications   polyethylene glycol powder (GLYCOLAX/MIRALAX) powder   Bleeding hemorrhoid    Presumed cause of bleed, discussed direct relation to constipation and straining.          Follow up plan: Return if symptoms worsen or fail to improve.

## 2015-06-13 NOTE — Assessment & Plan Note (Signed)
Again emphasized importance of consistency with daily bowel regimen. rec continue metamucil and water daily, suggested add daily prune juice, start 1/2 capful miralax for next week and I will call him at end of week for update.

## 2015-06-13 NOTE — Assessment & Plan Note (Signed)
Concern for this - will need MMSE and further eval in near interim.

## 2015-06-13 NOTE — Assessment & Plan Note (Signed)
Presumed cause of bleed, discussed direct relation to constipation and straining.

## 2015-06-13 NOTE — Patient Instructions (Addendum)
Continue metamucil every day with plenty of water (fiber supplement).  Add miralax powder every day for next 1 week and call me with effect.  Recent bleed was likely from internal hemorrhoids. We need to avoid straining and hard stools to prevent another bleed.  Add on a glass a day of prune juice.

## 2015-06-13 NOTE — Progress Notes (Signed)
Pre visit review using our clinic review tool, if applicable. No additional management support is needed unless otherwise documented below in the visit note. 

## 2015-06-19 ENCOUNTER — Telehealth: Payer: Self-pay | Admitting: Family Medicine

## 2015-06-19 NOTE — Telephone Encounter (Signed)
Spoke with wife. Doing well - 3 good sized BM over last week. Continues metamucil, 1/2 capful of miralax daily and probiotic daily.

## 2015-06-29 ENCOUNTER — Ambulatory Visit: Payer: Medicare Other

## 2015-07-05 ENCOUNTER — Ambulatory Visit (INDEPENDENT_AMBULATORY_CARE_PROVIDER_SITE_OTHER): Payer: Medicare Other

## 2015-07-05 DIAGNOSIS — E538 Deficiency of other specified B group vitamins: Secondary | ICD-10-CM | POA: Diagnosis not present

## 2015-07-05 MED ORDER — CYANOCOBALAMIN 1000 MCG/ML IJ SOLN
1000.0000 ug | Freq: Once | INTRAMUSCULAR | Status: AC
  Administered 2015-07-05: 1000 ug via INTRAMUSCULAR

## 2015-07-06 NOTE — Telephone Encounter (Signed)
Pt called, has not had BM in over a week.  Offered appointment with multiple providers today, pt strongly declined, only wants PCP.  Pt states he is taking all medications and added milk of magnesia 1 week ago and again this morning.  Pharmacy of choice is [facility].  Best number to call pt is [phone] / lt

## 2015-07-06 NOTE — Telephone Encounter (Signed)
Spoke with patient. Last BM Thursday. Minimal abd discomfort. Passing some gas. Reports compliance with metamucil, miralax 1/2 capful daily. Endorses drinking good water. Has not been using prune juice.  rec start dulcolax suppository, increase miralax to 1 capful daily, and call me with update at 4:30pm today. Spoke with wife - she states he is not taking above meds as he endorses.

## 2015-07-07 ENCOUNTER — Telehealth: Payer: Self-pay

## 2015-07-10 ENCOUNTER — Telehealth: Payer: Self-pay | Admitting: Family Medicine

## 2015-07-10 NOTE — Telephone Encounter (Signed)
Thanks

## 2015-07-10 NOTE — Telephone Encounter (Signed)
PLEASE NOTE: All timestamps contained within this report are represented as [place] Standard Time. CONFIDENTIALTY NOTICE: This fax transmission is intended only for the addressee. It contains information that is legally privileged, confidential or otherwise protected from use or disclosure. If you are not the intended recipient, you are strictly prohibited from reviewing, disclosing, copying using or disseminating any of this information or taking any action in reliance on or regarding this information. If you have received this fax in error, please notify us immediately by telephone so that we can arrange for its return to [place]. Phone: [phone], Toll-Free: [phone], Fax: [phone] Page: 1 of 1 Call Id: [id number] [place] Patient Name: Troy [name] Gender: Male DOB: 03/16/1933 Age: 81 Y 11 M 25 D Return Phone Number: Address: City/State/Zip: [place] Client [place] - Client Client Site [facility] [place], [place] Type Call Caller Name [place] Phone Number [phone] Relationship To Patient Self Is this call to report lab results? No Call Type General Information Initial Comment Caller states returning call to doctor, and states "had good BM." General Information Type Hours of Operation Nurse Assessment Guidelines Guideline Title Affirmed Question Affirmed Notes Nurse Date/Time ([employer] Time) Disp. Time Eilene Ghazi Time) Disposition Final User 07/06/2015 5:06:29 PM General Information Provided Yes Renato Shin After Care Instructions Given Call Event Type User Date / Time Description

## 2015-07-10 NOTE — Telephone Encounter (Signed)
error 

## 2015-07-31 ENCOUNTER — Encounter: Payer: Self-pay | Admitting: Family Medicine

## 2015-07-31 ENCOUNTER — Ambulatory Visit (INDEPENDENT_AMBULATORY_CARE_PROVIDER_SITE_OTHER): Payer: Medicare Other | Admitting: Family Medicine

## 2015-07-31 VITALS — BP 110/60 | HR 61 | Temp 98.2°F | Ht 69.5 in | Wt 161.2 lb

## 2015-07-31 DIAGNOSIS — M7022 Olecranon bursitis, left elbow: Secondary | ICD-10-CM

## 2015-07-31 MED ORDER — CEPHALEXIN 500 MG PO CAPS: 1000.0000 mg | ORAL_CAPSULE | Freq: Two times a day (BID) | ORAL | Status: DC

## 2015-07-31 NOTE — Progress Notes (Signed)
Pre visit review using our clinic review tool, if applicable. No additional management support is needed unless otherwise documented below in the visit note. 

## 2015-07-31 NOTE — Patient Instructions (Signed)
Elbow Bursitis  Elbow bursitis is inflammation of the fluid-filled sac (bursa) between the tip of your elbow bone (olecranon) and your skin. Elbow bursitis may also be called olecranon bursitis.  Normally, the olecranon bursa has only a small amount of fluid in it to cushion and protect your elbow bone. Elbow bursitis causes fluid to build up inside the bursa. Over time, this swelling and inflammation can cause pain when you bend or lean on your elbow.   CAUSES  Elbow bursitis may be caused by:    Elbow injury (acute trauma).   Leaning on hard surfaces for long periods of time.   Infection from an injury that breaks the skin near your elbow.   A bone growth (spur) that forms at the tip of your elbow.   A medical condition that causes inflammation in your body, such as gout or rheumatoid arthritis.   The cause may also be unknown.   SIGNS AND SYMPTOMS   The first sign of elbow bursitis is usually swelling over the tip of your elbow. This can grow to be the size of a golf ball. This may start suddenly or develop gradually. You may also have:   Pain when bending or leaning on your elbow.   Restricted movement of your elbow.   If your bursitis is caused by an infection, symptoms may also include:   Redness, warmth, and tenderness of the elbow.   Drainage of pus from the swollen area over your elbow, if the skin breaks open.  DIAGNOSIS   Your health care provider may be able to diagnose elbow bursitis based on your signs and symptoms, especially if you have recently been injured. Your health care provider will also do a physical exam. This may include:   X-rays to look for a bone spur or a bone fracture.   Draining fluid from the bursa to test it for infection.   Blood tests to rule out gout or rheumatoid arthritis.  TREATMENT   Treatment for elbow bursitis depends on the cause. Treatment may include:   Medicines. These may include:    Over-the-counter medicines to relieve pain and inflammation.     Antibiotic medicines to fight infection.    Injections of anti-inflammatory medicines (steroids).   Wrapping your elbow with a bandage.   Draining fluid from the bursa.   Wearing elbow pads.   If your bursitis does not get better with treatment, surgery may be needed to remove the bursa.   HOME CARE INSTRUCTIONS    Take medicines only as directed by your health care provider.   If you were prescribed an antibiotic medicine, finish all of it even if you start to feel better.   If your bursitis is caused by an injury, rest your elbow and wear your bandage as directed by your health care provider. You may alsoapply ice to the injured area as directed by your health care provider:   Put ice in a plastic bag.   Place a towel between your skin and the bag.   Leave the ice on for 20 minutes, 2-3 times per day.   Avoid any activities that cause elbow pain.   Use elbow pads or elbow wraps to cushion your elbow.  SEEK MEDICAL CARE IF:   You have a fever.    Your symptoms do not get better with treatment.   Your pain or swelling gets worse.   Your elbow pain or swelling goes away and then returns.   You have   drainage of pus from the swollen area over your elbow.     This information is not intended to replace advice given to you by your health care provider. Make sure you discuss any questions you have with your health care provider.     Document Released: 10/02/2006 Document Revised: 09/23/2014 Document Reviewed: 05/11/2014  Elsevier Interactive Patient Education 2016 [employer] Inc[employer]

## 2015-07-31 NOTE — Progress Notes (Signed)
Dr. Frederico Hamman T. Padraic Marinos, MD, [facility] [place], [address] Phone: [phone] Fax: [phone]  07/31/2015  Patient: Troy [name], MRN: [id number], DOB: [birth year], 81 y.o.  Primary Physician:  [name], MD   Chief Complaint  Patient presents with  . Joint Swelling    Left elbow   Subjective:   Troy [name] is a 81 y.o. very pleasant male patient who presents with the following:  1 week olecranon bursitis. No known injury or trauma. No prior. No repetitive motion at  All - all on the LEFT side. No pain, no redness or warmth.   inj  No steroids  Past Medical History, Surgical History, Social History, Family History, Problem List, Medications, and Allergies have been reviewed and updated if relevant.  Patient Active Problem List   Diagnosis Date Noted  . Bleeding hemorrhoid 06/13/2015  . Memory deficit 06/13/2015  . Paresthesia of left foot 02/14/2015  . COPD exacerbation ([place]) 02/14/2015  . Orthostatic dizziness 08/01/2014  . Paresthesia of foot, bilateral 08/01/2014  . Mid back pain on right side 03/28/2014  . Chest wall pain 03/12/2013  . Supraventricular tachycardia ([place]) 11/24/2012  . CAD (coronary artery disease) 11/24/2012  . Loss of weight 10/26/2012  . Lumbosacral radiculopathy at L5 12/23/2011  . Thrombocytopenia ([place])   . Barrett's esophagus   . Skin rash 09/16/2011  . PUD (peptic ulcer disease) 05/16/2011  . Medicare annual wellness visit, subsequent 04/30/2011  . COPD (chronic obstructive pulmonary disease) ([place]) 12/26/2010  . OTHER CHEST PAIN 10/04/2010  . Diabetes mellitus type 2 with complications ([place]) 99991111  . Chronic idiopathic constipation 07/17/2010  . HYPERCHOLESTEROLEMIA 05/13/2009  . Smoker 05/13/2009    Past Medical History  Diagnosis Date  . Coronary atherosclerosis of artery bypass graft   . HLD (hyperlipidemia)   . Tobacco use disorder   .  Hypertrophy of prostate without urinary obstruction and other lower urinary tract symptoms (LUTS)   . Diabetes mellitus type II ~2008  . Gastrointestinal ulcer 04/2011    with hematemesis s/p EGD by [facility]  . Prostate nodule ~2009    Left-benign s/p eval Uro WNL ([place])  . Chronic obstructive pulmonary disease (COPD) ([facility])     spirometry with moderate COPD  . Barrett's esophagus     EGD 2012, rpt 2014, longterm PPI  . Thrombocytopenia ([place])   . HNP (herniated nucleus pulposus), lumbar 2013    with compression of L5 nerve root and foot drop, also with lumbar DDD s/p surgery  . GERD (gastroesophageal reflux disease)   . Paroxysmal SVT (supraventricular tachycardia) ([place])     a. 3/14 => converted in ED with adenosine to AFib => NSR;  b. Echo 4/14:  Mild LVH, mild FBSH, EF 55-60%, Gr 1 DD  . Chronic idiopathic constipation 07/17/2010    Past Surgical History  Procedure Laterality Date  . Coronary artery bypass graft  1999    Dr. Roxy Manns  . Hemorrhoid surgery    . Nose surgery    . Rotator cuff repair  04/2010    right (but has bilat) Dr. Latanya Maudlin ([place])  . Colonoscopy  03/2009    small hemorrhoids, 2 polyps (hyperplastic and adenomatous), rpt 5 yrs (Dr. Barron Schmid) - pt declined rpt scheduling  . Esophagogastroduodenoscopy  05/16/2011    after hematemesis, no lesions found, consistent with barrett's esophagus rpt 2 yrs  . Lumbar laminectomy/decompression microdiscectomy  01/31/2012    Procedure: LUMBAR  LAMINECTOMY/DECOMPRESSION MICRODISCECTOMY 1 LEVEL;  Surgeon: Winfield Cunas, MD;  Location: [facility];  Service: Neurosurgery;  Laterality: Left;  LEFT Lumbar Four-Five Diskectomy  . Esophagogastroduodenoscopy  06/2013    barrett's, small HH, recheck 2-3 yrs ([place])  . Colonoscopy  10/2014    2 hyperplastic polyp, hemmorhoids ([place])    Social History   Social History  . Marital Status: Married    Spouse Name: N/A  . Number of Children: N/A  . Years of Education: N/A    Occupational History  . Retired from [employer]    Social History Main Topics  . Smoking status: Current Every Day Smoker -- 0.50 packs/day for 60 years    Types: Cigarettes    Start date: 09/16/1952  . Smokeless tobacco: Current User    Types: Snuff     Comment: < 1 ppd; does dip sometimes  . Alcohol Use: Yes     Comment: Seldom  . Drug Use: No  . Sexual Activity: Not on file   Other Topics Concern  . Not on file   Social History Narrative   Lives with wife; No pets   He is very active   He was in the [employer] for 4 years. He gets meds from the [place]   Activity: walking daily since back surgery   Diet: good water, not much fruits/vegetables    Family History  Problem Relation Age of Onset  . Cancer Father 34    colon    Allergies  Allergen Reactions  . Codeine     REACTION: vomiting  . Cyclobenzaprine Hcl     REACTION: vomiting  . Hydrocodone Nausea Only and Other (See Comments)    "crazy"  . Tramadol Other (See Comments)    woozy  . Valium Other (See Comments)    sedation    Medication list reviewed and updated in full in [place].  GEN: No fevers, chills. Nontoxic. Primarily MSK c/o today. MSK: Detailed in the HPI GI: tolerating PO intake without difficulty Neuro: No numbness, parasthesias, or tingling associated. Otherwise the pertinent positives of the ROS are noted above.   Objective:   BP 110/60 mmHg  Pulse 61  Temp(Src) 98.2 F (36.8 C) (Oral)  Ht 5' 9.5" (1.765 m)  Wt 161 lb 4 oz (73.143 kg)  BMI 23.48 kg/m2   GEN: WDWN, NAD, Non-toxic, Alert & Oriented x 3 HEENT: Atraumatic, Normocephalic.  Ears and Nose: No external deformity. EXTR: No clubbing/cyanosis/edema NEURO: Normal gait.  PSYCH: Normally interactive. Conversant. Not depressed or anxious appearing.  Calm demeanor.   L elbow Ecchymosis or edema: neg ROM: full flexion, extension, pronation, supination LARGE BOGGY OLECRANON BURSA Shoulder ROM: Full Flexion:  5/5 Extension: 5/5 Supination: 5/5  Pronation: 5/5 Wrist ext: 5/5 Wrist flexion: 5/5 No gross bony abnormality Varus and Valgus stress: stable ECRB tenderness: neg Medial epicondyle: NT Lateral epicondyle, resisted wrist extension from wrist full pronation and flexion: NT grip: 5/5  sensation intact Tinel's, Elbow: negative   Radiology: No results found.  Assessment and Plan:   Olecranon bursitis of left elbow  Classic L olecranon bursitis without trauma known, unclear cause  Olecranon bursitis: Discussed the anatomy involved. Often from trauma or mechanical irritation.   Consideration of aspiration is reasonable. Discussed with the patient that aspiration and injection has risk of infection. There is also a significant failure rate. Bursectomy is the definitive surgical treatment if the patient does poorly long term.  ACE bandage for compression over the next  week. Elbow pad for any repetitive motion resting at the elbow if continues  Olecranon Bursa Aspiration, LEFT Verbal consent was obtained. Risks, benefits, and alternatives discussed. Potential complications including loss of pigment and atrophy were discussed and risk of infection. Discussed with the patient that risk of infection is higher with injection of corticosteroid into this superficial bursa. Prepped with Chloraprep and Ethyl Chloride used for anesthesia. Under sterile conditions, the effected olecranon bursa was aspirated with an 18 gauge needle. 15 amount of bloody, serosanguinous fluid was aspirated. No corticosteroid was injected.   A compression bandage was applied and the patient was instructed to continue for 72 hours.   Follow-up: prn  Signed,  Denajah Farias T. Caster Fayette, MD   Patient's Medications  New Prescriptions   No medications on file  Previous Medications   ALBUTEROL (PROVENTIL HFA;VENTOLIN HFA) 108 (90 BASE) MCG/ACT INHALER    Inhale 2 puffs into the lungs every 6 (six) hours as needed for  wheezing or shortness of breath.   ASPIRIN 81 MG TABLET    Take 81 mg by mouth daily.    ISOSORBIDE MONONITRATE (IMDUR) 30 MG 24 HR TABLET    Take one-half tablet by  mouth daily   MAGNESIUM HYDROXIDE (MILK OF MAGNESIA) 400 MG/5ML SUSPENSION    Take 15 mLs by mouth daily as needed for moderate constipation.   METFORMIN (GLUCOPHAGE) 500 MG TABLET    Take 1 tablet by mouth two  times daily with meals   METOPROLOL TARTRATE (LOPRESSOR) 25 MG TABLET    Take one-half tablet by  mouth two times daily   NITROGLYCERIN (NITROSTAT) 0.4 MG SL TABLET    Place 1 tablet (0.4 mg total) under the tongue every 5 (five) minutes as needed. For chest pain-may repeat x3   OMEPRAZOLE (PRILOSEC) 40 MG CAPSULE    Take 1 capsule by mouth  daily   POLYETHYLENE GLYCOL POWDER (GLYCOLAX/MIRALAX) POWDER    Take 8.5 g by mouth daily.   SIMVASTATIN (ZOCOR) 40 MG TABLET    Take 1 tablet by mouth at  bedtime  Modified Medications   No medications on file  Discontinued Medications   No medications on file

## 2015-08-07 ENCOUNTER — Encounter: Payer: Self-pay | Admitting: Family Medicine

## 2015-08-07 ENCOUNTER — Ambulatory Visit (INDEPENDENT_AMBULATORY_CARE_PROVIDER_SITE_OTHER): Payer: Medicare Other | Admitting: Family Medicine

## 2015-08-07 VITALS — BP 140/60 | HR 89 | Temp 98.3°F | Ht 69.5 in | Wt 161.5 lb

## 2015-08-07 DIAGNOSIS — M7022 Olecranon bursitis, left elbow: Secondary | ICD-10-CM

## 2015-08-07 MED ORDER — METHYLPREDNISOLONE ACETATE 40 MG/ML IJ SUSP
20.0000 mg | Freq: Once | INTRAMUSCULAR | Status: AC
  Administered 2015-08-07: 20 mg via INTRAMUSCULAR

## 2015-08-07 MED ORDER — CEPHALEXIN 500 MG PO CAPS: 1000.0000 mg | ORAL_CAPSULE | Freq: Two times a day (BID) | ORAL | Status: DC

## 2015-08-07 NOTE — Progress Notes (Signed)
Pre visit review using our clinic review tool, if applicable. No additional management support is needed unless otherwise documented below in the visit note. 

## 2015-08-07 NOTE — Progress Notes (Signed)
Dr. Frederico Hamman T. Moe Brier, MD, [facility] [place], [address] Phone: [phone] Fax: [phone]  08/07/2015  Patient: Troy [name], MRN: [id number], DOB: September 21, 1932, 81 y.o.  Primary Physician:  [name], MD   Chief Complaint  Patient presents with  . Joint Swelling    Left Elbow   Subjective:   Troy [name] is a 81 y.o. very pleasant male patient who presents with the following:  L olecranon bursitis. I saw the patient  Last week, and we Drained his bursitis.  I placed him in some compression with an Ace bandage wrap, but the body olecranon bursa has returned.  He is here with his wife, and I go over the history and details regarding olecranon bursitis in greater detail with her also.  Drain 20 cc   inj 1/2 cc DM  07/31/2015 Last OV with Owens Loffler, MD  1 week olecranon bursitis. No known injury or trauma. No prior. No repetitive motion at  All - all on the LEFT side. No pain, no redness or warmth.   inj  No steroids  Past Medical History, Surgical History, Social History, Family History, Problem List, Medications, and Allergies have been reviewed and updated if relevant.  Patient Active Problem List   Diagnosis Date Noted  . Bleeding hemorrhoid 06/13/2015  . Memory deficit 06/13/2015  . Paresthesia of left foot 02/14/2015  . COPD exacerbation ([place]) 02/14/2015  . Orthostatic dizziness 08/01/2014  . Paresthesia of foot, bilateral 08/01/2014  . Mid back pain on right side 03/28/2014  . Chest wall pain 03/12/2013  . Supraventricular tachycardia ([place]) 11/24/2012  . CAD (coronary artery disease) 11/24/2012  . Loss of weight 10/26/2012  . Lumbosacral radiculopathy at L5 12/23/2011  . Thrombocytopenia ([place])   . Barrett's esophagus   . Skin rash 09/16/2011  . PUD (peptic ulcer disease) 05/16/2011  . Medicare annual wellness visit, subsequent 04/30/2011  . COPD (chronic obstructive  pulmonary disease) ([place]) 12/26/2010  . OTHER CHEST PAIN 10/04/2010  . Diabetes mellitus type 2 with complications ([place]) 99991111  . Chronic idiopathic constipation 07/17/2010  . HYPERCHOLESTEROLEMIA 05/13/2009  . Smoker 05/13/2009    Past Medical History  Diagnosis Date  . Coronary atherosclerosis of artery bypass graft   . HLD (hyperlipidemia)   . Tobacco use disorder   . Hypertrophy of prostate without urinary obstruction and other lower urinary tract symptoms (LUTS)   . Diabetes mellitus type II ~2008  . Gastrointestinal ulcer 04/2011    with hematemesis s/p EGD by [facility]  . Prostate nodule ~2009    Left-benign s/p eval Uro WNL ([place])  . Chronic obstructive pulmonary disease (COPD) ([facility])     spirometry with moderate COPD  . Barrett's esophagus     EGD 2012, rpt 2014, longterm PPI  . Thrombocytopenia ([place])   . HNP (herniated nucleus pulposus), lumbar 2013    with compression of L5 nerve root and foot drop, also with lumbar DDD s/p surgery  . GERD (gastroesophageal reflux disease)   . Paroxysmal SVT (supraventricular tachycardia) ([place])     a. 3/14 => converted in ED with adenosine to AFib => NSR;  b. Echo 4/14:  Mild LVH, mild FBSH, EF 55-60%, Gr 1 DD  . Chronic idiopathic constipation 07/17/2010    Past Surgical History  Procedure Laterality Date  . Coronary artery bypass graft  1999    Dr. Roxy Manns  . Hemorrhoid surgery    . Nose surgery    .  Rotator cuff repair  04/2010    right (but has bilat) Dr. Latanya Maudlin ([place])  . Colonoscopy  03/2009    small hemorrhoids, 2 polyps (hyperplastic and adenomatous), rpt 5 yrs (Dr. Barron Schmid) - pt declined rpt scheduling  . Esophagogastroduodenoscopy  05/16/2011    after hematemesis, no lesions found, consistent with barrett's esophagus rpt 2 yrs  . Lumbar laminectomy/decompression microdiscectomy  01/31/2012    Procedure: LUMBAR LAMINECTOMY/DECOMPRESSION MICRODISCECTOMY 1 LEVEL;  Surgeon: Winfield Cunas, MD;  Location: [facility];  Service: Neurosurgery;  Laterality: Left;  LEFT Lumbar Four-Five Diskectomy  . Esophagogastroduodenoscopy  06/2013    barrett's, small HH, recheck 2-3 yrs ([place])  . Colonoscopy  10/2014    2 hyperplastic polyp, hemmorhoids ([place])    Social History   Social History  . Marital Status: Married    Spouse Name: N/A  . Number of Children: N/A  . Years of Education: N/A   Occupational History  . Retired from [employer]    Social History Main Topics  . Smoking status: Current Every Day Smoker -- 0.50 packs/day for 60 years    Types: Cigarettes    Start date: 09/16/1952  . Smokeless tobacco: Current User    Types: Snuff     Comment: < 1 ppd; does dip sometimes  . Alcohol Use: Yes     Comment: Seldom  . Drug Use: No  . Sexual Activity: Not on file   Other Topics Concern  . Not on file   Social History Narrative   Lives with wife; No pets   He is very active   He was in the [employer] for 4 years. He gets meds from the [place]   Activity: walking daily since back surgery   Diet: good water, not much fruits/vegetables    Family History  Problem Relation Age of Onset  . Cancer Father 18    colon    Allergies  Allergen Reactions  . Codeine     REACTION: vomiting  . Cyclobenzaprine Hcl     REACTION: vomiting  . Hydrocodone Nausea Only and Other (See Comments)    "crazy"  . Tramadol Other (See Comments)    woozy  . Valium Other (See Comments)    sedation    Medication list reviewed and updated in full in [facility].  GEN: No fevers, chills. Nontoxic. Primarily MSK c/o today. MSK: Detailed in the HPI GI: tolerating PO intake without difficulty Neuro: No numbness, parasthesias, or tingling associated. Otherwise the pertinent positives of the ROS are noted above.   Objective:   BP 140/60 mmHg  Pulse 89  Temp(Src) 98.3 F (36.8 C) (Oral)  Ht 5' 9.5" (1.765 m)  Wt 161 lb 8 oz (73.256 kg)  BMI 23.52 kg/m2   GEN: WDWN, NAD, Non-toxic, Alert & Oriented x  3 HEENT: Atraumatic, Normocephalic.  Ears and Nose: No external deformity. EXTR: No clubbing/cyanosis/edema NEURO: Normal gait.  PSYCH: Normally interactive. Conversant. Not depressed or anxious appearing.  Calm demeanor.   L elbow Ecchymosis or edema: neg ROM: full flexion, extension, pronation, supination LARGE BOGGY OLECRANON BURSA Shoulder ROM: Full Flexion: 5/5 Extension: 5/5 Supination: 5/5  Pronation: 5/5 Wrist ext: 5/5 Wrist flexion: 5/5 No gross bony abnormality Varus and Valgus stress: stable ECRB tenderness: neg Medial epicondyle: NT Lateral epicondyle, resisted wrist extension from wrist full pronation and flexion: NT grip: 5/5  sensation intact Tinel's, Elbow: negative   Radiology: No results found.  Assessment and Plan:   Olecranon  bursitis of left elbow - Plan: methylPREDNISolone acetate (DEPO-MEDROL) injection 20 mg  Olecranon bursitis: Discussed the anatomy involved. Often from trauma or mechanical irritation.   Consideration of aspiration is reasonable. Discussed with the patient that aspiration and injection has risk of infection. There is also a significant failure rate. Bursectomy is the definitive surgical treatment if the patient does poorly long term.  ACE bandage for compression over the next week. Elbow pad for any repetitive motion resting at the elbow.   Olecranon Bursa Aspiration, L Verbal consent was obtained. Risks, benefits, and alternatives discussed. Potential complications including loss of pigment and atrophy were discussed and risk of infection. Discussed with the patient that risk of infection is higher with injection of corticosteroid into this superficial bursa. Prepped with Chloraprep and Ethyl Chloride used for anesthesia. Under sterile conditions, the effected olecranon bursa was aspirated with an 18 gauge needle. 20 cc amount of serosanguinous tinged with blood fluid was aspirated. 1/2 mL of depo-medrol 40 mg injected.  A  compression bandage was applied and the patient was instructed to continue for 72 hours. With increased risk of infection with this location, i placed him on 7 days of keflex prophylactically.  Follow-up: No Follow-up on file.  New Prescriptions   CEPHALEXIN (KEFLEX) 500 MG CAPSULE    Take 2 capsules (1,000 mg total) by mouth 2 (two) times daily.   Modified Medications   No medications on file   No orders of the defined types were placed in this encounter.    Signed,  Maud Deed. Jamesmichael Shadd, MD   Patient's Medications  New Prescriptions   CEPHALEXIN (KEFLEX) 500 MG CAPSULE    Take 2 capsules (1,000 mg total) by mouth 2 (two) times daily.  Previous Medications   ALBUTEROL (PROVENTIL HFA;VENTOLIN HFA) 108 (90 BASE) MCG/ACT INHALER    Inhale 2 puffs into the lungs every 6 (six) hours as needed for wheezing or shortness of breath.   ASPIRIN 81 MG TABLET    Take 81 mg by mouth daily.    ISOSORBIDE MONONITRATE (IMDUR) 30 MG 24 HR TABLET    Take one-half tablet by  mouth daily   MAGNESIUM HYDROXIDE (MILK OF MAGNESIA) 400 MG/5ML SUSPENSION    Take 15 mLs by mouth daily as needed for moderate constipation.   METFORMIN (GLUCOPHAGE) 500 MG TABLET    Take 1 tablet by mouth two  times daily with meals   METOPROLOL TARTRATE (LOPRESSOR) 25 MG TABLET    Take one-half tablet by  mouth two times daily   NITROGLYCERIN (NITROSTAT) 0.4 MG SL TABLET    Place 1 tablet (0.4 mg total) under the tongue every 5 (five) minutes as needed. For chest pain-may repeat x3   OMEPRAZOLE (PRILOSEC) 40 MG CAPSULE    Take 1 capsule by mouth  daily   POLYETHYLENE GLYCOL POWDER (GLYCOLAX/MIRALAX) POWDER    Take 8.5 g by mouth daily.   SIMVASTATIN (ZOCOR) 40 MG TABLET    Take 1 tablet by mouth at  bedtime  Modified Medications   No medications on file  Discontinued Medications   No medications on file

## 2015-08-15 ENCOUNTER — Other Ambulatory Visit: Payer: Self-pay

## 2015-08-15 DIAGNOSIS — R739 Hyperglycemia, unspecified: Secondary | ICD-10-CM

## 2015-08-16 ENCOUNTER — Other Ambulatory Visit (INDEPENDENT_AMBULATORY_CARE_PROVIDER_SITE_OTHER): Payer: Medicare Other

## 2015-08-16 DIAGNOSIS — R7309 Other abnormal glucose: Secondary | ICD-10-CM

## 2015-08-16 DIAGNOSIS — R739 Hyperglycemia, unspecified: Secondary | ICD-10-CM

## 2015-08-16 LAB — MICROALBUMIN / CREATININE URINE RATIO
Creatinine,U: 115.4 mg/dL
MICROALB UR: 1.1 mg/dL (ref 0.0–1.9)
MICROALB/CREAT RATIO: 1 mg/g (ref 0.0–30.0)

## 2015-08-17 DIAGNOSIS — I700 Atherosclerosis of aorta: Secondary | ICD-10-CM

## 2015-08-17 DIAGNOSIS — I708 Atherosclerosis of other arteries: Secondary | ICD-10-CM | POA: Insufficient documentation

## 2015-08-17 HISTORY — DX: Atherosclerosis of aorta: I70.0

## 2015-08-17 HISTORY — DX: Atherosclerosis of other arteries: I70.8

## 2015-08-21 ENCOUNTER — Ambulatory Visit (INDEPENDENT_AMBULATORY_CARE_PROVIDER_SITE_OTHER): Payer: Medicare Other | Admitting: Family Medicine

## 2015-08-21 ENCOUNTER — Encounter: Payer: Self-pay | Admitting: Family Medicine

## 2015-08-21 ENCOUNTER — Ambulatory Visit (INDEPENDENT_AMBULATORY_CARE_PROVIDER_SITE_OTHER)
Admission: RE | Admit: 2015-08-21 | Discharge: 2015-08-21 | Disposition: A | Discharge status: Home / Self Care | Payer: Medicare Other | Source: Ambulatory Visit | Attending: Family Medicine | Admitting: Family Medicine

## 2015-08-21 VITALS — BP 138/64 | HR 60 | Temp 97.7°F | Wt 160.5 lb

## 2015-08-21 DIAGNOSIS — M545 Low back pain, unspecified: Secondary | ICD-10-CM

## 2015-08-21 DIAGNOSIS — M47813 Spondylosis without myelopathy or radiculopathy, cervicothoracic region: Secondary | ICD-10-CM | POA: Diagnosis not present

## 2015-08-21 HISTORY — DX: Atherosclerosis of other arteries: I70.8

## 2015-08-21 HISTORY — DX: Atherosclerosis of aorta: I70.0

## 2015-08-21 MED ORDER — DEXAMETHASONE SODIUM PHOSPHATE 10 MG/ML IJ SOLN
10.0000 mg | Freq: Once | INTRAMUSCULAR | Status: AC
  Administered 2015-08-21: 10 mg via INTRAMUSCULAR

## 2015-08-21 NOTE — Assessment & Plan Note (Addendum)
In h/o HNP with marked pain and subjective weakness, start with lumbar films - looks like progression of arthritis upper lumbar vertebrae as well as decreased disc space upper lumbar vertebrae.  No significant neurological red flags on exam today - symmetrical decreased DTRs noted. Treat with 10mg  dexamethasone as well as limited NSAIDs for pain.  Avoid narcotics given intolerance and significant constipation issues.  Avoid muscle relaxants due to previous sensitivity to these meds.

## 2015-08-21 NOTE — Progress Notes (Signed)
BP 138/64 mmHg  Pulse 60  Temp(Src) 97.7 F (36.5 C) (Oral)  Wt 160 lb 8 oz (72.802 kg)   CC: back pain   Subjective:    Patient ID: Troy [name], male    DOB: [birth year], 81 y.o.   MRN: [id number]  HPI: Troy [name] is a 81 y.o. male presenting on 08/21/2015 for Back Pain   1 wk h/o back pain that radiates down legs leg. Started after reaching up to grab something in yard on riding lawnmower, thinks twisted wrong. Pain starts midline lower back, shooting pain down left leg but unable to specifically tell where. No groin pain. Denies leg numbness. + left leg weakness. No bowel/bladder accidents. Laying down improves pain. Standing and sitting worsens pain. Yesterday took 600mg  ibuprofen and heating pad helped   Known HNP with compression of left L5 nerve root resulting in foot drop s/p lumbar microdiscectomy surgery 2013. Also with known lumbar DDD.  Suffers from chronic constipation. Recently seen for olecranon bursitis s/p aspiration x2 and steroid injection x1 by Dr Lorelei Pont. This is significantly better.  Relevant past medical, surgical, family and social history reviewed and updated as indicated. Interim medical history since our last visit reviewed. Allergies and medications reviewed and updated. Current Outpatient Prescriptions on File Prior to Visit  Medication Sig  . albuterol (PROVENTIL HFA;VENTOLIN HFA) 108 (90 BASE) MCG/ACT inhaler Inhale 2 puffs into the lungs every 6 (six) hours as needed for wheezing or shortness of breath.  [name] aspirin 81 MG tablet Take 81 mg by mouth daily.   . isosorbide mononitrate (IMDUR) 30 MG 24 hr tablet Take one-half tablet by  mouth daily  . magnesium hydroxide (MILK OF MAGNESIA) 400 MG/5ML suspension Take 15 mLs by mouth daily as needed for moderate constipation.  . metFORMIN (GLUCOPHAGE) 500 MG tablet Take 1 tablet by mouth two  times daily with meals  . metoprolol tartrate (LOPRESSOR) 25 MG tablet Take one-half tablet by  mouth two  times daily  . nitroGLYCERIN (NITROSTAT) 0.4 MG SL tablet Place 1 tablet (0.4 mg total) under the tongue every 5 (five) minutes as needed. For chest pain-may repeat x3  . omeprazole (PRILOSEC) 40 MG capsule Take 1 capsule by mouth  daily  . polyethylene glycol powder (GLYCOLAX/MIRALAX) powder Take 8.5 g by mouth daily.  . simvastatin (ZOCOR) 40 MG tablet Take 1 tablet by mouth at  bedtime   No current facility-administered medications on file prior to visit.    Review of Systems Per HPI unless specifically indicated in ROS section     Objective:    BP 138/64 mmHg  Pulse 60  Temp(Src) 97.7 F (36.5 C) (Oral)  Wt 160 lb 8 oz (72.802 kg)  Wt Readings from Last 3 Encounters:  08/21/15 160 lb 8 oz (72.802 kg)  08/07/15 161 lb 8 oz (73.256 kg)  07/31/15 161 lb 4 oz (73.143 kg)    Physical Exam  Constitutional: He is oriented to person, place, and time. He appears well-developed and well-nourished. No distress.  Musculoskeletal: He exhibits no edema.  Marked pain midline upper lumbar spine + lumbar paraspinous mm tenderness + SLR bilaterally (severe pain but limited to lower back). No pain with int/ext rotation at hip. No pain at SIJ, GTB or sciatic notch bilaterally.   Neurological: He is alert and oriented to person, place, and time. He has normal strength. No sensory deficit.  Reflex Scores:      Patellar reflexes are 0 on the right side  and 0 on the left side.      Achilles reflexes are 0 on the right side and 0 on the left side. Antalgic gait Diminished reflexes BLE 5/5 strength BLE  Skin: Skin is warm and dry. No rash noted.  Nursing note and vitals reviewed.  Results for orders placed or performed in visit on 08/16/15  Urine Microalbumin w/creat. ratio  Result Value Ref Range   Microalb, Ur 1.1 0.0 - 1.9 mg/dL   Creatinine,U 115.4 mg/dL   Microalb Creat Ratio 1.0 0.0 - 30.0 mg/g   LUMBAR SPINE - 2-3 VIEW Comparison: 01/25/2012 MRI  Findings: Radiopaque marker  projects posterior to the L4-L5 interspace on the first image. On the second image, radiopaque marker projects posterior to the L4 vertebral body at the level of the inferior endplate.  IMPRESSION: Intraoperative imaging as above.  Original Report Authenticated By: Arline Asp, [name]    Assessment & Plan:   Problem List Items Addressed This Visit    Acute low back pain - Primary    In h/o HNP with marked pain and subjective weakness, start with lumbar films - looks like progression of arthritis upper lumbar vertebrae as well as decreased disc space upper lumbar vertebrae.  No significant neurological red flags on exam today - symmetrical decreased DTRs noted. Treat with 10mg  dexamethasone as well as limited NSAIDs for pain.  Avoid narcotics given intolerance and significant constipation issues.  Avoid muscle relaxants due to previous sensitivity to these meds.       Relevant Orders   DG Lumbar Spine Complete       Follow up plan: Return if symptoms worsen or fail to improve.

## 2015-08-21 NOTE — Progress Notes (Signed)
Pre visit review using our clinic review tool, if applicable. No additional management support is needed unless otherwise documented below in the visit note. 

## 2015-08-21 NOTE — Patient Instructions (Addendum)
Lower back xrays today - possible recurrent herniated disc at upper lumbar spine. Dexamethasone 10mg  IM injection today for inflammation - watch starches after steroid shot as this can raise your sugar levels. Continue heating pad, rest. Start aleve 220mg  twice daily with food for next 5 days.  If no improvement over next 1 week let me know for MRI. If worsening, please let me know sooner.

## 2015-08-21 NOTE — Addendum Note (Signed)
Addended by: Royann Shivers A on: 08/21/2015 12:16 PM   Modules accepted: Orders

## 2015-08-24 ENCOUNTER — Telehealth: Payer: Self-pay | Admitting: Family Medicine

## 2015-08-24 DIAGNOSIS — Z9889 Other specified postprocedural states: Secondary | ICD-10-CM

## 2015-08-24 DIAGNOSIS — M5417 Radiculopathy, lumbosacral region: Secondary | ICD-10-CM

## 2015-08-24 DIAGNOSIS — M545 Low back pain, unspecified: Secondary | ICD-10-CM

## 2015-08-24 DIAGNOSIS — E118 Type 2 diabetes mellitus with unspecified complications: Secondary | ICD-10-CM

## 2015-08-24 DIAGNOSIS — E7800 Pure hypercholesterolemia, unspecified: Secondary | ICD-10-CM

## 2015-08-24 NOTE — Telephone Encounter (Signed)
Spoke with patient. He said back pain is 1000x worse than when he was in earlier. He said he can't sit, stand or lay down. Injection didn't help at all. He said he needs help NOW!

## 2015-08-24 NOTE — Telephone Encounter (Signed)
I'm sorry no better.  I have ordered MRI lumbar spine for further evaluation. Increase aleve to 2 tablets twice daily with food. Let us know if no improvement with higher aleve dose. We can try stronger pain med (narcotic) but he has had bad effect on all these medications in the past.

## 2015-08-24 NOTE — Telephone Encounter (Signed)
Pt called back wanting a call back Regarding his back  He cannot move or lay down the pain is so bad Best number [phone]

## 2015-08-24 NOTE — Telephone Encounter (Signed)
Patient notified and verbalized understanding. He will await call to schedule MRI.

## 2015-08-24 NOTE — Telephone Encounter (Signed)
Please call patient back about his back.  He'll only speak to [employer].

## 2015-08-25 ENCOUNTER — Other Ambulatory Visit (INDEPENDENT_AMBULATORY_CARE_PROVIDER_SITE_OTHER): Payer: Medicare Other

## 2015-08-25 DIAGNOSIS — E118 Type 2 diabetes mellitus with unspecified complications: Secondary | ICD-10-CM

## 2015-08-25 DIAGNOSIS — E7800 Pure hypercholesterolemia, unspecified: Secondary | ICD-10-CM

## 2015-08-25 LAB — BASIC METABOLIC PANEL
BUN: 29 mg/dL — AB (ref 6–23)
CHLORIDE: 105 meq/L (ref 96–112)
CO2: 27 mEq/L (ref 19–32)
CREATININE: 1.32 mg/dL (ref 0.40–1.50)
Calcium: 10 mg/dL (ref 8.4–10.5)
GFR: 55.45 mL/min — AB (ref >60.00)
GLUCOSE: 107 mg/dL — AB (ref 70–99)
POTASSIUM: 4.9 meq/L (ref 3.5–5.1)
Sodium: 140 mEq/L (ref 135–145)

## 2015-08-25 LAB — LDL CHOLESTEROL, DIRECT: Direct LDL: 74 mg/dL

## 2015-08-25 LAB — HEMOGLOBIN A1C: Hgb A1c MFr Bld: 6.6 % — ABNORMAL HIGH (ref 4.6–6.5)

## 2015-08-25 NOTE — Addendum Note (Signed)
Addended by: [name] on: 08/25/2015 09:05 AM   Modules accepted: Orders

## 2015-08-25 NOTE — Telephone Encounter (Signed)
Labs ordered for MRI per Dr. Darnell Level

## 2015-08-25 NOTE — Telephone Encounter (Signed)
Patient scheduled for MRI tomorrow 08/26/15 at 11:00 at  [facility], will come in here today for Bun/Creat in order to have MRI.

## 2015-08-25 NOTE — Addendum Note (Signed)
Addended by: Royann Shivers A on: 08/25/2015 09:51 AM   Modules accepted: Orders

## 2015-08-25 NOTE — Addendum Note (Signed)
Addended by: [name] on: 08/25/2015 10:10 AM   Modules accepted: Orders

## 2015-08-26 ENCOUNTER — Ambulatory Visit
Admission: RE | Admit: 2015-08-26 | Discharge: 2015-08-26 | Disposition: A | Discharge status: Home / Self Care | Payer: Medicare Other | Source: Ambulatory Visit | Attending: Family Medicine | Admitting: Family Medicine

## 2015-08-26 DIAGNOSIS — M545 Low back pain, unspecified: Secondary | ICD-10-CM

## 2015-08-26 DIAGNOSIS — Z9889 Other specified postprocedural states: Secondary | ICD-10-CM

## 2015-08-26 DIAGNOSIS — M5126 Other intervertebral disc displacement, lumbar region: Secondary | ICD-10-CM | POA: Diagnosis not present

## 2015-08-26 DIAGNOSIS — M5417 Radiculopathy, lumbosacral region: Secondary | ICD-10-CM

## 2015-08-26 MED ORDER — GADOBENATE DIMEGLUMINE 529 MG/ML IV SOLN
13.0000 mL | Freq: Once | INTRAVENOUS | Status: AC | PRN
  Administered 2015-08-26: 13 mL via INTRAVENOUS

## 2015-08-28 ENCOUNTER — Encounter: Payer: Self-pay | Admitting: *Deleted

## 2015-08-31 ENCOUNTER — Other Ambulatory Visit: Payer: Self-pay | Admitting: Family Medicine

## 2015-09-12 ENCOUNTER — Encounter: Payer: Self-pay | Admitting: Family Medicine

## 2015-09-12 ENCOUNTER — Ambulatory Visit (INDEPENDENT_AMBULATORY_CARE_PROVIDER_SITE_OTHER): Payer: Medicare Other | Admitting: Family Medicine

## 2015-09-12 VITALS — BP 110/60 | HR 68 | Temp 98.3°F | Wt 162.5 lb

## 2015-09-12 DIAGNOSIS — J029 Acute pharyngitis, unspecified: Secondary | ICD-10-CM

## 2015-09-12 DIAGNOSIS — J028 Acute pharyngitis due to other specified organisms: Principal | ICD-10-CM

## 2015-09-12 DIAGNOSIS — B9789 Other viral agents as the cause of diseases classified elsewhere: Principal | ICD-10-CM

## 2015-09-12 LAB — POCT INFLUENZA A/B
INFLUENZA B, POC: NEGATIVE
Influenza A, POC: NEGATIVE

## 2015-09-12 NOTE — Progress Notes (Signed)
BP 110/60 mmHg  Pulse 68  Temp(Src) 98.3 F (36.8 C) (Oral)  Wt 162 lb 8 oz (73.71 kg)   CC: ST  Subjective:    Patient ID: Troy [name], male    DOB: 10/23/1932, 81 y.o.   MRN: [id number]  HPI: Troy [name] is a 81 y.o. male presenting on 09/12/2015 for Sore Throat   1 wk h/o body aches, ST, staying cold. + head achey. + cough productive of white mucous. + dyspneic but denies wheezing.  No fevers, ear or tooth pain, PNDrainage.   Treating with listerine and peroxide.  No sick contacts at home. Known COPD continued smoker.   Relevant past medical, surgical, family and social history reviewed and updated as indicated. Interim medical history since our last visit reviewed. Allergies and medications reviewed and updated. Current Outpatient Prescriptions on File Prior to Visit  Medication Sig  . albuterol (PROVENTIL HFA;VENTOLIN HFA) 108 (90 BASE) MCG/ACT inhaler Inhale 2 puffs into the lungs every 6 (six) hours as needed for wheezing or shortness of breath.  [name] aspirin 81 MG tablet Take 81 mg by mouth daily.   . isosorbide mononitrate (IMDUR) 30 MG 24 hr tablet Take one-half tablet by  mouth daily  . magnesium hydroxide (MILK OF MAGNESIA) 400 MG/5ML suspension Take 15 mLs by mouth daily as needed for moderate constipation.  . metFORMIN (GLUCOPHAGE) 500 MG tablet Take 1 tablet by mouth two  times daily with meals  . metoprolol tartrate (LOPRESSOR) 25 MG tablet Take one-half tablet by  mouth twice a day  . omeprazole (PRILOSEC) 40 MG capsule Take 1 capsule by mouth  daily  . polyethylene glycol powder (GLYCOLAX/MIRALAX) powder Take 8.5 g by mouth daily.  . simvastatin (ZOCOR) 40 MG tablet Take 1 tablet by mouth at  bedtime  . nitroGLYCERIN (NITROSTAT) 0.4 MG SL tablet Place 1 tablet (0.4 mg total) under the tongue every 5 (five) minutes as needed. For chest pain-may repeat x3 (Patient not taking: Reported on 09/12/2015)   No current facility-administered medications on file  prior to visit.    Review of Systems Per HPI unless specifically indicated in ROS section     Objective:    BP 110/60 mmHg  Pulse 68  Temp(Src) 98.3 F (36.8 C) (Oral)  Wt 162 lb 8 oz (73.71 kg)  Wt Readings from Last 3 Encounters:  09/12/15 162 lb 8 oz (73.71 kg)  08/26/15 145 lb (65.772 kg)  08/21/15 160 lb 8 oz (72.802 kg)    Physical Exam  Constitutional: He appears well-developed and well-nourished. No distress.  HENT:  Head: Normocephalic and atraumatic.  Right Ear: Hearing, external ear and ear canal normal.  Left Ear: Hearing, external ear and ear canal normal.  Nose: Mucosal edema (and pallor) present. No rhinorrhea. Right sinus exhibits no maxillary sinus tenderness and no frontal sinus tenderness. Left sinus exhibits no maxillary sinus tenderness and no frontal sinus tenderness.  Mouth/Throat: Uvula is midline, oropharynx is clear and moist and mucous membranes are normal. No oropharyngeal exudate, posterior oropharyngeal edema, posterior oropharyngeal erythema or tonsillar abscesses.  Cerumen in canals bilaterally Post oropharynx clear  Eyes: Conjunctivae and EOM are normal. Pupils are equal, round, and reactive to light. No scleral icterus.  Neck: Normal range of motion. Neck supple.  Cardiovascular: Normal rate, regular rhythm, normal heart sounds and intact distal pulses.   No murmur heard. Pulmonary/Chest: Effort normal and breath sounds normal. No respiratory distress. He has no wheezes. He has no rales.  Distant but no significant wheezing. Normal work of breathing, pursed lips at baseline  Lymphadenopathy:    He has no cervical adenopathy.  Skin: Skin is warm and dry. No rash noted.  Nursing note and vitals reviewed.     Assessment & Plan:   Problem List Items Addressed This Visit    Acute viral pharyngitis - Primary    Anticipate viral given short duration. Supportive care as per instructions. Update if not improving over next 3-4 days. Pt agrees with  plan. Flu swab negative      Relevant Orders   POCT Influenza A/B (Completed)       Follow up plan: Return if symptoms worsen or fail to improve.

## 2015-09-12 NOTE — Progress Notes (Signed)
Pre visit review using our clinic review tool, if applicable. No additional management support is needed unless otherwise documented below in the visit note. 

## 2015-09-12 NOTE — Assessment & Plan Note (Addendum)
Anticipate viral given short duration. Supportive care as per instructions. Update if not improving over next 3-4 days. Pt agrees with plan. Flu swab negative

## 2015-09-12 NOTE — Patient Instructions (Addendum)
You have acute viral pharyngitis or sore throat. Push fluids and plenty of rest. Salt water gargles. Suck on cold things like popsicles or warm things like herbal teas (whichever soothes the throat better). Return if fever >101.5, worsening pain, or trouble opening/closing mouth, or hoarse voice. May also use chloraseptic spray over the counter for sore throat.

## 2015-11-13 DIAGNOSIS — H6122 Impacted cerumen, left ear: Secondary | ICD-10-CM | POA: Diagnosis not present

## 2015-11-13 DIAGNOSIS — J410 Simple chronic bronchitis: Secondary | ICD-10-CM | POA: Diagnosis not present

## 2015-11-13 DIAGNOSIS — J301 Allergic rhinitis due to pollen: Secondary | ICD-10-CM | POA: Diagnosis not present

## 2015-11-13 DIAGNOSIS — H6063 Unspecified chronic otitis externa, bilateral: Secondary | ICD-10-CM | POA: Diagnosis not present

## 2015-11-13 DIAGNOSIS — J342 Deviated nasal septum: Secondary | ICD-10-CM | POA: Diagnosis not present

## 2016-06-07 DIAGNOSIS — H6122 Impacted cerumen, left ear: Secondary | ICD-10-CM | POA: Diagnosis not present

## 2016-06-07 DIAGNOSIS — H6063 Unspecified chronic otitis externa, bilateral: Secondary | ICD-10-CM | POA: Diagnosis not present

## 2016-06-11 DIAGNOSIS — H6063 Unspecified chronic otitis externa, bilateral: Secondary | ICD-10-CM | POA: Diagnosis not present

## 2016-06-11 DIAGNOSIS — H6122 Impacted cerumen, left ear: Secondary | ICD-10-CM | POA: Diagnosis not present

## 2016-06-19 ENCOUNTER — Ambulatory Visit (INDEPENDENT_AMBULATORY_CARE_PROVIDER_SITE_OTHER): Payer: Medicare Other | Admitting: Family Medicine

## 2016-06-19 ENCOUNTER — Encounter: Payer: Self-pay | Admitting: Family Medicine

## 2016-06-19 VITALS — BP 150/62 | HR 95 | Temp 98.5°F | Wt 165.0 lb

## 2016-06-19 DIAGNOSIS — R6883 Chills (without fever): Secondary | ICD-10-CM

## 2016-06-19 DIAGNOSIS — R059 Cough, unspecified: Secondary | ICD-10-CM

## 2016-06-19 DIAGNOSIS — M791 Myalgia, unspecified site: Secondary | ICD-10-CM

## 2016-06-19 DIAGNOSIS — R05 Cough: Secondary | ICD-10-CM

## 2016-06-19 DIAGNOSIS — J069 Acute upper respiratory infection, unspecified: Secondary | ICD-10-CM | POA: Diagnosis not present

## 2016-06-19 LAB — POCT INFLUENZA A/B
INFLUENZA B, POC: NEGATIVE
Influenza A, POC: NEGATIVE

## 2016-06-19 MED ORDER — AZITHROMYCIN 250 MG PO TABS: ORAL_TABLET | ORAL | 0 refills | Status: DC

## 2016-06-19 MED ORDER — ACETAMINOPHEN 500 MG PO TABS
1000.0000 mg | ORAL_TABLET | Freq: Once | ORAL | Status: AC
  Administered 2016-06-19: 1000 mg via ORAL

## 2016-06-19 NOTE — Progress Notes (Signed)
Subjective:    Patient ID: Troy [name], male    DOB: 1932-10-28, 82 y.o.   MRN: [id number]  HPI This is a 82 yo male who presents today with 4 days of sore throat, muscle aches, no fever,  + chills, cough with foamy white sputum. Some SOB, no wheeze. Head hurts. No ear pain. Wife is in the hospital, went in 2 days ago with ? Pneumonia. Feels like he was hit by a truck. + smoker.  Past Medical History:  Diagnosis Date  . Aorto-iliac atherosclerosis ([place]) 08/2015   by xray  . Barrett's esophagus    EGD 2012, rpt 2014, longterm PPI  . Chronic idiopathic constipation 07/17/2010  . Chronic obstructive pulmonary disease (COPD) ([facility])    spirometry with moderate COPD  . Coronary atherosclerosis of artery bypass graft   . Diabetes mellitus type II ~2008  . Gastrointestinal ulcer 04/2011   with hematemesis s/p EGD by [facility]  . GERD (gastroesophageal reflux disease)   . HLD (hyperlipidemia)   . HNP (herniated nucleus pulposus), lumbar 2013   with compression of L5 nerve root and foot drop, also with lumbar DDD s/p surgery  . Hypertrophy of prostate without urinary obstruction and other lower urinary tract symptoms (LUTS)   . Paroxysmal SVT (supraventricular tachycardia) ([place])    a. 3/14 => converted in ED with adenosine to AFib => NSR;  b. Echo 4/14:  Mild LVH, mild FBSH, EF 55-60%, Gr 1 DD  . Prostate nodule ~2009   Left-benign s/p eval Uro WNL ([place])  . Thrombocytopenia ([place])   . Tobacco use disorder    Past Surgical History:  Procedure Laterality Date  . COLONOSCOPY  03/2009   small hemorrhoids, 2 polyps (hyperplastic and adenomatous), rpt 5 yrs (Dr. Barron Schmid) - pt declined rpt scheduling  . COLONOSCOPY  10/2014   2 hyperplastic polyp, hemmorhoids ([place])  . CORONARY ARTERY BYPASS GRAFT  1999   Dr. Roxy Manns  . ESOPHAGOGASTRODUODENOSCOPY  05/16/2011   after hematemesis, no lesions found, consistent with barrett's esophagus rpt 2 yrs  . ESOPHAGOGASTRODUODENOSCOPY  06/2013   barrett's, small HH, recheck 2-3 yrs ([place])  . HEMORRHOID SURGERY    . LUMBAR LAMINECTOMY/DECOMPRESSION MICRODISCECTOMY  01/31/2012   Procedure: LUMBAR LAMINECTOMY/DECOMPRESSION MICRODISCECTOMY 1 LEVEL;  Surgeon: Winfield Cunas, MD;  Location: [facility];  Service: Neurosurgery;  Laterality: Left;  LEFT Lumbar Four-Five Diskectomy  . NOSE SURGERY    . ROTATOR CUFF REPAIR  04/2010   right (but has bilat) Dr. Latanya Maudlin ([facility])   Family History  Problem Relation Age of Onset  . Cancer Father 24    colon   Social History  Substance Use Topics  . Smoking status: Current Every Day Smoker    Packs/day: 0.50    Years: 60.00    Types: Cigarettes    Start date: 09/16/1952  . Smokeless tobacco: Current User    Types: Snuff     Comment: < 1 ppd; does dip sometimes  . Alcohol use Yes     Comment: Seldom      Review of Systems Per HPI    Objective:   Physical Exam  Constitutional: He is oriented to person, place, and time. He appears well-developed and well-nourished. He appears ill.  HENT:  Head: Normocephalic and atraumatic.  Right Ear: Tympanic membrane and ear canal normal.  Left Ear: Tympanic membrane and ear canal normal.  Nose: Nose normal.  Mouth/Throat: Uvula is midline. Posterior oropharyngeal erythema present.  Post nasal discharge.  Cardiovascular: Normal rate, regular rhythm and normal heart sounds.   Pulmonary/Chest: Effort normal and breath sounds normal.  Neurological: He is alert and oriented to person, place, and time.  Skin: Skin is warm and dry.  Psychiatric: He has a normal mood and affect. His behavior is normal. Judgment and thought content normal.  Vitals reviewed.     BP (!) 150/62   Pulse 95   Temp 98.5 F (36.9 C)   Wt 165 lb (74.8 kg)   SpO2 98%   BMI 24.02 kg/m  Wt Readings from Last 3 Encounters:  06/19/16 165 lb (74.8 kg)  09/12/15 162 lb 8 oz (73.7 kg)  08/26/15 145 lb (65.8 kg)   Results for orders placed or performed in visit  on 09/12/15  POCT Influenza A/B  Result Value Ref Range   Influenza A, POC Negative Negative   Influenza B, POC Negative Negative        Assessment & Plan:  1. Chills - POCT Influenza A/B - acetaminophen (TYLENOL) tablet 1,000 mg; Take 2 tablets (1,000 mg total) by mouth once.  2. Myalgia - POCT Influenza A/B - acetaminophen (TYLENOL) tablet 1,000 mg; Take 2 tablets (1,000 mg total) by mouth once.  3. Cough - POCT Influenza A/B  4. Upper respiratory infection with cough and congestion - flu negative will go ahead and cover for URI with antibiotics given patient's age, wife's current hospitalization and smoking history. - RTC/ER precautions reviewed - azithromycin (ZITHROMAX) 250 MG tablet; Take two tablets today then one a day until finished  Dispense: 6 tablet; Refill: 0  - follow up with Dr. Danise Mina in 4-8 weeks for annual visit  Clarene Reamer, FNP-BC  [facility], [place] [facility]  06/19/2016 12:15 PM

## 2016-06-19 NOTE — Patient Instructions (Signed)
Please take tylenol 2 tablets every 8 to 12 hours for fever/pain For cough, can use over the counter Delsym Drink enough liquid so your urine is light yellow Please go to the emergency room if you develop difficulty breathing  Follow up with [place] if you are not better in 3-5 days or sooner if you feel worse   Upper Respiratory Infection, Adult Most upper respiratory infections (URIs) are a viral infection of the air passages leading to the lungs. A URI affects the nose, throat, and upper air passages. The most common type of URI is nasopharyngitis and is typically referred to as "the common cold." URIs run their course and usually go away on their own. Most of the time, a URI does not require medical attention, but sometimes a bacterial infection in the upper airways can follow a viral infection. This is called a secondary infection. Sinus and middle ear infections are common types of secondary upper respiratory infections. Bacterial pneumonia can also complicate a URI. A URI can worsen asthma and chronic obstructive pulmonary disease (COPD). Sometimes, these complications can require emergency medical care and may be life threatening.  CAUSES Almost all URIs are caused by viruses. A virus is a type of germ and can spread from one person to another.  RISKS FACTORS You may be at risk for a URI if:   You smoke.   You have chronic heart or lung disease.  You have a weakened defense (immune) system.   You are very young or very old.   You have nasal allergies or asthma.  You work in crowded or poorly ventilated areas.  You work in health care facilities or schools. SIGNS AND SYMPTOMS  Symptoms typically develop 2-3 days after you come in contact with a cold virus. Most viral URIs last 7-10 days. However, viral URIs from the influenza virus (flu virus) can last 14-18 days and are typically more severe. Symptoms may include:   Runny or stuffy (congested) nose.   Sneezing.   Cough.    Sore throat.   Headache.   Fatigue.   Fever.   Loss of appetite.   Pain in your forehead, behind your eyes, and over your cheekbones (sinus pain).  Muscle aches.  DIAGNOSIS  Your health care provider may diagnose a URI by:  Physical exam.  Tests to check that your symptoms are not due to another condition such as:  Strep throat.  Sinusitis.  Pneumonia.  Asthma. TREATMENT  A URI goes away on its own with time. It cannot be cured with medicines, but medicines may be prescribed or recommended to relieve symptoms. Medicines may help:  Reduce your fever.  Reduce your cough.  Relieve nasal congestion. HOME CARE INSTRUCTIONS   Take medicines only as directed by your health care provider.   Gargle warm saltwater or take cough drops to comfort your throat as directed by your health care provider.  Use a warm mist humidifier or inhale steam from a shower to increase air moisture. This may make it easier to breathe.  Drink enough fluid to keep your urine clear or pale yellow.   Eat soups and other clear broths and maintain good nutrition.   Rest as needed.   Return to work when your temperature has returned to normal or as your health care provider advises. You may need to stay home longer to avoid infecting others. You can also use a face mask and careful hand washing to prevent spread of the virus.  Increase the  usage of your inhaler if you have asthma.   Do not use any tobacco products, including cigarettes, chewing tobacco, or electronic cigarettes. If you need help quitting, ask your health care provider. PREVENTION  The best way to protect yourself from getting a cold is to practice good hygiene.   Avoid oral or hand contact with people with cold symptoms.   Wash your hands often if contact occurs.  There is no clear evidence that vitamin C, vitamin E, echinacea, or exercise reduces the chance of developing a cold. However, it is always  recommended to get plenty of rest, exercise, and practice good nutrition.  SEEK MEDICAL CARE IF:   You are getting worse rather than better.   Your symptoms are not controlled by medicine.   You have chills.  You have worsening shortness of breath.  You have brown or red mucus.  You have yellow or brown nasal discharge.  You have pain in your face, especially when you bend forward.  You have a fever.  You have swollen neck glands.  You have pain while swallowing.  You have white areas in the back of your throat. SEEK IMMEDIATE MEDICAL CARE IF:   You have severe or persistent:  Headache.  Ear pain.  Sinus pain.  Chest pain.  You have chronic lung disease and any of the following:  Wheezing.  Prolonged cough.  Coughing up blood.  A change in your usual mucus.  You have a stiff neck.  You have changes in your:  Vision.  Hearing.  Thinking.  Mood. MAKE SURE YOU:   Understand these instructions.  Will watch your condition.  Will get help right away if you are not doing well or get worse.   This information is not intended to replace advice given to you by your health care provider. Make sure you discuss any questions you have with your health care provider.   Document Released: 02/26/2001 Document Revised: 01/17/2015 Document Reviewed: 12/08/2013 Elsevier Interactive Patient Education [employer].

## 2016-06-21 ENCOUNTER — Telehealth: Payer: Self-pay | Admitting: Family Medicine

## 2016-06-21 ENCOUNTER — Ambulatory Visit (INDEPENDENT_AMBULATORY_CARE_PROVIDER_SITE_OTHER)
Admission: RE | Admit: 2016-06-21 | Discharge: 2016-06-21 | Disposition: A | Discharge status: Home / Self Care | Payer: Medicare Other | Source: Ambulatory Visit | Attending: Family Medicine | Admitting: Family Medicine

## 2016-06-21 ENCOUNTER — Encounter: Payer: Self-pay | Admitting: Family Medicine

## 2016-06-21 ENCOUNTER — Ambulatory Visit (INDEPENDENT_AMBULATORY_CARE_PROVIDER_SITE_OTHER): Payer: Medicare Other | Admitting: Family Medicine

## 2016-06-21 VITALS — BP 132/60 | HR 62 | Temp 98.2°F | Ht 69.5 in | Wt 165.2 lb

## 2016-06-21 DIAGNOSIS — R05 Cough: Secondary | ICD-10-CM | POA: Diagnosis not present

## 2016-06-21 DIAGNOSIS — R0602 Shortness of breath: Secondary | ICD-10-CM | POA: Diagnosis not present

## 2016-06-21 DIAGNOSIS — J441 Chronic obstructive pulmonary disease with (acute) exacerbation: Secondary | ICD-10-CM

## 2016-06-21 MED ORDER — DOXYCYCLINE HYCLATE 100 MG PO TABS: 100.0000 mg | ORAL_TABLET | Freq: Two times a day (BID) | ORAL | 0 refills | Status: DC

## 2016-06-21 MED ORDER — PREDNISONE 10 MG PO TABS: ORAL_TABLET | ORAL | 0 refills | Status: DC

## 2016-06-21 MED ORDER — ALBUTEROL SULFATE HFA 108 (90 BASE) MCG/ACT IN AERS: 2.0000 | INHALATION_SPRAY | Freq: Four times a day (QID) | RESPIRATORY_TRACT | 3 refills | Status: DC | PRN

## 2016-06-21 NOTE — Telephone Encounter (Signed)
Patient Name: Troy [name]  DOB: 06-May-1933    Initial Comment Caller states has headache, behind eyes, weak; wants to see doctor TODAY   Nurse Assessment  Nurse: Raphael Gibney, RN, Vanita Ingles Date/Time (Eastern Time): 06/21/2016 9:39:52 AM  Confirm and document reason for call. If symptomatic, describe symptoms. You must click the next button to save text entered. ---Caller states he has headache behind eyes. Has body aches. No fever. Has a cough.  Has the patient traveled out of the country within the last 30 days? ---No  Does the patient have any new or worsening symptoms? ---Yes  Will a triage be completed? ---Yes  Related visit to physician within the last 2 weeks? ---No  Does the PT have any chronic conditions? (i.e. diabetes, asthma, etc.) ---No  Is this a behavioral health or substance abuse call? ---No     Guidelines    Guideline Title Affirmed Question Affirmed Notes  Headache [1] New headache AND [2] age > 19    Final Disposition User   See Physician within 24 Hours Stringer, RN, Vanita Ingles    Comments  pt's spouse has appt at 2:30 pm today with Dr. Duane Lope and he would like to see him at the same time. Please call pt back and let him know   Referrals  REFERRED TO PCP OFFICE   Disagree/Comply: Comply

## 2016-06-21 NOTE — Progress Notes (Signed)
   Subjective:    Patient ID: Troy [name], male    DOB: 11/21/1932, 82 y.o.   MRN: [id number]  HPI  82 year old male pt of Dr. Darnell Level  With history of COPD, DM, CAD presents with new onset ST, body aches and  Fatigue. He reports feeling well until the last week. Started having chills, body ache, weakness all over. Not getting better.  Has cough, productive, white.Increase in shortness of breath, mild. Chest tight, occwheeze.  no chest pain.   No fever. Saw [facility] Client on 06/19/2016:   Flu test was negative.  Treated with Z-pack.. Has not helped at all. " Like eating candy"  Lab Results  Component Value Date   HGBA1C 6.6 (H) 08/25/2015      Hx of smoker, wife in hospital recently with pneumonia   Review of Systems  Constitutional: Positive for chills and fatigue. Negative for fever.  HENT: Negative for ear pain.   Eyes: Negative for pain and redness.  Respiratory: Positive for cough and shortness of breath.   Cardiovascular: Negative for chest pain.       Objective:   Physical Exam  Constitutional: Vital signs are normal. He appears well-developed and well-nourished.  Non-toxic appearance. He does not appear ill. No distress.  HENT:  Head: Normocephalic and atraumatic.  Right Ear: Hearing, tympanic membrane, external ear and ear canal normal. No tenderness. No foreign bodies. Tympanic membrane is not retracted and not bulging.  Left Ear: Hearing, tympanic membrane, external ear and ear canal normal. No tenderness. No foreign bodies. Tympanic membrane is not retracted and not bulging.  Nose: Nose normal. No mucosal edema or rhinorrhea. Right sinus exhibits no maxillary sinus tenderness and no frontal sinus tenderness. Left sinus exhibits no maxillary sinus tenderness and no frontal sinus tenderness.  Mouth/Throat: Uvula is midline, oropharynx is clear and moist and mucous membranes are normal. Normal dentition. No dental caries. No oropharyngeal exudate or tonsillar  abscesses.  Eyes: Conjunctivae, EOM and lids are normal. Pupils are equal, round, and reactive to light. Lids are everted and swept, no foreign bodies found.  Neck: Trachea normal, normal range of motion and phonation normal. Neck supple. Carotid bruit is not present. No thyroid mass and no thyromegaly present.  Cardiovascular: Normal rate, regular rhythm, S1 normal, S2 normal, normal heart sounds, intact distal pulses and normal pulses.  Exam reveals no gallop.   No murmur heard. Pulmonary/Chest: Effort normal. No respiratory distress. He has wheezes. He has rhonchi in the left middle field and the left lower field. He has no rales.  Abdominal: Soft. Normal appearance and bowel sounds are normal. There is no hepatosplenomegaly. There is no tenderness. There is no rebound, no guarding and no CVA tenderness. No hernia.  Neurological: He is alert. He has normal reflexes.  Skin: Skin is warm, dry and intact. No rash noted.  Psychiatric: He has a normal mood and affect. His speech is normal and behavior is normal. Judgment normal.          Assessment & Plan:

## 2016-06-21 NOTE — Telephone Encounter (Signed)
Pt scheduled appt 06/21/16 at 2:30 with Dr Diona Browner. Dr Darnell Level did not have available appt.

## 2016-06-21 NOTE — Progress Notes (Signed)
Pre visit review using our clinic review tool, if applicable. No additional management support is needed unless otherwise documented below in the visit note. 

## 2016-06-21 NOTE — Assessment & Plan Note (Signed)
CXR clear, symptoms worsening despite azithromycin.  Will broaden to doxy BID. Start low dose pred taper x 6 days. Given albuterol prn. Close follow up with PCP next week.

## 2016-06-21 NOTE — Patient Instructions (Addendum)
Can use albuterol inhaler as needed for wheeze. Stop azithromycin and change to  Doxycycline 100 mg BID. Complete course of prednisone.  If worsening shortness of breath go to ER.  Follow up with PCP to assure improvement early next week.

## 2016-07-05 ENCOUNTER — Ambulatory Visit: Payer: Medicare Other | Admitting: Family Medicine

## 2016-07-15 ENCOUNTER — Ambulatory Visit (INDEPENDENT_AMBULATORY_CARE_PROVIDER_SITE_OTHER): Payer: Medicare Other | Admitting: Family Medicine

## 2016-07-15 ENCOUNTER — Encounter: Payer: Self-pay | Admitting: Family Medicine

## 2016-07-15 VITALS — BP 128/60 | HR 72 | Temp 97.9°F | Wt 167.0 lb

## 2016-07-15 DIAGNOSIS — R21 Rash and other nonspecific skin eruption: Secondary | ICD-10-CM | POA: Diagnosis not present

## 2016-07-15 MED ORDER — TRIAMCINOLONE ACETONIDE 0.1 % EX CREA: 1.0000 "application " | TOPICAL_CREAM | Freq: Two times a day (BID) | CUTANEOUS | 0 refills | Status: DC

## 2016-07-15 MED ORDER — PREDNISONE 20 MG PO TABS
ORAL_TABLET | ORAL | 0 refills | Status: DC
Start: 2016-07-15 — End: 2016-10-01

## 2016-07-15 NOTE — Patient Instructions (Signed)
I think body rash may be doxycycline reaction - we will need to watch future reactions on this medicine. I think leg rash is dry skin dermatitis.  Take prednisone course. Start topical steroid to red spots on legs.  Use moisturizing cream (aveeno or eucerin) twice daily throughout body especially legs.  Let us know if not improving with this. Don't shower with too hot water - try lukewarm instead.

## 2016-07-15 NOTE — Assessment & Plan Note (Signed)
Anticipate 2 separate issues: 1. Xerosis dermatitis of legs - treat with regular moisturizing and TCI cream 2. Diffuse pruritic body rash possible drug rash to doxycycline - treat with prednisone oral course + regular moisturizing cream.  Discussed avoiding too hot showers, importance of regular moisturizing of skin. Update if not improved with treatment.

## 2016-07-15 NOTE — Progress Notes (Signed)
BP 128/60   Pulse 72   Temp 97.9 F (36.6 C) (Oral)   Wt 167 lb (75.8 kg)   BMI 24.31 kg/m    CC: check leg rash Subjective:    Patient ID: Troy [name], male    DOB: 1932/11/09, 82 y.o.   MRN: [id number]  HPI: Troy [name] is a 82 y.o. male presenting on 07/15/2016 for Check legs (red places on lower legs x 2-3 weeks)   Noticed itchy red rash on legs over the last few weeks. Now over last 5-6 days noticing red rash on trunk  Denies new medicines, foods, new lotions, detergents, soaps or shampoos.  No new joint pains, fevers/chills, nausea, oral lesions.   Recently treated for COPD exac with doxy and prednisone course.   Relevant past medical, surgical, family and social history reviewed and updated as indicated. Interim medical history since our last visit reviewed. Allergies and medications reviewed and updated. Current Outpatient Prescriptions on File Prior to Visit  Medication Sig  . albuterol (PROVENTIL HFA;VENTOLIN HFA) 108 (90 Base) MCG/ACT inhaler Inhale 2 puffs into the lungs every 6 (six) hours as needed for wheezing or shortness of breath.  [name] aspirin 81 MG tablet Take 81 mg by mouth daily.   . isosorbide mononitrate (IMDUR) 30 MG 24 hr tablet Take one-half tablet by  mouth daily  . magnesium hydroxide (MILK OF MAGNESIA) 400 MG/5ML suspension Take 15 mLs by mouth daily as needed for moderate constipation.  . metFORMIN (GLUCOPHAGE) 500 MG tablet Take 1 tablet by mouth two  times daily with meals  . metoprolol tartrate (LOPRESSOR) 25 MG tablet Take one-half tablet by  mouth twice a day  . nitroGLYCERIN (NITROSTAT) 0.4 MG SL tablet Place 1 tablet (0.4 mg total) under the tongue every 5 (five) minutes as needed. For chest pain-may repeat x3  . omeprazole (PRILOSEC) 40 MG capsule Take 1 capsule by mouth  daily  . simvastatin (ZOCOR) 40 MG tablet Take 1 tablet by mouth at  bedtime  . polyethylene glycol powder (GLYCOLAX/MIRALAX) powder Take 8.5 g by mouth daily.  (Patient not taking: Reported on 07/15/2016)   No current facility-administered medications on file prior to visit.     Review of Systems Per HPI unless specifically indicated in ROS section     Objective:    BP 128/60   Pulse 72   Temp 97.9 F (36.6 C) (Oral)   Wt 167 lb (75.8 kg)   BMI 24.31 kg/m   Wt Readings from Last 3 Encounters:  07/15/16 167 lb (75.8 kg)  06/21/16 165 lb 4 oz (75 kg)  06/19/16 165 lb (74.8 kg)    Physical Exam  Constitutional: He appears well-developed and well-nourished. No distress.  Skin: Skin is warm and dry. Rash noted. There is erythema.  Dry skin throughout Erythematous patches on lower legs Diffuse erythematous maculopapular rash throughout trunk - chest and back, spares upper extremities and face Pruritis throughout  Nursing note and vitals reviewed.     Assessment & Plan:   Problem List Items Addressed This Visit    Skin rash - Primary    Anticipate 2 separate issues: 1. Xerosis dermatitis of legs - treat with regular moisturizing and TCI cream 2. Diffuse pruritic body rash possible drug rash to doxycycline - treat with prednisone oral course + regular moisturizing cream.  Discussed avoiding too hot showers, importance of regular moisturizing of skin. Update if not improved with treatment.        Other  Visit Diagnoses   None.      Follow up plan: Return if symptoms worsen or fail to improve.  [name], MD

## 2016-07-15 NOTE — Progress Notes (Signed)
Pre visit review using our clinic review tool, if applicable. No additional management support is needed unless otherwise documented below in the visit note. 

## 2016-08-31 ENCOUNTER — Other Ambulatory Visit: Payer: Self-pay | Admitting: Family Medicine

## 2016-09-29 ENCOUNTER — Other Ambulatory Visit: Payer: Self-pay | Admitting: Family Medicine

## 2016-09-29 DIAGNOSIS — E7800 Pure hypercholesterolemia, unspecified: Secondary | ICD-10-CM

## 2016-09-29 DIAGNOSIS — E118 Type 2 diabetes mellitus with unspecified complications: Secondary | ICD-10-CM

## 2016-09-29 DIAGNOSIS — D696 Thrombocytopenia, unspecified: Secondary | ICD-10-CM

## 2016-10-01 ENCOUNTER — Ambulatory Visit (INDEPENDENT_AMBULATORY_CARE_PROVIDER_SITE_OTHER): Payer: Medicare Other

## 2016-10-01 VITALS — BP 112/70 | HR 84 | Temp 97.7°F | Ht 70.0 in | Wt 167.8 lb

## 2016-10-01 DIAGNOSIS — Z0000 Encounter for general adult medical examination without abnormal findings: Secondary | ICD-10-CM

## 2016-10-01 DIAGNOSIS — E118 Type 2 diabetes mellitus with unspecified complications: Secondary | ICD-10-CM | POA: Diagnosis not present

## 2016-10-01 DIAGNOSIS — E7800 Pure hypercholesterolemia, unspecified: Secondary | ICD-10-CM | POA: Diagnosis not present

## 2016-10-01 DIAGNOSIS — D696 Thrombocytopenia, unspecified: Secondary | ICD-10-CM

## 2016-10-01 LAB — COMPREHENSIVE METABOLIC PANEL
ALBUMIN: 4.2 g/dL (ref 3.5–5.2)
ALT: 16 U/L (ref 0–53)
AST: 16 U/L (ref 0–37)
Alkaline Phosphatase: 109 U/L (ref 39–117)
BUN: 20 mg/dL (ref 6–23)
CALCIUM: 9.6 mg/dL (ref 8.4–10.5)
CHLORIDE: 104 meq/L (ref 96–112)
CO2: 27 meq/L (ref 19–32)
CREATININE: 1.19 mg/dL (ref 0.40–1.50)
GFR: 62.33 mL/min (ref >60.00)
Glucose, Bld: 100 mg/dL — ABNORMAL HIGH (ref 70–99)
POTASSIUM: 4.8 meq/L (ref 3.5–5.1)
Sodium: 138 mEq/L (ref 135–145)
Total Bilirubin: 0.4 mg/dL (ref 0.2–1.2)
Total Protein: 7.2 g/dL (ref 6.0–8.3)

## 2016-10-01 LAB — CBC WITH DIFFERENTIAL/PLATELET
BASOS PCT: 0.4 % (ref 0.0–3.0)
Basophils Absolute: 0 10*3/uL (ref 0.0–0.1)
EOS ABS: 0.1 10*3/uL (ref 0.0–0.7)
EOS PCT: 1.6 % (ref 0.0–5.0)
HEMATOCRIT: 35.6 % — AB (ref 39.0–52.0)
HEMOGLOBIN: 11.8 g/dL — AB (ref 13.0–17.0)
LYMPHS PCT: 35.2 % (ref 12.0–46.0)
Lymphs Abs: 2.2 10*3/uL (ref 0.7–4.0)
MCHC: 33.2 g/dL (ref 30.0–36.0)
MCV: 85 fl (ref 78.0–100.0)
MONO ABS: 0.5 10*3/uL (ref 0.1–1.0)
Monocytes Relative: 8.2 % (ref 3.0–12.0)
Neutro Abs: 3.5 10*3/uL (ref 1.4–7.7)
Neutrophils Relative %: 54.6 % (ref 43.0–77.0)
Platelets: 153 10*3/uL (ref 150.0–400.0)
RBC: 4.19 Mil/uL — ABNORMAL LOW (ref 4.22–5.81)
RDW: 15.7 % — ABNORMAL HIGH (ref 11.5–15.5)
WBC: 6.4 10*3/uL (ref 4.0–10.5)

## 2016-10-01 LAB — LIPID PANEL
CHOL/HDL RATIO: 4
CHOLESTEROL: 136 mg/dL (ref 0–200)
HDL: 34.2 mg/dL — AB (ref >39.00)
LDL CALC: 81 mg/dL (ref 0–99)
NonHDL: 101.69
TRIGLYCERIDES: 104 mg/dL (ref 0.0–149.0)
VLDL: 20.8 mg/dL (ref 0.0–40.0)

## 2016-10-01 LAB — MICROALBUMIN / CREATININE URINE RATIO
Creatinine,U: 66.8 mg/dL
MICROALB UR: 3.1 mg/dL — AB (ref 0.0–1.9)
Microalb Creat Ratio: 4.6 mg/g (ref 0.0–30.0)

## 2016-10-01 LAB — TSH: TSH: 3.03 u[IU]/mL (ref 0.35–4.50)

## 2016-10-01 LAB — HEMOGLOBIN A1C: HEMOGLOBIN A1C: 6.7 % — AB (ref 4.6–6.5)

## 2016-10-01 NOTE — Progress Notes (Signed)
PCP notes:   Health maintenance:  Pt has declined all vaccines at this time. A1C - completed Urine microalbumin - completed Eye exam  - pt declined Foot exam - PCP will complete at CPE  Abnormal screenings:   Hearing - failed Mini-Score: 18/20  Patient concerns:   None  Nurse concerns:  None  Next PCP appt:   10/08/16 @ 1030

## 2016-10-01 NOTE — Patient Instructions (Signed)
[name] , Thank you for taking time to come for your Medicare Wellness Visit. I appreciate your ongoing commitment to your health goals. Please review the following plan we discussed and let me know if I can assist you in the future.    This is a list of the screening recommended for you and due dates:  Health Maintenance  Topic Date Due  . Complete foot exam   02/12/2017*  . Eye exam for diabetics  10/01/2017*  . DTaP/Tdap/Td vaccine (1 - Tdap) 12/14/2025*  . Flu Shot  12/14/2025*  . Shingles Vaccine  12/14/2025*  . Pneumonia vaccines (2 of 2 - PCV13) 12/14/2025*  . Hemoglobin A1C  03/31/2017  . Urine Protein Check  10/01/2017  . Colon Cancer Screening  11/10/2019  . Tetanus Vaccine  08/14/2020  *Topic was postponed. The date shown is not the original due date.   Preventive Care for Adults  A healthy lifestyle and preventive care can promote health and wellness. Preventive health guidelines for adults include the following key practices.  . A routine yearly physical is a good way to check with your health care provider about your health and preventive screening. It is a chance to share any concerns and updates on your health and to receive a thorough exam.  . Visit your dentist for a routine exam and preventive care every 6 months. Brush your teeth twice a day and floss once a day. Good oral hygiene prevents tooth decay and gum disease.  . The frequency of eye exams is based on your age, health, family medical history, use  of contact lenses, and other factors. Follow your health care provider's ecommendations for frequency of eye exams.  . Eat a healthy diet. Foods like vegetables, fruits, whole grains, low-fat dairy products, and lean protein foods contain the nutrients you need without too many calories. Decrease your intake of foods high in solid fats, added sugars, and salt. Eat the right amount of calories for you. Get information about a proper diet from your health care  provider, if necessary.  . Regular physical exercise is one of the most important things you can do for your health. Most adults should get at least 150 minutes of moderate-intensity exercise (any activity that increases your heart rate and causes you to sweat) each week. In addition, most adults need muscle-strengthening exercises on 2 or more days a week.  Silver Sneakers may be a benefit available to you. To determine eligibility, you may visit the website: www.silversneakers.com or contact program at [phone]) 825-7124 Mon-Fri between 8AM-8PM.   . Maintain a healthy weight. The body mass index (BMI) is a screening tool to identify possible weight problems. It provides an estimate of body fat based on height and weight. Your health care provider can find your BMI and can help you achieve or maintain a healthy weight.   For adults 20 years and older: ? A BMI below 18.5 is considered underweight. ? A BMI of 18.5 to 24.9 is normal. ? A BMI of 25 to 29.9 is considered overweight. ? A BMI of 30 and above is considered obese.   . Maintain normal blood lipids and cholesterol levels by exercising and minimizing your intake of saturated fat. Eat a balanced diet with plenty of fruit and vegetables. Blood tests for lipids and cholesterol should begin at age 90 and be repeated every 5 years. If your lipid or cholesterol levels are high, you are over 50, or you are at high risk for heart  disease, you may need your cholesterol levels checked more frequently. Ongoing high lipid and cholesterol levels should be treated with medicines if diet and exercise are not working.  . If you smoke, find out from your health care provider how to quit. If you do not use tobacco, please do not start.  . If you choose to drink alcohol, please do not consume more than 2 drinks per day. One drink is considered to be 12 ounces (355 mL) of beer, 5 ounces (148 mL) of wine, or 1.5 ounces (44 mL) of liquor.  . If you are 33-79 years  old, ask your health care provider if you should take aspirin to prevent strokes.  . Use sunscreen. Apply sunscreen liberally and repeatedly throughout the day. You should seek shade when your shadow is shorter than you. Protect yourself by wearing long sleeves, pants, a wide-brimmed hat, and sunglasses year round, whenever you are outdoors.  . Once a month, do a whole body skin exam, using a mirror to look at the skin on your back. Tell your health care provider of new moles, moles that have irregular borders, moles that are larger than a pencil eraser, or moles that have changed in shape or color.

## 2016-10-01 NOTE — Progress Notes (Signed)
Pre visit review using our clinic review tool, if applicable. No additional management support is needed unless otherwise documented below in the visit note. 

## 2016-10-01 NOTE — Progress Notes (Signed)
Subjective:   Troy [name] is a 83 y.o. male who presents for Medicare Annual/Subsequent preventive examination.  Review of Systems:  N/A Cardiac Risk Factors include: advanced age (>87men, >52 women);male gender;diabetes mellitus;dyslipidemia;sedentary lifestyle;smoking/ tobacco exposure     Objective:    Vitals: BP 112/70 (BP Location: Left Arm, Patient Position: Sitting, Cuff Size: Normal)   Pulse 84   Temp 97.7 F (36.5 C) (Oral)   Ht 5\' 10"  (1.778 m) Comment: shoes  Wt 167 lb 12 oz (76.1 kg)   SpO2 98%   BMI 24.07 kg/m   Body mass index is 24.07 kg/m.  Tobacco History  Smoking Status  . Current Every Day Smoker  . Packs/day: 0.50  . Years: 60.00  . Types: Cigarettes  . Start date: 09/16/1952  Smokeless Tobacco  . Current User  . Types: Snuff    Comment: < 1 ppd; does dip sometimes     Ready to quit: No Counseling given: No   Past Medical History:  Diagnosis Date  . Aorto-iliac atherosclerosis ([place]) 08/2015   by xray  . Barrett's esophagus    EGD 2012, rpt 2014, longterm PPI  . Chronic idiopathic constipation 07/17/2010  . Chronic obstructive pulmonary disease (COPD) ([facility])    spirometry with moderate COPD  . Coronary atherosclerosis of artery bypass graft   . Diabetes mellitus type II ~2008  . Gastrointestinal ulcer 04/2011   with hematemesis s/p EGD by [facility]  . GERD (gastroesophageal reflux disease)   . HLD (hyperlipidemia)   . HNP (herniated nucleus pulposus), lumbar 2013   with compression of L5 nerve root and foot drop, also with lumbar DDD s/p surgery  . Hypertrophy of prostate without urinary obstruction and other lower urinary tract symptoms (LUTS)   . Paroxysmal SVT (supraventricular tachycardia) ([place])    a. 3/14 => converted in ED with adenosine to AFib => NSR;  b. Echo 4/14:  Mild LVH, mild FBSH, EF 55-60%, Gr 1 DD  . Prostate nodule ~2009   Left-benign s/p eval Uro WNL ([place])  . Thrombocytopenia ([place])   . Tobacco use disorder     Past Surgical History:  Procedure Laterality Date  . COLONOSCOPY  03/2009   small hemorrhoids, 2 polyps (hyperplastic and adenomatous), rpt 5 yrs (Dr. Barron Schmid) - pt declined rpt scheduling  . COLONOSCOPY  10/2014   2 hyperplastic polyp, hemmorhoids ([place])  . CORONARY ARTERY BYPASS GRAFT  1999   Dr. Roxy Manns  . ESOPHAGOGASTRODUODENOSCOPY  05/16/2011   after hematemesis, no lesions found, consistent with barrett's esophagus rpt 2 yrs  . ESOPHAGOGASTRODUODENOSCOPY  06/2013   barrett's, small HH, recheck 2-3 yrs ([place])  . HEMORRHOID SURGERY    . LUMBAR LAMINECTOMY/DECOMPRESSION MICRODISCECTOMY  01/31/2012   Procedure: LUMBAR LAMINECTOMY/DECOMPRESSION MICRODISCECTOMY 1 LEVEL;  Surgeon: Winfield Cunas, MD;  Location: [facility];  Service: Neurosurgery;  Laterality: Left;  LEFT Lumbar Four-Five Diskectomy  . NOSE SURGERY    . ROTATOR CUFF REPAIR  04/2010   right (but has bilat) Dr. Latanya Maudlin ([facility])   Family History  Problem Relation Age of Onset  . Cancer Father 16    colon   History  Sexual Activity  . Sexual activity: Not on file    Outpatient Encounter Prescriptions as of 10/01/2016  Medication Sig  . albuterol (PROVENTIL HFA;VENTOLIN HFA) 108 (90 Base) MCG/ACT inhaler Inhale 2 puffs into the lungs every 6 (six) hours as needed for wheezing or shortness of breath.  [name] aspirin 81 MG tablet Take 81  mg by mouth daily.   . isosorbide mononitrate (IMDUR) 30 MG 24 hr tablet TAKE ONE-HALF TABLET BY  MOUTH DAILY  . magnesium hydroxide (MILK OF MAGNESIA) 400 MG/5ML suspension Take 15 mLs by mouth daily as needed for moderate constipation.  . metFORMIN (GLUCOPHAGE) 500 MG tablet TAKE 1 TABLET BY MOUTH TWO  TIMES DAILY WITH MEALS  . metoprolol tartrate (LOPRESSOR) 25 MG tablet TAKE ONE-HALF TABLET BY  MOUTH TWICE A DAY  . nitroGLYCERIN (NITROSTAT) 0.4 MG SL tablet Place 1 tablet (0.4 mg total) under the tongue every 5 (five) minutes as needed. For chest pain-may repeat x3  .  omeprazole (PRILOSEC) 40 MG capsule TAKE 1 CAPSULE BY MOUTH  DAILY  . polyethylene glycol powder (GLYCOLAX/MIRALAX) powder Take 8.5 g by mouth daily.  . simvastatin (ZOCOR) 40 MG tablet TAKE 1 TABLET BY MOUTH AT  BEDTIME  . triamcinolone cream (KENALOG) 0.1 % Apply 1 application topically 2 (two) times daily. Apply to AA.  . [DISCONTINUED] predniSONE (DELTASONE) 20 MG tablet Take two tablets daily for 3 days followed by one tablet daily for 4 days   No facility-administered encounter medications on file as of 10/01/2016.     Activities of Daily Living In your present state of health, do you have any difficulty performing the following activities: 10/01/2016 10/01/2016  Hearing? N N  Vision? - N  Difficulty concentrating or making decisions? - N  Walking or climbing stairs? - N  Dressing or bathing? - N  Doing errands, shopping? - N  Conservation officer, nature and eating ? - N  Using the Toilet? - N  In the past six months, have you accidently leaked urine? - N  Do you have problems with loss of bowel control? - N  Managing your Medications? - N  Managing your Finances? - N  Some recent data might be hidden    Patient Care Team: [name], MD as PCP - General   Assessment:     Hearing Screening   125Hz  250Hz  500Hz  1000Hz  2000Hz  3000Hz  4000Hz  6000Hz  8000Hz   Right ear:   0 0 0  0    Left ear:   0 0 0  0      Exercise Activities and Dietary recommendations Current Exercise Habits: The patient does not participate in regular exercise at present, Exercise limited by: None identified  Goals    . other (pt-stated)          Pt declined setting a goal at this time.       Fall Risk Fall Risk  10/01/2016  Falls in the past year? No   Depression Screen PHQ 2/9 Scores 10/01/2016 04/29/2012 10/31/2011  PHQ - 2 Score 0 0 0    Cognitive Function MMSE - Mini Mental State Exam 10/01/2016  Orientation to time 5  Orientation to Place 5  Registration 3  Attention/ Calculation 0  Recall 1   Recall-comments pt was unable to recall 2 of 3 words  Language- name 2 objects 0  Language- repeat 1  Language- follow 3 step command 3  Language- read & follow direction 0  Write a sentence 0  Copy design 0  Total score 18     PLEASE NOTE: A Mini-Cog screen was completed. Maximum score is 20. A value of 0 denotes this part of [place] MMSE was not completed or the patient failed this part of the Mini-Cog screening.   Mini-Cog Screening Orientation to Time - Max 5 pts Orientation to Place - Max 5  pts Registration - Max 3 pts Recall - Max 3 pts Language Repeat - Max 1 pts Language Follow 3 Step Command - Max 3 pts     Immunization History  Administered Date(s) Administered  . Influenza Whole 06/20/2010  . Influenza, High Dose Seasonal PF 06/02/2015  . Influenza, Seasonal, Injecte, Preservative Fre 06/16/2014  . Pneumococcal Polysaccharide-23 09/17/2011  . Td 07/01/1999, 08/14/2010   Screening Tests Health Maintenance  Topic Date Due  . FOOT EXAM  02/12/2017 (Originally 02/14/2016)  . OPHTHALMOLOGY EXAM  10/01/2017 (Originally 06/16/2012)  . DTaP/Tdap/Td (1 - Tdap) 12/14/2025 (Originally 08/15/2010)  . INFLUENZA VACCINE  12/14/2025 (Originally 04/16/2016)  . ZOSTAVAX  12/14/2025 (Originally 07/11/1995)  . PNA vac Low Risk Adult (2 of 2 - PCV13) 12/14/2025 (Originally 09/16/2012)  . HEMOGLOBIN A1C  03/31/2017  . URINE MICROALBUMIN  10/01/2017  . COLONOSCOPY  11/10/2019  . TETANUS/TDAP  08/14/2020      Plan:     I have personally reviewed and addressed the Medicare Annual Wellness questionnaire and have noted the following in the patient's chart:  A. Medical and social history B. Use of alcohol, tobacco or illicit drugs  C. Current medications and supplements D. Functional ability and status E.  Nutritional status F.  Physical activity G. Advance directives H. List of other physicians I.  Hospitalizations, surgeries, and ER visits in previous 12 months J.   [place] to include hearing, vision, cognitive, depression L. Referrals and appointments - none  In addition, I have reviewed and discussed with patient certain preventive protocols, quality metrics, and best practice recommendations. A written personalized care plan for preventive services as well as general preventive health recommendations were provided to patient.  See attached scanned questionnaire for additional information.   Signed,   Lindell Noe, MHA, BS, LPN Health Coach

## 2016-10-03 NOTE — Progress Notes (Signed)
I reviewed health advisor's note, was available for consultation, and agree with documentation and plan.  

## 2016-10-08 ENCOUNTER — Ambulatory Visit (INDEPENDENT_AMBULATORY_CARE_PROVIDER_SITE_OTHER): Payer: Medicare Other | Admitting: Family Medicine

## 2016-10-08 ENCOUNTER — Encounter: Payer: Self-pay | Admitting: Family Medicine

## 2016-10-08 VITALS — BP 128/68 | HR 64 | Temp 97.8°F | Wt 167.8 lb

## 2016-10-08 DIAGNOSIS — F17200 Nicotine dependence, unspecified, uncomplicated: Secondary | ICD-10-CM

## 2016-10-08 DIAGNOSIS — I700 Atherosclerosis of aorta: Secondary | ICD-10-CM

## 2016-10-08 DIAGNOSIS — R413 Other amnesia: Secondary | ICD-10-CM

## 2016-10-08 DIAGNOSIS — D649 Anemia, unspecified: Secondary | ICD-10-CM | POA: Insufficient documentation

## 2016-10-08 DIAGNOSIS — Z7189 Other specified counseling: Secondary | ICD-10-CM | POA: Diagnosis not present

## 2016-10-08 DIAGNOSIS — E010 Iodine-deficiency related diffuse (endemic) goiter: Secondary | ICD-10-CM

## 2016-10-08 DIAGNOSIS — E7800 Pure hypercholesterolemia, unspecified: Secondary | ICD-10-CM

## 2016-10-08 DIAGNOSIS — I708 Atherosclerosis of other arteries: Secondary | ICD-10-CM

## 2016-10-08 DIAGNOSIS — Z0000 Encounter for general adult medical examination without abnormal findings: Secondary | ICD-10-CM | POA: Insufficient documentation

## 2016-10-08 DIAGNOSIS — E118 Type 2 diabetes mellitus with unspecified complications: Secondary | ICD-10-CM

## 2016-10-08 DIAGNOSIS — I70299 Other atherosclerosis of native arteries of extremities, unspecified extremity: Secondary | ICD-10-CM

## 2016-10-08 DIAGNOSIS — J449 Chronic obstructive pulmonary disease, unspecified: Secondary | ICD-10-CM

## 2016-10-08 DIAGNOSIS — I2510 Atherosclerotic heart disease of native coronary artery without angina pectoris: Secondary | ICD-10-CM

## 2016-10-08 MED ORDER — SIMVASTATIN 40 MG PO TABS: 40.0000 mg | ORAL_TABLET | Freq: Every day | ORAL | 3 refills | Status: DC

## 2016-10-08 MED ORDER — METOPROLOL TARTRATE 25 MG PO TABS: 12.5000 mg | ORAL_TABLET | Freq: Two times a day (BID) | ORAL | 3 refills | Status: DC

## 2016-10-08 MED ORDER — OMEPRAZOLE 40 MG PO CPDR: 40.0000 mg | DELAYED_RELEASE_CAPSULE | Freq: Every day | ORAL | 3 refills | Status: DC

## 2016-10-08 MED ORDER — ISOSORBIDE MONONITRATE ER 30 MG PO TB24: 15.0000 mg | ORAL_TABLET | Freq: Every day | ORAL | 3 refills | Status: DC

## 2016-10-08 MED ORDER — METFORMIN HCL 500 MG PO TABS: ORAL_TABLET | ORAL | 3 refills | Status: DC

## 2016-10-08 NOTE — Patient Instructions (Addendum)
Check with drug store about what pneumonia shot you got (our records show you're due for final shot - prevnar).  Keep working on quitting tobacco.  Schedule eye exam if you're due (yearly).  Slight anemia - we will recheck in 6 months.  Return in 6 months for follow up visit - recheck anemia and neck.   Health Maintenance, Male A healthy lifestyle and preventative care can promote health and wellness.  Maintain regular health, dental, and eye exams.  Eat a healthy diet. Foods like vegetables, fruits, whole grains, low-fat dairy products, and lean protein foods contain the nutrients you need and are low in calories. Decrease your intake of foods high in solid fats, added sugars, and salt. Get information about a proper diet from your health care provider, if necessary.  Regular physical exercise is one of the most important things you can do for your health. Most adults should get at least 150 minutes of moderate-intensity exercise (any activity that increases your heart rate and causes you to sweat) each week. In addition, most adults need muscle-strengthening exercises on 2 or more days a week.   Maintain a healthy weight. The body mass index (BMI) is a screening tool to identify possible weight problems. It provides an estimate of body fat based on height and weight. Your health care provider can find your BMI and can help you achieve or maintain a healthy weight. For males 20 years and older:  A BMI below 18.5 is considered underweight.  A BMI of 18.5 to 24.9 is normal.  A BMI of 25 to 29.9 is considered overweight.  A BMI of 30 and above is considered obese.  Maintain normal blood lipids and cholesterol by exercising and minimizing your intake of saturated fat. Eat a balanced diet with plenty of fruits and vegetables. Blood tests for lipids and cholesterol should begin at age 40 and be repeated every 5 years. If your lipid or cholesterol levels are high, you are over age 5, or you are  at high risk for heart disease, you may need your cholesterol levels checked more frequently.Ongoing high lipid and cholesterol levels should be treated with medicines if diet and exercise are not working.  If you smoke, find out from your health care provider how to quit. If you do not use tobacco, do not start.  Lung cancer screening is recommended for adults aged 87-80 years who are at high risk for developing lung cancer because of a history of smoking. A yearly low-dose CT scan of the lungs is recommended for people who have at least a 30-pack-year history of smoking and are current smokers or have quit within the past 15 years. A pack year of smoking is smoking an average of 1 pack of cigarettes a day for 1 year (for example, a 30-pack-year history of smoking could mean smoking 1 pack a day for 30 years or 2 packs a day for 15 years). Yearly screening should continue until the smoker has stopped smoking for at least 15 years. Yearly screening should be stopped for people who develop a health problem that would prevent them from having lung cancer treatment.  If you choose to drink alcohol, do not have more than 2 drinks per day. One drink is considered to be 12 oz (360 mL) of beer, 5 oz (150 mL) of wine, or 1.5 oz (45 mL) of liquor.  Avoid the use of street drugs. Do not share needles with anyone. Ask for help if you need support  or instructions about stopping the use of drugs.  High blood pressure causes heart disease and increases the risk of stroke. High blood pressure is more likely to develop in:  People who have blood pressure in the end of the normal range (100-139/85-89 mm Hg).  People who are overweight or obese.  People who are African American.  If you are 33-31 years of age, have your blood pressure checked every 3-5 years. If you are 25 years of age or older, have your blood pressure checked every year. You should have your blood pressure measured twice-once when you are at a  hospital or clinic, and once when you are not at a hospital or clinic. Record the average of the two measurements. To check your blood pressure when you are not at a hospital or clinic, you can use:  An automated blood pressure machine at a pharmacy.  A home blood pressure monitor.  If you are 44-63 years old, ask your health care provider if you should take aspirin to prevent heart disease.  Diabetes screening involves taking a blood sample to check your fasting blood sugar level. This should be done once every 3 years after age 2 if you are at a normal weight and without risk factors for diabetes. Testing should be considered at a younger age or be carried out more frequently if you are overweight and have at least 1 risk factor for diabetes.  Colorectal cancer can be detected and often prevented. Most routine colorectal cancer screening begins at the age of 3 and continues through age 48. However, your health care provider may recommend screening at an earlier age if you have risk factors for colon cancer. On a yearly basis, your health care provider may provide home test kits to check for hidden blood in the stool. A small camera at the end of a tube may be used to directly examine the colon (sigmoidoscopy or colonoscopy) to detect the earliest forms of colorectal cancer. Talk to your health care provider about this at age 43 when routine screening begins. A direct exam of the colon should be repeated every 5-10 years through age 70, unless early forms of precancerous polyps or small growths are found.  People who are at an increased risk for hepatitis B should be screened for this virus. You are considered at high risk for hepatitis B if:  You were born in a country where hepatitis B occurs often. Talk with your health care provider about which countries are considered high risk.  Your parents were born in a high-risk country and you have not received a shot to protect against hepatitis B  (hepatitis B vaccine).  You have HIV or AIDS.  You use needles to inject street drugs.  You live with, or have sex with, someone who has hepatitis B.  You are a man who has sex with other men (MSM).  You get hemodialysis treatment.  You take certain medicines for conditions like cancer, organ transplantation, and autoimmune conditions.  Hepatitis C blood testing is recommended for all people born from [birth year] through 1963 and any individual with known risk factors for hepatitis C.  Healthy men should no longer receive prostate-specific antigen (PSA) blood tests as part of routine cancer screening. Talk to your health care provider about prostate cancer screening.  Testicular cancer screening is not recommended for adolescents or adult males who have no symptoms. Screening includes self-exam, a health care provider exam, and other screening tests. Consult with your health  care provider about any symptoms you have or any concerns you have about testicular cancer.  Practice safe sex. Use condoms and avoid high-risk sexual practices to reduce the spread of sexually transmitted infections (STIs).  You should be screened for STIs, including gonorrhea and chlamydia if:  You are sexually active and are younger than 24 years.  You are older than 24 years, and your health care provider tells you that you are at risk for this type of infection.  Your sexual activity has changed since you were last screened, and you are at an increased risk for chlamydia or gonorrhea. Ask your health care provider if you are at risk.  If you are at risk of being infected with HIV, it is recommended that you take a prescription medicine daily to prevent HIV infection. This is called pre-exposure prophylaxis (PrEP). You are considered at risk if:  You are a man who has sex with other men (MSM).  You are a heterosexual man who is sexually active with multiple partners.  You take drugs by injection.  You are  sexually active with a partner who has HIV.  Talk with your health care provider about whether you are at high risk of being infected with HIV. If you choose to begin PrEP, you should first be tested for HIV. You should then be tested every 3 months for as long as you are taking PrEP.  Use sunscreen. Apply sunscreen liberally and repeatedly throughout the day. You should seek shade when your shadow is shorter than you. Protect yourself by wearing long sleeves, pants, a wide-brimmed hat, and sunglasses year round whenever you are outdoors.  Tell your health care provider of new moles or changes in moles, especially if there is a change in shape or color. Also, tell your health care provider if a mole is larger than the size of a pencil eraser.  A one-time screening for abdominal aortic aneurysm (AAA) and surgical repair of large AAAs by ultrasound is recommended for men aged 36-75 years who are current or former smokers.  Stay current with your vaccines (immunizations). This information is not intended to replace advice given to you by your health care provider. Make sure you discuss any questions you have with your health care provider. Document Released: 02/29/2008 Document Revised: 09/23/2014 Document Reviewed: 06/06/2015 Elsevier Interactive Patient Education  2017 [employer].

## 2016-10-08 NOTE — Assessment & Plan Note (Signed)
Chronic, stable. Continue current regimen. Foot exam next visit.  Encouraged schedule eye exam. Pt will check with pharmacy about prevnar.

## 2016-10-08 NOTE — Progress Notes (Signed)
Pre visit review using our clinic review tool, if applicable. No additional management support is needed unless otherwise documented below in the visit note. 

## 2016-10-08 NOTE — Assessment & Plan Note (Signed)
Preventative protocols reviewed and updated unless pt declined. Discussed healthy diet and lifestyle.  

## 2016-10-08 NOTE — Assessment & Plan Note (Signed)
Continue to encourage cessation. Has started dipping.

## 2016-10-08 NOTE — Assessment & Plan Note (Signed)
Continue aspirin, statin.  

## 2016-10-08 NOTE — Progress Notes (Addendum)
BP 128/68   Pulse 64   Temp 97.8 F (36.6 C) (Oral)   Wt 167 lb 12 oz (76.1 kg)   BMI 24.07 kg/m    CC: CPE Subjective:    Patient ID: Troy [name], male    DOB: 1932-11-19, 83 y.o.   MRN: [id number]  HPI: Troy [name] is a 83 y.o. male presenting on 10/08/2016 for Annual Exam   Saw Katha Cabal last week for medicare wellness visit. Note reviewed. Failed hearing screen. Due for eye exam. Desires to see PCP for medicare wellness visits.    DM - doesn't check sugars regularly. Due for eye exam.   Preventative: COLONOSCOPY 10/2014; 2 hyperplastic polyp, hemmorhoids ([place]) Prostate cancer screening - aged out Lung cancer screening - not eligible due to age Flu shot - yearly Td 2011 Pneumovax 2013, thinks got prevnar at pharmacy Shingles shot - declines Advanced directive discussion - does not have set up. Would want wife then son and daughter to be HCPOA. Packet provided last week Seat belt use discussed Sunscreen use discussed. No changing moles on skin. Smoking 4-5 cig/day. Started dipping. Sees dentist.  Alcohol - none  Lives with wife; No pets He is very active He was in the [employer] for 4 years. He gets meds from the [place] Activity: walking daily since back surgery Diet: good water, not much fruits/vegetables   Relevant past medical, surgical, family and social history reviewed and updated as indicated. Interim medical history since our last visit reviewed. Allergies and medications reviewed and updated. Current Outpatient Prescriptions on File Prior to Visit  Medication Sig  . albuterol (PROVENTIL HFA;VENTOLIN HFA) 108 (90 Base) MCG/ACT inhaler Inhale 2 puffs into the lungs every 6 (six) hours as needed for wheezing or shortness of breath.  [name] aspirin 81 MG tablet Take 81 mg by mouth daily.   . magnesium hydroxide (MILK OF MAGNESIA) 400 MG/5ML suspension Take 15 mLs by mouth daily as needed for moderate constipation.  . nitroGLYCERIN (NITROSTAT) 0.4 MG SL tablet Place 1  tablet (0.4 mg total) under the tongue every 5 (five) minutes as needed. For chest pain-may repeat x3  . polyethylene glycol powder (GLYCOLAX/MIRALAX) powder Take 8.5 g by mouth daily.  [name] triamcinolone cream (KENALOG) 0.1 % Apply 1 application topically 2 (two) times daily. Apply to AA.   No current facility-administered medications on file prior to visit.     Review of Systems  Constitutional: Negative for activity change, appetite change, chills, fatigue, fever and unexpected weight change.  HENT: Negative for hearing loss.   Eyes: Negative for visual disturbance.  Respiratory: Positive for cough. Negative for chest tightness, shortness of breath and wheezing.   Cardiovascular: Negative for chest pain, palpitations and leg swelling.  Gastrointestinal: Negative for abdominal distention, abdominal pain, blood in stool, constipation, diarrhea, nausea and vomiting.  Genitourinary: Negative for difficulty urinating and hematuria.  Musculoskeletal: Negative for arthralgias, myalgias and neck pain.  Skin: Negative for rash.  Neurological: Negative for dizziness, seizures, syncope and headaches.  Hematological: Negative for adenopathy. Does not bruise/bleed easily.  Psychiatric/Behavioral: Negative for dysphoric mood. The patient is not nervous/anxious.    Per HPI unless specifically indicated in ROS section     Objective:    BP 128/68   Pulse 64   Temp 97.8 F (36.6 C) (Oral)   Wt 167 lb 12 oz (76.1 kg)   BMI 24.07 kg/m   Wt Readings from Last 3 Encounters:  10/08/16 167 lb 12 oz (76.1 kg)  10/01/16 167 lb 12 oz (76.1 kg)  07/15/16 167 lb (75.8 kg)    Physical Exam  Constitutional: He is oriented to person, place, and time. He appears well-developed and well-nourished. No distress.  HENT:  Head: Normocephalic and atraumatic.  Right Ear: Hearing, tympanic membrane, external ear and ear canal normal.  Left Ear: Hearing, tympanic membrane, external ear and ear canal normal.  Nose:  Nose normal.  Mouth/Throat: Uvula is midline, oropharynx is clear and moist and mucous membranes are normal. No oropharyngeal exudate, posterior oropharyngeal edema or posterior oropharyngeal erythema.  Eyes: Conjunctivae and EOM are normal. Pupils are equal, round, and reactive to light. No scleral icterus.  Neck: Normal range of motion. Neck supple. Carotid bruit is not present. Thyromegaly (R sided) present.  Cardiovascular: Normal rate, regular rhythm, normal heart sounds and intact distal pulses.   No murmur heard. Pulses:      Radial pulses are 2+ on the right side, and 2+ on the left side.  Pulmonary/Chest: Effort normal and breath sounds normal. No respiratory distress. He has no wheezes. He has no rales.  Coarse breath sounds  Abdominal: Soft. Bowel sounds are normal. He exhibits no distension and no mass. There is no tenderness. There is no rebound and no guarding.  Musculoskeletal: Normal range of motion. He exhibits no edema.  Lymphadenopathy:    He has no cervical adenopathy.  Neurological: He is alert and oriented to person, place, and time.  CN grossly intact, station and gait intact  Skin: Skin is warm and dry. No rash noted.  Psychiatric: He has a normal mood and affect. His behavior is normal. Judgment and thought content normal.  Nursing note and vitals reviewed.  Results for orders placed or performed in visit on 10/01/16  Lipid panel  Result Value Ref Range   Cholesterol 136 0 - 200 mg/dL   Triglycerides 104.0 0.0 - 149.0 mg/dL   HDL 34.20 (L) >39.00 mg/dL   VLDL 20.8 0.0 - 40.0 mg/dL   LDL Cholesterol 81 0 - 99 mg/dL   Total CHOL/HDL Ratio 4    NonHDL 101.69   Comprehensive metabolic panel  Result Value Ref Range   Sodium 138 135 - 145 mEq/L   Potassium 4.8 3.5 - 5.1 mEq/L   Chloride 104 96 - 112 mEq/L   CO2 27 19 - 32 mEq/L   Glucose, Bld 100 (H) 70 - 99 mg/dL   BUN 20 6 - 23 mg/dL   Creatinine, Ser 1.19 0.40 - 1.50 mg/dL   Total Bilirubin 0.4 0.2 - 1.2  mg/dL   Alkaline Phosphatase 109 39 - 117 U/L   AST 16 0 - 37 U/L   ALT 16 0 - 53 U/L   Total Protein 7.2 6.0 - 8.3 g/dL   Albumin 4.2 3.5 - 5.2 g/dL   Calcium 9.6 8.4 - 10.5 mg/dL   GFR 62.33 >60.00 mL/min  Hemoglobin A1c  Result Value Ref Range   Hgb A1c MFr Bld 6.7 (H) 4.6 - 6.5 %  CBC with Differential/Platelet  Result Value Ref Range   WBC 6.4 4.0 - 10.5 K/uL   RBC 4.19 (L) 4.22 - 5.81 Mil/uL   Hemoglobin 11.8 (L) 13.0 - 17.0 g/dL   HCT 35.6 (L) 39.0 - 52.0 %   MCV 85.0 78.0 - 100.0 fl   MCHC 33.2 30.0 - 36.0 g/dL   RDW 15.7 (H) 11.5 - 15.5 %   Platelets 153.0 150.0 - 400.0 K/uL   Neutrophils Relative % 54.6 43.0 -  77.0 %   Lymphocytes Relative 35.2 12.0 - 46.0 %   Monocytes Relative 8.2 3.0 - 12.0 %   Eosinophils Relative 1.6 0.0 - 5.0 %   Basophils Relative 0.4 0.0 - 3.0 %   Neutro Abs 3.5 1.4 - 7.7 K/uL   Lymphs Abs 2.2 0.7 - 4.0 K/uL   Monocytes Absolute 0.5 0.1 - 1.0 K/uL   Eosinophils Absolute 0.1 0.0 - 0.7 K/uL   Basophils Absolute 0.0 0.0 - 0.1 K/uL  TSH  Result Value Ref Range   TSH 3.03 0.35 - 4.50 uIU/mL  Microalbumin / creatinine urine ratio  Result Value Ref Range   Microalb, Ur 3.1 (H) 0.0 - 1.9 mg/dL   Creatinine,U 66.8 mg/dL   Microalb Creat Ratio 4.6 0.0 - 30.0 mg/g      Assessment & Plan:   Problem List Items Addressed This Visit    Advanced care planning/counseling discussion    Advanced directive discussion - does not have set up. Would want wife then son and daughter to be HCPOA. Packet provided last week      Anemia    Consider periph smear for anemia with mild thrombocytopenia as well as anemia panel.      Aorto-iliac atherosclerosis (HCC)    Continue aspirin, statin.       Relevant Medications   isosorbide mononitrate (IMDUR) 30 MG 24 hr tablet   metoprolol tartrate (LOPRESSOR) 25 MG tablet   simvastatin (ZOCOR) 40 MG tablet   CAD (coronary artery disease)    S/p CABG. Continue aspirin, statin, beta blocker, imdur       Relevant Medications   isosorbide mononitrate (IMDUR) 30 MG 24 hr tablet   metoprolol tartrate (LOPRESSOR) 25 MG tablet   simvastatin (ZOCOR) 40 MG tablet   COPD (chronic obstructive pulmonary disease) (HCC)    Mod severe obstruction by spirometry 2014.  Controlled on PRN albuterol. Prior on symbicort, not currently.       Diabetes mellitus type 2 with complications (HCC)    Chronic, stable. Continue current regimen. Foot exam next visit.  Encouraged schedule eye exam. Pt will check with pharmacy about prevnar.       Relevant Medications   metFORMIN (GLUCOPHAGE) 500 MG tablet   simvastatin (ZOCOR) 40 MG tablet   Health maintenance examination - Primary    Preventative protocols reviewed and updated unless pt declined. Discussed healthy diet and lifestyle.       HYPERCHOLESTEROLEMIA    Chronic, stable. Continue current regimen.       Relevant Medications   isosorbide mononitrate (IMDUR) 30 MG 24 hr tablet   metoprolol tartrate (LOPRESSOR) 25 MG tablet   simvastatin (ZOCOR) 40 MG tablet   Memory deficit    Passed recent memory testing at medicare wellness visit.      Smoker    Continue to encourage cessation. Has started dipping.       Thyromegaly    R sided noted on exam today - pt denies dysphagia, globus sensation, neck pain. No LAD appreciated. Recent TSH WNL . Recommended thyroid US, pt declines. Agrees to return 5 mo recheck.       Relevant Medications   metoprolol tartrate (LOPRESSOR) 25 MG tablet       Follow up plan: Return in about 6 months (around 04/07/2017), or as needed, for follow up visit.  [name], MD

## 2016-10-08 NOTE — Assessment & Plan Note (Signed)
Passed recent memory testing at medicare wellness visit.

## 2016-10-08 NOTE — Assessment & Plan Note (Signed)
R sided noted on exam today - pt denies dysphagia, globus sensation, neck pain. No LAD appreciated. Recent TSH WNL . Recommended thyroid US, pt declines. Agrees to return 5 mo recheck.

## 2016-10-08 NOTE — Assessment & Plan Note (Signed)
Mod severe obstruction by spirometry 2014.  Controlled on PRN albuterol. Prior on symbicort, not currently.

## 2016-10-08 NOTE — Assessment & Plan Note (Signed)
Advanced directive discussion - does not have set up. Would want wife then son and daughter to be HCPOA. Packet provided last week

## 2016-10-08 NOTE — Assessment & Plan Note (Signed)
On daily PPI. Discussed trial QOD dosing.

## 2016-10-08 NOTE — Assessment & Plan Note (Signed)
Chronic, stable. Continue current regimen. 

## 2016-10-08 NOTE — Assessment & Plan Note (Signed)
Consider periph smear for anemia with mild thrombocytopenia as well as anemia panel.

## 2016-10-08 NOTE — Assessment & Plan Note (Signed)
S/p CABG. Continue aspirin, statin, beta blocker, imdur

## 2016-10-15 ENCOUNTER — Telehealth: Payer: Self-pay

## 2016-10-15 MED ORDER — AZITHROMYCIN 250 MG PO TABS: ORAL_TABLET | ORAL | 0 refills | Status: DC

## 2016-10-15 MED ORDER — BENZONATATE 100 MG PO CAPS: 100.0000 mg | ORAL_CAPSULE | Freq: Three times a day (TID) | ORAL | 0 refills | Status: DC | PRN

## 2016-10-15 NOTE — Telephone Encounter (Signed)
Rx's called in as directed and patient notified.

## 2016-10-15 NOTE — Telephone Encounter (Signed)
Seen 7d ago, at that time had cough, known COPD continued smoker.  Ongoing ST, body aches, productive cough.  rec tessalon perls for cough may take during the day as well.  Will treat presumed bronchitis with zpack. If persistent symptoms will need to come in for evaluation.   ERx failure to [facility] x2 - plz phone in Rx.  Accidentally also sent [facility] - plz call and cancel these at [facility].

## 2016-10-15 NOTE — Telephone Encounter (Signed)
Pt was seen 10/08/16 and pt got sicker after he left the office. Pt has very bad S/T,achy,runny nose,prod cough with yellow phlegm. Cough all the time and cannot sleep at night due to cough. ? Fever. Pt does not want to schedule appt; pt request med sent to [facility]. Pt wants cb ASAP.

## 2016-11-20 ENCOUNTER — Telehealth: Payer: Self-pay

## 2016-11-20 NOTE — Telephone Encounter (Signed)
I am very sorry for their loss  Ativan 0.5 mg, 1 po q 8 hours prn nervousness or insomnia, #15, 0 ref

## 2016-11-20 NOTE — Telephone Encounter (Signed)
Don pts son request med for pt to have on hand for nervousness and to help pt sleep if needed. pts wife died suddenly. Pt last seen Oct 17, 2016 for annual. Dr Darnell Level is out of office and [name] request this note sent to Dr Lorelei Pont who is his physician. Don request cb when med sent to [facility].

## 2016-11-20 NOTE — Telephone Encounter (Signed)
Rx called in as directed and requested to [facility]. Message left advising patient's son. Expressed condolences on behalf of Dr. Darnell Level, Dr. Loletha Grayer and myself. I had just spoken with patient's wife yesterday.

## 2016-12-02 ENCOUNTER — Ambulatory Visit (INDEPENDENT_AMBULATORY_CARE_PROVIDER_SITE_OTHER): Payer: Medicare Other | Admitting: Family Medicine

## 2016-12-02 ENCOUNTER — Encounter: Payer: Self-pay | Admitting: Family Medicine

## 2016-12-02 VITALS — BP 144/82 | HR 84 | Temp 97.9°F | Wt 165.5 lb

## 2016-12-02 DIAGNOSIS — S0011XA Contusion of right eyelid and periocular area, initial encounter: Secondary | ICD-10-CM

## 2016-12-02 DIAGNOSIS — S0511XA Contusion of eyeball and orbital tissues, right eye, initial encounter: Secondary | ICD-10-CM | POA: Insufficient documentation

## 2016-12-02 DIAGNOSIS — L853 Xerosis cutis: Secondary | ICD-10-CM | POA: Diagnosis not present

## 2016-12-02 MED ORDER — TRIAMCINOLONE ACETONIDE 0.1 % EX CREA: 1.0000 "application " | TOPICAL_CREAM | Freq: Two times a day (BID) | CUTANEOUS | 0 refills | Status: AC

## 2016-12-02 NOTE — Patient Instructions (Addendum)
You have dry skin dermatitis. Continue lukewarm showers every other day.  Start using moistuirizing cream twice daily every day - aveeno or eucerin brands are good. May continue triamcinolone cream - sent to pharmacy. But mainly use moisturizing cream throughout the day.  Decrease heat setting at home.

## 2016-12-02 NOTE — Progress Notes (Signed)
Pre visit review using our clinic review tool, if applicable. No additional management support is needed unless otherwise documented below in the visit note. 

## 2016-12-02 NOTE — Assessment & Plan Note (Signed)
?  acquired icthyosis. rec start treatment with supportive care of intermittent lukewarm showers (QOD), decrease heater temperature at home, regular moistuirizing cream (Aveeno or eucerin) twice daily, and PRN triamcinolone cream (jar eRx today).  If no better, consider derm referral vs further evaluation. Pt agrees with plan.

## 2016-12-02 NOTE — Assessment & Plan Note (Signed)
Reassured. Continue to monitor.  

## 2016-12-02 NOTE — Progress Notes (Signed)
BP (!) 144/82   Pulse 84   Temp 97.9 F (36.6 C) (Oral)   Wt 165 lb 8 oz (75.1 kg)   BMI 23.75 kg/m    CC: dry skin Subjective:    Patient ID: Troy [name], male    DOB: Feb 24, 1933, 83 y.o.   MRN: [id number]  HPI: Troy [name] is a 83 y.o. male presenting on 12/02/2016 for Dry skin (itches) and Check eye (right eye looks bruised; no recalled injury)   Wife suddenly passed away earlier this year, recent dx stage 4 lung cancer. Itchy dry skin on arms and legs. Some on back as well. No rash - more itchy dry skin.   He does use lukewarm shower - every other day.  No new medicines/supplements. No new foods. No new lotions, detergents, soaps or shampoos.   R eye bruised without recalled injury. New this morning. Thinks he hit eye in his sleep.   Relevant past medical, surgical, family and social history reviewed and updated as indicated. Interim medical history since our last visit reviewed. Allergies and medications reviewed and updated. Outpatient Medications Prior to Visit  Medication Sig Dispense Refill  . albuterol (PROVENTIL HFA;VENTOLIN HFA) 108 (90 Base) MCG/ACT inhaler Inhale 2 puffs into the lungs every 6 (six) hours as needed for wheezing or shortness of breath. 1 Inhaler 3  . aspirin 81 MG tablet Take 81 mg by mouth daily.     . benzonatate (TESSALON) 100 MG capsule Take 1 capsule (100 mg total) by mouth 3 (three) times daily as needed for cough. 30 capsule 0  . isosorbide mononitrate (IMDUR) 30 MG 24 hr tablet Take 0.5 tablets (15 mg total) by mouth daily. 45 tablet 3  . magnesium hydroxide (MILK OF MAGNESIA) 400 MG/5ML suspension Take 15 mLs by mouth daily as needed for moderate constipation. 360 mL 0  . metFORMIN (GLUCOPHAGE) 500 MG tablet TAKE 1 TABLET BY MOUTH TWO  TIMES DAILY WITH MEALS 180 tablet 3  . metoprolol tartrate (LOPRESSOR) 25 MG tablet Take 0.5 tablets (12.5 mg total) by mouth 2 (two) times daily. 90 tablet 3  . nitroGLYCERIN (NITROSTAT) 0.4 MG SL  tablet Place 1 tablet (0.4 mg total) under the tongue every 5 (five) minutes as needed. For chest pain-may repeat x3 30 tablet 3  . omeprazole (PRILOSEC) 40 MG capsule Take 1 capsule (40 mg total) by mouth daily. 90 capsule 3  . polyethylene glycol powder (GLYCOLAX/MIRALAX) powder Take 8.5 g by mouth daily. 3350 g 1  . simvastatin (ZOCOR) 40 MG tablet Take 1 tablet (40 mg total) by mouth at bedtime. 90 tablet 3  . triamcinolone cream (KENALOG) 0.1 % Apply 1 application topically 2 (two) times daily. Apply to AA. 80 g 0  . azithromycin (ZITHROMAX) 250 MG tablet Take two tablets on day one followed by one tablet on days 2-5 6 each 0   No facility-administered medications prior to visit.      Per HPI unless specifically indicated in ROS section below Review of Systems     Objective:    BP (!) 144/82   Pulse 84   Temp 97.9 F (36.6 C) (Oral)   Wt 165 lb 8 oz (75.1 kg)   BMI 23.75 kg/m   Wt Readings from Last 3 Encounters:  12/02/16 165 lb 8 oz (75.1 kg)  10/08/16 167 lb 12 oz (76.1 kg)  10/01/16 167 lb 12 oz (76.1 kg)    Physical Exam  Constitutional: He appears well-developed and well-nourished.  No distress.  Eyes: Conjunctivae and EOM are normal. Pupils are equal, round, and reactive to light. No scleral icterus.  Ecchymosis under R eye nasally  Skin: Skin is dry. Rash noted. No erythema.  Diffuse scaling of entire skin, mild pruritis No blistering, no significant erythema.   Nursing note and vitals reviewed.     Assessment & Plan:   Problem List Items Addressed This Visit    Dry skin dermatitis - Primary    ?acquired icthyosis. rec start treatment with supportive care of intermittent lukewarm showers (QOD), decrease heater temperature at home, regular moistuirizing cream (Aveeno or eucerin) twice daily, and PRN triamcinolone cream (jar eRx today).  If no better, consider derm referral vs further evaluation. Pt agrees with plan.       Ecchymosis of right eye    Reassured.  Continue to monitor.           Follow up plan: Return if symptoms worsen or fail to improve.  [name], MD

## 2017-04-04 ENCOUNTER — Ambulatory Visit (INDEPENDENT_AMBULATORY_CARE_PROVIDER_SITE_OTHER): Payer: Medicare Other | Admitting: Family Medicine

## 2017-04-04 ENCOUNTER — Encounter: Payer: Self-pay | Admitting: Family Medicine

## 2017-04-04 VITALS — BP 130/60 | HR 60 | Temp 97.8°F | Wt 163.2 lb

## 2017-04-04 DIAGNOSIS — H6982 Other specified disorders of Eustachian tube, left ear: Secondary | ICD-10-CM | POA: Insufficient documentation

## 2017-04-04 DIAGNOSIS — H6980 Other specified disorders of Eustachian tube, unspecified ear: Secondary | ICD-10-CM

## 2017-04-04 NOTE — Patient Instructions (Signed)
Gently pop your ears a few times a day.  Use nasal saline if needed.  You can use OTC flonase for a few days if still having trouble.  Update [place] as needed.  Take care.  Glad to see you.

## 2017-04-04 NOTE — Assessment & Plan Note (Signed)
D/w pt.  See AVS, valsalva, nasal saline, flonase if needed.  He agrees.  Update [place] as needed.  Not likely that minimal wax contributing.

## 2017-04-04 NOTE — Progress Notes (Signed)
B ear sx for the last few days.  Some better today.  No FCNAVD.  Stuffy.  precontemplative about smoking cessation.   Meds, vitals, and allergies reviewed.   ROS: Per HPI unless specifically indicated in ROS section   nad ncat TM w/o erythema, no wax in R canal, minimal in L Slow TM movement on R TM with valsalva Nasal exam stuffy OP wnl Neck supple, no LA

## 2017-04-07 ENCOUNTER — Ambulatory Visit: Payer: Medicare Other | Admitting: Family Medicine

## 2017-05-08 ENCOUNTER — Telehealth: Payer: Self-pay

## 2017-05-08 NOTE — Telephone Encounter (Signed)
Elenore Rota pts son (do not see DPR signed) left v/m requesting a letter re: competency for estate planning faxed to [employer] fax # [phone]. 10/08/16 last annual.Please advise.

## 2017-05-12 NOTE — Telephone Encounter (Signed)
Pt returned call, please call [phone]

## 2017-05-12 NOTE — Telephone Encounter (Signed)
Letter faxed as requested

## 2017-05-12 NOTE — Telephone Encounter (Signed)
Letter written and in CMA box.

## 2017-05-12 NOTE — Telephone Encounter (Signed)
Patient returned [employer] call.  Patient said it's fine to fax letter to [employer].  I let patient and his son know they need to fill out a DPR the next time patient has an appointment.

## 2017-05-12 NOTE — Telephone Encounter (Signed)
Lm on pts vm requesting a call back. Per Dr Darnell Level, verify ok with pt before sending as son is not on [facility]

## 2017-05-22 ENCOUNTER — Encounter: Payer: Self-pay | Admitting: Family Medicine

## 2017-05-22 ENCOUNTER — Ambulatory Visit (INDEPENDENT_AMBULATORY_CARE_PROVIDER_SITE_OTHER): Payer: Medicare Other | Admitting: Family Medicine

## 2017-05-22 VITALS — BP 154/70 | HR 60 | Temp 97.9°F | Wt 162.2 lb

## 2017-05-22 DIAGNOSIS — E1159 Type 2 diabetes mellitus with other circulatory complications: Secondary | ICD-10-CM | POA: Insufficient documentation

## 2017-05-22 DIAGNOSIS — H6982 Other specified disorders of Eustachian tube, left ear: Secondary | ICD-10-CM | POA: Diagnosis not present

## 2017-05-22 DIAGNOSIS — I10 Essential (primary) hypertension: Secondary | ICD-10-CM | POA: Diagnosis not present

## 2017-05-22 MED ORDER — FLUTICASONE PROPIONATE 50 MCG/ACT NA SUSP: 2.0000 | Freq: Every day | NASAL | 2 refills | Status: DC

## 2017-05-22 NOTE — Progress Notes (Signed)
BP (!) 154/70 (BP Location: Right Arm, Cuff Size: Normal)   Pulse 60   Temp 97.9 F (36.6 C) (Oral)   Wt 162 lb 4 oz (73.6 kg)   SpO2 98%   BMI 23.28 kg/m    CC: L ear congestion Subjective:    Patient ID: Troy [name], male    DOB: [birth year], 83 y.o.   MRN: [id number]  HPI: Troy [name] is a 83 y.o. male presenting on 05/22/2017 for Ear Pain (left)   Here alone today.  Seen by Dr Damita Dunnings 03/2017 dx with eustachian tube dysfunction R>L treated with nasal saline and OTC flonase.   L ear clogged up for the last 3 weeks. Blowing nose hasn't helped. Denies earache. Mild congestion. Decreased hearing. No fevers/chills. No drainage from the ear.   Forgot to take bp pills this morning.  Declines flu shot  Relevant past medical, surgical, family and social history reviewed and updated as indicated. Interim medical history since our last visit reviewed. Allergies and medications reviewed and updated. Outpatient Medications Prior to Visit  Medication Sig Dispense Refill  . albuterol (PROVENTIL HFA;VENTOLIN HFA) 108 (90 Base) MCG/ACT inhaler Inhale 2 puffs into the lungs every 6 (six) hours as needed for wheezing or shortness of breath. 1 Inhaler 3  . aspirin 81 MG tablet Take 81 mg by mouth daily.     . isosorbide mononitrate (IMDUR) 30 MG 24 hr tablet Take 0.5 tablets (15 mg total) by mouth daily. 45 tablet 3  . magnesium hydroxide (MILK OF MAGNESIA) 400 MG/5ML suspension Take 15 mLs by mouth daily as needed for moderate constipation. 360 mL 0  . metFORMIN (GLUCOPHAGE) 500 MG tablet TAKE 1 TABLET BY MOUTH TWO  TIMES DAILY WITH MEALS 180 tablet 3  . metoprolol tartrate (LOPRESSOR) 25 MG tablet Take 0.5 tablets (12.5 mg total) by mouth 2 (two) times daily. 90 tablet 3  . nitroGLYCERIN (NITROSTAT) 0.4 MG SL tablet Place 1 tablet (0.4 mg total) under the tongue every 5 (five) minutes as needed. For chest pain-may repeat x3 30 tablet 3  . omeprazole (PRILOSEC) 40 MG capsule Take 1  capsule (40 mg total) by mouth daily. 90 capsule 3  . polyethylene glycol powder (GLYCOLAX/MIRALAX) powder Take 8.5 g by mouth daily. 3350 g 1  . simvastatin (ZOCOR) 40 MG tablet Take 1 tablet (40 mg total) by mouth at bedtime. 90 tablet 3  . triamcinolone cream (KENALOG) 0.1 % Apply 1 application topically 2 (two) times daily. Apply to AA for no more than 2 weeks at a time. 453.6 g 0   No facility-administered medications prior to visit.      Per HPI unless specifically indicated in ROS section below Review of Systems     Objective:    BP (!) 154/70 (BP Location: Right Arm, Cuff Size: Normal)   Pulse 60   Temp 97.9 F (36.6 C) (Oral)   Wt 162 lb 4 oz (73.6 kg)   SpO2 98%   BMI 23.28 kg/m   Wt Readings from Last 3 Encounters:  05/22/17 162 lb 4 oz (73.6 kg)  04/04/17 163 lb 4 oz (74 kg)  12/02/16 165 lb 8 oz (75.1 kg)    Physical Exam  Constitutional: He appears well-developed and well-nourished. No distress.  HENT:  Head: Normocephalic and atraumatic.  Right Ear: External ear and ear canal normal. Decreased hearing is noted.  Left Ear: External ear and ear canal normal. Decreased hearing is noted.  Nose: Mucosal edema (marked  nasal mucosal pallor and swelling) present. No rhinorrhea. Right sinus exhibits no maxillary sinus tenderness and no frontal sinus tenderness. Left sinus exhibits no maxillary sinus tenderness and no frontal sinus tenderness.  Mouth/Throat: Uvula is midline, oropharynx is clear and moist and mucous membranes are normal. No oropharyngeal exudate, posterior oropharyngeal edema, posterior oropharyngeal erythema or tonsillar abscesses.  Fluid behind TMs bilaterally R canal clear L canal with small amt cerumen present - s/p irrigation performed today  Eyes: Pupils are equal, round, and reactive to light. Conjunctivae and EOM are normal. No scleral icterus.  Neck: Normal range of motion. Neck supple.  Cardiovascular: Normal rate, regular rhythm, normal heart  sounds and intact distal pulses.   No murmur heard. Pulmonary/Chest: Effort normal and breath sounds normal. No respiratory distress. He has no wheezes. He has no rales.  Lymphadenopathy:    He has no cervical adenopathy.  Skin: Skin is warm and dry. No rash noted.  Nursing note and vitals reviewed.  Results for orders placed or performed in visit on 10/01/16  Lipid panel  Result Value Ref Range   Cholesterol 136 0 - 200 mg/dL   Triglycerides 104.0 0.0 - 149.0 mg/dL   HDL 34.20 (L) >39.00 mg/dL   VLDL 20.8 0.0 - 40.0 mg/dL   LDL Cholesterol 81 0 - 99 mg/dL   Total CHOL/HDL Ratio 4    NonHDL 101.69   Comprehensive metabolic panel  Result Value Ref Range   Sodium 138 135 - 145 mEq/L   Potassium 4.8 3.5 - 5.1 mEq/L   Chloride 104 96 - 112 mEq/L   CO2 27 19 - 32 mEq/L   Glucose, Bld 100 (H) 70 - 99 mg/dL   BUN 20 6 - 23 mg/dL   Creatinine, Ser 1.19 0.40 - 1.50 mg/dL   Total Bilirubin 0.4 0.2 - 1.2 mg/dL   Alkaline Phosphatase 109 39 - 117 U/L   AST 16 0 - 37 U/L   ALT 16 0 - 53 U/L   Total Protein 7.2 6.0 - 8.3 g/dL   Albumin 4.2 3.5 - 5.2 g/dL   Calcium 9.6 8.4 - 10.5 mg/dL   GFR 62.33 >60.00 mL/min  Hemoglobin A1c  Result Value Ref Range   Hgb A1c MFr Bld 6.7 (H) 4.6 - 6.5 %  CBC with Differential/Platelet  Result Value Ref Range   WBC 6.4 4.0 - 10.5 K/uL   RBC 4.19 (L) 4.22 - 5.81 Mil/uL   Hemoglobin 11.8 (L) 13.0 - 17.0 g/dL   HCT 35.6 (L) 39.0 - 52.0 %   MCV 85.0 78.0 - 100.0 fl   MCHC 33.2 30.0 - 36.0 g/dL   RDW 15.7 (H) 11.5 - 15.5 %   Platelets 153.0 150.0 - 400.0 K/uL   Neutrophils Relative % 54.6 43.0 - 77.0 %   Lymphocytes Relative 35.2 12.0 - 46.0 %   Monocytes Relative 8.2 3.0 - 12.0 %   Eosinophils Relative 1.6 0.0 - 5.0 %   Basophils Relative 0.4 0.0 - 3.0 %   Neutro Abs 3.5 1.4 - 7.7 K/uL   Lymphs Abs 2.2 0.7 - 4.0 K/uL   Monocytes Absolute 0.5 0.1 - 1.0 K/uL   Eosinophils Absolute 0.1 0.0 - 0.7 K/uL   Basophils Absolute 0.0 0.0 - 0.1 K/uL  TSH    Result Value Ref Range   TSH 3.03 0.35 - 4.50 uIU/mL  Microalbumin / creatinine urine ratio  Result Value Ref Range   Microalb, Ur 3.1 (H) 0.0 - 1.9 mg/dL  Creatinine,U 66.8 mg/dL   Microalb Creat Ratio 4.6 0.0 - 30.0 mg/g      Assessment & Plan:   Problem List Items Addressed This Visit    Eustachian tube dysfunction, left - Primary    Persistent. Minimal wax.  He has not used flonase. rec start this along with nasal saline.  Irrigation performed today.  Handout provided.  No signs of AOM.       Hypertension    He states he forgot to take bp meds this morning. He will take once he gets home.           Follow up plan: No Follow-up on file.  [name], MD

## 2017-05-22 NOTE — Patient Instructions (Signed)
Ear irrigation today. Start nasal saline throughout the day as well as flonase nasal steroid for sinus and ear congestion leading to decreased hearing.  Let us know if no better after 1-2 weeks of treatment with nasal steroid.   Eustachian Tube Dysfunction The eustachian tube connects the middle ear to the back of the nose. It regulates air pressure in the middle ear by allowing air to move between the ear and nose. It also helps to drain fluid from the middle ear space. When the eustachian tube does not function properly, air pressure, fluid, or both can build up in the middle ear. Eustachian tube dysfunction can affect one or both ears. What are the causes? This condition happens when the eustachian tube becomes blocked or cannot open normally. This may result from:  Ear infections.  Colds and other upper respiratory infections.  Allergies.  Irritation, such as from cigarette smoke or acid from the stomach coming up into the esophagus (gastroesophageal reflux).  Sudden changes in air pressure, such as from descending in an airplane.  Abnormal growths in the nose or throat, such as nasal polyps, tumors, or enlarged tissue at the back of the throat (adenoids).  What increases the risk? This condition may be more likely to develop in people who smoke and people who are overweight. Eustachian tube dysfunction may also be more likely to develop in children, especially children who have:  Certain birth defects of the mouth, such as cleft palate.  Large tonsils and adenoids.  What are the signs or symptoms? Symptoms of this condition may include:  A feeling of fullness in the ear.  Ear pain.  Clicking or popping noises in the ear.  Ringing in the ear.  Hearing loss.  Loss of balance.  Symptoms may get worse when the air pressure around you changes, such as when you travel to an area of high elevation or fly on an airplane. How is this diagnosed? This condition may be  diagnosed based on:  Your symptoms.  A physical exam of your ear, nose, and throat.  Tests, such as those that measure: ? The movement of your eardrum (tympanogram). ? Your hearing (audiometry).  How is this treated? Treatment depends on the cause and severity of your condition. If your symptoms are mild, you may be able to relieve your symptoms by moving air into ("popping") your ears. If you have symptoms of fluid in your ears, treatment may include:  Decongestants.  Antihistamines.  Nasal sprays or ear drops that contain medicines that reduce swelling (steroids).  In some cases, you may need to have a procedure to drain the fluid in your eardrum (myringotomy). In this procedure, a small tube is placed in the eardrum to:  Drain the fluid.  Restore the air in the middle ear space.  Follow these instructions at home:  Take over-the-counter and prescription medicines only as told by your health care provider.  Use techniques to help pop your ears as recommended by your health care provider. These may include: ? Chewing gum. ? Yawning. ? Frequent, forceful swallowing. ? Closing your mouth, holding your nose closed, and gently blowing as if you are trying to blow air out of your nose.  Do not do any of the following until your health care provider approves: ? Travel to high altitudes. ? Fly in airplanes. ? Work in a Pension scheme manager or room. ? Scuba dive.  Keep your ears dry. Dry your ears completely after showering or bathing.  Do not  smoke.  Keep all follow-up visits as told by your health care provider. This is important. Contact a health care provider if:  Your symptoms do not go away after treatment.  Your symptoms come back after treatment.  You are unable to pop your ears.  You have: ? A fever. ? Pain in your ear. ? Pain in your head or neck. ? Fluid draining from your ear.  Your hearing suddenly changes.  You become very dizzy.  You lose your  balance. This information is not intended to replace advice given to you by your health care provider. Make sure you discuss any questions you have with your health care provider. Document Released: 09/29/2015 Document Revised: 02/08/2016 Document Reviewed: 09/21/2014 Elsevier Interactive Patient Education  [employer].

## 2017-05-22 NOTE — Telephone Encounter (Signed)
Patient's attorney said he didn't receive letter.  Please give copy of letter to patient at his visit today.  Please make sure patient fills out DPR to allow his son to speak to [place].

## 2017-05-22 NOTE — Telephone Encounter (Signed)
Copy given to pt at OV

## 2017-05-22 NOTE — Assessment & Plan Note (Signed)
He states he forgot to take bp meds this morning. He will take once he gets home.

## 2017-05-22 NOTE — Assessment & Plan Note (Addendum)
Persistent. Minimal wax.  He has not used flonase. rec start this along with nasal saline.  Irrigation performed today.  Handout provided.  No signs of AOM.

## 2017-07-04 ENCOUNTER — Encounter: Payer: Self-pay | Admitting: Family Medicine

## 2017-07-04 ENCOUNTER — Ambulatory Visit (INDEPENDENT_AMBULATORY_CARE_PROVIDER_SITE_OTHER): Payer: Medicare Other | Admitting: Family Medicine

## 2017-07-04 VITALS — BP 140/58 | HR 63 | Temp 97.8°F | Wt 164.0 lb

## 2017-07-04 DIAGNOSIS — H6592 Unspecified nonsuppurative otitis media, left ear: Secondary | ICD-10-CM

## 2017-07-04 MED ORDER — AMOXICILLIN 500 MG PO CAPS: 500.0000 mg | ORAL_CAPSULE | Freq: Two times a day (BID) | ORAL | 0 refills | Status: DC

## 2017-07-04 NOTE — Assessment & Plan Note (Signed)
In h/o ETD, now anticipate serous otitis, unable to r/o perf. Will treat with amoxicillin course. If not improved with treatment, will refer to ENT. Pt agrees with plan.

## 2017-07-04 NOTE — Progress Notes (Signed)
BP (!) 140/58 (BP Location: Left Arm, Patient Position: Sitting, Cuff Size: Normal)   Pulse 63   Temp 97.8 F (36.6 C) (Oral)   Wt 164 lb (74.4 kg)   SpO2 97%   BMI 23.53 kg/m    CC: ear fullness L Subjective:    Patient ID: Troy [name], male    DOB: [birth year], 83 y.o.   MRN: [id number]  HPI: Troy [name] is a 83 y.o. male presenting on 07/04/2017 for Ear Fullness (left feels stopped up "like before". Has clear nasal drainage but thinks it causing ear issue)   Battling with L sided ETD over last several months - saw Dr Damita Dunnings then myself.  Last month we performed successful L ear irrigation and I recommended flonase nasal steroid. No improvement with this.   Persistent L ear congestion sensation, now with new L clear rhinorrhea. No drainage out of the ear. No ear pain or significant changes in hearing. This ear congestion comes and goes. He is able to clear his ears with valsavla but then muffled hearing returns.  Denies fevers/chills, headaches, facial pain, coughing, ST or PNDrainage. Denies dizziness or vertigo.   Relevant past medical, surgical, family and social history reviewed and updated as indicated. Interim medical history since our last visit reviewed. Allergies and medications reviewed and updated. Outpatient Medications Prior to Visit  Medication Sig Dispense Refill  . albuterol (PROVENTIL HFA;VENTOLIN HFA) 108 (90 Base) MCG/ACT inhaler Inhale 2 puffs into the lungs every 6 (six) hours as needed for wheezing or shortness of breath. 1 Inhaler 3  . aspirin 81 MG tablet Take 81 mg by mouth daily.     . fluticasone (FLONASE) 50 MCG/ACT nasal spray Place 2 sprays into both nostrils daily. 16 g 2  . isosorbide mononitrate (IMDUR) 30 MG 24 hr tablet Take 0.5 tablets (15 mg total) by mouth daily. 45 tablet 3  . magnesium hydroxide (MILK OF MAGNESIA) 400 MG/5ML suspension Take 15 mLs by mouth daily as needed for moderate constipation. 360 mL 0  . metFORMIN  (GLUCOPHAGE) 500 MG tablet TAKE 1 TABLET BY MOUTH TWO  TIMES DAILY WITH MEALS 180 tablet 3  . metoprolol tartrate (LOPRESSOR) 25 MG tablet Take 0.5 tablets (12.5 mg total) by mouth 2 (two) times daily. 90 tablet 3  . nitroGLYCERIN (NITROSTAT) 0.4 MG SL tablet Place 1 tablet (0.4 mg total) under the tongue every 5 (five) minutes as needed. For chest pain-may repeat x3 30 tablet 3  . omeprazole (PRILOSEC) 40 MG capsule Take 1 capsule (40 mg total) by mouth daily. 90 capsule 3  . polyethylene glycol powder (GLYCOLAX/MIRALAX) powder Take 8.5 g by mouth daily. 3350 g 1  . simvastatin (ZOCOR) 40 MG tablet Take 1 tablet (40 mg total) by mouth at bedtime. 90 tablet 3  . triamcinolone cream (KENALOG) 0.1 % Apply 1 application topically 2 (two) times daily. Apply to AA for no more than 2 weeks at a time. 453.6 g 0   No facility-administered medications prior to visit.      Per HPI unless specifically indicated in ROS section below Review of Systems     Objective:    BP (!) 140/58 (BP Location: Left Arm, Patient Position: Sitting, Cuff Size: Normal)   Pulse 63   Temp 97.8 F (36.6 C) (Oral)   Wt 164 lb (74.4 kg)   SpO2 97%   BMI 23.53 kg/m   Wt Readings from Last 3 Encounters:  07/04/17 164 lb (74.4 kg)  05/22/17  162 lb 4 oz (73.6 kg)  04/04/17 163 lb 4 oz (74 kg)    Physical Exam  Constitutional: He appears well-developed and well-nourished. No distress.  HENT:  Head: Normocephalic and atraumatic.  Right Ear: Hearing, tympanic membrane, external ear and ear canal normal.  Left Ear: Hearing normal. There is drainage.  Nose: Nose normal. No mucosal edema or rhinorrhea. Right sinus exhibits no maxillary sinus tenderness and no frontal sinus tenderness. Left sinus exhibits no maxillary sinus tenderness and no frontal sinus tenderness.  Mouth/Throat: Uvula is midline, oropharynx is clear and moist and mucous membranes are normal. No oropharyngeal exudate, posterior oropharyngeal edema,  posterior oropharyngeal erythema or tonsillar abscesses.  L TM not appreciated - he does have straw colored drainage in ear canal  Eyes: Pupils are equal, round, and reactive to light. Conjunctivae and EOM are normal. No scleral icterus.  Neck: Normal range of motion. Neck supple.  Lymphadenopathy:    He has no cervical adenopathy.  Skin: Skin is warm and dry. No rash noted.  Nursing note and vitals reviewed.      Assessment & Plan:   Problem List Items Addressed This Visit    Left serous otitis media - Primary    In h/o ETD, now anticipate serous otitis, unable to r/o perf. Will treat with amoxicillin course. If not improved with treatment, will refer to ENT. Pt agrees with plan.       Relevant Medications   amoxicillin (AMOXIL) 500 MG capsule       Follow up plan: No Follow-up on file.  [name], MD

## 2017-07-04 NOTE — Patient Instructions (Addendum)
Flu shot today You do have congestion of the left ear drum and inflammation. Treat with antibiotic sent to pharmacy.  If not improving with treatment, let us know for referral to ENT.

## 2017-07-10 ENCOUNTER — Ambulatory Visit: Payer: Medicare Other

## 2017-07-21 ENCOUNTER — Telehealth: Payer: Self-pay | Admitting: *Deleted

## 2017-07-21 ENCOUNTER — Ambulatory Visit: Payer: Self-pay | Admitting: *Deleted

## 2017-07-21 DIAGNOSIS — H6690 Otitis media, unspecified, unspecified ear: Secondary | ICD-10-CM

## 2017-07-21 NOTE — Addendum Note (Signed)
Addended by: Tonia Ghent on: 07/21/2017 02:14 PM   Modules accepted: Orders

## 2017-07-21 NOTE — Telephone Encounter (Signed)
Notified that pt does not need to be seen at PCP's office;  Pt to be referred to ENT; awaiting further instructions that will be given to patient; Pt ok with message being left on voicemail if he is not available to answer phone.

## 2017-07-21 NOTE — Telephone Encounter (Signed)
Awaiting referral information from PCP; Pt made aware of waiting to here from office related to ENT referral; Pt is agreeable and can be reached by phone; Per pt ok to leave message on voicemail

## 2017-07-21 NOTE — Telephone Encounter (Signed)
per 07/04/17 office note if treatment not help will do ENT referral. pt does not need another appt at [facility]. will send request to provider covering Dr Synthia Innocent pts and pt should get cb from [facility].

## 2017-07-21 NOTE — Telephone Encounter (Signed)
Michelle at [facility] has pt on phone and he wants to speak with someone now about ENT referral. I spoke with pt and pt advised per Dr Josefine Class note from today. Pt said he has lost significant hearing in lt ear; When pt saw Dr Darnell Level could not get out all the wax in the ear and was advised would send pt to ENT doctor. Pt is speaking with Rosaria Fer[facility] about ENT appt.

## 2017-07-21 NOTE — Telephone Encounter (Signed)
Referral ordered the same day we got the call.  I had to review PCP's notes.  What are his current sx?  Is needing help in the meantime we can try to get him seen.  I don't know when ENT will have OV available for patient.

## 2017-07-21 NOTE — Telephone Encounter (Signed)
This skyped message received: Patient called very upset that he has been waiting all morning for a call back. He is very irrate. i explained to him that the office has been very busy. He stated that this is unreal. He demands a call asap. I advised him that I would send a message, but cannot guarantee a call back right away... Sorry, Patient's is Troy [name] (MRN # [id number])

## 2017-07-21 NOTE — Telephone Encounter (Signed)
Patient states he has an appointment tomorrow 07/22/17 at 2:15 pm.

## 2017-07-21 NOTE — Telephone Encounter (Signed)
Left ear still stopped up;  is requesting appointment to see Dr Devin Going; pt was seen a while ago for Left ear "being Clogged" Pt states that Dr Leo Grosser was going to refer him to an ear specialist.  Last seen in office 07/04/17    Reason for Disposition . Ear congestion present > 48 hours  Answer Assessment - Initial Assessment Questions 1. LOCATION: "Which ear is involved?"       left 2. SENSATION: "Describe how the ear feels."      Clogged up 3. ONSET:  "When did the ear symptoms start?"       Its been a while 4. PAIN: "Do you also have an earache?" If so, ask: "How bad is it?" (Scale 1-10; or mild, moderate, severe)     no 5. CAUSE: "What do you think is causing the ear congestion?"     Unknown 6. URI: "Do you have a runny nose or cough?"      no 7. NASAL ALLERGIES: "Are there symptoms of hay fever, such as sneezing or a clear nasal discharge?"     No; but blows it often, clear mucus 8. PREGNANCY: "Is there any chance you are pregnant?" "When was your last menstrual period?"     n/a  Protocols used: EAR - CONGESTION-A-AH

## 2017-07-22 DIAGNOSIS — H60333 Swimmer's ear, bilateral: Secondary | ICD-10-CM | POA: Diagnosis not present

## 2017-07-22 DIAGNOSIS — H6123 Impacted cerumen, bilateral: Secondary | ICD-10-CM | POA: Diagnosis not present

## 2017-07-22 NOTE — Telephone Encounter (Signed)
Noted. Thanks.

## 2017-07-31 ENCOUNTER — Ambulatory Visit: Payer: Medicare Other | Admitting: Family Medicine

## 2017-08-13 ENCOUNTER — Telehealth: Payer: Self-pay

## 2017-08-13 ENCOUNTER — Ambulatory Visit: Payer: Self-pay | Admitting: *Deleted

## 2017-08-13 NOTE — Telephone Encounter (Signed)
Pt called requesting an appointment for sore throat with Dr Danise Mina, no appointments available today; Rena at [facility] pool conference call with pt, and offered the pt appointment with another provider today or option of urgent care; pt opted for appointment with Dr Danise Mina on Thursday 08/14/17 at 1015  Reason for Disposition . Diabetes mellitus or weak immune system (e.g., HIV positive, cancer chemo, splenectomy, organ transplant)  Answer Assessment - Initial Assessment Questions 1. ONSET: "When did the throat start hurting?" (Hours or days ago)       1 week ago 2. SEVERITY: "How bad is the sore throat?" (Scale 1-10; mild, moderate or severe)   - MILD (1-3):  doesn't interfere with eating or normal activities   - MODERATE (4-7): interferes with eating some solids and normal activities   - SEVERE (8-10):  excruciating pain, interferes with most normal activities   - SEVERE DYSPHAGIA: can't swallow liquids, drooling     moderate 3. STREP EXPOSURE: "Has there been any exposure to strep within the past week?" If so, ask: "What type of contact occurred?"      no 4.  VIRAL SYMPTOMS: "Are there any symptoms of a cold, such as a runny nose, cough, hoarse voice or red eyes?"      Cough, runny nose 5. FEVER: "Do you have a fever?" If so, ask: "What is your temperature, how was it measured, and when did it start?"     no 6. PUS ON THE TONSILS: "Is there pus on the tonsils in the back of your throat?"     no 7. OTHER SYMPTOMS: "Do you have any other symptoms?" (e.g., difficulty breathing, headache, rash)     no 8. PREGNANCY: "Is there any chance you are pregnant?" "When was your last menstrual period?"     no  Protocols used: SORE THROAT-A-AH

## 2017-08-13 NOTE — Telephone Encounter (Signed)
Copied from [place] [id number]. Topic: Inquiry >> Aug 13, 2017  8:09 AM Pricilla Handler wrote: Reason for CRM: Patient has requested to see Dr. Darnell Level Today. I advised the patient that Dr. Darnell Level has no appt available today. Patient stated that he has a very bad Sore Throat and Coughing. Patient wants Dr. Darnell Level to see him Today. Patient also wants Dr. Darnell Level to call him today ASAP.

## 2017-08-13 NOTE — Telephone Encounter (Signed)
Noted  

## 2017-08-13 NOTE — Telephone Encounter (Signed)
Pt has appt with Dr Darnell Level on 08/14/17 at 10:15.

## 2017-08-14 ENCOUNTER — Ambulatory Visit (INDEPENDENT_AMBULATORY_CARE_PROVIDER_SITE_OTHER): Payer: Medicare Other | Admitting: Family Medicine

## 2017-08-14 ENCOUNTER — Encounter: Payer: Self-pay | Admitting: Family Medicine

## 2017-08-14 VITALS — BP 118/60 | HR 61 | Temp 98.0°F | Wt 162.2 lb

## 2017-08-14 DIAGNOSIS — F17200 Nicotine dependence, unspecified, uncomplicated: Secondary | ICD-10-CM | POA: Diagnosis not present

## 2017-08-14 DIAGNOSIS — J029 Acute pharyngitis, unspecified: Secondary | ICD-10-CM

## 2017-08-14 DIAGNOSIS — J441 Chronic obstructive pulmonary disease with (acute) exacerbation: Secondary | ICD-10-CM

## 2017-08-14 MED ORDER — DOXYCYCLINE HYCLATE 100 MG PO TABS: 100.0000 mg | ORAL_TABLET | Freq: Two times a day (BID) | ORAL | 0 refills | Status: DC

## 2017-08-14 NOTE — Progress Notes (Signed)
BP 118/60 (BP Location: Left Arm, Patient Position: Sitting, Cuff Size: Normal)   Pulse 61   Temp 98 F (36.7 C) (Oral)   Wt 162 lb 4 oz (73.6 kg)   SpO2 98%   BMI 23.28 kg/m    CC: ST, cough Subjective:    Patient ID: Troy [name], male    DOB: January 05, 1933, 83 y.o.   MRN: [id number]  HPI: Troy [name] is a 83 y.o. male presenting on 08/14/2017 for Sore Throat (Started about 1 wk ago. Has taken OTC cold/cough, barely helpful) and Cough   1 wk h/o ST and cough, rhinorrhea. Staying short winded with some wheezing. Coughing up phlegm - more sputum production. Some PNDrainage. Head > chest congestion.   Has tried OTC remedies.  Current smoker <1ppd. No fevers or chills. No worsening body aches. No ear or tooth pain. No headaches.   Relevant past medical, surgical, family and social history reviewed and updated as indicated. Interim medical history since our last visit reviewed. Allergies and medications reviewed and updated. Outpatient Medications Prior to Visit  Medication Sig Dispense Refill  . albuterol (PROVENTIL HFA;VENTOLIN HFA) 108 (90 Base) MCG/ACT inhaler Inhale 2 puffs into the lungs every 6 (six) hours as needed for wheezing or shortness of breath. 1 Inhaler 3  . amoxicillin (AMOXIL) 500 MG capsule Take 1 capsule (500 mg total) by mouth 2 (two) times daily. 20 capsule 0  . aspirin 81 MG tablet Take 81 mg by mouth daily.     . fluticasone (FLONASE) 50 MCG/ACT nasal spray Place 2 sprays into both nostrils daily. 16 g 2  . isosorbide mononitrate (IMDUR) 30 MG 24 hr tablet Take 0.5 tablets (15 mg total) by mouth daily. 45 tablet 3  . magnesium hydroxide (MILK OF MAGNESIA) 400 MG/5ML suspension Take 15 mLs by mouth daily as needed for moderate constipation. 360 mL 0  . metFORMIN (GLUCOPHAGE) 500 MG tablet TAKE 1 TABLET BY MOUTH TWO  TIMES DAILY WITH MEALS 180 tablet 3  . metoprolol tartrate (LOPRESSOR) 25 MG tablet Take 0.5 tablets (12.5 mg total) by mouth 2 (two) times  daily. 90 tablet 3  . nitroGLYCERIN (NITROSTAT) 0.4 MG SL tablet Place 1 tablet (0.4 mg total) under the tongue every 5 (five) minutes as needed. For chest pain-may repeat x3 30 tablet 3  . omeprazole (PRILOSEC) 40 MG capsule Take 1 capsule (40 mg total) by mouth daily. 90 capsule 3  . polyethylene glycol powder (GLYCOLAX/MIRALAX) powder Take 8.5 g by mouth daily. 3350 g 1  . simvastatin (ZOCOR) 40 MG tablet Take 1 tablet (40 mg total) by mouth at bedtime. 90 tablet 3  . triamcinolone cream (KENALOG) 0.1 % Apply 1 application topically 2 (two) times daily. Apply to AA for no more than 2 weeks at a time. 453.6 g 0   No facility-administered medications prior to visit.      Per HPI unless specifically indicated in ROS section below Review of Systems     Objective:    BP 118/60 (BP Location: Left Arm, Patient Position: Sitting, Cuff Size: Normal)   Pulse 61   Temp 98 F (36.7 C) (Oral)   Wt 162 lb 4 oz (73.6 kg)   SpO2 98%   BMI 23.28 kg/m   Wt Readings from Last 3 Encounters:  08/14/17 162 lb 4 oz (73.6 kg)  07/04/17 164 lb (74.4 kg)  05/22/17 162 lb 4 oz (73.6 kg)    Physical Exam  Constitutional: He appears well-developed  and well-nourished. No distress.  HENT:  Head: Normocephalic and atraumatic.  Right Ear: Hearing, tympanic membrane, external ear and ear canal normal.  Left Ear: Hearing, tympanic membrane, external ear and ear canal normal.  Nose: Mucosal edema (nasal mucosal congestion) present. No rhinorrhea. Right sinus exhibits no maxillary sinus tenderness and no frontal sinus tenderness. Left sinus exhibits no maxillary sinus tenderness and no frontal sinus tenderness.  Mouth/Throat: Uvula is midline, oropharynx is clear and moist and mucous membranes are normal. No oropharyngeal exudate, posterior oropharyngeal edema, posterior oropharyngeal erythema or tonsillar abscesses.  R tonsillar inflammation  Eyes: Conjunctivae and EOM are normal. Pupils are equal, round, and  reactive to light. No scleral icterus.  Neck: Normal range of motion. Neck supple.  Cardiovascular: Normal rate, regular rhythm, normal heart sounds and intact distal pulses.  No murmur heard. Pulmonary/Chest: Effort normal and breath sounds normal. No respiratory distress. He has no wheezes. He has no rales.  Lymphadenopathy:    He has no cervical adenopathy.  Skin: Skin is warm and dry. No rash noted.  Nursing note and vitals reviewed.  Lab Results  Component Value Date   CREATININE 1.19 10/01/2016       Assessment & Plan:  RST today negative Problem List Items Addressed This Visit    COPD (chronic obstructive pulmonary disease) ([place]) - Primary    H/o severe COPD, currently symptoms consistent with mild exacerbation but without wheezing. Will cover with doxycycline 7d course. No need for prednisone.       Smoker    Pt not likely to quit smoking.        Other Visit Diagnoses    Pharyngitis, unspecified etiology           Follow up plan: Return in about 2 months (around 10/14/2017) for annual exam, prior fasting for blood work, medicare wellness visit.  [name], MD

## 2017-08-14 NOTE — Assessment & Plan Note (Signed)
Pt not likely to quit smoking.

## 2017-08-14 NOTE — Assessment & Plan Note (Signed)
H/o severe COPD, currently symptoms consistent with mild exacerbation but without wheezing. Will cover with doxycycline 7d course. No need for prednisone.

## 2017-08-14 NOTE — Patient Instructions (Addendum)
You have COPD exacerbation - treat with 1 week doxycycline course.  Continue albuterol inhaler as needed for shortness of breath or wheezing.  Let us know if fever >101 or worsening productive cough or not improving with treatment.  Return in 2 months for medicare wellness and physical

## 2017-08-30 ENCOUNTER — Other Ambulatory Visit: Payer: Self-pay | Admitting: Family Medicine

## 2017-09-10 ENCOUNTER — Ambulatory Visit: Payer: Self-pay | Admitting: *Deleted

## 2017-09-10 NOTE — Telephone Encounter (Signed)
Pt calls complaining of sore throat starting 09/07/17; nurse triage initiated and recommendation for pt to see MD within 24 hours; pt offered and accepted appointment with Dr Lorelei Pont at 1030 on 09/11/17; pt verbalizes understanding.  Reason for Disposition . SEVERE (e.g., excruciating) throat pain  Answer Assessment - Initial Assessment Questions 1. ONSET: "When did the throat start hurting?" (Hours or days ago)      09/07/17 2. SEVERITY: "How bad is the sore throat?" (Scale 1-10; mild, moderate or severe)   - MILD (1-3):  doesn't interfere with eating or normal activities   - MODERATE (4-7): interferes with eating some solids and normal activities   - SEVERE (8-10):  excruciating pain, interferes with most normal activities   - SEVERE DYSPHAGIA: can't swallow liquids, drooling     Severe rated 8-9 out of 10 3. STREP EXPOSURE: "Has there been any exposure to strep within the past week?" If so, ask: "What type of contact occurred?"      no 4.  VIRAL SYMPTOMS: "Are there any symptoms of a cold, such as a runny nose, cough, hoarse voice or red eyes?"      Cough ocassionally 5. FEVER: "Do you have a fever?" If so, ask: "What is your temperature, how was it measured, and when did it start?"     no 6. PUS ON THE TONSILS: "Is there pus on the tonsils in the back of your throat?"     no 7. OTHER SYMPTOMS: "Do you have any other symptoms?" (e.g., difficulty breathing, headache, rash)     no 8. PREGNANCY: "Is there any chance you are pregnant?" "When was your last menstrual period?"     n/a  Protocols used: SORE THROAT-A-AH

## 2017-09-11 ENCOUNTER — Other Ambulatory Visit: Payer: Self-pay

## 2017-09-11 ENCOUNTER — Ambulatory Visit (INDEPENDENT_AMBULATORY_CARE_PROVIDER_SITE_OTHER): Payer: Medicare Other | Admitting: Family Medicine

## 2017-09-11 ENCOUNTER — Encounter: Payer: Self-pay | Admitting: Family Medicine

## 2017-09-11 VITALS — BP 104/60 | HR 60 | Temp 98.3°F | Ht 70.0 in | Wt 163.2 lb

## 2017-09-11 DIAGNOSIS — J440 Chronic obstructive pulmonary disease with acute lower respiratory infection: Secondary | ICD-10-CM

## 2017-09-11 DIAGNOSIS — J209 Acute bronchitis, unspecified: Secondary | ICD-10-CM | POA: Diagnosis not present

## 2017-09-11 MED ORDER — PREDNISONE 20 MG PO TABS: 40.0000 mg | ORAL_TABLET | Freq: Every day | ORAL | 0 refills | Status: DC

## 2017-09-11 MED ORDER — DOXYCYCLINE HYCLATE 100 MG PO TABS: 100.0000 mg | ORAL_TABLET | Freq: Two times a day (BID) | ORAL | 0 refills | Status: AC

## 2017-09-11 NOTE — Progress Notes (Signed)
Dr. Frederico Hamman T. Nuriyah Hanline, MD, [facility] [place], [address] Phone: [phone] Fax: [phone]  09/11/2017  Patient: Troy [name], MRN: [id number], DOB: [birth year], 83 y.o.  Primary Physician:  [name], MD   Chief Complaint  Patient presents with  . Sore Throat  . Sinus Drainage  . Cough   Subjective:   Troy [name] is a 83 y.o. very pleasant male patient who presents with the following:  Throat has been really sore and runny nose. Has been feeling bad for 5-6 days. No SOB.  He is very pleasant gentleman who I know well, who his family I know entirely, and he presents with almost a week's worth of congestion, runny nose, as well as cough that is productive of sputum.  He has an almost 70-year past history of smoking, and also does have a history of COPD, but he does not take any maintenance medications.  Smoker - smoking 65 years.  Past Medical History, Surgical History, Social History, Family History, Problem List, Medications, and Allergies have been reviewed and updated if relevant.  Patient Active Problem List   Diagnosis Date Noted  . Left serous otitis media 07/04/2017  . Hypertension 05/22/2017  . Eustachian tube dysfunction, left 04/04/2017  . Dry skin dermatitis 12/02/2016  . Health maintenance examination 10/08/2016  . Advanced care planning/counseling discussion 10/08/2016  . Thyromegaly 10/08/2016  . Anemia 10/08/2016  . Aorto-iliac atherosclerosis ([place]) 08/17/2015  . Bleeding hemorrhoid 06/13/2015  . Memory deficit 06/13/2015  . Orthostatic dizziness 08/01/2014  . Paresthesia of foot, bilateral 08/01/2014  . Mid back pain on right side 03/28/2014  . Chest wall pain 03/12/2013  . Supraventricular tachycardia ([place]) 11/24/2012  . CAD (coronary artery disease) 11/24/2012  . Lumbosacral radiculopathy at L5 12/23/2011  . Thrombocytopenia ([place])   . Barrett's esophagus   . Skin  rash 09/16/2011  . PUD (peptic ulcer disease) 05/16/2011  . Medicare annual wellness visit, subsequent 04/30/2011  . COPD (chronic obstructive pulmonary disease) ([place]) 12/26/2010  . Diabetes mellitus type 2 with complications ([place]) 14/48/1856  . Chronic idiopathic constipation 07/17/2010  . HYPERCHOLESTEROLEMIA 05/13/2009  . Smoker 05/13/2009    Past Medical History:  Diagnosis Date  . Aorto-iliac atherosclerosis ([place]) 08/2015   by xray  . Barrett's esophagus    EGD 2012, rpt 2014, longterm PPI  . Chronic idiopathic constipation 07/17/2010  . Chronic obstructive pulmonary disease (COPD) ([facility])    spirometry with moderate COPD  . Coronary atherosclerosis of artery bypass graft   . Diabetes mellitus type II ~2008  . Gastrointestinal ulcer 04/2011   with hematemesis s/p EGD by [facility]  . GERD (gastroesophageal reflux disease)   . HLD (hyperlipidemia)   . HNP (herniated nucleus pulposus), lumbar 2013   with compression of L5 nerve root and foot drop, also with lumbar DDD s/p surgery  . Hypertrophy of prostate without urinary obstruction and other lower urinary tract symptoms (LUTS)   . Paroxysmal SVT (supraventricular tachycardia) ([place])    a. 3/14 => converted in ED with adenosine to AFib => NSR;  b. Echo 4/14:  Mild LVH, mild FBSH, EF 55-60%, Gr 1 DD  . Prostate nodule ~2009   Left-benign s/p eval Uro WNL ([place])  . Thrombocytopenia ([place])   . Tobacco use disorder     Past Surgical History:  Procedure Laterality Date  . COLONOSCOPY  03/2009   small hemorrhoids, 2 polyps (hyperplastic and adenomatous), rpt 5 yrs (Dr.  Magod Eagle) - pt declined rpt scheduling  . COLONOSCOPY  10/2014   2 hyperplastic polyp, hemmorhoids (Magod)  . CORONARY ARTERY BYPASS GRAFT  1999   Dr. Roxy Manns  . ESOPHAGOGASTRODUODENOSCOPY  05/16/2011   after hematemesis, no lesions found, consistent with barrett's esophagus rpt 2 yrs  . ESOPHAGOGASTRODUODENOSCOPY  06/2013   barrett's, small HH, recheck 2-3 yrs  (Magod)  . HEMORRHOID SURGERY    . LUMBAR LAMINECTOMY/DECOMPRESSION MICRODISCECTOMY  01/31/2012   Procedure: LUMBAR LAMINECTOMY/DECOMPRESSION MICRODISCECTOMY 1 LEVEL;  Surgeon: Winfield Cunas, MD;  Location: [facility];  Service: Neurosurgery;  Laterality: Left;  LEFT Lumbar Four-Five Diskectomy  . NOSE SURGERY    . ROTATOR CUFF REPAIR  04/2010   right (but has bilat) Dr. Latanya Maudlin ([facility])    Social History   Socioeconomic History  . Marital status: Married    Spouse name: Not on file  . Number of children: Not on file  . Years of education: Not on file  . Highest education level: Not on file  Social Needs  . Financial resource strain: Not on file  . Food insecurity - worry: Not on file  . Food insecurity - inability: Not on file  . Transportation needs - medical: Not on file  . Transportation needs - non-medical: Not on file  Occupational History  . Occupation: Retired from [employer]  Tobacco Use  . Smoking status: Current Every Day Smoker    Packs/day: 0.50    Years: 60.00    Pack years: 30.00    Types: Cigarettes    Start date: 09/16/1952  . Smokeless tobacco: Current User    Types: Snuff  . Tobacco comment: < 1 ppd; does dip sometimes  Substance and Sexual Activity  . Alcohol use: Yes    Comment: Seldom  . Drug use: No  . Sexual activity: Not on file  Other Topics Concern  . Not on file  Social History Narrative   Lives alone - wife Troy [name] passed away 12/21/2015; No pets   He is very active   He was in the [employer] for 4 years. He gets meds from the [place]   Activity: walking daily since back surgery   Diet: good water, not much fruits/vegetables    Family History  Problem Relation Age of Onset  . Cancer Father 9       colon    Allergies  Allergen Reactions  . Codeine     REACTION: vomiting  . Cyclobenzaprine Hcl     REACTION: vomiting  . Hydrocodone Nausea Only and Other (See Comments)    "crazy"  . Tramadol Other (See Comments)    woozy  . Valium  Other (See Comments)    sedation    Medication list reviewed and updated in full in [place].  ROS: GEN: Acute illness details above GI: Tolerating PO intake GU: maintaining adequate hydration and urination Pulm: No SOB Interactive and getting along well at home.  Otherwise, ROS is as per the HPI.  Objective:   BP 104/60   Pulse 60   Temp 98.3 F (36.8 C) (Oral)   Ht 5\' 10"  (1.778 m)   Wt 163 lb 4 oz (74 kg)   SpO2 97%   BMI 23.42 kg/m    GEN: A and O x 3. WDWN. NAD.    ENT: Nose clear, ext NML.  No LAD.  No JVD.  TM's clear. Oropharynx clear.  PULM: Normal WOB, no distress. No crackles, scattered wheezes, no rhonchi.  CV: RRR, no M/G/R, No rubs, No JVD.   EXT: warm and well-perfused, No c/c/e. PSYCH: Pleasant and conversant.    Laboratory and Imaging Data:  Assessment and Plan:   Acute bronchitis with COPD ([place])  With mild copd flare Abx, steroids, supp care  Follow-up: No Follow-up on file.  Meds ordered this encounter  Medications  . doxycycline (VIBRA-TABS) 100 MG tablet    Sig: Take 1 tablet (100 mg total) by mouth 2 (two) times daily for 10 days.    Dispense:  20 tablet    Refill:  0  . predniSONE (DELTASONE) 20 MG tablet    Sig: Take 2 tablets (40 mg total) by mouth daily with breakfast.    Dispense:  10 tablet    Refill:  0   Medications Discontinued During This Encounter  Medication Reason  . amoxicillin (AMOXIL) 500 MG capsule Completed Course  . doxycycline (VIBRA-TABS) 100 MG tablet Completed Course  . fluticasone (FLONASE) 50 MCG/ACT nasal spray Completed Course   No orders of the defined types were placed in this encounter.   Signed,  Troy Deed. Zahira Brummond, MD   Allergies as of 09/11/2017      Reactions   Codeine    REACTION: vomiting   Cyclobenzaprine Hcl    REACTION: vomiting   Hydrocodone Nausea Only, Other (See Comments)   "crazy"   Tramadol Other (See Comments)   woozy   Valium Other (See Comments)   sedation        Medication List        Accurate as of 09/11/17 11:28 AM. Always use your most recent med list.          albuterol 108 (90 Base) MCG/ACT inhaler Commonly known as:  PROVENTIL HFA;VENTOLIN HFA Inhale 2 puffs into the lungs every 6 (six) hours as needed for wheezing or shortness of breath.   aspirin 81 MG tablet Take 81 mg by mouth daily.   doxycycline 100 MG tablet Commonly known as:  VIBRA-TABS Take 1 tablet (100 mg total) by mouth 2 (two) times daily for 10 days.   isosorbide mononitrate 30 MG 24 hr tablet Commonly known as:  IMDUR TAKE ONE-HALF TABLET BY  MOUTH DAILY   magnesium hydroxide 400 MG/5ML suspension Commonly known as:  MILK OF MAGNESIA Take 15 mLs by mouth daily as needed for moderate constipation.   metFORMIN 500 MG tablet Commonly known as:  GLUCOPHAGE TAKE 1 TABLET BY MOUTH TWO  TIMES DAILY WITH MEALS   metoprolol tartrate 25 MG tablet Commonly known as:  LOPRESSOR TAKE ONE-HALF TABLET BY  MOUTH TWO TIMES DAILY   nitroGLYCERIN 0.4 MG SL tablet Commonly known as:  NITROSTAT Place 1 tablet (0.4 mg total) under the tongue every 5 (five) minutes as needed. For chest pain-may repeat x3   omeprazole 40 MG capsule Commonly known as:  PRILOSEC TAKE 1 CAPSULE BY MOUTH  DAILY   polyethylene glycol powder powder Commonly known as:  GLYCOLAX/MIRALAX Take 8.5 g by mouth daily.   predniSONE 20 MG tablet Commonly known as:  DELTASONE Take 2 tablets (40 mg total) by mouth daily with breakfast.   simvastatin 40 MG tablet Commonly known as:  ZOCOR TAKE 1 TABLET BY MOUTH AT  BEDTIME   triamcinolone cream 0.1 % Commonly known as:  KENALOG Apply 1 application topically 2 (two) times daily. Apply to AA for no more than 2 weeks at a time.

## 2017-09-29 ENCOUNTER — Other Ambulatory Visit: Payer: Self-pay

## 2017-09-29 ENCOUNTER — Telehealth: Payer: Self-pay | Admitting: Family Medicine

## 2017-09-29 MED ORDER — METFORMIN HCL 500 MG PO TABS: ORAL_TABLET | ORAL | 0 refills | Status: DC

## 2017-09-29 MED ORDER — SIMVASTATIN 40 MG PO TABS: 40.0000 mg | ORAL_TABLET | Freq: Every day | ORAL | 0 refills | Status: DC

## 2017-09-29 MED ORDER — METOPROLOL TARTRATE 25 MG PO TABS: ORAL_TABLET | ORAL | 0 refills | Status: DC

## 2017-09-29 MED ORDER — OMEPRAZOLE 40 MG PO CPDR: 40.0000 mg | DELAYED_RELEASE_CAPSULE | Freq: Every day | ORAL | 0 refills | Status: DC

## 2017-09-29 MED ORDER — ISOSORBIDE MONONITRATE ER 30 MG PO TB24: 15.0000 mg | ORAL_TABLET | Freq: Every day | ORAL | 0 refills | Status: DC

## 2017-09-29 NOTE — Telephone Encounter (Signed)
Copied from [employer]. Topic: Quick Communication - Rx Refill/Question >> Sep 29, 2017  8:02 AM Robina Ade, Helene Kelp D wrote: Medication: omeprazole (PRILOSEC) 40 MG capsule, simvastatin (ZOCOR) 40 MG tablet, metFORMIN (GLUCOPHAGE) 500 MG tablet, metoprolol tartrate (LOPRESSOR) 25 MG tablet, isosorbide mononitrate (IMDUR) 30 MG 24 hr tablet. Has the patient contacted their pharmacy? No (Agent: If no, request that the patient contact the pharmacy for the refill.) Preferred Pharmacy (with phone number or street name): [facility] [place] Agent: Please be advised that RX refills may take up to 3 business days. We ask that you follow-up with your pharmacy.

## 2017-10-15 ENCOUNTER — Ambulatory Visit (INDEPENDENT_AMBULATORY_CARE_PROVIDER_SITE_OTHER): Payer: Medicare Other | Admitting: Family Medicine

## 2017-10-15 ENCOUNTER — Encounter: Payer: Self-pay | Admitting: Family Medicine

## 2017-10-15 VITALS — BP 130/70 | HR 77 | Temp 97.9°F | Ht 70.0 in | Wt 163.0 lb

## 2017-10-15 DIAGNOSIS — K22719 Barrett's esophagus with dysplasia, unspecified: Secondary | ICD-10-CM

## 2017-10-15 DIAGNOSIS — J449 Chronic obstructive pulmonary disease, unspecified: Secondary | ICD-10-CM

## 2017-10-15 DIAGNOSIS — I25119 Atherosclerotic heart disease of native coronary artery with unspecified angina pectoris: Secondary | ICD-10-CM

## 2017-10-15 DIAGNOSIS — K5904 Chronic idiopathic constipation: Secondary | ICD-10-CM | POA: Diagnosis not present

## 2017-10-15 DIAGNOSIS — F17200 Nicotine dependence, unspecified, uncomplicated: Secondary | ICD-10-CM

## 2017-10-15 DIAGNOSIS — E7800 Pure hypercholesterolemia, unspecified: Secondary | ICD-10-CM

## 2017-10-15 DIAGNOSIS — D649 Anemia, unspecified: Secondary | ICD-10-CM | POA: Diagnosis not present

## 2017-10-15 DIAGNOSIS — I700 Atherosclerosis of aorta: Secondary | ICD-10-CM

## 2017-10-15 DIAGNOSIS — E010 Iodine-deficiency related diffuse (endemic) goiter: Secondary | ICD-10-CM | POA: Diagnosis not present

## 2017-10-15 DIAGNOSIS — Z0000 Encounter for general adult medical examination without abnormal findings: Secondary | ICD-10-CM

## 2017-10-15 DIAGNOSIS — I708 Atherosclerosis of other arteries: Secondary | ICD-10-CM

## 2017-10-15 DIAGNOSIS — Z7189 Other specified counseling: Secondary | ICD-10-CM

## 2017-10-15 DIAGNOSIS — I70299 Other atherosclerosis of native arteries of extremities, unspecified extremity: Secondary | ICD-10-CM

## 2017-10-15 DIAGNOSIS — E118 Type 2 diabetes mellitus with unspecified complications: Secondary | ICD-10-CM

## 2017-10-15 DIAGNOSIS — I10 Essential (primary) hypertension: Secondary | ICD-10-CM

## 2017-10-15 LAB — LIPID PANEL
CHOL/HDL RATIO: 4
Cholesterol: 134 mg/dL (ref 0–200)
HDL: 33.4 mg/dL — AB (ref >39.00)
LDL CALC: 73 mg/dL (ref 0–99)
NONHDL: 100.52
Triglycerides: 138 mg/dL (ref 0.0–149.0)
VLDL: 27.6 mg/dL (ref 0.0–40.0)

## 2017-10-15 LAB — CBC WITH DIFFERENTIAL/PLATELET
BASOS ABS: 0 10*3/uL (ref 0.0–0.1)
Basophils Relative: 0.4 % (ref 0.0–3.0)
Eosinophils Absolute: 0 10*3/uL (ref 0.0–0.7)
Eosinophils Relative: 0.7 % (ref 0.0–5.0)
HCT: 38.3 % — ABNORMAL LOW (ref 39.0–52.0)
Hemoglobin: 12.4 g/dL — ABNORMAL LOW (ref 13.0–17.0)
LYMPHS ABS: 2.5 10*3/uL (ref 0.7–4.0)
Lymphocytes Relative: 37.1 % (ref 12.0–46.0)
MCHC: 32.4 g/dL (ref 30.0–36.0)
MCV: 87.7 fl (ref 78.0–100.0)
MONO ABS: 0.7 10*3/uL (ref 0.1–1.0)
MONOS PCT: 10.9 % (ref 3.0–12.0)
NEUTROS ABS: 3.4 10*3/uL (ref 1.4–7.7)
NEUTROS PCT: 50.9 % (ref 43.0–77.0)
PLATELETS: 157 10*3/uL (ref 150.0–400.0)
RBC: 4.37 Mil/uL (ref 4.22–5.81)
RDW: 15.6 % — ABNORMAL HIGH (ref 11.5–15.5)
WBC: 6.6 10*3/uL (ref 4.0–10.5)

## 2017-10-15 LAB — BASIC METABOLIC PANEL
BUN: 24 mg/dL — ABNORMAL HIGH (ref 6–23)
CHLORIDE: 106 meq/L (ref 96–112)
CO2: 27 mEq/L (ref 19–32)
CREATININE: 1.49 mg/dL (ref 0.40–1.50)
Calcium: 10 mg/dL (ref 8.4–10.5)
GFR: 47.96 mL/min — ABNORMAL LOW (ref >60.00)
Glucose, Bld: 106 mg/dL — ABNORMAL HIGH (ref 70–99)
Potassium: 4.7 mEq/L (ref 3.5–5.1)
SODIUM: 142 meq/L (ref 135–145)

## 2017-10-15 LAB — HEMOGLOBIN A1C: HEMOGLOBIN A1C: 6.9 % — AB (ref 4.6–6.5)

## 2017-10-15 MED ORDER — METOPROLOL TARTRATE 25 MG PO TABS: ORAL_TABLET | ORAL | 4 refills | Status: DC

## 2017-10-15 MED ORDER — METFORMIN HCL 500 MG PO TABS: ORAL_TABLET | ORAL | 4 refills | Status: DC

## 2017-10-15 MED ORDER — SIMVASTATIN 40 MG PO TABS: 40.0000 mg | ORAL_TABLET | Freq: Every day | ORAL | 4 refills | Status: DC

## 2017-10-15 MED ORDER — ISOSORBIDE MONONITRATE ER 30 MG PO TB24: 15.0000 mg | ORAL_TABLET | Freq: Every day | ORAL | 4 refills | Status: DC

## 2017-10-15 MED ORDER — OMEPRAZOLE 40 MG PO CPDR: 40.0000 mg | DELAYED_RELEASE_CAPSULE | Freq: Every day | ORAL | 4 refills | Status: DC

## 2017-10-15 NOTE — ACP (Advance Care Planning) (Signed)
Advanced directive discussion - does not have this set up. Wants son and daughter to be HCPOA. Has discussed wishes with them. Does not want CPR, intubation or life support. Thinks he would be ok with temporary BiPAP if needed. Declines packet or DNR form today "The people who need to know know."

## 2017-10-15 NOTE — Assessment & Plan Note (Signed)
Chronic. Stable off medications. H/o recurrent bronchitis in the past, not recently.

## 2017-10-15 NOTE — Assessment & Plan Note (Signed)
Continue aspirin, statin.  

## 2017-10-15 NOTE — Assessment & Plan Note (Signed)
Chronic, stable. Continue current regimen. 

## 2017-10-15 NOTE — Assessment & Plan Note (Signed)
Continue daily PPI.  

## 2017-10-15 NOTE — Assessment & Plan Note (Signed)
S/p cabg. Has not returned to see cards. Continue aspirin, statin, beta blocker, imdur.

## 2017-10-15 NOTE — Assessment & Plan Note (Signed)
Chronic, stable on metformin - continue.  Thinks he received prevnar at pharmacy - but [facility] sheet does not have this - will need next visit.

## 2017-10-15 NOTE — Assessment & Plan Note (Signed)
Not appreciated today.  

## 2017-10-15 NOTE — Patient Instructions (Addendum)
Labs today Medicines refilled today. Return in 1 year, sooner if needed. Good to see you today.  Health Maintenance, Male A healthy lifestyle and preventive care is important for your health and wellness. Ask your health care provider about what schedule of regular examinations is right for you. What should I know about weight and diet? Eat a Healthy Diet  Eat plenty of vegetables, fruits, whole grains, low-fat dairy products, and lean protein.  Do not eat a lot of foods high in solid fats, added sugars, or salt.  Maintain a Healthy Weight Regular exercise can help you achieve or maintain a healthy weight. You should:  Do at least 150 minutes of exercise each week. The exercise should increase your heart rate and make you sweat (moderate-intensity exercise).  Do strength-training exercises at least twice a week.  Watch Your Levels of Cholesterol and Blood Lipids  Have your blood tested for lipids and cholesterol every 5 years starting at 82 years of age. If you are at high risk for heart disease, you should start having your blood tested when you are 82 years old. You may need to have your cholesterol levels checked more often if: ? Your lipid or cholesterol levels are high. ? You are older than 82 years of age. ? You are at high risk for heart disease.  What should I know about cancer screening? Many types of cancers can be detected early and may often be prevented. Lung Cancer  You should be screened every year for lung cancer if: ? You are a current smoker who has smoked for at least 30 years. ? You are a former smoker who has quit within the past 15 years.  Talk to your health care provider about your screening options, when you should start screening, and how often you should be screened.  Colorectal Cancer  Routine colorectal cancer screening usually begins at 82 years of age and should be repeated every 5-10 years until you are 82 years old. You may need to be screened  more often if early forms of precancerous polyps or small growths are found. Your health care provider may recommend screening at an earlier age if you have risk factors for colon cancer.  Your health care provider may recommend using home test kits to check for hidden blood in the stool.  A small camera at the end of a tube can be used to examine your colon (sigmoidoscopy or colonoscopy). This checks for the earliest forms of colorectal cancer.  Prostate and Testicular Cancer  Depending on your age and overall health, your health care provider may do certain tests to screen for prostate and testicular cancer.  Talk to your health care provider about any symptoms or concerns you have about testicular or prostate cancer.  Skin Cancer  Check your skin from head to toe regularly.  Tell your health care provider about any new moles or changes in moles, especially if: ? There is a change in a mole's size, shape, or color. ? You have a mole that is larger than a pencil eraser.  Always use sunscreen. Apply sunscreen liberally and repeat throughout the day.  Protect yourself by wearing long sleeves, pants, a wide-brimmed hat, and sunglasses when outside.  What should I know about heart disease, diabetes, and high blood pressure?  If you are 33-20 years of age, have your blood pressure checked every 3-5 years. If you are 38 years of age or older, have your blood pressure checked every year. You  should have your blood pressure measured twice-once when you are at a hospital or clinic, and once when you are not at a hospital or clinic. Record the average of the two measurements. To check your blood pressure when you are not at a hospital or clinic, you can use: ? An automated blood pressure machine at a pharmacy. ? A home blood pressure monitor.  Talk to your health care provider about your target blood pressure.  If you are between 84-11 years old, ask your health care provider if you should  take aspirin to prevent heart disease.  Have regular diabetes screenings by checking your fasting blood sugar level. ? If you are at a normal weight and have a low risk for diabetes, have this test once every three years after the age of 51. ? If you are overweight and have a high risk for diabetes, consider being tested at a younger age or more often.  A one-time screening for abdominal aortic aneurysm (AAA) by ultrasound is recommended for men aged 2-75 years who are current or former smokers. What should I know about preventing infection? Hepatitis B If you have a higher risk for hepatitis B, you should be screened for this virus. Talk with your health care provider to find out if you are at risk for hepatitis B infection. Hepatitis C Blood testing is recommended for:  Everyone born from [birth year] through 1963.  Anyone with known risk factors for hepatitis C.  Sexually Transmitted Diseases (STDs)  You should be screened each year for STDs including gonorrhea and chlamydia if: ? You are sexually active and are younger than 82 years of age. ? You are older than 82 years of age and your health care provider tells you that you are at risk for this type of infection. ? Your sexual activity has changed since you were last screened and you are at an increased risk for chlamydia or gonorrhea. Ask your health care provider if you are at risk.  Talk with your health care provider about whether you are at high risk of being infected with HIV. Your health care provider may recommend a prescription medicine to help prevent HIV infection.  What else can I do?  Schedule regular health, dental, and eye exams.  Stay current with your vaccines (immunizations).  Do not use any tobacco products, such as cigarettes, chewing tobacco, and e-cigarettes. If you need help quitting, ask your health care provider.  Limit alcohol intake to no more than 2 drinks per day. One drink equals 12 ounces of beer, 5  ounces of wine, or 1 ounces of hard liquor.  Do not use street drugs.  Do not share needles.  Ask your health care provider for help if you need support or information about quitting drugs.  Tell your health care provider if you often feel depressed.  Tell your health care provider if you have ever been abused or do not feel safe at home. This information is not intended to replace advice given to you by your health care provider. Make sure you discuss any questions you have with your health care provider. Document Released: 02/29/2008 Document Revised: 05/01/2016 Document Reviewed: 06/06/2015 Elsevier Interactive Patient Education  [employer].

## 2017-10-15 NOTE — Assessment & Plan Note (Signed)
Advanced directive discussion - does not have this set up. Wants son and daughter to be HCPOA. Has discussed wishes with them. Does not want CPR, intubation or life support. Thinks he would be ok with temporary BiPAP if needed. Declines packet or DNR form today "The people who need to know know."

## 2017-10-15 NOTE — Progress Notes (Addendum)
BP 130/70 (BP Location: Left Arm, Patient Position: Sitting, Cuff Size: Normal)   Pulse 77   Temp 97.9 F (36.6 C) (Oral)   Ht 5\' 10"  (1.778 m)   Wt 163 lb (73.9 kg)   SpO2 97%   BMI 23.39 kg/m    CC: AMW Subjective:    Patient ID: Troy [name], male    DOB: [birth year], 84 y.o.   MRN: [id number]  HPI: Troy [name] is a 84 y.o. male presenting on 10/15/2017 for Medicare Wellness    Hearing Screening   125Hz  250Hz  500Hz  1000Hz  2000Hz  3000Hz  4000Hz  6000Hz  8000Hz   Right ear:   0 0 0  0    Left ear:   0 0 0  0    Comments: Only heard practice tone in bilateral ears on 40dBJL.   Visual Acuity Screening   Right eye Left eye Both eyes  Without correction: 20/25 20/30/ 20/25  With correction:      Did not have AMW done, declines visit with our health advisor.  Chronic constipation managed with MOM - has regular soft stool every 3-4 days Denies trouble with urinary retention, urgency or incontinence.   Preventative: COLONOSCOPY 10/2014; 2 hyperplastic polyp, hemmorhoids ([place]) Prostate cancer screening - aged out Lung cancer screening - not eligible due to age Flu shot - yearly Pneumovax 12-24-2011, thinks got prevnar at [facility] 12/23/2009 shingrix - declines Advanced directive discussion - does not have this set up. Wants son and daughter to be HCPOA. Has discussed wishes with them. Does not want CPR, intubation or life support. Thinks he would be ok with temporary BiPAP if needed. Declines packet or DNR form today "The people who need to know know." Seat belt use discussed Sunscreen use discussed. No changing moles on skin. Smoking <1 ppd/day. Sees dentist.  Alcohol - none  Widower - wife passed away early 2016-12-23. Son lives nearby [place] brings meals daily  Daughter lives in [place] He is very active He was in the [employer] for 4 years.  Activity: walking daily since back surgery  Diet: good water, not much fruits/vegetables   Relevant past medical,  surgical, family and social history reviewed and updated as indicated. Interim medical history since our last visit reviewed. Allergies and medications reviewed and updated. Outpatient Medications Prior to Visit  Medication Sig Dispense Refill  . albuterol (PROVENTIL HFA;VENTOLIN HFA) 108 (90 Base) MCG/ACT inhaler Inhale 2 puffs into the lungs every 6 (six) hours as needed for wheezing or shortness of breath. 1 Inhaler 3  . aspirin 81 MG tablet Take 81 mg by mouth daily.     . magnesium hydroxide (MILK OF MAGNESIA) 400 MG/5ML suspension Take 15 mLs by mouth daily as needed for moderate constipation. 360 mL 0  . nitroGLYCERIN (NITROSTAT) 0.4 MG SL tablet Place 1 tablet (0.4 mg total) under the tongue every 5 (five) minutes as needed. For chest pain-may repeat x3 30 tablet 3  . polyethylene glycol powder (GLYCOLAX/MIRALAX) powder Take 8.5 g by mouth daily. 3350 g 1  . triamcinolone cream (KENALOG) 0.1 % Apply 1 application topically 2 (two) times daily. Apply to AA for no more than 2 weeks at a time. 453.6 g 0  . isosorbide mononitrate (IMDUR) 30 MG 24 hr tablet Take 0.5 tablets (15 mg total) by mouth daily. 45 tablet 0  . metFORMIN (GLUCOPHAGE) 500 MG tablet TAKE 1 TABLET BY MOUTH TWO  TIMES DAILY WITH MEALS 60 tablet 0  . metoprolol tartrate (LOPRESSOR)  25 MG tablet TAKE ONE-HALF TABLET BY  MOUTH TWO TIMES DAILY 90 tablet 0  . omeprazole (PRILOSEC) 40 MG capsule Take 1 capsule (40 mg total) by mouth daily. 30 capsule 0  . predniSONE (DELTASONE) 20 MG tablet Take 2 tablets (40 mg total) by mouth daily with breakfast. 10 tablet 0  . simvastatin (ZOCOR) 40 MG tablet Take 1 tablet (40 mg total) by mouth at bedtime. 30 tablet 0   No facility-administered medications prior to visit.      Per HPI unless specifically indicated in ROS section below Review of Systems  Constitutional: Negative for activity change, appetite change, chills, fatigue, fever and unexpected weight change.  HENT: Negative for  hearing loss.   Eyes: Negative for visual disturbance.  Respiratory: Positive for cough (smoker). Negative for chest tightness, shortness of breath and wheezing.   Cardiovascular: Negative for chest pain, palpitations and leg swelling.  Gastrointestinal: Negative for abdominal distention, abdominal pain, blood in stool, constipation, diarrhea, nausea and vomiting.  Genitourinary: Negative for difficulty urinating and hematuria.  Musculoskeletal: Negative for arthralgias, myalgias and neck pain.  Skin: Negative for rash.  Neurological: Negative for dizziness, seizures, syncope and headaches.  Hematological: Negative for adenopathy. Does not bruise/bleed easily.  Psychiatric/Behavioral: Negative for dysphoric mood. The patient is not nervous/anxious.        Objective:    BP 130/70 (BP Location: Left Arm, Patient Position: Sitting, Cuff Size: Normal)   Pulse 77   Temp 97.9 F (36.6 C) (Oral)   Ht 5\' 10"  (1.778 m)   Wt 163 lb (73.9 kg)   SpO2 97%   BMI 23.39 kg/m   Wt Readings from Last 3 Encounters:  10/15/17 163 lb (73.9 kg)  09/11/17 163 lb 4 oz (74 kg)  08/14/17 162 lb 4 oz (73.6 kg)    Physical Exam  Constitutional: He is oriented to person, place, and time. He appears well-developed and well-nourished. No distress.  HENT:  Head: Normocephalic and atraumatic.  Right Ear: Hearing, tympanic membrane, external ear and ear canal normal.  Left Ear: Hearing, tympanic membrane, external ear and ear canal normal.  Nose: Nose normal.  Mouth/Throat: Uvula is midline, oropharynx is clear and moist and mucous membranes are normal. No oropharyngeal exudate, posterior oropharyngeal edema or posterior oropharyngeal erythema.  Eyes: Conjunctivae and EOM are normal. Pupils are equal, round, and reactive to light. No scleral icterus.  Neck: Normal range of motion. Neck supple. No thyromegaly present.  Cardiovascular: Normal rate, regular rhythm, normal heart sounds and intact distal pulses.    No murmur heard. Pulses:      Radial pulses are 2+ on the right side, and 2+ on the left side.  Pulmonary/Chest: Effort normal. No respiratory distress. He has no decreased breath sounds. He has no wheezes. He has no rhonchi. He has no rales.  Coarse breath sounds  Abdominal: Soft. Bowel sounds are normal. He exhibits no distension and no mass. There is no tenderness. There is no rebound and no guarding.  Musculoskeletal: Normal range of motion. He exhibits no edema.  Lymphadenopathy:    He has no cervical adenopathy.  Neurological: He is alert and oriented to person, place, and time.  CN grossly intact, station and gait intact  Skin: Skin is warm and dry. No rash noted.  Psychiatric: He has a normal mood and affect. His behavior is normal. Judgment and thought content normal.  Nursing note and vitals reviewed.  Results for orders placed or performed in visit on 10/01/16  Lipid panel  Result Value Ref Range   Cholesterol 136 0 - 200 mg/dL   Triglycerides 104.0 0.0 - 149.0 mg/dL   HDL 34.20 (L) >39.00 mg/dL   VLDL 20.8 0.0 - 40.0 mg/dL   LDL Cholesterol 81 0 - 99 mg/dL   Total CHOL/HDL Ratio 4    NonHDL 101.69   Comprehensive metabolic panel  Result Value Ref Range   Sodium 138 135 - 145 mEq/L   Potassium 4.8 3.5 - 5.1 mEq/L   Chloride 104 96 - 112 mEq/L   CO2 27 19 - 32 mEq/L   Glucose, Bld 100 (H) 70 - 99 mg/dL   BUN 20 6 - 23 mg/dL   Creatinine, Ser 1.19 0.40 - 1.50 mg/dL   Total Bilirubin 0.4 0.2 - 1.2 mg/dL   Alkaline Phosphatase 109 39 - 117 U/L   AST 16 0 - 37 U/L   ALT 16 0 - 53 U/L   Total Protein 7.2 6.0 - 8.3 g/dL   Albumin 4.2 3.5 - 5.2 g/dL   Calcium 9.6 8.4 - 10.5 mg/dL   GFR 62.33 >60.00 mL/min  Hemoglobin A1c  Result Value Ref Range   Hgb A1c MFr Bld 6.7 (H) 4.6 - 6.5 %  CBC with Differential/Platelet  Result Value Ref Range   WBC 6.4 4.0 - 10.5 K/uL   RBC 4.19 (L) 4.22 - 5.81 Mil/uL   Hemoglobin 11.8 (L) 13.0 - 17.0 g/dL   HCT 35.6 (L) 39.0 - 52.0 %    MCV 85.0 78.0 - 100.0 fl   MCHC 33.2 30.0 - 36.0 g/dL   RDW 15.7 (H) 11.5 - 15.5 %   Platelets 153.0 150.0 - 400.0 K/uL   Neutrophils Relative % 54.6 43.0 - 77.0 %   Lymphocytes Relative 35.2 12.0 - 46.0 %   Monocytes Relative 8.2 3.0 - 12.0 %   Eosinophils Relative 1.6 0.0 - 5.0 %   Basophils Relative 0.4 0.0 - 3.0 %   Neutro Abs 3.5 1.4 - 7.7 K/uL   Lymphs Abs 2.2 0.7 - 4.0 K/uL   Monocytes Absolute 0.5 0.1 - 1.0 K/uL   Eosinophils Absolute 0.1 0.0 - 0.7 K/uL   Basophils Absolute 0.0 0.0 - 0.1 K/uL  TSH  Result Value Ref Range   TSH 3.03 0.35 - 4.50 uIU/mL  Microalbumin / creatinine urine ratio  Result Value Ref Range   Microalb, Ur 3.1 (H) 0.0 - 1.9 mg/dL   Creatinine,U 66.8 mg/dL   Microalb Creat Ratio 4.6 0.0 - 30.0 mg/g      Assessment & Plan:   Problem List Items Addressed This Visit    Advanced care planning/counseling discussion    Advanced directive discussion - does not have this set up. Wants son and daughter to be HCPOA. Has discussed wishes with them. Does not want CPR, intubation or life support. Thinks he would be ok with temporary BiPAP if needed. Declines packet or DNR form today "The people who need to know know."      Anemia   Relevant Orders   CBC with Differential/Platelet   Aorto-iliac atherosclerosis ([place])    Continue aspirin, statin.      Relevant Medications   isosorbide mononitrate (IMDUR) 30 MG 24 hr tablet   metoprolol tartrate (LOPRESSOR) 25 MG tablet   simvastatin (ZOCOR) 40 MG tablet   Barrett's esophagus    Continue daily PPI.      CAD (coronary artery disease)    S/p cabg. Has not returned to see cards. Continue  aspirin, statin, beta blocker, imdur.      Relevant Medications   isosorbide mononitrate (IMDUR) 30 MG 24 hr tablet   metoprolol tartrate (LOPRESSOR) 25 MG tablet   simvastatin (ZOCOR) 40 MG tablet   Chronic idiopathic constipation    Not taking miralax - taking MOM daily with resultant 1 soft BM every 3 days.  Satisfied with this.       COPD (chronic obstructive pulmonary disease) (HCC)    Chronic. Stable off medications. H/o recurrent bronchitis in the past, not recently.       Diabetes mellitus type 2 with complications (HCC)    Chronic, stable on metformin - continue.  Thinks he received prevnar at pharmacy - but [facility] sheet does not have this - will need next visit.       Relevant Medications   metFORMIN (GLUCOPHAGE) 500 MG tablet   simvastatin (ZOCOR) 40 MG tablet   Other Relevant Orders   Hemoglobin F8B   Basic metabolic panel   Health maintenance examination - Primary    Preventative protocols reviewed and updated unless pt declined. Discussed healthy diet and lifestyle.       HYPERCHOLESTEROLEMIA    Chronic, stable. Not fasting today. Continue simvastatin, update FLP. The ASCVD Risk score Mikey Bussing DC Jr., et al., 2013) failed to calculate for the following reasons:   The 2013 ASCVD risk score is only valid for ages 37 to 58       Relevant Medications   isosorbide mononitrate (IMDUR) 30 MG 24 hr tablet   metoprolol tartrate (LOPRESSOR) 25 MG tablet   simvastatin (ZOCOR) 40 MG tablet   Other Relevant Orders   Lipid panel   Hypertension    Chronic, stable. Continue current regimen.       Relevant Medications   isosorbide mononitrate (IMDUR) 30 MG 24 hr tablet   metoprolol tartrate (LOPRESSOR) 25 MG tablet   simvastatin (ZOCOR) 40 MG tablet   Smoker    Precontemplative. Not likely to quit.       Thyromegaly    Not appreciated today.       Relevant Medications   metoprolol tartrate (LOPRESSOR) 25 MG tablet       Follow up plan: Return in about 1 year (around 10/15/2018) for annual exam, prior fasting for blood work.  [name], MD

## 2017-10-15 NOTE — Assessment & Plan Note (Signed)
Chronic, stable. Not fasting today. Continue simvastatin, update FLP. The ASCVD Risk score Mikey Bussing DC Jr., et al., 2013) failed to calculate for the following reasons:   The 2013 ASCVD risk score is only valid for ages 33 to 42

## 2017-10-15 NOTE — Assessment & Plan Note (Signed)
Precontemplative. Not likely to quit.

## 2017-10-15 NOTE — Assessment & Plan Note (Signed)
Not taking miralax - taking MOM daily with resultant 1 soft BM every 3 days. Satisfied with this.

## 2017-10-15 NOTE — Assessment & Plan Note (Signed)
Preventative protocols reviewed and updated unless pt declined. Discussed healthy diet and lifestyle.  

## 2017-10-27 ENCOUNTER — Ambulatory Visit (INDEPENDENT_AMBULATORY_CARE_PROVIDER_SITE_OTHER): Payer: Medicare Other | Admitting: Family Medicine

## 2017-10-27 ENCOUNTER — Encounter: Payer: Self-pay | Admitting: Family Medicine

## 2017-10-27 VITALS — BP 122/60 | HR 74 | Temp 97.9°F | Wt 161.5 lb

## 2017-10-27 DIAGNOSIS — F17200 Nicotine dependence, unspecified, uncomplicated: Secondary | ICD-10-CM

## 2017-10-27 DIAGNOSIS — J019 Acute sinusitis, unspecified: Secondary | ICD-10-CM

## 2017-10-27 DIAGNOSIS — E118 Type 2 diabetes mellitus with unspecified complications: Secondary | ICD-10-CM

## 2017-10-27 MED ORDER — FLUTICASONE PROPIONATE 50 MCG/ACT NA SUSP: 2.0000 | Freq: Every day | NASAL | 1 refills | Status: DC

## 2017-10-27 NOTE — Assessment & Plan Note (Signed)
Anticipate viral given short duration. Supportive care reviewed, update if not improving with treatment to consider abx course.  Discussed increased risk of bacterial infection and symptoms to watch for.

## 2017-10-27 NOTE — Progress Notes (Signed)
BP 122/60 (BP Location: Left Arm, Patient Position: Sitting, Cuff Size: Normal)   Pulse 74   Temp 97.9 F (36.6 C) (Oral)   Wt 161 lb 8 oz (73.3 kg)   SpO2 97%   BMI 23.17 kg/m    CC: ST Subjective:    Patient ID: Troy [name], male    DOB: [birth year], 84 y.o.   MRN: [id number]  HPI: Troy [name] is a 84 y.o. male presenting on 10/27/2017 for Sore Throat (Started 2 days ago. Also, c/o HA, runny nose and facial pressure. Deines fever N/V/D. Has not taken anything.)   2d h/o ST, facial pressure, headache, runny nose and mild cough.   No fevers/chills, abd pain, nausea/vomiting, diarrhea. No dyspnea or wheezing.  No sick contacts at home.  Smoker.  Saw ENT 07/2017 for ear trouble - now better.   Relevant past medical, surgical, family and social history reviewed and updated as indicated. Interim medical history since our last visit reviewed. Allergies and medications reviewed and updated. Outpatient Medications Prior to Visit  Medication Sig Dispense Refill  . albuterol (PROVENTIL HFA;VENTOLIN HFA) 108 (90 Base) MCG/ACT inhaler Inhale 2 puffs into the lungs every 6 (six) hours as needed for wheezing or shortness of breath. 1 Inhaler 3  . aspirin 81 MG tablet Take 81 mg by mouth daily.     . isosorbide mononitrate (IMDUR) 30 MG 24 hr tablet Take 0.5 tablets (15 mg total) by mouth daily. 45 tablet 4  . magnesium hydroxide (MILK OF MAGNESIA) 400 MG/5ML suspension Take 15 mLs by mouth daily as needed for moderate constipation. 360 mL 0  . metFORMIN (GLUCOPHAGE) 500 MG tablet TAKE 1 TABLET BY MOUTH TWO  TIMES DAILY WITH MEALS 180 tablet 4  . metoprolol tartrate (LOPRESSOR) 25 MG tablet TAKE ONE-HALF TABLET BY  MOUTH TWO TIMES DAILY 90 tablet 4  . nitroGLYCERIN (NITROSTAT) 0.4 MG SL tablet Place 1 tablet (0.4 mg total) under the tongue every 5 (five) minutes as needed. For chest pain-may repeat x3 30 tablet 3  . omeprazole (PRILOSEC) 40 MG capsule Take 1 capsule (40 mg total) by  mouth daily. 90 capsule 4  . polyethylene glycol powder (GLYCOLAX/MIRALAX) powder Take 8.5 g by mouth daily. 3350 g 1  . simvastatin (ZOCOR) 40 MG tablet Take 1 tablet (40 mg total) by mouth at bedtime. 90 tablet 4  . triamcinolone cream (KENALOG) 0.1 % Apply 1 application topically 2 (two) times daily. Apply to AA for no more than 2 weeks at a time. 453.6 g 0   No facility-administered medications prior to visit.      Per HPI unless specifically indicated in ROS section below Review of Systems     Objective:    BP 122/60 (BP Location: Left Arm, Patient Position: Sitting, Cuff Size: Normal)   Pulse 74   Temp 97.9 F (36.6 C) (Oral)   Wt 161 lb 8 oz (73.3 kg)   SpO2 97%   BMI 23.17 kg/m   Wt Readings from Last 3 Encounters:  10/27/17 161 lb 8 oz (73.3 kg)  10/15/17 163 lb (73.9 kg)  09/11/17 163 lb 4 oz (74 kg)    Physical Exam  Constitutional: He appears well-developed and well-nourished. No distress.  HENT:  Head: Normocephalic and atraumatic.  Right Ear: Hearing, external ear and ear canal normal.  Left Ear: Hearing, external ear and ear canal normal.  Nose: Mucosal edema (marked, with pallor) and rhinorrhea present. Right sinus exhibits maxillary sinus tenderness and  frontal sinus tenderness. Left sinus exhibits maxillary sinus tenderness and frontal sinus tenderness.  Mouth/Throat: Uvula is midline and mucous membranes are normal. Posterior oropharyngeal erythema (mild) present. No oropharyngeal exudate, posterior oropharyngeal edema or tonsillar abscesses.  Tortuous L external canal Congestion behind R TM  Eyes: Conjunctivae and EOM are normal. Pupils are equal, round, and reactive to light. No scleral icterus.  Neck: Normal range of motion. Neck supple. No thyromegaly present.  Cardiovascular: Normal rate, regular rhythm, normal heart sounds and intact distal pulses.  No murmur heard. Pulmonary/Chest: Effort normal and breath sounds normal. No respiratory distress. He  has no wheezes. He has no rales.  Lungs largely clear today  Lymphadenopathy:    He has no cervical adenopathy.  Skin: Skin is warm and dry. No rash noted.  Nursing note and vitals reviewed.     Assessment & Plan:   Problem List Items Addressed This Visit    Acute sinusitis - Primary    Anticipate viral given short duration. Supportive care reviewed, update if not improving with treatment to consider abx course.  Discussed increased risk of bacterial infection and symptoms to watch for.       Relevant Medications   fluticasone (FLONASE) 50 MCG/ACT nasal spray   Diabetes mellitus type 2 with complications ([place])   Smoker       Follow up plan: Return if symptoms worsen or fail to improve.  [name], MD

## 2017-10-27 NOTE — Patient Instructions (Signed)
You have a sinus infection, likely viral. Take medicine as prescribed: flonase nasal steroid.  Push fluids and plenty of rest.  Nasal saline irrigation or neti pot to help drain sinuses. May use plain mucinex with plenty of fluid to help mobilize mucous. Please let us know if fever >101.5, worsening productive cough with shortness of breath or wheezing, or if ongoing head congestion past 7-10 days, call me to let me know.

## 2017-11-04 ENCOUNTER — Encounter: Payer: Self-pay | Admitting: Family Medicine

## 2017-11-04 ENCOUNTER — Ambulatory Visit (INDEPENDENT_AMBULATORY_CARE_PROVIDER_SITE_OTHER): Payer: Medicare Other | Admitting: Family Medicine

## 2017-11-04 VITALS — BP 122/70 | HR 77 | Temp 97.8°F | Wt 153.0 lb

## 2017-11-04 DIAGNOSIS — J019 Acute sinusitis, unspecified: Secondary | ICD-10-CM

## 2017-11-04 DIAGNOSIS — J449 Chronic obstructive pulmonary disease, unspecified: Secondary | ICD-10-CM | POA: Diagnosis not present

## 2017-11-04 DIAGNOSIS — E118 Type 2 diabetes mellitus with unspecified complications: Secondary | ICD-10-CM

## 2017-11-04 DIAGNOSIS — K5904 Chronic idiopathic constipation: Secondary | ICD-10-CM | POA: Diagnosis not present

## 2017-11-04 MED ORDER — PREDNISONE 20 MG PO TABS: ORAL_TABLET | ORAL | 0 refills | Status: DC

## 2017-11-04 MED ORDER — LEVOFLOXACIN 250 MG PO TABS: 250.0000 mg | ORAL_TABLET | Freq: Every day | ORAL | 0 refills | Status: DC

## 2017-11-04 MED ORDER — ALBUTEROL SULFATE HFA 108 (90 BASE) MCG/ACT IN AERS: 2.0000 | INHALATION_SPRAY | Freq: Four times a day (QID) | RESPIRATORY_TRACT | 3 refills | Status: DC | PRN

## 2017-11-04 NOTE — Progress Notes (Signed)
BP 122/70 (BP Location: Left Arm, Patient Position: Sitting, Cuff Size: Normal)   Pulse 77   Temp 97.8 F (36.6 C) (Oral)   Wt 153 lb (69.4 kg)   SpO2 96%   BMI 21.95 kg/m    CC: f/u ongoing sinusitis, constipation Subjective:    Patient ID: Troy [name], male    DOB: 02-11-1933, 84 y.o.   MRN: [id number]  HPI: Troy [name] is a 84 y.o. male presenting on 11/04/2017 for URI (Seen on 10/27/17 for URI sxs, not improving. C/o head pressure/pain.)   See prior note for details. Ongoing ST, facial pressure, productive cough worse at night time. No appetite. Feels feverish.  Stays dyspneic.  No chest pain or tightness. No ear pain.   Ongoing constipation without abd pain, bloating or sense of fullness.  He has not been using albuterol inhaler.   Relevant past medical, surgical, family and social history reviewed and updated as indicated. Interim medical history since our last visit reviewed. Allergies and medications reviewed and updated. Outpatient Medications Prior to Visit  Medication Sig Dispense Refill  . aspirin 81 MG tablet Take 81 mg by mouth daily.     . fluticasone (FLONASE) 50 MCG/ACT nasal spray Place 2 sprays into both nostrils daily. 16 g 1  . isosorbide mononitrate (IMDUR) 30 MG 24 hr tablet Take 0.5 tablets (15 mg total) by mouth daily. 45 tablet 4  . magnesium hydroxide (MILK OF MAGNESIA) 400 MG/5ML suspension Take 15 mLs by mouth daily as needed for moderate constipation. 360 mL 0  . metFORMIN (GLUCOPHAGE) 500 MG tablet TAKE 1 TABLET BY MOUTH TWO  TIMES DAILY WITH MEALS 180 tablet 4  . metoprolol tartrate (LOPRESSOR) 25 MG tablet TAKE ONE-HALF TABLET BY  MOUTH TWO TIMES DAILY 90 tablet 4  . nitroGLYCERIN (NITROSTAT) 0.4 MG SL tablet Place 1 tablet (0.4 mg total) under the tongue every 5 (five) minutes as needed. For chest pain-may repeat x3 30 tablet 3  . omeprazole (PRILOSEC) 40 MG capsule Take 1 capsule (40 mg total) by mouth daily. 90 capsule 4  .  polyethylene glycol powder (GLYCOLAX/MIRALAX) powder Take 8.5 g by mouth daily. 3350 g 1  . simvastatin (ZOCOR) 40 MG tablet Take 1 tablet (40 mg total) by mouth at bedtime. 90 tablet 4  . triamcinolone cream (KENALOG) 0.1 % Apply 1 application topically 2 (two) times daily. Apply to AA for no more than 2 weeks at a time. 453.6 g 0  . albuterol (PROVENTIL HFA;VENTOLIN HFA) 108 (90 Base) MCG/ACT inhaler Inhale 2 puffs into the lungs every 6 (six) hours as needed for wheezing or shortness of breath. 1 Inhaler 3   No facility-administered medications prior to visit.      Per HPI unless specifically indicated in ROS section below Review of Systems     Objective:    BP 122/70 (BP Location: Left Arm, Patient Position: Sitting, Cuff Size: Normal)   Pulse 77   Temp 97.8 F (36.6 C) (Oral)   Wt 153 lb (69.4 kg)   SpO2 96%   BMI 21.95 kg/m   Wt Readings from Last 3 Encounters:  11/04/17 153 lb (69.4 kg)  10/27/17 161 lb 8 oz (73.3 kg)  10/15/17 163 lb (73.9 kg)    Physical Exam  Constitutional: He appears well-developed and well-nourished. No distress.  HENT:  Head: Normocephalic and atraumatic.  Right Ear: Hearing, tympanic membrane, external ear and ear canal normal.  Left Ear: Hearing, tympanic membrane, external ear and  ear canal normal.  Nose: Mucosal edema present. No rhinorrhea. Right sinus exhibits no maxillary sinus tenderness and no frontal sinus tenderness. Left sinus exhibits no maxillary sinus tenderness and no frontal sinus tenderness.  Mouth/Throat: Uvula is midline, oropharynx is clear and moist and mucous membranes are normal. No oropharyngeal exudate, posterior oropharyngeal edema, posterior oropharyngeal erythema or tonsillar abscesses.  Dry nasal mucosa, congestion throughout  Eyes: Conjunctivae and EOM are normal. Pupils are equal, round, and reactive to light. No scleral icterus.  Neck: Normal range of motion. Neck supple.  Cardiovascular: Normal rate, regular  rhythm, normal heart sounds and intact distal pulses.  No murmur heard. Pulmonary/Chest: Effort normal and breath sounds normal. No respiratory distress. He has no wheezes. He has no rales.  Lungs somewhat tight but no wheezing or rales appreciated  Abdominal: Soft. Bowel sounds are normal. He exhibits no distension and no mass. There is no tenderness. There is no rebound and no guarding.  Lymphadenopathy:    He has no cervical adenopathy.  Skin: Skin is warm and dry. No rash noted.  Nursing note and vitals reviewed.  Lab Results  Component Value Date   HGBA1C 6.9 (H) 10/15/2017    Lab Results  Component Value Date   CREATININE 1.49 10/15/2017       Assessment & Plan:   Problem List Items Addressed This Visit    Acute sinusitis - Primary    Pt frustrated with ongoing symptoms for last 10 days - he did recently have doxy courses x2 over the last 2 months. Will treat for bacterial sinusitis with levaquin 250mg  daily x 7 day course, prednisone, and refill albuterol inhaler.       Relevant Medications   predniSONE (DELTASONE) 20 MG tablet   levofloxacin (LEVAQUIN) 250 MG tablet   Chronic idiopathic constipation    Chronic issue, acute worsening.  Not consistent with obstruction or ileus - anticipate decreased bowel movements more related to poor PO intake over the past week from sinusitis/malaise, with noted weight loss as well. Advised he start monitoring weights more regularly and let me know if ongoing. Advised to increase water intake to help combat dehydration and constipation.       COPD (chronic obstructive pulmonary disease) (HCC)    Not consistent with acute COPD exacerbation at this time. Will refill albuterol inhaler - he states he no longer has this at home.       Relevant Medications   predniSONE (DELTASONE) 20 MG tablet   albuterol (PROVENTIL HFA;VENTOLIN HFA) 108 (90 Base) MCG/ACT inhaler   Diabetes mellitus type 2 with complications (HCC)    Monitor sugars while  on prednisone.           Meds ordered this encounter  Medications  . predniSONE (DELTASONE) 20 MG tablet    Sig: Take two tablets daily for 3 days followed by one tablet daily for 4 days    Dispense:  10 tablet    Refill:  0  . levofloxacin (LEVAQUIN) 250 MG tablet    Sig: Take 1 tablet (250 mg total) by mouth daily.    Dispense:  7 tablet    Refill:  0  . albuterol (PROVENTIL HFA;VENTOLIN HFA) 108 (90 Base) MCG/ACT inhaler    Sig: Inhale 2 puffs into the lungs every 6 (six) hours as needed for wheezing or shortness of breath.    Dispense:  1 Inhaler    Refill:  3    Or generic equivalent   No orders of the  defined types were placed in this encounter.   Follow up plan: No Follow-up on file.  [name], MD

## 2017-11-04 NOTE — Assessment & Plan Note (Addendum)
Chronic issue, acute worsening.  Not consistent with obstruction or ileus - anticipate decreased bowel movements more related to poor PO intake over the past week from sinusitis/malaise, with noted weight loss as well. Advised he start monitoring weights more regularly and let me know if ongoing. Advised to increase water intake to help combat dehydration and constipation.

## 2017-11-04 NOTE — Patient Instructions (Addendum)
Weight is down - start watching this and let me know if persists.  For sinus infection - treat with prednisone and levaquin course.  As appetite improves, constipation should improve.  May continue milk of magnesium as needed for constipation.  DRINK MORE WATER.  Update me over next 3 days with how you're feeling.

## 2017-11-04 NOTE — Assessment & Plan Note (Signed)
Pt frustrated with ongoing symptoms for last 10 days - he did recently have doxy courses x2 over the last 2 months. Will treat for bacterial sinusitis with levaquin 250mg  daily x 7 day course, prednisone, and refill albuterol inhaler.

## 2017-11-04 NOTE — Assessment & Plan Note (Signed)
Monitor sugars while on prednisone.

## 2017-11-04 NOTE — Assessment & Plan Note (Signed)
Not consistent with acute COPD exacerbation at this time. Will refill albuterol inhaler - he states he no longer has this at home.

## 2017-12-12 DIAGNOSIS — H9122 Sudden idiopathic hearing loss, left ear: Secondary | ICD-10-CM | POA: Diagnosis not present

## 2017-12-12 DIAGNOSIS — H6123 Impacted cerumen, bilateral: Secondary | ICD-10-CM | POA: Diagnosis not present

## 2017-12-19 ENCOUNTER — Ambulatory Visit (INDEPENDENT_AMBULATORY_CARE_PROVIDER_SITE_OTHER): Payer: Medicare Other | Admitting: Family Medicine

## 2017-12-19 ENCOUNTER — Encounter: Payer: Self-pay | Admitting: Family Medicine

## 2017-12-19 ENCOUNTER — Ambulatory Visit: Payer: Medicare Other | Admitting: Family Medicine

## 2017-12-19 VITALS — BP 144/52 | HR 65 | Temp 97.5°F | Wt 160.8 lb

## 2017-12-19 DIAGNOSIS — B372 Candidiasis of skin and nail: Secondary | ICD-10-CM | POA: Diagnosis not present

## 2017-12-19 MED ORDER — FLUCONAZOLE 150 MG PO TABS: ORAL_TABLET | ORAL | 0 refills | Status: DC

## 2017-12-19 NOTE — Progress Notes (Signed)
Subjective:    Patient ID: Troy [name], male    DOB: September 17, 1932, 83 y.o.   MRN: [id number]  HPI Troy [name] is a 84 y.o. male who presents today with a CC of rash under armpits. Presented 2-3 days ago, burning, itching , and redness. No changes in environment factors no new medications     Review of Systems  Constitutional: Negative for fatigue and fever.  Skin: Positive for color change and rash.       Past Medical History:  Diagnosis Date  . Aorto-iliac atherosclerosis ([place]) 08/2015   by xray  . Barrett's esophagus    EGD 2012, rpt 2014, longterm PPI  . Chronic idiopathic constipation 07/17/2010  . Chronic obstructive pulmonary disease (COPD) (HCC)    spirometry with moderate COPD  . Coronary atherosclerosis of artery bypass graft   . Diabetes mellitus type II ~2008  . Gastrointestinal ulcer 04/2011   with hematemesis s/p EGD by [facility]  . GERD (gastroesophageal reflux disease)   . HLD (hyperlipidemia)   . HNP (herniated nucleus pulposus), lumbar 2013   with compression of L5 nerve root and foot drop, also with lumbar DDD s/p surgery  . Hypertrophy of prostate without urinary obstruction and other lower urinary tract symptoms (LUTS)   . Paroxysmal SVT (supraventricular tachycardia) ([place])    a. 3/14 => converted in ED with adenosine to AFib => NSR;  b. Echo 4/14:  Mild LVH, mild FBSH, EF 55-60%, Gr 1 DD  . Prostate nodule ~2009   Left-benign s/p eval Uro WNL ([place])  . Thrombocytopenia ([place])   . Tobacco use disorder    Past Surgical History:  Procedure Laterality Date  . COLONOSCOPY  03/2009   small hemorrhoids, 2 polyps (hyperplastic and adenomatous), rpt 5 yrs (Dr. Barron Schmid) - pt declined rpt scheduling  . COLONOSCOPY  10/2014   2 hyperplastic polyp, hemmorhoids ([place])  . CORONARY ARTERY BYPASS GRAFT  1999   Dr. Roxy Manns  . ESOPHAGOGASTRODUODENOSCOPY  05/16/2011   after hematemesis, no lesions found, consistent with barrett's esophagus rpt 2 yrs  .  ESOPHAGOGASTRODUODENOSCOPY  06/2013   barrett's, small HH, recheck 2-3 yrs ([place])  . HEMORRHOID SURGERY    . LUMBAR LAMINECTOMY/DECOMPRESSION MICRODISCECTOMY  01/31/2012   Procedure: LUMBAR LAMINECTOMY/DECOMPRESSION MICRODISCECTOMY 1 LEVEL;  Surgeon: Winfield Cunas, MD;  Location: [facility];  Service: Neurosurgery;  Laterality: Left;  LEFT Lumbar Four-Five Diskectomy  . NOSE SURGERY    . ROTATOR CUFF REPAIR  04/2010   right (but has bilat) Dr. Latanya Maudlin ([facility])   Social History   Socioeconomic History  . Marital status: Married    Spouse name: Not on file  . Number of children: Not on file  . Years of education: Not on file  . Highest education level: Not on file  Occupational History  . Occupation: Retired from [employer]  Social Needs  . Financial resource strain: Not on file  . Food insecurity:    Worry: Not on file    Inability: Not on file  . Transportation needs:    Medical: Not on file    Non-medical: Not on file  Tobacco Use  . Smoking status: Current Every Day Smoker    Packs/day: 0.50    Years: 60.00    Pack years: 30.00    Types: Cigarettes    Start date: 09/16/1952  . Smokeless tobacco: Current User    Types: Snuff  . Tobacco comment: < 1 ppd; does dip sometimes  Substance  and Sexual Activity  . Alcohol use: Yes    Comment: Seldom  . Drug use: No  . Sexual activity: Not on file  Lifestyle  . Physical activity:    Days per week: Not on file    Minutes per session: Not on file  . Stress: Not on file  Relationships  . Social connections:    Talks on phone: Not on file    Gets together: Not on file    Attends religious service: Not on file    Active member of club or organization: Not on file    Attends meetings of clubs or organizations: Not on file    Relationship status: Not on file  . Intimate partner violence:    Fear of current or ex partner: Not on file    Emotionally abused: Not on file    Physically abused: Not on file    Forced  sexual activity: Not on file  Other Topics Concern  . Not on file  Social History Narrative   Widower - wife passed away early 13-Dec-2016. Son lives nearby [place]), brings meals daily    Daughter lives in [place]   He is very active   He was in the [employer] for 4 years.    Activity: walking daily since back surgery    Diet: good water, not much fruits/vegetables    Family History  Problem Relation Age of Onset  . Cancer Father 74       colon   Current Outpatient Medications on File Prior to Visit  Medication Sig Dispense Refill  . albuterol (PROVENTIL HFA;VENTOLIN HFA) 108 (90 Base) MCG/ACT inhaler Inhale 2 puffs into the lungs every 6 (six) hours as needed for wheezing or shortness of breath. 1 Inhaler 3  . aspirin 81 MG tablet Take 81 mg by mouth daily.     . fluticasone (FLONASE) 50 MCG/ACT nasal spray Place 2 sprays into both nostrils daily. 16 g 1  . isosorbide mononitrate (IMDUR) 30 MG 24 hr tablet Take 0.5 tablets (15 mg total) by mouth daily. 45 tablet 4  . magnesium hydroxide (MILK OF MAGNESIA) 400 MG/5ML suspension Take 15 mLs by mouth daily as needed for moderate constipation. 360 mL 0  . metFORMIN (GLUCOPHAGE) 500 MG tablet TAKE 1 TABLET BY MOUTH TWO  TIMES DAILY WITH MEALS 180 tablet 4  . metoprolol tartrate (LOPRESSOR) 25 MG tablet TAKE ONE-HALF TABLET BY  MOUTH TWO TIMES DAILY 90 tablet 4  . nitroGLYCERIN (NITROSTAT) 0.4 MG SL tablet Place 1 tablet (0.4 mg total) under the tongue every 5 (five) minutes as needed. For chest pain-may repeat x3 30 tablet 3  . omeprazole (PRILOSEC) 40 MG capsule Take 1 capsule (40 mg total) by mouth daily. 90 capsule 4  . polyethylene glycol powder (GLYCOLAX/MIRALAX) powder Take 8.5 g by mouth daily. 3350 g 1  . simvastatin (ZOCOR) 40 MG tablet Take 1 tablet (40 mg total) by mouth at bedtime. 90 tablet 4   No current facility-administered medications on file prior to visit.     Objective:   Physical Exam  Constitutional: He appears  well-nourished.  Skin: Skin is warm, dry and intact. Rash noted. Rash is urticarial. Rash is not papular and not vesicular. There is erythema.  Skin is grossly dry and flaky with redness across chest and trunk   BP (!) 144/52   Pulse 65   Temp (!) 97.5 F (36.4 C) (Oral)   Wt 160 lb 12 oz (72.9 kg)   SpO2  97%   BMI 23.07 kg/m      Assessment & Plan:   1. Candidiasis of skin - fluconazole (DIFLUCAN) 150 MG tablet; Take one tablet today. Take one tablet next Friday. If rash not relieved, can take additional tablet every Friday for 2 more weeks.  Dispense: 4 tablet; Refill: 0   Denita Lung, RN, Adult-Geriatric Nurse Practitioner Student

## 2017-12-19 NOTE — Patient Instructions (Addendum)
It looks like you have a yeast infection under your armpits. We have sent a prescription to your pharmacy for Diflucan to clear the infection. Take medicine as follows:  Take one tablet today, 4/5, and one tablet next Friday on 4/12. IF your rash does not go away take one tablet on 4/19 and one more on 4/26.     The rest of your skin is very dry and red as well probably from taking hot showers. Try to only take warm showers and apply lotion to your skin every day.Do not need to put lotion under armpits or between groin.   It has been a pleasure seeing you today. Denita Lung, RN, Adult-Geriatric Nurse Practitioner Student and Tor Netters, FNP  Fluconazole (Diflucan) tablets What is this medicine? FLUCONAZOLE (floo KON na zole) is an antifungal medicine. It is used to treat certain kinds of fungal or yeast infections. This medicine may be used for other purposes; ask your health care provider or pharmacist if you have questions. COMMON BRAND NAME(S): Diflucan What should I tell my health care provider before I take this medicine? They need to know if you have any of these conditions: -history of irregular heart beat -kidney disease -an unusual or allergic reaction to fluconazole, other azole antifungals, medicines, foods, dyes, or preservatives -pregnant or trying to get pregnant -breast-feeding How should I use this medicine? Take this medicine by mouth. Follow the directions on the prescription label. Do not take your medicine more often than directed. Talk to your pediatrician regarding the use of this medicine in children. Special care may be needed. This medicine has been used in children as young as 56 months of age. Overdosage: If you think you have taken too much of this medicine contact a poison control center or emergency room at once. NOTE: This medicine is only for you. Do not share this medicine with others. What if I miss a dose? If you miss a dose, take it as soon  as you can. If it is almost time for your next dose, take only that dose. Do not take double or extra doses. What may interact with this medicine? Do not take this medicine with any of the following medications: -astemizole -certain medicines for irregular heart beat like dofetilide, dronedarone, quinidine -cisapride -erythromycin -lomitapide -other medicines that prolong the QT interval (cause an abnormal heart rhythm) -pimozide -terfenadine -thioridazine -tolvaptan -ziprasidone This medicine may also interact with the following medications: -antiviral medicines for HIV or AIDS -birth control pills -certain antibiotics like rifabutin, rifampin -certain medicines for blood pressure like amlodipine, isradipine, felodipine, hydrochlorothiazide, losartan, nifedipine -certain medicines for cancer like cyclophosphamide, vinblastine, vincristine -certain medicines for cholesterol like atorvastatin, lovastatin, fluvastatin, simvastatin -certain medicines for depression, anxiety, or psychotic disturbances like amitriptyline, midazolam, nortriptyline, triazolam -certain medicines for diabetes like glipizide, glyburide, tolbutamide -certain medicines for pain like alfentanil, fentanyl, methadone -certain medicines for seizures like carbamazepine, phenytoin -certain medicines that treat or prevent blood clots like warfarin -halofantrine -medicines that lower your chance of fighting infection like cyclosporine, prednisone, tacrolimus -NSAIDS, medicines for pain and inflammation, like celecoxib, diclofenac, flurbiprofen, ibuprofen, meloxicam, naproxen -other medicines for fungal infections -sirolimus -theophylline -tofacitinib This list may not describe all possible interactions. Give your health care provider a list of all the medicines, herbs, non-prescription drugs, or dietary supplements you use. Also tell them if you smoke, drink alcohol, or use illegal drugs. Some items may interact with  your medicine. What should I watch for while using this medicine?  Visit your doctor or health care professional for regular checkups. If you are taking this medicine for a long time you may need blood work. Tell your doctor if your symptoms do not improve. Some fungal infections need many weeks or months of treatment to cure. Alcohol can increase possible damage to your liver. Avoid alcoholic drinks. If you have a vaginal infection, do not have sex until you have finished your treatment. You can wear a sanitary napkin. Do not use tampons. Wear freshly washed cotton, not synthetic, panties. What side effects may I notice from receiving this medicine? Side effects that you should report to your doctor or health care professional as soon as possible: -allergic reactions like skin rash or itching, hives, swelling of the lips, mouth, tongue, or throat -dark urine -feeling dizzy or faint -irregular heartbeat or chest pain -redness, blistering, peeling or loosening of the skin, including inside the mouth -trouble breathing -unusual bruising or bleeding -vomiting -yellowing of the eyes or skin Side effects that usually do not require medical attention (report to your doctor or health care professional if they continue or are bothersome): -changes in how food tastes -diarrhea -headache -stomach upset or nausea This list may not describe all possible side effects. Call your doctor for medical advice about side effects. You may report side effects to FDA at 1-800-FDA-1088. Where should I keep my medicine? Keep out of the reach of children. Store at room temperature below 30 degrees C (86 degrees F). Throw away any medicine after the expiration date. NOTE: This sheet is a summary. It may not cover all possible information. If you have questions about this medicine, talk to your doctor, pharmacist, or health care provider.  2018 Elsevier/Gold Standard (2013-04-10 19:37:38)

## 2017-12-19 NOTE — Progress Notes (Signed)
Subjective:    Patient ID: Troy [name], male    DOB: 17-Jul-1933, 84 y.o.   MRN: [id number]  HPI This is an 84 yo male who presents today with 3 days of painful, itchy rash under his arms. He has not used any new soaps, lotions, deodorants, shampoos or traveled.  He has recently been on multiple rounds of antibiotic and prednisone for sinusitis. Last hemoglobin A1C 6.9 on 10/15/17.   Past Medical History:  Diagnosis Date  . Aorto-iliac atherosclerosis ([place]) 08/2015   by xray  . Barrett's esophagus    EGD 2012, rpt 2014, longterm PPI  . Chronic idiopathic constipation 07/17/2010  . Chronic obstructive pulmonary disease (COPD) ([facility])    spirometry with moderate COPD  . Coronary atherosclerosis of artery bypass graft   . Diabetes mellitus type II ~2008  . Gastrointestinal ulcer 04/2011   with hematemesis s/p EGD by [facility]  . GERD (gastroesophageal reflux disease)   . HLD (hyperlipidemia)   . HNP (herniated nucleus pulposus), lumbar 2013   with compression of L5 nerve root and foot drop, also with lumbar DDD s/p surgery  . Hypertrophy of prostate without urinary obstruction and other lower urinary tract symptoms (LUTS)   . Paroxysmal SVT (supraventricular tachycardia) ([place])    a. 3/14 => converted in ED with adenosine to AFib => NSR;  b. Echo 4/14:  Mild LVH, mild FBSH, EF 55-60%, Gr 1 DD  . Prostate nodule ~2009   Left-benign s/p eval Uro WNL ([place])  . Thrombocytopenia ([place])   . Tobacco use disorder    Past Surgical History:  Procedure Laterality Date  . COLONOSCOPY  03/2009   small hemorrhoids, 2 polyps (hyperplastic and adenomatous), rpt 5 yrs (Dr. Barron Schmid) - pt declined rpt scheduling  . COLONOSCOPY  10/2014   2 hyperplastic polyp, hemmorhoids ([place])  . CORONARY ARTERY BYPASS GRAFT  1999   Dr. Roxy Manns  . ESOPHAGOGASTRODUODENOSCOPY  05/16/2011   after hematemesis, no lesions found, consistent with barrett's esophagus rpt 2 yrs  . ESOPHAGOGASTRODUODENOSCOPY  06/2013   barrett's, small HH, recheck 2-3 yrs ([place])  . HEMORRHOID SURGERY    . LUMBAR LAMINECTOMY/DECOMPRESSION MICRODISCECTOMY  01/31/2012   Procedure: LUMBAR LAMINECTOMY/DECOMPRESSION MICRODISCECTOMY 1 LEVEL;  Surgeon: Winfield Cunas, MD;  Location: [facility];  Service: Neurosurgery;  Laterality: Left;  LEFT Lumbar Four-Five Diskectomy  . NOSE SURGERY    . ROTATOR CUFF REPAIR  04/2010   right (but has bilat) Dr. Latanya Maudlin ([facility])   Family History  Problem Relation Age of Onset  . Cancer Father 53       colon   Social History   Tobacco Use  . Smoking status: Current Every Day Smoker    Packs/day: 0.50    Years: 60.00    Pack years: 30.00    Types: Cigarettes    Start date: 09/16/1952  . Smokeless tobacco: Current User    Types: Snuff  . Tobacco comment: < 1 ppd; does dip sometimes  Substance Use Topics  . Alcohol use: Yes    Comment: Seldom  . Drug use: No      Review of Systems     Objective:   Physical Exam  Constitutional: He is oriented to person, place, and time. He appears well-developed and well-nourished.  HENT:  Head: Normocephalic and atraumatic.  Eyes: Conjunctivae are normal.  Cardiovascular: Normal rate.  Pulmonary/Chest: Effort normal.  Neurological: He is alert and oriented to person, place, and time.  Skin: Skin is warm  and dry. Rash noted.  Bilateral axilla with very inflamed, erythematous macular rash. Small areas excoriation noted. [place] scattered erythematous macules on trunk. Skin on trunk and arms very dry and scaly.   Psychiatric: He has a normal mood and affect. His behavior is normal. Judgment and thought content normal.  Vitals reviewed.     BP (!) 144/52   Pulse 65   Temp (!) 97.5 F (36.4 C) (Oral)   Wt 160 lb 12 oz (72.9 kg)   SpO2 97%   BMI 23.07 kg/m  Wt Readings from Last 3 Encounters:  12/19/17 160 lb 12 oz (72.9 kg)  11/04/17 153 lb (69.4 kg)  10/27/17 161 lb 8 oz (73.3 kg)       Assessment & Plan:  Discussed  with Dr. Danise Mina who also examined patient 1. Candidiasis of skin - likely candidal infection given patient's recent multi-course antibiotic use and prednisone  - fluconazole (DIFLUCAN) 150 MG tablet; Take one tablet today. Take one tablet next Friday. If rash not relieved, can take additional tablet every Friday for 2 more weeks.  Dispense: 4 tablet; Refill: 0 - he was instructed to keep area clean and dry  - RTC precautions reviewed - encouraged him to decrease time spent in hot shower, decrease water temperature to warm, apply lotion to dry skin daily.  Clarene Reamer, FNP-BC  [facility]  12/22/2017 9:46 AM

## 2017-12-22 ENCOUNTER — Encounter: Payer: Self-pay | Admitting: Family Medicine

## 2017-12-22 ENCOUNTER — Ambulatory Visit (INDEPENDENT_AMBULATORY_CARE_PROVIDER_SITE_OTHER): Payer: Medicare Other | Admitting: Family Medicine

## 2017-12-22 VITALS — BP 118/62 | HR 67 | Temp 98.0°F | Wt 158.0 lb

## 2017-12-22 DIAGNOSIS — B372 Candidiasis of skin and nail: Secondary | ICD-10-CM | POA: Diagnosis not present

## 2017-12-22 MED ORDER — NYSTATIN 100000 UNIT/GM EX POWD: Freq: Three times a day (TID) | CUTANEOUS | 0 refills | Status: DC

## 2017-12-22 NOTE — Progress Notes (Signed)
BP 118/62 (BP Location: Left Arm, Patient Position: Sitting, Cuff Size: Normal)   Pulse 67   Temp 98 F (36.7 C) (Oral)   Wt 158 lb (71.7 kg)   SpO2 96%   BMI 22.67 kg/m    CC: rash under arms Subjective:    Patient ID: Troy [name], male    DOB: 05-17-1933, 84 y.o.   MRN: [id number]  HPI: UNKNOWN Troy [name] is a 84 y.o. male presenting on 12/22/2017 for Underarm pain (C/o pain and drainage in bilateral axillary areas. Was seen 12/19/17 for same sxs. No improvement. )   See Friday's note for details - seen with several day h/o red rash under arms that was painful, itchy, burning and draining clear fluid. Denies new meds at that time. Thought candidiasis, treated with diflucan 150mg  weekly x2 wks, with enough for 4 weeks if needed. Here today for f/u - states he took 150mg  diflucan daily!! for the last 4 days. Persistent painful rash.   He has recently been on several antibiotics as well as prednisone course for sinusitis, latest ENT eval for L hearing loss.   Relevant past medical, surgical, family and social history reviewed and updated as indicated. Interim medical history since our last visit reviewed. Allergies and medications reviewed and updated. Outpatient Medications Prior to Visit  Medication Sig Dispense Refill  . albuterol (PROVENTIL HFA;VENTOLIN HFA) 108 (90 Base) MCG/ACT inhaler Inhale 2 puffs into the lungs every 6 (six) hours as needed for wheezing or shortness of breath. 1 Inhaler 3  . aspirin 81 MG tablet Take 81 mg by mouth daily.     . fluconazole (DIFLUCAN) 150 MG tablet Take one tablet today. Take one tablet next Friday. If rash not relieved, can take additional tablet every Friday for 2 more weeks. 4 tablet 0  . fluticasone (FLONASE) 50 MCG/ACT nasal spray Place 2 sprays into both nostrils daily. 16 g 1  . isosorbide mononitrate (IMDUR) 30 MG 24 hr tablet Take 0.5 tablets (15 mg total) by mouth daily. 45 tablet 4  . magnesium hydroxide (MILK OF MAGNESIA) 400  MG/5ML suspension Take 15 mLs by mouth daily as needed for moderate constipation. 360 mL 0  . metFORMIN (GLUCOPHAGE) 500 MG tablet TAKE 1 TABLET BY MOUTH TWO  TIMES DAILY WITH MEALS 180 tablet 4  . metoprolol tartrate (LOPRESSOR) 25 MG tablet TAKE ONE-HALF TABLET BY  MOUTH TWO TIMES DAILY 90 tablet 4  . nitroGLYCERIN (NITROSTAT) 0.4 MG SL tablet Place 1 tablet (0.4 mg total) under the tongue every 5 (five) minutes as needed. For chest pain-may repeat x3 30 tablet 3  . omeprazole (PRILOSEC) 40 MG capsule Take 1 capsule (40 mg total) by mouth daily. 90 capsule 4  . polyethylene glycol powder (GLYCOLAX/MIRALAX) powder Take 8.5 g by mouth daily. 3350 g 1  . simvastatin (ZOCOR) 40 MG tablet Take 1 tablet (40 mg total) by mouth at bedtime. 90 tablet 4   No facility-administered medications prior to visit.      Per HPI unless specifically indicated in ROS section below Review of Systems     Objective:    BP 118/62 (BP Location: Left Arm, Patient Position: Sitting, Cuff Size: Normal)   Pulse 67   Temp 98 F (36.7 C) (Oral)   Wt 158 lb (71.7 kg)   SpO2 96%   BMI 22.67 kg/m   Wt Readings from Last 3 Encounters:  12/22/17 158 lb (71.7 kg)  12/19/17 160 lb 12 oz (72.9 kg)  11/04/17  153 lb (69.4 kg)    Physical Exam  Constitutional: He appears well-developed and well-nourished. No distress.  Skin: Skin is warm. Rash noted. There is erythema.  Axillary erythema bilaterally with erythema extending into left chest, involving anterior chest as well, some scaling present throughout Rash spares arms, legs, face, back  Nursing note and vitals reviewed.     Assessment & Plan:   Problem List Items Addressed This Visit    Candidal intertrigo - Primary    Anticipate this, along with autoeczematization or dermatophytid reaction. He took too much diflucan - discussed with patient, and will have him hold simvastatin for the next week. Will Rx nystatin powder to apply TID to affected area, reviewed  further home care. Update with effect later this week. Pt agrees with plan.       Relevant Medications   nystatin (MYCOSTATIN/NYSTOP) powder       Meds ordered this encounter  Medications  . nystatin (MYCOSTATIN/NYSTOP) powder    Sig: Apply topically 3 (three) times daily.    Dispense:  45 g    Refill:  0   No orders of the defined types were placed in this encounter.   Follow up plan: No follow-ups on file.  [name], MD

## 2017-12-22 NOTE — Patient Instructions (Signed)
Hold simvastatin for the next week.  I still think you have fungal infection that should improve over time.  Use nystatin powder sent to pharmacy - apply to affected areas of skin three times daily.  Update [place] with how you're doing later this week.

## 2017-12-22 NOTE — Assessment & Plan Note (Signed)
Anticipate this, along with autoeczematization or dermatophytid reaction. He took too much diflucan - discussed with patient, and will have him hold simvastatin for the next week. Will Rx nystatin powder to apply TID to affected area, reviewed further home care. Update with effect later this week. Pt agrees with plan.

## 2017-12-29 DIAGNOSIS — H90A32 Mixed conductive and sensorineural hearing loss, unilateral, left ear with restricted hearing on the contralateral side: Secondary | ICD-10-CM | POA: Diagnosis not present

## 2017-12-29 DIAGNOSIS — H9122 Sudden idiopathic hearing loss, left ear: Secondary | ICD-10-CM | POA: Diagnosis not present

## 2017-12-29 DIAGNOSIS — H90A21 Sensorineural hearing loss, unilateral, right ear, with restricted hearing on the contralateral side: Secondary | ICD-10-CM | POA: Diagnosis not present

## 2017-12-31 ENCOUNTER — Encounter: Payer: Self-pay | Admitting: Family Medicine

## 2017-12-31 DIAGNOSIS — H903 Sensorineural hearing loss, bilateral: Secondary | ICD-10-CM | POA: Insufficient documentation

## 2018-03-24 ENCOUNTER — Ambulatory Visit (INDEPENDENT_AMBULATORY_CARE_PROVIDER_SITE_OTHER): Payer: Medicare Other | Admitting: Family Medicine

## 2018-03-24 ENCOUNTER — Encounter: Payer: Self-pay | Admitting: Family Medicine

## 2018-03-24 VITALS — BP 134/64 | HR 61 | Temp 97.8°F | Ht 70.0 in | Wt 164.5 lb

## 2018-03-24 DIAGNOSIS — L989 Disorder of the skin and subcutaneous tissue, unspecified: Secondary | ICD-10-CM | POA: Diagnosis not present

## 2018-03-24 NOTE — Assessment & Plan Note (Addendum)
Today abrasion seems to be arising out of benign SK.  I think what was removed was irritated SK, don't think it was a tick.  Dressed wound. I did ask him to let us know if not healing daily, recommend we recheck this at next visit.

## 2018-03-24 NOTE — Progress Notes (Signed)
BP 134/64 (BP Location: Left Arm, Patient Position: Sitting, Cuff Size: Normal)   Pulse 61   Temp 97.8 F (36.6 C) (Oral)   Ht 5\' 10"  (1.778 m)   Wt 164 lb 8 oz (74.6 kg)   SpO2 96%   BMI 23.60 kg/m    CC: skin check Subjective:    Patient ID: Troy [name], male    DOB: 1932/12/24, 84 y.o.   MRN: [id number]  HPI: Troy [name] is a 84 y.o. male presenting on 03/24/2018 for Skin Check (Pt has dry, patchy areas on mid right side of back he wants checked. Noticed about 2 days ago. Says he has been picking the area.)   Would like spot on back checked - noticed area 2 days ago, pulled darker area off but unsure what it was. Not tender or itchy. Has bled a bit.  Relevant past medical, surgical, family and social history reviewed and updated as indicated. Interim medical history since our last visit reviewed. Allergies and medications reviewed and updated. Outpatient Medications Prior to Visit  Medication Sig Dispense Refill  . albuterol (PROVENTIL HFA;VENTOLIN HFA) 108 (90 Base) MCG/ACT inhaler Inhale 2 puffs into the lungs every 6 (six) hours as needed for wheezing or shortness of breath. 1 Inhaler 3  . aspirin 81 MG tablet Take 81 mg by mouth daily.     . fluconazole (DIFLUCAN) 150 MG tablet Take one tablet today. Take one tablet next Friday. If rash not relieved, can take additional tablet every Friday for 2 more weeks. 4 tablet 0  . fluticasone (FLONASE) 50 MCG/ACT nasal spray Place 2 sprays into both nostrils daily. 16 g 1  . isosorbide mononitrate (IMDUR) 30 MG 24 hr tablet Take 0.5 tablets (15 mg total) by mouth daily. 45 tablet 4  . magnesium hydroxide (MILK OF MAGNESIA) 400 MG/5ML suspension Take 15 mLs by mouth daily as needed for moderate constipation. 360 mL 0  . metFORMIN (GLUCOPHAGE) 500 MG tablet TAKE 1 TABLET BY MOUTH TWO  TIMES DAILY WITH MEALS 180 tablet 4  . metoprolol tartrate (LOPRESSOR) 25 MG tablet TAKE ONE-HALF TABLET BY  MOUTH TWO TIMES DAILY 90 tablet 4  .  nitroGLYCERIN (NITROSTAT) 0.4 MG SL tablet Place 1 tablet (0.4 mg total) under the tongue every 5 (five) minutes as needed. For chest pain-may repeat x3 30 tablet 3  . nystatin (MYCOSTATIN/NYSTOP) powder Apply topically 3 (three) times daily. 45 g 0  . omeprazole (PRILOSEC) 40 MG capsule Take 1 capsule (40 mg total) by mouth daily. 90 capsule 4  . polyethylene glycol powder (GLYCOLAX/MIRALAX) powder Take 8.5 g by mouth daily. 3350 g 1  . simvastatin (ZOCOR) 40 MG tablet Take 1 tablet (40 mg total) by mouth at bedtime. 90 tablet 4   No facility-administered medications prior to visit.      Per HPI unless specifically indicated in ROS section below Review of Systems     Objective:    BP 134/64 (BP Location: Left Arm, Patient Position: Sitting, Cuff Size: Normal)   Pulse 61   Temp 97.8 F (36.6 C) (Oral)   Ht 5\' 10"  (1.778 m)   Wt 164 lb 8 oz (74.6 kg)   SpO2 96%   BMI 23.60 kg/m   Wt Readings from Last 3 Encounters:  03/24/18 164 lb 8 oz (74.6 kg)  12/22/17 158 lb (71.7 kg)  12/19/17 160 lb 12 oz (72.9 kg)    Physical Exam  Constitutional: He appears well-developed and well-nourished. No distress.  Skin: Skin is warm and dry.  Right mid lateral back with small abrasion arising in skin growth with stuck on appearance Small dark piece of skin removed with gauze, wound dressed with abx ointment and bandaid.  Nursing note and vitals reviewed.     Assessment & Plan:   Problem List Items Addressed This Visit    Skin lesion of back - Primary    Today abrasion seems to be arising out of benign SK.  I think what was removed was irritated SK, don't think it was a tick.  Dressed wound. I did ask him to let us know if not healing daily, recommend we recheck this at next visit.           No orders of the defined types were placed in this encounter.  No orders of the defined types were placed in this encounter.   Follow up plan: No follow-ups on file.  [name], MD

## 2018-03-26 ENCOUNTER — Telehealth: Payer: Self-pay

## 2018-03-26 NOTE — Telephone Encounter (Signed)
Copied from [place] [id number]. Topic: General - Call Back - No Documentation >> Mar 26, 2018 11:30 AM Marin Olp L wrote: Reason for CRM: patient missed a call

## 2018-03-26 NOTE — Telephone Encounter (Signed)
Spoke with pt stating I did not see any recent messages or results to relay from our office.  Pt verbalizes understanding.

## 2018-04-26 ENCOUNTER — Emergency Department (HOSPITAL_COMMUNITY): Payer: Medicare Other

## 2018-04-26 ENCOUNTER — Observation Stay (HOSPITAL_COMMUNITY)
Admission: EM | Admit: 2018-04-26 | Discharge: 2018-04-27 | Disposition: A | Discharge status: Home / Self Care | Payer: Medicare Other | Attending: Family Medicine | Admitting: Family Medicine

## 2018-04-26 ENCOUNTER — Other Ambulatory Visit: Payer: Self-pay

## 2018-04-26 ENCOUNTER — Encounter (HOSPITAL_COMMUNITY): Payer: Self-pay | Admitting: *Deleted

## 2018-04-26 DIAGNOSIS — N400 Enlarged prostate without lower urinary tract symptoms: Secondary | ICD-10-CM | POA: Diagnosis not present

## 2018-04-26 DIAGNOSIS — I4891 Unspecified atrial fibrillation: Secondary | ICD-10-CM | POA: Diagnosis not present

## 2018-04-26 DIAGNOSIS — Z79899 Other long term (current) drug therapy: Secondary | ICD-10-CM | POA: Diagnosis not present

## 2018-04-26 DIAGNOSIS — R55 Syncope and collapse: Secondary | ICD-10-CM | POA: Insufficient documentation

## 2018-04-26 DIAGNOSIS — Z885 Allergy status to narcotic agent status: Secondary | ICD-10-CM | POA: Insufficient documentation

## 2018-04-26 DIAGNOSIS — K219 Gastro-esophageal reflux disease without esophagitis: Secondary | ICD-10-CM | POA: Insufficient documentation

## 2018-04-26 DIAGNOSIS — I471 Supraventricular tachycardia: Principal | ICD-10-CM | POA: Insufficient documentation

## 2018-04-26 DIAGNOSIS — I10 Essential (primary) hypertension: Secondary | ICD-10-CM | POA: Insufficient documentation

## 2018-04-26 DIAGNOSIS — E119 Type 2 diabetes mellitus without complications: Secondary | ICD-10-CM | POA: Diagnosis not present

## 2018-04-26 DIAGNOSIS — E7800 Pure hypercholesterolemia, unspecified: Secondary | ICD-10-CM | POA: Diagnosis present

## 2018-04-26 DIAGNOSIS — Z7982 Long term (current) use of aspirin: Secondary | ICD-10-CM | POA: Insufficient documentation

## 2018-04-26 DIAGNOSIS — Z7984 Long term (current) use of oral hypoglycemic drugs: Secondary | ICD-10-CM | POA: Diagnosis not present

## 2018-04-26 DIAGNOSIS — J449 Chronic obstructive pulmonary disease, unspecified: Secondary | ICD-10-CM | POA: Insufficient documentation

## 2018-04-26 DIAGNOSIS — R270 Ataxia, unspecified: Secondary | ICD-10-CM

## 2018-04-26 DIAGNOSIS — E860 Dehydration: Secondary | ICD-10-CM | POA: Insufficient documentation

## 2018-04-26 DIAGNOSIS — M5126 Other intervertebral disc displacement, lumbar region: Secondary | ICD-10-CM | POA: Diagnosis not present

## 2018-04-26 DIAGNOSIS — I2510 Atherosclerotic heart disease of native coronary artery without angina pectoris: Secondary | ICD-10-CM | POA: Insufficient documentation

## 2018-04-26 DIAGNOSIS — K2270 Barrett's esophagus without dysplasia: Secondary | ICD-10-CM | POA: Diagnosis not present

## 2018-04-26 DIAGNOSIS — R29818 Other symptoms and signs involving the nervous system: Secondary | ICD-10-CM | POA: Diagnosis not present

## 2018-04-26 DIAGNOSIS — Z888 Allergy status to other drugs, medicaments and biological substances status: Secondary | ICD-10-CM | POA: Insufficient documentation

## 2018-04-26 DIAGNOSIS — Z9114 Patient's other noncompliance with medication regimen: Secondary | ICD-10-CM | POA: Insufficient documentation

## 2018-04-26 DIAGNOSIS — E785 Hyperlipidemia, unspecified: Secondary | ICD-10-CM | POA: Diagnosis present

## 2018-04-26 DIAGNOSIS — R0902 Hypoxemia: Secondary | ICD-10-CM | POA: Diagnosis not present

## 2018-04-26 DIAGNOSIS — R42 Dizziness and giddiness: Secondary | ICD-10-CM | POA: Diagnosis not present

## 2018-04-26 DIAGNOSIS — E118 Type 2 diabetes mellitus with unspecified complications: Secondary | ICD-10-CM | POA: Diagnosis present

## 2018-04-26 LAB — CBC
HEMATOCRIT: 41.1 % (ref 39.0–52.0)
Hemoglobin: 13.6 g/dL (ref 13.0–17.0)
MCH: 28.3 pg (ref 26.0–34.0)
MCHC: 33.1 g/dL (ref 30.0–36.0)
MCV: 85.6 fL (ref 78.0–100.0)
Platelets: 191 10*3/uL (ref 150–400)
RBC: 4.8 MIL/uL (ref 4.22–5.81)
RDW: 15.2 % (ref 11.5–15.5)
WBC: 6.7 10*3/uL (ref 4.0–10.5)

## 2018-04-26 LAB — URINALYSIS, ROUTINE W REFLEX MICROSCOPIC
BILIRUBIN URINE: NEGATIVE
Glucose, UA: NEGATIVE mg/dL
HGB URINE DIPSTICK: NEGATIVE
KETONES UR: 5 mg/dL — AB
Leukocytes, UA: NEGATIVE
Nitrite: NEGATIVE
Protein, ur: NEGATIVE mg/dL
SPECIFIC GRAVITY, URINE: 1.011 (ref 1.005–1.030)
pH: 6 (ref 5.0–8.0)

## 2018-04-26 LAB — TROPONIN I: Troponin I: <0.03 ng/mL (ref <0.03)

## 2018-04-26 LAB — BASIC METABOLIC PANEL
Anion gap: 10 (ref 5–15)
BUN: 30 mg/dL — AB (ref 8–23)
CALCIUM: 9.9 mg/dL (ref 8.9–10.3)
CO2: 23 mmol/L (ref 22–32)
Chloride: 108 mmol/L (ref 98–111)
Creatinine, Ser: 1.28 mg/dL — ABNORMAL HIGH (ref 0.61–1.24)
GFR calc Af Amer: 58 mL/min — ABNORMAL LOW (ref >60)
GFR calc non Af Amer: 50 mL/min — ABNORMAL LOW (ref >60)
GLUCOSE: 105 mg/dL — AB (ref 70–99)
Potassium: 4.5 mmol/L (ref 3.5–5.1)
Sodium: 141 mmol/L (ref 135–145)

## 2018-04-26 LAB — CBG MONITORING, ED: Glucose-Capillary: 102 mg/dL — ABNORMAL HIGH (ref 70–99)

## 2018-04-26 LAB — I-STAT TROPONIN, ED: Troponin i, poc: 0.01 ng/mL (ref 0.00–0.08)

## 2018-04-26 LAB — MAGNESIUM: Magnesium: 2.2 mg/dL (ref 1.7–2.4)

## 2018-04-26 LAB — GLUCOSE, CAPILLARY: GLUCOSE-CAPILLARY: 100 mg/dL — AB (ref 70–99)

## 2018-04-26 MED ORDER — HEPARIN SODIUM (PORCINE) 5000 UNIT/ML IJ SOLN
5000.0000 [IU] | Freq: Three times a day (TID) | INTRAMUSCULAR | Status: DC
  Administered 2018-04-26 – 2018-04-27 (×2): 5000 [IU] via SUBCUTANEOUS
  Filled 2018-04-26 (×2): qty 1

## 2018-04-26 MED ORDER — ASPIRIN 81 MG PO CHEW
81.0000 mg | CHEWABLE_TABLET | Freq: Every day | ORAL | Status: DC
  Administered 2018-04-26 – 2018-04-27 (×2): 81 mg via ORAL
  Filled 2018-04-26 (×2): qty 1

## 2018-04-26 MED ORDER — METOPROLOL TARTRATE 5 MG/5ML IV SOLN
INTRAVENOUS | Status: AC
  Filled 2018-04-26: qty 5

## 2018-04-26 MED ORDER — ONDANSETRON HCL 4 MG/2ML IJ SOLN: 4.0000 mg | Freq: Four times a day (QID) | INTRAMUSCULAR | Status: DC | PRN

## 2018-04-26 MED ORDER — METOPROLOL TARTRATE 25 MG PO TABS
12.5000 mg | ORAL_TABLET | Freq: Two times a day (BID) | ORAL | Status: DC
  Administered 2018-04-26 – 2018-04-27 (×2): 12.5 mg via ORAL
  Filled 2018-04-26 (×2): qty 1

## 2018-04-26 MED ORDER — FLUTICASONE PROPIONATE 50 MCG/ACT NA SUSP
2.0000 | Freq: Every day | NASAL | Status: DC
  Administered 2018-04-26 – 2018-04-27 (×2): 2 via NASAL
  Filled 2018-04-26: qty 16

## 2018-04-26 MED ORDER — METOPROLOL TARTRATE 5 MG/5ML IV SOLN
5.0000 mg | Freq: Once | INTRAVENOUS | Status: AC
  Administered 2018-04-26: 5 mg via INTRAVENOUS

## 2018-04-26 MED ORDER — PANTOPRAZOLE SODIUM 40 MG PO TBEC
40.0000 mg | DELAYED_RELEASE_TABLET | Freq: Every day | ORAL | Status: DC
  Administered 2018-04-26 – 2018-04-27 (×2): 40 mg via ORAL
  Filled 2018-04-26 (×2): qty 1

## 2018-04-26 MED ORDER — NYSTATIN 100000 UNIT/GM EX POWD
Freq: Three times a day (TID) | CUTANEOUS | Status: DC
  Filled 2018-04-26: qty 15

## 2018-04-26 MED ORDER — SIMVASTATIN 40 MG PO TABS
40.0000 mg | ORAL_TABLET | Freq: Every day | ORAL | Status: DC
  Administered 2018-04-26: 40 mg via ORAL
  Filled 2018-04-26: qty 1

## 2018-04-26 MED ORDER — ONDANSETRON HCL 4 MG PO TABS: 4.0000 mg | ORAL_TABLET | Freq: Four times a day (QID) | ORAL | Status: DC | PRN

## 2018-04-26 MED ORDER — ACETAMINOPHEN 325 MG PO TABS: 650.0000 mg | ORAL_TABLET | Freq: Four times a day (QID) | ORAL | Status: DC | PRN

## 2018-04-26 MED ORDER — SODIUM CHLORIDE 0.9 % IV BOLUS
1000.0000 mL | Freq: Once | INTRAVENOUS | Status: AC
  Administered 2018-04-26: 1000 mL via INTRAVENOUS

## 2018-04-26 MED ORDER — SODIUM CHLORIDE 0.45 % IV SOLN
INTRAVENOUS | Status: AC
  Administered 2018-04-26: 21:00:00 via INTRAVENOUS

## 2018-04-26 MED ORDER — METFORMIN HCL 500 MG PO TABS
500.0000 mg | ORAL_TABLET | Freq: Two times a day (BID) | ORAL | Status: DC
  Administered 2018-04-27: 500 mg via ORAL
  Filled 2018-04-26: qty 1

## 2018-04-26 MED ORDER — ACETAMINOPHEN 650 MG RE SUPP: 650.0000 mg | Freq: Four times a day (QID) | RECTAL | Status: DC | PRN

## 2018-04-26 MED ORDER — MECLIZINE HCL 25 MG PO TABS
25.0000 mg | ORAL_TABLET | Freq: Once | ORAL | Status: AC
  Administered 2018-04-26: 25 mg via ORAL
  Filled 2018-04-26: qty 1

## 2018-04-26 NOTE — ED Triage Notes (Addendum)
EMS states since Friday pt has had generalized weakness, dizziness and decreased appetite. Denies pain, N/V/D. Nothing changes dizziness. #20 R FA 180/75- CBG 91

## 2018-04-26 NOTE — ED Provider Notes (Signed)
[facility] DEPT Provider Note   CSN: [id number] Arrival date & time: 04/26/18  1022     History   Chief Complaint Chief Complaint  Patient presents with  . Weakness    HPI Troy [name] is a 84 y.o. male.  84 year old male who presents with acute onset of dizziness and weakness which began 2 days ago.  States that he has been ataxic when he tries to walk.  Describes the room is spinning and worse with certain head motions as well.  Denies any nausea vomiting or diarrhea.  No fever or chills.  No chest pain or shortness of breath.  No abdominal discomfort.  No recent history of blood loss.  No urinary symptoms.  Called EMS and CBG was 91.  No prior history of same.  No recent changes to his medications.       Past Medical History:  Diagnosis Date  . Aorto-iliac atherosclerosis ([place]) 08/2015   by xray  . Barrett's esophagus    EGD 2012, rpt 2014, longterm PPI  . Chronic idiopathic constipation 07/17/2010  . Chronic obstructive pulmonary disease (COPD) (HCC)    spirometry with moderate COPD  . Coronary atherosclerosis of artery bypass graft   . Diabetes mellitus type II ~2008  . Gastrointestinal ulcer 04/2011   with hematemesis s/p EGD by [facility]  . GERD (gastroesophageal reflux disease)   . HLD (hyperlipidemia)   . HNP (herniated nucleus pulposus), lumbar 2013   with compression of L5 nerve root and foot drop, also with lumbar DDD s/p surgery  . Hypertrophy of prostate without urinary obstruction and other lower urinary tract symptoms (LUTS)   . Paroxysmal SVT (supraventricular tachycardia) ([place])    a. 3/14 => converted in ED with adenosine to AFib => NSR;  b. Echo 4/14:  Mild LVH, mild FBSH, EF 55-60%, Gr 1 DD  . Prostate nodule ~2009   Left-benign s/p eval Uro WNL ([place])  . Thrombocytopenia ([place])   . Tobacco use disorder     Patient Active Problem List   Diagnosis Date Noted  . Sensorineural hearing loss (SNHL) of both ears  12/31/2017  . [place] intertrigo 12/22/2017  . Left serous otitis media 07/04/2017  . Hypertension 05/22/2017  . Eustachian tube dysfunction, left 04/04/2017  . Dry skin dermatitis 12/02/2016  . Health maintenance examination 10/08/2016  . Advanced care planning/counseling discussion 10/08/2016  . Thyromegaly 10/08/2016  . Anemia 10/08/2016  . Aorto-iliac atherosclerosis ([place]) 08/17/2015  . Bleeding hemorrhoid 06/13/2015  . Memory deficit 06/13/2015  . Orthostatic dizziness 08/01/2014  . Paresthesia of foot, bilateral 08/01/2014  . Mid back pain on right side 03/28/2014  . Supraventricular tachycardia ([place]) 11/24/2012  . CAD (coronary artery disease) 11/24/2012  . Lumbosacral radiculopathy at L5 12/23/2011  . Thrombocytopenia ([place])   . Barrett's esophagus   . Skin lesion of back 09/16/2011  . PUD (peptic ulcer disease) 05/16/2011  . Medicare annual wellness visit, subsequent 04/30/2011  . Acute sinusitis 01/02/2011  . COPD (chronic obstructive pulmonary disease) ([place]) 12/26/2010  . Diabetes mellitus type 2 with complications ([place]) 02/54/2706  . Chronic idiopathic constipation 07/17/2010  . HYPERCHOLESTEROLEMIA 05/13/2009  . Smoker 05/13/2009    Past Surgical History:  Procedure Laterality Date  . COLONOSCOPY  03/2009   small hemorrhoids, 2 polyps (hyperplastic and adenomatous), rpt 5 yrs (Dr. Barron Schmid) - pt declined rpt scheduling  . COLONOSCOPY  10/2014   2 hyperplastic polyp, hemmorhoids ([place])  . [place]  Dr. Roxy Manns  . ESOPHAGOGASTRODUODENOSCOPY  05/16/2011   after hematemesis, no lesions found, consistent with barrett's esophagus rpt 2 yrs  . ESOPHAGOGASTRODUODENOSCOPY  06/2013   barrett's, small HH, recheck 2-3 yrs ([place])  . HEMORRHOID SURGERY    . LUMBAR LAMINECTOMY/DECOMPRESSION MICRODISCECTOMY  01/31/2012   Procedure: LUMBAR LAMINECTOMY/DECOMPRESSION MICRODISCECTOMY 1 LEVEL;  Surgeon: Winfield Cunas, MD;  Location: [facility];   Service: Neurosurgery;  Laterality: Left;  LEFT Lumbar Four-Five Diskectomy  . NOSE SURGERY    . ROTATOR CUFF REPAIR  04/2010   right (but has bilat) Dr. Latanya Maudlin ([facility])        Home Medications    Prior to Admission medications   Medication Sig Start Date End Date Taking? Authorizing Provider  albuterol (PROVENTIL HFA;VENTOLIN HFA) 108 (90 Base) MCG/ACT inhaler Inhale 2 puffs into the lungs every 6 (six) hours as needed for wheezing or shortness of breath. 11/04/17   [name], MD  aspirin 81 MG tablet Take 81 mg by mouth daily.     [provider]  fluconazole (DIFLUCAN) 150 MG tablet Take one tablet today. Take one tablet next Friday. If rash not relieved, can take additional tablet every Friday for 2 more weeks. 12/19/17   Elby Beck, FNP  fluticasone (FLONASE) 50 MCG/ACT nasal spray Place 2 sprays into both nostrils daily. 10/27/17   [name], MD  isosorbide mononitrate (IMDUR) 30 MG 24 hr tablet Take 0.5 tablets (15 mg total) by mouth daily. 10/15/17   [name], MD  magnesium hydroxide (MILK OF MAGNESIA) 400 MG/5ML suspension Take 15 mLs by mouth daily as needed for moderate constipation. 11/01/14   [name], MD  metFORMIN (GLUCOPHAGE) 500 MG tablet TAKE 1 TABLET BY MOUTH TWO  TIMES DAILY WITH MEALS 10/15/17   [name], MD  metoprolol tartrate (LOPRESSOR) 25 MG tablet TAKE ONE-HALF TABLET BY  MOUTH TWO TIMES DAILY 10/15/17   [name], MD  nitroGLYCERIN (NITROSTAT) 0.4 MG SL tablet Place 1 tablet (0.4 mg total) under the tongue every 5 (five) minutes as needed. For chest pain-may repeat x3 03/29/13   [name], MD  nystatin (MYCOSTATIN/NYSTOP) powder Apply topically 3 (three) times daily. 12/22/17   [name], MD  omeprazole (PRILOSEC) 40 MG capsule Take 1 capsule (40 mg total) by mouth daily. 10/15/17   [name], MD  polyethylene glycol powder [facility]) powder Take 8.5 g by mouth daily.  06/13/15   [name], MD  simvastatin (ZOCOR) 40 MG tablet Take 1 tablet (40 mg total) by mouth at bedtime. 10/15/17   [name], MD    Family History Family History  Problem Relation Age of Onset  . Cancer Father 67       colon    Social History Social History   Tobacco Use  . Smoking status: Current Every Day Smoker    Packs/day: 0.50    Years: 60.00    Pack years: 30.00    Types: Cigarettes    Start date: 09/16/1952  . Smokeless tobacco: Current User    Types: Snuff  . Tobacco comment: < 1 ppd; does dip sometimes  Substance Use Topics  . Alcohol use: Yes    Comment: Seldom  . Drug use: No     Allergies   Codeine; Cyclobenzaprine hcl; Hydrocodone; Tramadol; and Valium   Review of Systems Review of Systems  All other systems reviewed and are negative.    Physical Exam Updated Vital Signs BP (!) 156/95 (BP Location: Left Arm)  Pulse 64   Temp 97.7 F (36.5 C) (Oral)   Resp 17   Ht 1.803 m (5\' 11" )   Wt 63.5 kg   SpO2 98%   BMI 19.53 kg/m   Physical Exam  Constitutional: He is oriented to person, place, and time. He appears well-developed and well-nourished.  Non-toxic appearance. No distress.  HENT:  Head: Normocephalic and atraumatic.  Eyes: Pupils are equal, round, and reactive to light. Conjunctivae, EOM and lids are normal.  Neck: Normal range of motion. Neck supple. No tracheal deviation present. No thyroid mass present.  Cardiovascular: Normal rate, regular rhythm and normal heart sounds. Exam reveals no gallop.  No murmur heard. Pulmonary/Chest: Effort normal and breath sounds normal. No stridor. No respiratory distress. He has no decreased breath sounds. He has no wheezes. He has no rhonchi. He has no rales.  Abdominal: Soft. Normal appearance and bowel sounds are normal. He exhibits no distension. There is no tenderness. There is no rebound and no CVA tenderness.  Musculoskeletal: Normal range of motion. He exhibits no edema or  tenderness.  Neurological: He is alert and oriented to person, place, and time. He displays tremor. No cranial nerve deficit or sensory deficit. GCS eye subscore is 4. GCS verbal subscore is 5. GCS motor subscore is 6.  Dysmetria noted Strength 5/5 upper and lower extrem   Skin: Skin is warm and dry. No abrasion and no rash noted.  Psychiatric: He has a normal mood and affect. His speech is normal and behavior is normal.  Nursing note and vitals reviewed.    ED Treatments / Results  Labs (all labs ordered are listed, but only abnormal results are displayed) Labs Reviewed  BASIC METABOLIC PANEL  CBC  URINALYSIS, ROUTINE W REFLEX MICROSCOPIC  CBG MONITORING, ED    EKG EKG Interpretation  Date/Time:  Sunday April 26 2018 10:40:49 EDT Ventricular Rate:  65 PR Interval:    QRS Duration: 79 QT Interval:  398 QTC Calculation: 414 R Axis:   32 Text Interpretation:  Sinus rhythm Atrial premature complex Borderline repolarization abnormality Confirmed by Lacretia Leigh ([id number]) on 04/26/2018 10:48:42 AM   Radiology No results found.  Procedures Procedures (including critical care time)  Medications Ordered in ED Medications  meclizine (ANTIVERT) tablet 25 mg (has no administration in time range)  sodium chloride 0.9 % bolus 1,000 mL (has no administration in time range)     Initial Impression / Assessment and Plan / ED Course  I have reviewed the triage vital signs and the nursing notes.  Pertinent labs & imaging results that were available during my care of the patient were reviewed by me and considered in my medical decision making (see chart for details).    1:13 PM  Pt with h/o svt and recently missed his beta blocker Developed svt here with chest pressure and required lopressor 5mg  ivp Hr decreased and he feels better Mri ordered and pending at this time  3:56 PM Patient troponin negative.  He has been in sinus rhythm.  MRI of brain was negative for acute  infarct.  Did feel somewhat better after receiving dose of Antivert but when I tried to ablate the patient he was severely off balance and ataxic.  Could be from vertigo.  Will consult hospitalist for admission   CRITICAL CARE Performed by: Leota Jacobsen Total critical care time: 50 minutes Critical care time was exclusive of separately billable procedures and treating other patients. Critical care was necessary to treat or prevent  imminent or life-threatening deterioration. Critical care was time spent personally by me on the following activities: development of treatment plan with patient and/or surrogate as well as nursing, discussions with consultants, evaluation of patient's response to treatment, examination of patient, obtaining history from patient or surrogate, ordering and performing treatments and interventions, ordering and review of laboratory studies, ordering and review of radiographic studies, pulse oximetry and re-evaluation of patient's condition.   Final Clinical Impressions(s) / ED Diagnoses   Final diagnoses:  None    ED Discharge Orders    None       Lacretia Leigh, MD 04/26/18 1557

## 2018-04-26 NOTE — H&P (Signed)
4       History and Physical    Troy [name]:[id number] DOB: Feb 28, 1933 DOA: 04/26/2018  PCP: [name], MD   Patient coming from: Home.  I have personally briefly reviewed patient's old medical records in [place]  Chief Complaint: Weakness, dizziness and decreased appetite.  HPI: Troy [name] is a 84 y.o. male with medical history significant of aortoiliac atherosclerosis, Barrett's esophagus, chronic idiopathic constipation, COPD, tobacco use, CAD/CABG, type 2 diabetes, gastrointestinal ulcer with hematemesis, GERD, hyperlipidemia, history of lumbar herniation of nucleus pulposus with compression of L5 nerve followed by back surgery, BPH, prostate nodule, thrombocytopenia, paroxysmal supraventricular tachycardia who is coming to the emergency department with complaints of generalized weakness associated with dizziness and decreased appetite since yesterday.  He has not eaten anything since yesterday.  He has being unable to ambulate without assistance since yesterday as well.  He also complains of a generalized headache, which he thinks is due to hunger.  No fever, no chills, no rigors, but feels fatigued.  Denies sore throat, productive cough, hemoptysis, chest pain, dizziness, diaphoresis, PND, orthopnea or pitting edema of the lower extremities.  While in the ER, the patient had some palpitations, which he stated he gets on occasion.  He had not taking his beta-blocker in the past 2 days.  He was found to be in SVT in the 140s to 150s which resolved with metoprolol.  Tartrate 5 mg IVP x1.  Denies abdominal pain, nausea, emesis, diarrhea, melena or hematochezia.  No dysuria, frequency or hematuria.  Denies polyuria, polydipsia, polyphagia or blurred vision.  ED Course: Signs temperature 97.7 F, pulse 64, respirations 17, blood pressure 156/95 mmHg and O2 sat 98% on room air.    Urinalysis showed 5 mg/dL of ketonuria, but otherwise normal.  CBC showed a white count  6.7, hemoglobin 13.6 g/dL and platelets 191.  Troponin was normal.  Sodium 141, potassium 4.5, chloride 108 and CO2 23.  His glucose was 105, BUN 30, creatinine 1.28 and calcium 9.9 mg/dL.  MRI of the brain did not show any acute intracranial normality.   Review of Systems: As per HPI otherwise 10 point review of systems negative.   Past Medical History:  Diagnosis Date  . Aorto-iliac atherosclerosis ([place]) 08/2015   by xray  . Barrett's esophagus    EGD 2012, rpt 2014, longterm PPI  . Chronic idiopathic constipation 07/17/2010  . Chronic obstructive pulmonary disease (COPD) (HCC)    spirometry with moderate COPD  . Coronary atherosclerosis of artery bypass graft   . Diabetes mellitus type II ~2008  . Gastrointestinal ulcer 04/2011   with hematemesis s/p EGD by [facility]  . GERD (gastroesophageal reflux disease)   . HLD (hyperlipidemia)   . HNP (herniated nucleus pulposus), lumbar 2013   with compression of L5 nerve root and foot drop, also with lumbar DDD s/p surgery  . Hypertrophy of prostate without urinary obstruction and other lower urinary tract symptoms (LUTS)   . Paroxysmal SVT (supraventricular tachycardia) ([place])    a. 3/14 => converted in ED with adenosine to AFib => NSR;  b. Echo 4/14:  Mild LVH, mild FBSH, EF 55-60%, Gr 1 DD  . Prostate nodule ~2009   Left-benign s/p eval Uro WNL ([place])  . Thrombocytopenia ([place])   . Tobacco use disorder     Past Surgical History:  Procedure Laterality Date  . COLONOSCOPY  03/2009   small hemorrhoids, 2 polyps (hyperplastic and adenomatous), rpt 5 yrs (Dr. Barron Schmid) -  pt declined rpt scheduling  . COLONOSCOPY  10/2014   2 hyperplastic polyp, hemmorhoids ([place])  . CORONARY ARTERY BYPASS GRAFT  1999   Dr. Roxy Manns  . ESOPHAGOGASTRODUODENOSCOPY  05/16/2011   after hematemesis, no lesions found, consistent with barrett's esophagus rpt 2 yrs  . ESOPHAGOGASTRODUODENOSCOPY  06/2013   barrett's, small HH, recheck 2-3 yrs ([place])  . HEMORRHOID  SURGERY    . LUMBAR LAMINECTOMY/DECOMPRESSION MICRODISCECTOMY  01/31/2012   Procedure: LUMBAR LAMINECTOMY/DECOMPRESSION MICRODISCECTOMY 1 LEVEL;  Surgeon: Winfield Cunas, MD;  Location: [facility];  Service: Neurosurgery;  Laterality: Left;  LEFT Lumbar Four-Five Diskectomy  . NOSE SURGERY    . ROTATOR CUFF REPAIR  04/2010   right (but has bilat) Dr. Latanya Maudlin ([place])     reports that he has been smoking cigarettes. He started smoking about 65 years ago. He has a 30.00 pack-year smoking history. His smokeless tobacco use includes snuff. He reports that he drinks alcohol. He reports that he does not use drugs.  Allergies  Allergen Reactions  . Codeine     REACTION: vomiting  . Cyclobenzaprine Hcl     REACTION: vomiting  . Hydrocodone Nausea Only and Other (See Comments)    "crazy"  . Tramadol Other (See Comments)    woozy  . Valium Other (See Comments)    sedation    Family History  Problem Relation Age of Onset  . Cancer Father 86       colon    Prior to Admission medications   Medication Sig Start Date End Date Taking? Authorizing Provider  aspirin 81 MG tablet Take 81 mg by mouth daily.    Yes [provider]  fluticasone (FLONASE) 50 MCG/ACT nasal spray Place 2 sprays into both nostrils daily. 10/27/17  Yes [name], MD  isosorbide mononitrate (IMDUR) 30 MG 24 hr tablet Take 0.5 tablets (15 mg total) by mouth daily. 10/15/17  Yes [name], MD  magnesium hydroxide (MILK OF MAGNESIA) 400 MG/5ML suspension Take 15 mLs by mouth daily as needed for moderate constipation. 11/01/14  Yes [name], MD  metFORMIN (GLUCOPHAGE) 500 MG tablet TAKE 1 TABLET BY MOUTH TWO  TIMES DAILY WITH MEALS 10/15/17  Yes [name], MD  metoprolol tartrate (LOPRESSOR) 25 MG tablet TAKE ONE-HALF TABLET BY  MOUTH TWO TIMES DAILY 10/15/17  Yes [name], MD  nystatin (MYCOSTATIN/NYSTOP) powder Apply topically 3 (three) times daily. 12/22/17  Yes [name], MD  omeprazole (PRILOSEC) 40 MG capsule Take 1 capsule (40 mg total) by mouth daily. 10/15/17  Yes [name], MD  simvastatin (ZOCOR) 40 MG tablet Take 1 tablet (40 mg total) by mouth at bedtime. 10/15/17  Yes [name], MD  albuterol (PROVENTIL HFA;VENTOLIN HFA) 108 (90 Base) MCG/ACT inhaler Inhale 2 puffs into the lungs every 6 (six) hours as needed for wheezing or shortness of breath. Patient not taking: Reported on 04/26/2018 11/04/17   [name], MD  fluconazole (DIFLUCAN) 150 MG tablet Take one tablet today. Take one tablet next Friday. If rash not relieved, can take additional tablet every Friday for 2 more weeks. Patient not taking: Reported on 04/26/2018 12/19/17   Elby Beck, FNP    Physical Exam: Vitals:   04/26/18 1310 04/26/18 1311 04/26/18 1312 04/26/18 1841  BP: (!) 144/119   (!) 158/75  Pulse: (!) 57 (!) 52 (!) 58 66  Resp: 20 14 18 20   Temp:    97.9 F (36.6 C)  TempSrc:    Oral  SpO2: 100% 97% 100% 100%  Weight:      Height:        Constitutional: NAD, calm, comfortable Eyes: PERRL, lids and conjunctivae normal ENMT: Mucous membranes are moist. Posterior pharynx clear of any exudate or lesions. Neck: normal, supple, no masses, no thyromegaly Respiratory: clear to auscultation bilaterally, no wheezing, no crackles. Normal respiratory effort. No accessory muscle use.  Cardiovascular: Regular rate and rhythm, no murmurs / rubs / gallops. No extremity edema. 2+ pedal pulses. No carotid bruits.  Abdomen: Soft, NT, no tenderness, no masses palpated. No hepatosplenomegaly. Bowel sounds positive.  Musculoskeletal: no clubbing / cyanosis. Good ROM, no contractures. Normal muscle tone.  Skin: Positive mild erythema and hyperkeratosis on ACW. Neurologic: CN 2-12 grossly intact. Sensation intact, DTR normal.  Generalized, but nonfocal weakness.  Normal nose to finger. Unable to evaluate gait. Psychiatric: Normal judgment and insight. Alert and  oriented x 4. Normal mood.    Labs on Admission: I have personally reviewed following labs and imaging studies  CBC: Recent Labs  Lab 04/26/18 1057  WBC 6.7  HGB 13.6  HCT 41.1  MCV 85.6  PLT 194   Basic Metabolic Panel: Recent Labs  Lab 04/26/18 1057  NA 141  K 4.5  CL 108  CO2 23  GLUCOSE 105*  BUN 30*  CREATININE 1.28*  CALCIUM 9.9  MG 2.2   GFR: Estimated Creatinine Clearance: 40 mL/min (A) (by C-G formula based on SCr of 1.28 mg/dL (H)). Liver Function Tests: No results for input(s): AST, ALT, ALKPHOS, BILITOT, PROT, ALBUMIN in the last 168 hours. No results for input(s): LIPASE, AMYLASE in the last 168 hours. No results for input(s): AMMONIA in the last 168 hours. Coagulation Profile: No results for input(s): INR, PROTIME in the last 168 hours. Cardiac Enzymes: No results for input(s): CKTOTAL, CKMB, CKMBINDEX, TROPONINI in the last 168 hours. BNP (last 3 results) No results for input(s): PROBNP in the last 8760 hours. HbA1C: No results for input(s): HGBA1C in the last 72 hours. CBG: Recent Labs  Lab 04/26/18 1058  GLUCAP 102*   Lipid Profile: No results for input(s): CHOL, HDL, LDLCALC, TRIG, CHOLHDL, LDLDIRECT in the last 72 hours. Thyroid Function Tests: No results for input(s): TSH, T4TOTAL, FREET4, T3FREE, THYROIDAB in the last 72 hours. Anemia Panel: No results for input(s): VITAMINB12, FOLATE, FERRITIN, TIBC, IRON, RETICCTPCT in the last 72 hours. Urine analysis:    Component Value Date/Time   COLORURINE YELLOW 04/26/2018 1028   APPEARANCEUR CLEAR 04/26/2018 1028   LABSPEC 1.011 04/26/2018 1028   PHURINE 6.0 04/26/2018 1028   GLUCOSEU NEGATIVE 04/26/2018 1028   HGBUR NEGATIVE 04/26/2018 1028   BILIRUBINUR NEGATIVE 04/26/2018 1028   BILIRUBINUR Negative 03/28/2014 1142   KETONESUR 5 (A) 04/26/2018 1028   PROTEINUR NEGATIVE 04/26/2018 1028   UROBILINOGEN 0.2 03/28/2014 1142   UROBILINOGEN 0.2 11/23/2012 1837   NITRITE NEGATIVE  04/26/2018 1028   LEUKOCYTESUR NEGATIVE 04/26/2018 1028    Radiological Exams on Admission: Mr Brain Wo Contrast (neuro Protocol)  Result Date: 04/26/2018 CLINICAL DATA:  Neuro deficits, subacute. Generalized weakness, dizziness, and decreased appetite for 2 days. EXAM: MRI HEAD WITHOUT CONTRAST TECHNIQUE: Multiplanar, multiecho pulse sequences of the brain and surrounding structures were obtained without intravenous contrast. COMPARISON:  None. FINDINGS: Brain: Moderate generalized atrophy is present. There is advanced confluent periventricular white matter disease throughout both hemispheres. Remote lacunar infarcts are present in the basal ganglia bilaterally. White matter changes extend into the brainstem. Remote lacunar infarcts are present in  the cerebellum bilaterally. No acute infarct, hemorrhage, or mass lesion is present. Ventricles are proportionate to the degree of atrophy. No significant extra-axial fluid collection is present. Vascular: Flow is present in the major intracranial arteries. Skull and upper cervical spine: The skull base is within. Craniocervical junction is. Marrow signal is normal. Sinuses/Orbits: Globes and orbits are within normal limits. Mild mucosal thickening is present in the inferior right maxillary sinus. Prominent Tornwaldt cyst present the right of midline within the nasopharynx. Mastoid air cells are clear. IMPRESSION: 1. No acute intracranial abnormality. 2. Advanced diffuse confluent white matter disease likely reflects the sequelae of chronic microvascular ischemia. 3. Minimal sinus disease. Electronically Signed   By: San Morelle [name]   On: 04/26/2018 15:48    EKG: Independently reviewed.  Vent. rate 48 BPM PR interval * ms QRS duration 85 ms QT/QTc 430/385 ms P-R-T axes 78 16 99 Sinus bradycardia Atrial premature complex Repol abnrm suggests ischemia, anterolateral  Assessment/Plan Principal Problem:   Ataxia Observation/telemetry. Fall  precautions. Gentle IV fluids. PT/OT evaluation in a.m. Care management/social services evaluation in a.m.  Active Problems:   SVT (supraventricular tachycardia) (HCC) Resume beta-blocker. Keep electrolytes optimized.    Diabetes mellitus type 2 with complications (HCC) Carbohydrate modified diet. Continue metformin. CBG monitoring before meals and bedtime.    HYPERCHOLESTEROLEMIA Continue simvastatin 40 mg p.o. daily. Check LFTs daily.    COPD (chronic obstructive pulmonary disease) (HCC) Supplemental oxygen and bronchodilators as needed.    Barrett's esophagus Continue PPI. Pantoprazole 40 mg p.o. daily.    Supraventricular tachycardia ([place]) Secondary to beta-blocker withdrawal. Continue beta-blocker.    CAD (coronary artery disease) Continue beta blocker, ASA and statin.    Tobacco use Nicotine replacement therapy declined. Nurse to provide tobacco cessation information.    DVT prophylaxis: Lovenox SQ. Code Status: Full code. Family Communication: His son and daughter were present in the ED. Disposition Plan: Observation for ataxia and SVT cardiac monitoring. Consults called:  Admission status: Observation/Telemetry.   Reubin Milan MD [facility] Pager [phone].  If 7PM-7AM, please contact night-coverage [email or web address] Password [facility]  04/26/2018, 6:45 PM

## 2018-04-26 NOTE — Plan of Care (Signed)

## 2018-04-26 NOTE — ED Notes (Signed)
Bed: XQ82 Expected date:  Expected time:  Means of arrival:  Comments: 84 yo gen weakness

## 2018-04-26 NOTE — ED Notes (Signed)
Patient transported to MRI 

## 2018-04-26 NOTE — ED Notes (Signed)
ED TO INPATIENT HANDOFF REPORT  Name/Age/Gender Troy [name] 84 y.o. male  Code Status Code Status History    Date Active Date Inactive Code Status Order ID Comments User Context   11/24/2012 0155 11/24/2012 1305 Full Code [id number]  Samuella Cota, MD Inpatient      Home/SNF/Other Home  Chief Complaint dizziness, weakness  Level of Care/Admitting Diagnosis ED Disposition    ED Disposition Condition [facility]: [facility] [[id number]]  Level of Care: Telemetry [5]  Admit to tele based on following criteria: Monitor for Ischemic changes  Diagnosis: Ataxia [893810]  Admitting Physician: Reubin Milan [[id number]]  Attending Physician: Reubin Milan [[id number]]  PT Class (Do Not Modify): Observation [104]  PT Acc Code (Do Not Modify): Observation [10022]       Medical History Past Medical History:  Diagnosis Date  . Aorto-iliac atherosclerosis ([place]) 08/2015   by xray  . Barrett's esophagus    EGD 2012, rpt 2014, longterm PPI  . Chronic idiopathic constipation 07/17/2010  . Chronic obstructive pulmonary disease (COPD) (HCC)    spirometry with moderate COPD  . Coronary atherosclerosis of artery bypass graft   . Diabetes mellitus type II ~2008  . Gastrointestinal ulcer 04/2011   with hematemesis s/p EGD by [facility]  . GERD (gastroesophageal reflux disease)   . HLD (hyperlipidemia)   . HNP (herniated nucleus pulposus), lumbar 2013   with compression of L5 nerve root and foot drop, also with lumbar DDD s/p surgery  . Hypertrophy of prostate without urinary obstruction and other lower urinary tract symptoms (LUTS)   . Paroxysmal SVT (supraventricular tachycardia) ([place])    a. 3/14 => converted in ED with adenosine to AFib => NSR;  b. Echo 4/14:  Mild LVH, mild FBSH, EF 55-60%, Gr 1 DD  . Prostate nodule ~2009   Left-benign s/p eval Uro WNL ([place])  . Thrombocytopenia ([place])   . Tobacco use disorder      Allergies Allergies  Allergen Reactions  . Codeine     REACTION: vomiting  . Cyclobenzaprine Hcl     REACTION: vomiting  . Hydrocodone Nausea Only and Other (See Comments)    "crazy"  . Tramadol Other (See Comments)    woozy  . Valium Other (See Comments)    sedation    IV Location/Drains/Wounds Patient Lines/Drains/Airways Status   Active Line/Drains/Airways    Name:   Placement date:   Placement time:   Site:   Days:   Peripheral IV 04/26/18 Right Wrist   04/26/18    1038    Wrist   less than 1   Incision 01/31/12 Back Bilateral   01/31/12    2138     2277          Labs/Imaging Results for orders placed or performed during the hospital encounter of 04/26/18 (from the past 48 hour(s))  Urinalysis, Routine w reflex microscopic     Status: Abnormal   Collection Time: 04/26/18 10:28 AM  Result Value Ref Range   Color, Urine YELLOW YELLOW   APPearance CLEAR CLEAR   Specific Gravity, Urine 1.011 1.005 - 1.030   pH 6.0 5.0 - 8.0   Glucose, UA NEGATIVE NEGATIVE mg/dL   Hgb urine dipstick NEGATIVE NEGATIVE   Bilirubin Urine NEGATIVE NEGATIVE   Ketones, ur 5 (A) NEGATIVE mg/dL   Protein, ur NEGATIVE NEGATIVE mg/dL   Nitrite NEGATIVE NEGATIVE   Leukocytes, UA NEGATIVE NEGATIVE    Comment: Performed at  [facility], [place] [address], [place], [place] [address]  Basic metabolic panel     Status: Abnormal   Collection Time: 04/26/18 10:57 AM  Result Value Ref Range   Sodium 141 135 - 145 mmol/L   Potassium 4.5 3.5 - 5.1 mmol/L   Chloride 108 98 - 111 mmol/L   CO2 23 22 - 32 mmol/L   Glucose, Bld 105 (H) 70 - 99 mg/dL   BUN 30 (H) 8 - 23 mg/dL   Creatinine, Ser 1.28 (H) 0.61 - 1.24 mg/dL   Calcium 9.9 8.9 - 10.3 mg/dL   GFR calc non Af Amer 50 (L) >60 mL/min   GFR calc Af Amer 58 (L) >60 mL/min    Comment: (NOTE) The eGFR has been calculated using the CKD EPI equation. This calculation has not been validated in all clinical situations. eGFR's  persistently <60 mL/min signify possible Chronic Kidney Disease.    Anion gap 10 5 - 15    Comment: Performed at [facility], [place] [address] [place], [place] [address]  CBC     Status: None   Collection Time: 04/26/18 10:57 AM  Result Value Ref Range   WBC 6.7 4.0 - 10.5 K/uL   RBC 4.80 4.22 - 5.81 MIL/uL   Hemoglobin 13.6 13.0 - 17.0 g/dL   HCT 41.1 39.0 - 52.0 %   MCV 85.6 78.0 - 100.0 fL   MCH 28.3 26.0 - 34.0 pg   MCHC 33.1 30.0 - 36.0 g/dL   RDW 15.2 11.5 - 15.5 %   Platelets 191 150 - 400 K/uL    Comment: Performed at [facility], [place] [address], [place], [place] [address]  Magnesium     Status: None   Collection Time: 04/26/18 10:57 AM  Result Value Ref Range   Magnesium 2.2 1.7 - 2.4 mg/dL    Comment: Performed at [facility], [place] [address] [place], [place] [address]  CBG monitoring, ED     Status: Abnormal   Collection Time: 04/26/18 10:58 AM  Result Value Ref Range   Glucose-Capillary 102 (H) 70 - 99 mg/dL  I-Stat Troponin, ED (not at [facility])     Status: None   Collection Time: 04/26/18  1:12 PM  Result Value Ref Range   Troponin i, poc 0.01 0.00 - 0.08 ng/mL   Comment 3            Comment: Due to the release kinetics of cTnI, a negative result within the first hours of the onset of symptoms does not rule out myocardial infarction with certainty. If myocardial infarction is still suspected, repeat the test at appropriate intervals.    Mr Brain Wo Contrast (neuro Protocol)  Result Date: 04/26/2018 CLINICAL DATA:  Neuro deficits, subacute. Generalized weakness, dizziness, and decreased appetite for 2 days. EXAM: MRI HEAD WITHOUT CONTRAST TECHNIQUE: Multiplanar, multiecho pulse sequences of the brain and surrounding structures were obtained without intravenous contrast. COMPARISON:  None. FINDINGS: Brain: Moderate generalized atrophy is present. There is advanced confluent periventricular white matter disease  throughout both hemispheres. Remote lacunar infarcts are present in the basal ganglia bilaterally. White matter changes extend into the brainstem. Remote lacunar infarcts are present in the cerebellum bilaterally. No acute infarct, hemorrhage, or mass lesion is present. Ventricles are proportionate to the degree of atrophy. No significant extra-axial fluid collection is present. Vascular: Flow is present in the major intracranial arteries. Skull and upper cervical spine: The skull base is within.  Craniocervical junction is. Marrow signal is normal. Sinuses/Orbits: Globes and orbits are within normal limits. Mild mucosal thickening is present in the inferior right maxillary sinus. Prominent Tornwaldt cyst present the right of midline within the nasopharynx. Mastoid air cells are clear. IMPRESSION: 1. No acute intracranial abnormality. 2. Advanced diffuse confluent white matter disease likely reflects the sequelae of chronic microvascular ischemia. 3. Minimal sinus disease. Electronically Signed   By: San Morelle [name]   On: 04/26/2018 15:48    Pending Labs Unresulted Labs (From admission, onward)    Start     Ordered   04/26/18 1244  Troponin I  STAT,   STAT     04/26/18 1243          Vitals/Pain Today's Vitals   04/26/18 1309 04/26/18 1310 04/26/18 1311 04/26/18 1312  BP:  (!) 144/119    Pulse: (!) 59 (!) 57 (!) 52 (!) 58  Resp: (!) _0 Temp:      TempSrc:      SpO2: 100% 100% 97% 100%  Weight:      Height:      PainSc:        Isolation Precautions No active isolations  Medications Medications  meclizine (ANTIVERT) tablet 25 mg (25 mg Oral Given 04/26/18 1124)  sodium chloride 0.9 % bolus 1,000 mL (1,000 mLs Intravenous New Bag/Given 04/26/18 1125)  metoprolol tartrate (LOPRESSOR) injection 5 mg (5 mg Intravenous Given 04/26/18 1248)    Mobility non-ambulatory

## 2018-04-27 ENCOUNTER — Telehealth: Payer: Self-pay

## 2018-04-27 DIAGNOSIS — E118 Type 2 diabetes mellitus with unspecified complications: Secondary | ICD-10-CM

## 2018-04-27 DIAGNOSIS — R55 Syncope and collapse: Secondary | ICD-10-CM

## 2018-04-27 DIAGNOSIS — I471 Supraventricular tachycardia: Secondary | ICD-10-CM | POA: Diagnosis not present

## 2018-04-27 DIAGNOSIS — R270 Ataxia, unspecified: Secondary | ICD-10-CM

## 2018-04-27 LAB — COMPREHENSIVE METABOLIC PANEL
ALBUMIN: 3.9 g/dL (ref 3.5–5.0)
ALK PHOS: 118 U/L (ref 38–126)
ALT: 19 U/L (ref 0–44)
ANION GAP: 9 (ref 5–15)
AST: 23 U/L (ref 15–41)
BUN: 27 mg/dL — ABNORMAL HIGH (ref 8–23)
CALCIUM: 9.4 mg/dL (ref 8.9–10.3)
CHLORIDE: 107 mmol/L (ref 98–111)
CO2: 23 mmol/L (ref 22–32)
Creatinine, Ser: 1.27 mg/dL — ABNORMAL HIGH (ref 0.61–1.24)
GFR calc Af Amer: 59 mL/min — ABNORMAL LOW (ref >60)
GFR calc non Af Amer: 51 mL/min — ABNORMAL LOW (ref >60)
GLUCOSE: 91 mg/dL (ref 70–99)
Potassium: 4.5 mmol/L (ref 3.5–5.1)
SODIUM: 139 mmol/L (ref 135–145)
Total Bilirubin: 0.9 mg/dL (ref 0.3–1.2)
Total Protein: 7.6 g/dL (ref 6.5–8.1)

## 2018-04-27 LAB — CBC WITH DIFFERENTIAL/PLATELET
BASOS PCT: 0 %
Basophils Absolute: 0 10*3/uL (ref 0.0–0.1)
EOS ABS: 0.1 10*3/uL (ref 0.0–0.7)
EOS PCT: 1 %
HCT: 38.1 % — ABNORMAL LOW (ref 39.0–52.0)
HEMOGLOBIN: 12.6 g/dL — AB (ref 13.0–17.0)
Lymphocytes Relative: 45 %
Lymphs Abs: 2.7 10*3/uL (ref 0.7–4.0)
MCH: 28.4 pg (ref 26.0–34.0)
MCHC: 33.1 g/dL (ref 30.0–36.0)
MCV: 86 fL (ref 78.0–100.0)
Monocytes Absolute: 0.5 10*3/uL (ref 0.1–1.0)
Monocytes Relative: 8 %
NEUTROS PCT: 46 %
Neutro Abs: 2.8 10*3/uL (ref 1.7–7.7)
PLATELETS: 166 10*3/uL (ref 150–400)
RBC: 4.43 MIL/uL (ref 4.22–5.81)
RDW: 15.3 % (ref 11.5–15.5)
WBC: 6.1 10*3/uL (ref 4.0–10.5)

## 2018-04-27 LAB — TROPONIN I

## 2018-04-27 LAB — GLUCOSE, CAPILLARY
GLUCOSE-CAPILLARY: 101 mg/dL — AB (ref 70–99)
Glucose-Capillary: 83 mg/dL (ref 70–99)

## 2018-04-27 MED ORDER — OMEPRAZOLE 40 MG PO CPDR: 40.0000 mg | DELAYED_RELEASE_CAPSULE | Freq: Every day | ORAL | 0 refills | Status: DC

## 2018-04-27 MED ORDER — METOPROLOL TARTRATE 25 MG PO TABS: ORAL_TABLET | ORAL | 0 refills | Status: DC

## 2018-04-27 NOTE — Evaluation (Signed)
Physical Therapy One Time Evaluation Patient Details Name: Troy [name] MRN: [id number] DOB: Jul 15, 1933 Today's Date: 04/27/2018   History of Present Illness  84 y.o. male with a past medical history significant for COPD, DM2, GI ulcer, GERD w/barett's esophagus, CAD s/pCABG, and paroxysmal SVT and pt admitted for Paroxysmal SVT and abnormality of gait likely due to episodes of SVT  Clinical Impression  Patient evaluated by Physical Therapy with no further acute PT needs identified. All education has been completed and the patient has no further questions.  Pt ambulated in hallway and denies any symptoms.  Pt appears at mobility baseline and eager for d/c home today. See below for any follow-up Physical Therapy or equipment needs. PT is signing off. Thank you for this referral.       Follow Up Recommendations No PT follow up    Equipment Recommendations  None recommended by PT    Recommendations for Other Services       Precautions / Restrictions Precautions Precautions: None      Mobility  Bed Mobility Overal bed mobility: Modified Independent                Transfers Overall transfer level: Modified independent                  Ambulation/Gait Ambulation/Gait assistance: Supervision;Modified independent (Device/Increase time) Gait Distance (Feet): 350 Feet Assistive device: None Gait Pattern/deviations: WFL(Within Functional Limits)     General Gait Details: no obvious gait abnormalities, no unsteadiness or LOB, HR 75-87 bpm during ambulation, pt denies any symptoms  Stairs            Wheelchair Mobility    Modified Rankin (Stroke Patients Only)       Balance                                             Pertinent Vitals/Pain Pain Assessment: No/denies pain    Home Living Family/patient expects to be discharged to:: Private residence Living Arrangements: Alone Available Help at Discharge: Family Type of Home:  House Home Access: Stairs to enter Entrance Stairs-Rails: None Technical brewer of Steps: 1 Home Layout: One level Home Equipment: None      Prior Function Level of Independence: Independent               Hand Dominance        Extremity/Trunk Assessment   Upper Extremity Assessment Upper Extremity Assessment: Overall WFL for tasks assessed    Lower Extremity Assessment Lower Extremity Assessment: Overall WFL for tasks assessed       Communication   Communication: No difficulties  Cognition Arousal/Alertness: Awake/alert Behavior During Therapy: WFL for tasks assessed/performed Overall Cognitive Status: Within Functional Limits for tasks assessed                                        General Comments      Exercises     Assessment/Plan    PT Assessment Patent does not need any further PT services  PT Problem List         PT Treatment Interventions      PT Goals (Current goals can be found in the Care Plan section)  Acute Rehab PT Goals PT Goal Formulation: All assessment and education complete,  DC therapy    Frequency     Barriers to discharge        Co-evaluation               AM-PAC PT "6 Clicks" Daily Activity  Outcome Measure Difficulty turning over in bed (including adjusting bedclothes, sheets and blankets)?: None Difficulty moving from lying on back to sitting on the side of the bed? : None Difficulty sitting down on and standing up from a chair with arms (e.g., wheelchair, bedside commode, etc,.)?: None Help needed moving to and from a bed to chair (including a wheelchair)?: None Help needed walking in hospital room?: None Help needed climbing 3-5 steps with a railing? : A Little 6 Click Score: 23    End of Session   Activity Tolerance: Patient tolerated treatment well Patient left: in bed;with call bell/phone within reach;with family/visitor present Nurse Communication: Mobility status PT Visit  Diagnosis: Other abnormalities of gait and mobility (R26.89)    Time: 1358-1406 PT Time Calculation (min) (ACUTE ONLY): 8 min   Charges:   PT Evaluation $PT Eval Low Complexity: 1 Low         Carmelia Bake, PT, DPT 04/27/2018 Pager: [phone]  [name] 04/27/2018, 3:15 PM

## 2018-04-27 NOTE — Progress Notes (Signed)
PROGRESS NOTE  Troy [name]  KVQ:[id number] DOB: [birth year] DOA: 04/26/2018 PCP: [name], MD   Brief Narrative:  Mr. Pischke is a 84 y.o. male with a past medical history significant for COPD, DM2, GI ulcer, GERD w/barett's esophagus, CAD s/pCABG, and paroxysmal SVT. He presented to the emergency department on 8/11 complaining of a 2 day history of dizziness, weakness, and ataxia. He felt suddenly weak on Saturday am and was unable to walk to the bathroom - he fell twice. In the ED, he was found to be in SVT in the 140-150s, which resolved with  Metoprolol. MRI of the brain showed nothing acute, only chronic microvascular ischemia. Serial troponins negative. He was admitted for observation with the working diagnosis of paroxysmal SVT and further evaluation of weakness/ataxia.  Subjective: Patient is feeling well this morning - he has no complaints. He cannot provide much history regarding his falls and weakness and the family member who was with him is not present. He has no pain. He denies any current SOB, CP, paliptations, dizziness, vertigo. He is eager to go home.  Assessment & Plan:  Paroxysmal SVT  - Likely secondary to medication non-compliance. One episode in ED, which resolved with 5mg  of metoprolol. No further evidence of SVT episodes on telemetry. - Discussed with patient the importance of compliance with beta blocker - continue 12.5 mg BID upon discharge.  Abnormality of gait / ataxia - Likely secondary to episodes of SVT. Some evidence of dehydration on admission - continue gentle IVF.  - Fall precautions - PT evaluation pending.   Diabetes Mellitus Type 2 - Appears stable. Continue metformin. Carb modified diet. - CBG monitoring 4 times daily, before meals and at bedtime.  COPD without exacerbation  - No signs of exacerbation. Continue supplemental O2 and bronchodilaters prn.  GERD w/Barrett's esophagus  - Pantoprazole 40 mg daily  DVT prophylaxis:  Heparin Code Status: FULL Family Communication: Sister in Sports coach and daughter at bedside.  Disposition Plan: Likely discharge home today after PT evaluation.   Consultants:   Physical Therapy   Procedures:   None   Antimicrobials:   None   Objective: Vitals:   04/26/18 1312 04/26/18 1841 04/26/18 2037 04/27/18 0450  BP:  (!) 158/75 (!) 147/65 (!) 150/86  Pulse: (!) 58 66 (!) 58 67  Resp: 18 20 16 16   Temp:  97.9 F (36.6 C) 97.9 F (36.6 C) 97.7 F (36.5 C)  TempSrc:  Oral Oral Oral  SpO2: 100% 100% 94% 97%  Weight:      Height:        Intake/Output Summary (Last 24 hours) at 04/27/2018 1149 Last data filed at 04/27/2018 1057 Gross per 24 hour  Intake 1399.6 ml  Output 1350 ml  Net 49.6 ml   Filed Weights   04/26/18 1032  Weight: 63.5 kg    Examination: General appearance: adult male, awake and alert, laying in bed. NAD.   HEENT: Anicteric, conjunctiva pink, mucous membranes moist. Skin: Warm and dry. No jaundice. No suspicious rashes or lesions. Cardiac: RRR, nl S1-S2, no murmurs appreciated. No JVD. No LE edema. No cyanosis. Distal pulses are intact bilaterally. Respiratory: Normal respiratory rate and rhythm. CTAB without wheezes, crackles or rales. Abdomen: Abdomen soft and non-distended. No TTP. Positive bowel sounds throughout. MSK: No deformities or effusions. Neuro: AOx3. Moves all extremities. 5/5 grip strength bilateral upper extremities. 5/5 strength bilateral lower extremities. No gross focal neurological deficits. Speech fluent.    Psych: Attention normal. Affect normal. Judgment  and insight appear normal.  Data Reviewed: I have personally reviewed following labs and imaging studies:  CBC: Recent Labs  Lab 04/26/18 1057 04/27/18 0723  WBC 6.7 6.1  NEUTROABS  --  2.8  HGB 13.6 12.6*  HCT 41.1 38.1*  MCV 85.6 86.0  PLT 191 193   Basic Metabolic Panel: Recent Labs  Lab 04/26/18 1057 04/27/18 0723  NA 141 139  K 4.5 4.5  CL 108 107   CO2 23 23  GLUCOSE 105* 91  BUN 30* 27*  CREATININE 1.28* 1.27*  CALCIUM 9.9 9.4  MG 2.2  --    GFR: Estimated Creatinine Clearance: 40.3 mL/min (A) (by C-G formula based on SCr of 1.27 mg/dL (H)). Liver Function Tests: Recent Labs  Lab 04/27/18 0723  AST 23  ALT 19  ALKPHOS 118  BILITOT 0.9  PROT 7.6  ALBUMIN 3.9   Cardiac Enzymes: Recent Labs  Lab 04/26/18 1911 04/27/18 0055  TROPONINI <0.03 <0.03  CBG: Recent Labs  Lab 04/26/18 1058 04/26/18 2104 04/27/18 0713  GLUCAP 102* 100* 83   Urine analysis:    Component Value Date/Time   COLORURINE YELLOW 04/26/2018 1028   APPEARANCEUR CLEAR 04/26/2018 1028   LABSPEC 1.011 04/26/2018 1028   PHURINE 6.0 04/26/2018 1028   GLUCOSEU NEGATIVE 04/26/2018 1028   HGBUR NEGATIVE 04/26/2018 1028   BILIRUBINUR NEGATIVE 04/26/2018 1028   BILIRUBINUR Negative 03/28/2014 1142   KETONESUR 5 (A) 04/26/2018 1028   PROTEINUR NEGATIVE 04/26/2018 1028   UROBILINOGEN 0.2 03/28/2014 1142   UROBILINOGEN 0.2 11/23/2012 1837   NITRITE NEGATIVE 04/26/2018 1028   LEUKOCYTESUR NEGATIVE 04/26/2018 1028   Radiology Studies: Mr Brain Wo Contrast (neuro Protocol)  Result Date: 04/26/2018 CLINICAL DATA:  Neuro deficits, subacute. Generalized weakness, dizziness, and decreased appetite for 2 days. EXAM: MRI HEAD WITHOUT CONTRAST TECHNIQUE: Multiplanar, multiecho pulse sequences of the brain and surrounding structures were obtained without intravenous contrast. COMPARISON:  None. FINDINGS: Brain: Moderate generalized atrophy is present. There is advanced confluent periventricular white matter disease throughout both hemispheres. Remote lacunar infarcts are present in the basal ganglia bilaterally. White matter changes extend into the brainstem. Remote lacunar infarcts are present in the cerebellum bilaterally. No acute infarct, hemorrhage, or mass lesion is present. Ventricles are proportionate to the degree of atrophy. No significant extra-axial  fluid collection is present. Vascular: Flow is present in the major intracranial arteries. Skull and upper cervical spine: The skull base is within. Craniocervical junction is. Marrow signal is normal. Sinuses/Orbits: Globes and orbits are within normal limits. Mild mucosal thickening is present in the inferior right maxillary sinus. Prominent Tornwaldt cyst present the right of midline within the nasopharynx. Mastoid air cells are clear. IMPRESSION: 1. No acute intracranial abnormality. 2. Advanced diffuse confluent white matter disease likely reflects the sequelae of chronic microvascular ischemia. 3. Minimal sinus disease. Electronically Signed   By: San Morelle [name]   On: 04/26/2018 15:48   Scheduled Meds: . aspirin  81 mg Oral Daily  . fluticasone  2 spray Each Nare Daily  . heparin  5,000 Units Subcutaneous Q8H  . metFORMIN  500 mg Oral BID WC  . metoprolol tartrate  12.5 mg Oral BID  . nystatin   Topical TID  . pantoprazole  40 mg Oral Daily  . simvastatin  40 mg Oral QHS    LOS: 0 days   Quinisha Mould, PA-S [facility] 04/27/2018, 11:49 AM   [email or web address] Password [email or web address] If 7PM-7AM, please contact night-coverage

## 2018-04-27 NOTE — Telephone Encounter (Signed)
Elenore Rota pts son (DPR signed) came by office; pt is still in [place] is requesting 5 day supply of pts meds. I explained that since pt is still in hospital will need to wait until discharged to make sure meds are not changed upon discharge. Also Elenore Rota is not sure what med pt is out of at pts home. Elenore Rota has already ordered pts med from mail order. Elenore Rota will wait until discharge and find out what med is needed and Elenore Rota will cb. Nothing further needed at this time.

## 2018-04-27 NOTE — Care Management Obs Status (Signed)
[place] NOTIFICATION   Patient Details  Name: DAMEIR GENTZLER MRN: [id number] Date of Birth: [birth year]   Medicare Observation Status Notification Given:       Purcell Mouton, RN 04/27/2018, 1:10 PM

## 2018-04-27 NOTE — Discharge Summary (Signed)
Physician Discharge Summary  Troy [name]  QKM:[id number]  DOB: Jul 02, 1933  DOA: 04/26/2018 PCP: [name], MD  Admit date: 04/26/2018 Discharge date: 04/27/2018  Admitted From: Home Disposition: Home  Recommendations for Outpatient Follow-up:  1. Follow up with PCP in 1 week 2. Follow-up with cardiology in 2 to 4 weeks. 3. Obtain CBC and BMP to monitor electrolytes and hemoglobin  Discharge Condition: Stable CODE STATUS: Full code Diet recommendation: Heart Healthy  Brief/Interim Summary: For full details see H&P/Progress note, but in brief, Troy [name] is a 84 year old male with past medical history significant for COPD, diabetes mellitus type 2, paroxysmal SVT, CAD status post CABG, GI ulcers and GERD who presented to the emergency department on 8/11 complaining of a 2-day history of weakness and dizziness.  2 days prior to admission patient felt weak and felt suddenly.,  On the day of admission patient fell twice prompting him to come to the emergency department.  Upon ED evaluation he was found to be on SBT with heart rate of 140s to 150s, resolved with IV metoprolol.  MRI of the brain was performed with no acute findings other than microvascular ischemia.  Serial troponins were negative.  Patient was admitted with working diagnosis of SVT and evaluation of weakness.  Patient was placed on telemetry monitor over 24 hours.  No recurrent episodes after starting metoprolol.  Patient was evaluated by physical therapy who has no recommendations patient was ambulatory with no needs.  Heart rate remained stable during ambulation.  Given patient returned to baseline and clinically improved was deemed stable for discharge.  Subjective: No complaints.  Discharge Diagnoses/Hospital Course:  Paroxysmal SVT Felt to be secondary to medication noncompliance, patient ran out of metoprolol.  After resuming metoprolol in the ED patient was back to normal sinus rhythm.  Telemetry with no  evidence of further SVT episodes.  Continue metoprolol 12.5 mg twice daily and follow-up with PCP.  Syncope  Cardiac driven, due to SVT.  Unclear how long patient was with SVT as patient was not taking metoprolol.  MRI of the brain was negative.  Weakness has resolved with rhythm control.  Patient is back to baseline.  Evaluated by PT and has no physical therapy needs.  No further work-up at this time.  Follow-up with PCP and cardiology  Diabetes mellitus type 2 Stable during hospital stay, continue home medications with no changes  COPD Stable  CAD Status post CABG, asymptomatic Continue home medications with no changes  All other chronic medical condition were stable during the hospitalization.  Patient was seen by physical therapy, no further recommendations On the day of the discharge the patient's vitals were stable, and no other acute medical condition were reported by patient. the patient was felt safe to be discharge to home.   Discharge Instructions  You were cared for by a hospitalist during your hospital stay. If you have any questions about your discharge medications or the care you received while you were in the hospital after you are discharged, you can call the unit and asked to speak with the hospitalist on call if the hospitalist that took care of you is not available. Once you are discharged, your primary care physician will handle any further medical issues. Please note that NO REFILLS for any discharge medications will be authorized once you are discharged, as it is imperative that you return to your primary care physician (or establish a relationship with a primary care physician if you do not have one)  for your aftercare needs so that they can reassess your need for medications and monitor your lab values.  Discharge Instructions    Call MD for:  difficulty breathing, headache or visual disturbances   Complete by:  As directed    Call MD for:  extreme fatigue    Complete by:  As directed    Call MD for:  hives   Complete by:  As directed    Call MD for:  persistant dizziness or light-headedness   Complete by:  As directed    Call MD for:  persistant nausea and vomiting   Complete by:  As directed    Call MD for:  redness, tenderness, or signs of infection (pain, swelling, redness, odor or green/yellow discharge around incision site)   Complete by:  As directed    Call MD for:  severe uncontrolled pain   Complete by:  As directed    Call MD for:  temperature >100.4   Complete by:  As directed    Diet - low sodium heart healthy   Complete by:  As directed    Increase activity slowly   Complete by:  As directed      Allergies as of 04/27/2018      Reactions   Codeine    REACTION: vomiting   Cyclobenzaprine Hcl    REACTION: vomiting   Hydrocodone Nausea Only, Other (See Comments)   "crazy"   Tramadol Other (See Comments)   woozy   Valium Other (See Comments)   sedation      Medication List    STOP taking these medications   albuterol 108 (90 Base) MCG/ACT inhaler Commonly known as:  PROVENTIL HFA;VENTOLIN HFA   fluconazole 150 MG tablet Commonly known as:  DIFLUCAN     TAKE these medications   aspirin 81 MG tablet Take 81 mg by mouth daily.   fluticasone 50 MCG/ACT nasal spray Commonly known as:  FLONASE Place 2 sprays into both nostrils daily.   isosorbide mononitrate 30 MG 24 hr tablet Commonly known as:  IMDUR Take 0.5 tablets (15 mg total) by mouth daily.   magnesium hydroxide 400 MG/5ML suspension Commonly known as:  MILK OF MAGNESIA Take 15 mLs by mouth daily as needed for moderate constipation.   metFORMIN 500 MG tablet Commonly known as:  GLUCOPHAGE TAKE 1 TABLET BY MOUTH TWO  TIMES DAILY WITH MEALS   metoprolol tartrate 25 MG tablet Commonly known as:  LOPRESSOR TAKE ONE-HALF TABLET BY  MOUTH TWO TIMES DAILY   nystatin powder Commonly known as:  MYCOSTATIN/NYSTOP Apply topically 3 (three) times  daily.   omeprazole 40 MG capsule Commonly known as:  PRILOSEC Take 1 capsule (40 mg total) by mouth daily.   simvastatin 40 MG tablet Commonly known as:  ZOCOR Take 1 tablet (40 mg total) by mouth at bedtime.      Follow-up Information    [name], MD. Schedule an appointment as soon as possible for a visit in 1 week(s).   Specialty:  Family Medicine Why:  Hospital follow-up Contact information: [place] [address] [phone]          Allergies  Allergen Reactions  . Codeine     REACTION: vomiting  . Cyclobenzaprine Hcl     REACTION: vomiting  . Hydrocodone Nausea Only and Other (See Comments)    "crazy"  . Tramadol Other (See Comments)    woozy  . Valium Other (See Comments)    sedation  Consultations:  None    Procedures/Studies: Mr Brain Wo Contrast (neuro Protocol)  Result Date: 04/26/2018 CLINICAL DATA:  Neuro deficits, subacute. Generalized weakness, dizziness, and decreased appetite for 2 days. EXAM: MRI HEAD WITHOUT CONTRAST TECHNIQUE: Multiplanar, multiecho pulse sequences of the brain and surrounding structures were obtained without intravenous contrast. COMPARISON:  None. FINDINGS: Brain: Moderate generalized atrophy is present. There is advanced confluent periventricular white matter disease throughout both hemispheres. Remote lacunar infarcts are present in the basal ganglia bilaterally. White matter changes extend into the brainstem. Remote lacunar infarcts are present in the cerebellum bilaterally. No acute infarct, hemorrhage, or mass lesion is present. Ventricles are proportionate to the degree of atrophy. No significant extra-axial fluid collection is present. Vascular: Flow is present in the major intracranial arteries. Skull and upper cervical spine: The skull base is within. Craniocervical junction is. Marrow signal is normal. Sinuses/Orbits: Globes and orbits are within normal limits. Mild mucosal thickening  is present in the inferior right maxillary sinus. Prominent Tornwaldt cyst present the right of midline within the nasopharynx. Mastoid air cells are clear. IMPRESSION: 1. No acute intracranial abnormality. 2. Advanced diffuse confluent white matter disease likely reflects the sequelae of chronic microvascular ischemia. 3. Minimal sinus disease. Electronically Signed   By: San Morelle [name]   On: 04/26/2018 15:48     Discharge Exam: Vitals:   04/27/18 0450 04/27/18 1348  BP: (!) 150/86 115/67  Pulse: 67 69  Resp: 16 (!) 21  Temp: 97.7 F (36.5 C) 98 F (36.7 C)  SpO2: 97% 98%   Vitals:   04/26/18 1841 04/26/18 2037 04/27/18 0450 04/27/18 1348  BP: (!) 158/75 (!) 147/65 (!) 150/86 115/67  Pulse: 66 (!) 58 67 69  Resp: 20 16 16  (!) 21  Temp: 97.9 F (36.6 C) 97.9 F (36.6 C) 97.7 F (36.5 C) 98 F (36.7 C)  TempSrc: Oral Oral Oral Oral  SpO2: 100% 94% 97% 98%  Weight:      Height:        General: Pt is alert, awake, not in acute distress Cardiovascular: RRR, S1/S2 +, no rubs, no gallops Respiratory: CTA bilaterally, no wheezing, no rhonchi Abdominal: Soft, NT, ND, bowel sounds + Extremities: no edema, strength 5/5   The results of significant diagnostics from this hospitalization (including imaging, microbiology, ancillary and laboratory) are listed below for reference.     Microbiology: No results found for this or any previous visit (from the past 240 hour(s)).   Labs: BNP (last 3 results) No results for input(s): BNP in the last 8760 hours. Basic Metabolic Panel: Recent Labs  Lab 04/26/18 1057 04/27/18 0723  NA 141 139  K 4.5 4.5  CL 108 107  CO2 23 23  GLUCOSE 105* 91  BUN 30* 27*  CREATININE 1.28* 1.27*  CALCIUM 9.9 9.4  MG 2.2  --    Liver Function Tests: Recent Labs  Lab 04/27/18 0723  AST 23  ALT 19  ALKPHOS 118  BILITOT 0.9  PROT 7.6  ALBUMIN 3.9   No results for input(s): LIPASE, AMYLASE in the last 168 hours. No results for  input(s): AMMONIA in the last 168 hours. CBC: Recent Labs  Lab 04/26/18 1057 04/27/18 0723  WBC 6.7 6.1  NEUTROABS  --  2.8  HGB 13.6 12.6*  HCT 41.1 38.1*  MCV 85.6 86.0  PLT 191 166   Cardiac Enzymes: Recent Labs  Lab 04/26/18 1911 04/27/18 0055  TROPONINI <0.03 <0.03   BNP: Invalid input(s): POCBNP CBG:  Recent Labs  Lab 04/26/18 1058 04/26/18 2104 04/27/18 0713 04/27/18 1138  GLUCAP 102* 100* 83 101*   D-Dimer No results for input(s): DDIMER in the last 72 hours. Hgb A1c No results for input(s): HGBA1C in the last 72 hours. Lipid Profile No results for input(s): CHOL, HDL, LDLCALC, TRIG, CHOLHDL, LDLDIRECT in the last 72 hours. Thyroid function studies No results for input(s): TSH, T4TOTAL, T3FREE, THYROIDAB in the last 72 hours.  Invalid input(s): FREET3 Anemia work up No results for input(s): VITAMINB12, FOLATE, FERRITIN, TIBC, IRON, RETICCTPCT in the last 72 hours. Urinalysis    Component Value Date/Time   COLORURINE YELLOW 04/26/2018 1028   APPEARANCEUR CLEAR 04/26/2018 1028   LABSPEC 1.011 04/26/2018 1028   PHURINE 6.0 04/26/2018 1028   GLUCOSEU NEGATIVE 04/26/2018 1028   HGBUR NEGATIVE 04/26/2018 1028   BILIRUBINUR NEGATIVE 04/26/2018 1028   BILIRUBINUR Negative 03/28/2014 1142   KETONESUR 5 (A) 04/26/2018 1028   PROTEINUR NEGATIVE 04/26/2018 1028   UROBILINOGEN 0.2 03/28/2014 1142   UROBILINOGEN 0.2 11/23/2012 1837   NITRITE NEGATIVE 04/26/2018 1028   LEUKOCYTESUR NEGATIVE 04/26/2018 1028   Sepsis Labs Invalid input(s): PROCALCITONIN,  WBC,  LACTICIDVEN Microbiology No results found for this or any previous visit (from the past 240 hour(s)).   Time coordinating discharge: 32 minutes  SIGNED:  Chipper Oman, MD  [facility] 04/27/2018, 5:36 PM  Pager please text page via  [email or web address]  Note - This record has been created using [employer]. Chart creation errors have been sought, but may not always have been located. Such  creation errors do not reflect on the standard of medical care.

## 2018-04-28 ENCOUNTER — Telehealth: Payer: Self-pay | Admitting: *Deleted

## 2018-04-28 NOTE — Telephone Encounter (Signed)
Lm requesting return call to complete TCM and confirm hosp f/u appt  

## 2018-04-29 ENCOUNTER — Telehealth: Payer: Self-pay | Admitting: *Deleted

## 2018-04-29 NOTE — Telephone Encounter (Signed)
Lm requesting return call to complete TCM and confirm hosp f/u appt  

## 2018-05-05 ENCOUNTER — Ambulatory Visit (INDEPENDENT_AMBULATORY_CARE_PROVIDER_SITE_OTHER): Payer: Medicare Other | Admitting: Family Medicine

## 2018-05-05 ENCOUNTER — Encounter: Payer: Self-pay | Admitting: Family Medicine

## 2018-05-05 VITALS — BP 140/62 | HR 83 | Temp 97.8°F | Ht 70.0 in | Wt 161.2 lb

## 2018-05-05 DIAGNOSIS — J449 Chronic obstructive pulmonary disease, unspecified: Secondary | ICD-10-CM

## 2018-05-05 DIAGNOSIS — I471 Supraventricular tachycardia, unspecified: Secondary | ICD-10-CM

## 2018-05-05 DIAGNOSIS — F17200 Nicotine dependence, unspecified, uncomplicated: Secondary | ICD-10-CM | POA: Diagnosis not present

## 2018-05-05 DIAGNOSIS — Z8679 Personal history of other diseases of the circulatory system: Secondary | ICD-10-CM | POA: Diagnosis not present

## 2018-05-05 DIAGNOSIS — K5904 Chronic idiopathic constipation: Secondary | ICD-10-CM

## 2018-05-05 DIAGNOSIS — K2270 Barrett's esophagus without dysplasia: Secondary | ICD-10-CM

## 2018-05-05 DIAGNOSIS — K279 Peptic ulcer, site unspecified, unspecified as acute or chronic, without hemorrhage or perforation: Secondary | ICD-10-CM | POA: Diagnosis not present

## 2018-05-05 DIAGNOSIS — I10 Essential (primary) hypertension: Secondary | ICD-10-CM

## 2018-05-05 NOTE — Assessment & Plan Note (Signed)
Now resolved. Reviewed importance of not missing metoprolol dose. He states he has all needed medications to last until next appointment - verified with med list in our system. H/o similar SVT episode 2014.

## 2018-05-05 NOTE — Assessment & Plan Note (Signed)
Continue PPI daily. 

## 2018-05-05 NOTE — Assessment & Plan Note (Signed)
Recommended QOD milk of magnesia dosing.

## 2018-05-05 NOTE — Progress Notes (Signed)
BP 140/62 (BP Location: Left Arm, Patient Position: Sitting, Cuff Size: Normal)   Pulse 83   Temp 97.8 F (36.6 C) (Oral)   Ht 5\' 10"  (1.778 m)   Wt 161 lb 4 oz (73.1 kg)   SpO2 97%   BMI 23.14 kg/m    CC: hosp f/u visit Subjective:    Patient ID: Troy [name], male    DOB: 06/16/1933, 84 y.o.   MRN: [id number]  HPI: Troy [name] is a 84 y.o. male presenting on 05/05/2018 for Hospitalization Follow-up (Admitted to [facility] on 04/26/18, dx Ataxia.)   Recent hospitalization for weakness/dizziness and ataxia leading to 2 separate falls. Found to be in SVT with heart rate up to 150s, resolved with IV metoprolol. MRI brain was unrevealing except for microvascular ischemia. Serial troponins normal. He had run out of metoprolol 12.5mg  bid - thought this contributed to SVT.   Sees son and family daily. Denies  PT eval in hospital - no need for [facility] identified  Endorses intermittent constipation managed with milk of magnesia Misses wife. Rarely feels depressed mood, but denies feeling overwhelmed with grief or depression. Declines evaluation or treatment.   TCM hosp f/u phone call - did not complete as pt did not return 2 calls to schedule Admit date: 04/26/2018 Discharge date: 04/27/2018  Admitted From: Home Disposition: Home  Recommendations for Outpatient Follow-up:  1. Follow up with PCP in 1 week 2. Follow-up with cardiology in 2 to 4 weeks. 3. Obtain CBC and BMP to monitor electrolytes and hemoglobin  Discharge Condition: Stable CODE STATUS: Full code Diet recommendation: Heart Healthy  Relevant past medical, surgical, family and social history reviewed and updated as indicated. Interim medical history since our last visit reviewed. Allergies and medications reviewed and updated. Discharge medications reconciled with current medication list.  Outpatient Medications Prior to Visit  Medication Sig Dispense Refill  . aspirin 81 MG tablet Take 81 mg by mouth daily.     .  fluticasone (FLONASE) 50 MCG/ACT nasal spray Place 2 sprays into both nostrils daily. 16 g 1  . isosorbide mononitrate (IMDUR) 30 MG 24 hr tablet Take 0.5 tablets (15 mg total) by mouth daily. 45 tablet 4  . magnesium hydroxide (MILK OF MAGNESIA) 400 MG/5ML suspension Take 15 mLs by mouth daily as needed for moderate constipation. 360 mL 0  . metFORMIN (GLUCOPHAGE) 500 MG tablet TAKE 1 TABLET BY MOUTH TWO  TIMES DAILY WITH MEALS 180 tablet 4  . metoprolol tartrate (LOPRESSOR) 25 MG tablet TAKE ONE-HALF TABLET BY  MOUTH TWO TIMES DAILY 90 tablet 0  . nystatin (MYCOSTATIN/NYSTOP) powder Apply topically 3 (three) times daily. 45 g 0  . omeprazole (PRILOSEC) 40 MG capsule Take 1 capsule (40 mg total) by mouth daily. 90 capsule 0  . simvastatin (ZOCOR) 40 MG tablet Take 1 tablet (40 mg total) by mouth at bedtime. 90 tablet 4   No facility-administered medications prior to visit.      Per HPI unless specifically indicated in ROS section below Review of Systems     Objective:    BP 140/62 (BP Location: Left Arm, Patient Position: Sitting, Cuff Size: Normal)   Pulse 83   Temp 97.8 F (36.6 C) (Oral)   Ht 5\' 10"  (1.778 m)   Wt 161 lb 4 oz (73.1 kg)   SpO2 97%   BMI 23.14 kg/m   Wt Readings from Last 3 Encounters:  05/05/18 161 lb 4 oz (73.1 kg)  04/26/18 140 lb (63.5  kg)  03/24/18 164 lb 8 oz (74.6 kg)    Physical Exam  Constitutional: He appears well-developed and well-nourished. No distress.  HENT:  Mouth/Throat: Oropharynx is clear and moist. No oropharyngeal exudate.  Eyes: Pupils are equal, round, and reactive to light. EOM are normal.  Cardiovascular: Normal rate, regular rhythm and normal heart sounds.  No murmur heard. Pulmonary/Chest: Effort normal. No respiratory distress. He has decreased breath sounds. He has no wheezes. He has rhonchi. He has no rales.  Coarse throughout  Musculoskeletal: He exhibits no edema.  Skin: No rash noted.  Psychiatric: He has a normal mood and  affect.  Nursing note and vitals reviewed.  Lab Results  Component Value Date   CREATININE 1.27 (H) 04/27/2018   BUN 27 (H) 04/27/2018   NA 139 04/27/2018   K 4.5 04/27/2018   CL 107 04/27/2018   CO2 23 04/27/2018    Lab Results  Component Value Date   WBC 6.1 04/27/2018   HGB 12.6 (L) 04/27/2018   HCT 38.1 (L) 04/27/2018   MCV 86.0 04/27/2018   PLT 166 04/27/2018       Assessment & Plan:   Problem List Items Addressed This Visit    Supraventricular tachycardia ([place]) - Primary    Now resolved. Reviewed importance of not missing metoprolol dose. He states he has all needed medications to last until next appointment - verified with med list in our system. H/o similar SVT episode 2014.       Smoker    Continued smoker. Not likely to quit.       PUD (peptic ulcer disease)    H/o this with hematemesis 04/2011. Continue daily PPI.       Hypertension    Chronic, stable. Continue current regimen including metoprolol 12.5mg  bid, imdur 15mg  daily      COPD (chronic obstructive pulmonary disease) (HCC)    Chronic, not in acute flare. Not taking any respiratory medication. Don't anticipate any improvement with continued smoking.       Chronic idiopathic constipation    Recommended QOD milk of magnesia dosing.       Barrett's esophagus    Continue PPI daily.          No orders of the defined types were placed in this encounter.  No orders of the defined types were placed in this encounter.   Follow up plan: No follow-ups on file.  [name], MD

## 2018-05-05 NOTE — Assessment & Plan Note (Signed)
Chronic, not in acute flare. Not taking any respiratory medication. Don't anticipate any improvement with continued smoking.

## 2018-05-05 NOTE — Assessment & Plan Note (Signed)
H/o this with hematemesis 04/2011. Continue daily PPI.

## 2018-05-05 NOTE — Assessment & Plan Note (Signed)
Chronic, stable. Continue current regimen including metoprolol 12.5mg  bid, imdur 15mg  daily

## 2018-05-05 NOTE — Assessment & Plan Note (Signed)
Continued smoker. Not likely to quit.

## 2018-05-05 NOTE — Patient Instructions (Addendum)
Try milk of magnesia every other day to keep stools regularly, hold for diarrhea.  Continue current medicines. I'm gald you're doing better. Good to see you today.  Return in October for wellness visit and physical.

## 2018-06-29 ENCOUNTER — Other Ambulatory Visit: Payer: Self-pay | Admitting: Family Medicine

## 2018-06-29 DIAGNOSIS — E7800 Pure hypercholesterolemia, unspecified: Secondary | ICD-10-CM

## 2018-06-29 DIAGNOSIS — E118 Type 2 diabetes mellitus with unspecified complications: Secondary | ICD-10-CM

## 2018-06-29 DIAGNOSIS — E010 Iodine-deficiency related diffuse (endemic) goiter: Secondary | ICD-10-CM

## 2018-06-29 DIAGNOSIS — D696 Thrombocytopenia, unspecified: Secondary | ICD-10-CM

## 2018-06-30 ENCOUNTER — Ambulatory Visit (INDEPENDENT_AMBULATORY_CARE_PROVIDER_SITE_OTHER): Payer: Medicare Other

## 2018-06-30 ENCOUNTER — Encounter: Payer: Self-pay | Admitting: Family Medicine

## 2018-06-30 ENCOUNTER — Ambulatory Visit (INDEPENDENT_AMBULATORY_CARE_PROVIDER_SITE_OTHER): Payer: Medicare Other | Admitting: Family Medicine

## 2018-06-30 ENCOUNTER — Ambulatory Visit (INDEPENDENT_AMBULATORY_CARE_PROVIDER_SITE_OTHER)
Admission: RE | Admit: 2018-06-30 | Discharge: 2018-06-30 | Disposition: A | Discharge status: Home / Self Care | Payer: Medicare Other | Source: Ambulatory Visit | Attending: Family Medicine | Admitting: Family Medicine

## 2018-06-30 VITALS — BP 138/70 | HR 90 | Temp 98.3°F | Ht 70.5 in | Wt 162.8 lb

## 2018-06-30 DIAGNOSIS — J449 Chronic obstructive pulmonary disease, unspecified: Secondary | ICD-10-CM | POA: Diagnosis not present

## 2018-06-30 DIAGNOSIS — Z720 Tobacco use: Secondary | ICD-10-CM

## 2018-06-30 DIAGNOSIS — E7800 Pure hypercholesterolemia, unspecified: Secondary | ICD-10-CM | POA: Diagnosis not present

## 2018-06-30 DIAGNOSIS — D696 Thrombocytopenia, unspecified: Secondary | ICD-10-CM | POA: Diagnosis not present

## 2018-06-30 DIAGNOSIS — J441 Chronic obstructive pulmonary disease with (acute) exacerbation: Secondary | ICD-10-CM

## 2018-06-30 DIAGNOSIS — E010 Iodine-deficiency related diffuse (endemic) goiter: Secondary | ICD-10-CM | POA: Diagnosis not present

## 2018-06-30 DIAGNOSIS — R413 Other amnesia: Secondary | ICD-10-CM

## 2018-06-30 DIAGNOSIS — E118 Type 2 diabetes mellitus with unspecified complications: Secondary | ICD-10-CM

## 2018-06-30 LAB — CBC WITH DIFFERENTIAL/PLATELET
BASOS ABS: 0 10*3/uL (ref 0.0–0.1)
Basophils Relative: 0.3 % (ref 0.0–3.0)
Eosinophils Absolute: 0.1 10*3/uL (ref 0.0–0.7)
Eosinophils Relative: 0.6 % (ref 0.0–5.0)
HEMATOCRIT: 37.1 % — AB (ref 39.0–52.0)
HEMOGLOBIN: 12.2 g/dL — AB (ref 13.0–17.0)
LYMPHS ABS: 1.8 10*3/uL (ref 0.7–4.0)
Lymphocytes Relative: 20.4 % (ref 12.0–46.0)
MCHC: 32.8 g/dL (ref 30.0–36.0)
MCV: 88.3 fl (ref 78.0–100.0)
MONO ABS: 1 10*3/uL (ref 0.1–1.0)
Monocytes Relative: 11.7 % (ref 3.0–12.0)
NEUTROS PCT: 67 % (ref 43.0–77.0)
Neutro Abs: 6 10*3/uL (ref 1.4–7.7)
PLATELETS: 162 10*3/uL (ref 150.0–400.0)
RBC: 4.21 Mil/uL — AB (ref 4.22–5.81)
RDW: 16.1 % — AB (ref 11.5–15.5)
WBC: 8.9 10*3/uL (ref 4.0–10.5)

## 2018-06-30 LAB — LIPID PANEL
Cholesterol: 120 mg/dL (ref 0–200)
HDL: 35.4 mg/dL — ABNORMAL LOW (ref >39.00)
LDL CALC: 60 mg/dL (ref 0–99)
NONHDL: 84.53
Total CHOL/HDL Ratio: 3
Triglycerides: 123 mg/dL (ref 0.0–149.0)
VLDL: 24.6 mg/dL (ref 0.0–40.0)

## 2018-06-30 LAB — MICROALBUMIN / CREATININE URINE RATIO
CREATININE, U: 156 mg/dL
MICROALB/CREAT RATIO: 4.5 mg/g (ref 0.0–30.0)
Microalb, Ur: 7 mg/dL — ABNORMAL HIGH (ref 0.0–1.9)

## 2018-06-30 LAB — COMPREHENSIVE METABOLIC PANEL
ALT: 13 U/L (ref 0–53)
AST: 11 U/L (ref 0–37)
Albumin: 4.3 g/dL (ref 3.5–5.2)
Alkaline Phosphatase: 123 U/L — ABNORMAL HIGH (ref 39–117)
BUN: 25 mg/dL — AB (ref 6–23)
CO2: 27 mEq/L (ref 19–32)
Calcium: 9.8 mg/dL (ref 8.4–10.5)
Chloride: 103 mEq/L (ref 96–112)
Creatinine, Ser: 1.26 mg/dL (ref 0.40–1.50)
GFR: 58.1 mL/min — ABNORMAL LOW (ref >60.00)
Glucose, Bld: 105 mg/dL — ABNORMAL HIGH (ref 70–99)
POTASSIUM: 4 meq/L (ref 3.5–5.1)
SODIUM: 137 meq/L (ref 135–145)
Total Bilirubin: 0.6 mg/dL (ref 0.2–1.2)
Total Protein: 7.6 g/dL (ref 6.0–8.3)

## 2018-06-30 LAB — HEMOGLOBIN A1C: Hgb A1c MFr Bld: 6.5 % (ref 4.6–6.5)

## 2018-06-30 LAB — TSH: TSH: 1.67 u[IU]/mL (ref 0.35–4.50)

## 2018-06-30 MED ORDER — DOXYCYCLINE HYCLATE 100 MG PO TABS: 100.0000 mg | ORAL_TABLET | Freq: Two times a day (BID) | ORAL | 0 refills | Status: DC

## 2018-06-30 MED ORDER — ALBUTEROL SULFATE HFA 108 (90 BASE) MCG/ACT IN AERS: 2.0000 | INHALATION_SPRAY | Freq: Four times a day (QID) | RESPIRATORY_TRACT | 2 refills | Status: DC | PRN

## 2018-06-30 MED ORDER — PREDNISONE 20 MG PO TABS: ORAL_TABLET | ORAL | 0 refills | Status: DC

## 2018-06-30 NOTE — Assessment & Plan Note (Signed)
Patient was very upset today due to perception that he was coming in weekly and never got to see me. I reviewed this misperception with patient - he was last in office 2 months ago for hospital f/u, and he has seen me 6/7 visits this past year.  Some concern of worsening memory trouble. Will further assess at physical next week.

## 2018-06-30 NOTE — Progress Notes (Signed)
Nursing note: Patient was in office today for AWV. Patient stated he was not feeling well. Patient reports headache, cough and chills that began approx. 1 week ago. Patient states cough is productive. Sputum has been white in color. Patient has diminished breath sounds, expiratory wheezing on right side, and pursed lip breathing. Respirations are 25. PCP is aware. Same day acute visit scheduled.   CPE labs were obtained. Patient will provide urine specimen today if able.

## 2018-06-30 NOTE — Assessment & Plan Note (Signed)
Continued smoker. Don't anticipate improvement with ongoing tobacco use.

## 2018-06-30 NOTE — Patient Instructions (Addendum)
I think you have COPD flare - treat with steroid and antibiotic course sent to pharmacy. May use albuterol inhaler as needed for shortness of breath or cough.  Xray today.  Keep physical appt next week.

## 2018-06-30 NOTE — Progress Notes (Signed)
BP 138/70 (BP Location: Left Arm, Patient Position: Sitting, Cuff Size: Normal)   Pulse 90   Temp 98.3 F (36.8 C) (Oral)   Ht 5' 10.5" (1.791 m) Comment: shoes  Wt 162 lb 12 oz (73.8 kg)   SpO2 96%   BMI 23.02 kg/m    CC: HA, cough, wheezing Subjective:    Patient ID: Troy [name], male    DOB: [birth year], 84 y.o.   MRN: [id number]  HPI: Troy [name] is a 84 y.o. male presenting on 06/30/2018 for Headache (Has had sxs for at least 1 mo.); Chills; Cough; and Wheezing   Known COPD in continued smoker.  Was coming in today for wellness visit with my wellness nurse. Upset because he thought he was coming in to see me as he was sick. Worked in acutely same day due to this. States he is also upset because he hasn't seen me in last 5 visits to clinic. Reviewing chart, I have seen him 6 times this past year, has only seen 1 different provider besides myself (12/2017). We reviewed this.   Endorses several weeks of frontal headaches as well as cough and wheezing and dyspnea. Increased sputum production. Unsure if increased dyspnea. + chills.   No fevers, ST, ear or tooth pain.  No sick contacts at home.  Continued smoker 1ppd.   Relevant past medical, surgical, family and social history reviewed and updated as indicated. Interim medical history since our last visit reviewed. Allergies and medications reviewed and updated. Outpatient Medications Prior to Visit  Medication Sig Dispense Refill  . aspirin 81 MG tablet Take 81 mg by mouth daily.     . fluticasone (FLONASE) 50 MCG/ACT nasal spray Place 2 sprays into both nostrils daily. 16 g 1  . isosorbide mononitrate (IMDUR) 30 MG 24 hr tablet Take 0.5 tablets (15 mg total) by mouth daily. 45 tablet 4  . magnesium hydroxide (MILK OF MAGNESIA) 400 MG/5ML suspension Take 15 mLs by mouth daily as needed for moderate constipation. 360 mL 0  . metFORMIN (GLUCOPHAGE) 500 MG tablet TAKE 1 TABLET BY MOUTH TWO  TIMES DAILY WITH MEALS 180  tablet 4  . metoprolol tartrate (LOPRESSOR) 25 MG tablet TAKE ONE-HALF TABLET BY  MOUTH TWO TIMES DAILY 90 tablet 0  . nystatin (MYCOSTATIN/NYSTOP) powder Apply topically 3 (three) times daily. 45 g 0  . omeprazole (PRILOSEC) 40 MG capsule Take 1 capsule (40 mg total) by mouth daily. 90 capsule 0  . simvastatin (ZOCOR) 40 MG tablet Take 1 tablet (40 mg total) by mouth at bedtime. 90 tablet 4   No facility-administered medications prior to visit.      Per HPI unless specifically indicated in ROS section below Review of Systems     Objective:    BP 138/70 (BP Location: Left Arm, Patient Position: Sitting, Cuff Size: Normal)   Pulse 90   Temp 98.3 F (36.8 C) (Oral)   Ht 5' 10.5" (1.791 m) Comment: shoes  Wt 162 lb 12 oz (73.8 kg)   SpO2 96%   BMI 23.02 kg/m   Wt Readings from Last 3 Encounters:  06/30/18 162 lb 12 oz (73.8 kg)  05/05/18 161 lb 4 oz (73.1 kg)  04/26/18 140 lb (63.5 kg)    Physical Exam  Constitutional: He appears well-developed and well-nourished. No distress.  HENT:  Head: Normocephalic and atraumatic.  Right Ear: Hearing, tympanic membrane, external ear and ear canal normal.  Left Ear: Hearing, tympanic membrane, external ear and ear  canal normal.  Nose: Nose normal. No mucosal edema or rhinorrhea. Right sinus exhibits no maxillary sinus tenderness and no frontal sinus tenderness. Left sinus exhibits no maxillary sinus tenderness and no frontal sinus tenderness.  Mouth/Throat: Uvula is midline, oropharynx is clear and moist and mucous membranes are normal. No oropharyngeal exudate, posterior oropharyngeal edema, posterior oropharyngeal erythema or tonsillar abscesses.  Eyes: Pupils are equal, round, and reactive to light. Conjunctivae and EOM are normal. No scleral icterus.  Neck: Normal range of motion. Neck supple.  Cardiovascular: Normal rate, regular rhythm, normal heart sounds and intact distal pulses.  No murmur heard. Pulmonary/Chest: Effort normal. No  respiratory distress. He has decreased breath sounds. He has no wheezes. He has rhonchi (faint LLL). He has no rales.  Lymphadenopathy:    He has no cervical adenopathy.  Skin: Skin is warm and dry. No rash noted.  Nursing note and vitals reviewed.  Lab Results  Component Value Date   HGBA1C 6.9 (H) 10/15/2017       Assessment & Plan:   Problem List Items Addressed This Visit    Tobacco use   Memory deficit    Patient was very upset today due to perception that he was coming in weekly and never got to see me. I reviewed this misperception with patient - he was last in office 2 months ago for hospital f/u, and he has seen me 6/7 visits this past year.  Some concern of worsening memory trouble. Will further assess at physical next week.       COPD exacerbation ([place]) - Primary    Anticipate COPD exacerbation given increased cough and increased sputum production. Some increased work of breathing noted but pt stable for outpatient management.  Rx prednisone taper, doxy course x1 wk, and albuterol inhaler sent to pharmacy. Update if not improving with treatment. Reassess at CPE next week. Pt agrees with plan.  I don't see obvious pneumonia on cxr today.       Relevant Medications   predniSONE (DELTASONE) 20 MG tablet   albuterol (PROVENTIL HFA;VENTOLIN HFA) 108 (90 Base) MCG/ACT inhaler   Other Relevant Orders   DG Chest 2 View   COPD (chronic obstructive pulmonary disease) ([place])    Continued smoker. Don't anticipate improvement with ongoing tobacco use.       Relevant Medications   predniSONE (DELTASONE) 20 MG tablet   albuterol (PROVENTIL HFA;VENTOLIN HFA) 108 (90 Base) MCG/ACT inhaler       Meds ordered this encounter  Medications  . predniSONE (DELTASONE) 20 MG tablet    Sig: Take two tablets daily for 3 days followed by one tablet daily for 4 days    Dispense:  10 tablet    Refill:  0  . doxycycline (VIBRA-TABS) 100 MG tablet    Sig: Take 1 tablet (100 mg total) by  mouth 2 (two) times daily.    Dispense:  14 tablet    Refill:  0  . albuterol (PROVENTIL HFA;VENTOLIN HFA) 108 (90 Base) MCG/ACT inhaler    Sig: Inhale 2 puffs into the lungs every 6 (six) hours as needed for wheezing or shortness of breath.    Dispense:  1 Inhaler    Refill:  2   Orders Placed This Encounter  Procedures  . DG Chest 2 View    Standing Status:   Future    Number of Occurrences:   1    Standing Expiration Date:   08/31/2019    Order Specific Question:   Reason  for Exam (SYMPTOM  OR DIAGNOSIS REQUIRED)    Answer:   COPD exac    Order Specific Question:   Preferred imaging location?    Answer:   [facility]    Order Specific Question:   Radiology Contrast Protocol - do NOT remove file path    Answer:   [email or web address]    Follow up plan: Return if symptoms worsen or fail to improve.  [name], MD

## 2018-06-30 NOTE — Assessment & Plan Note (Addendum)
Anticipate COPD exacerbation given increased cough and increased sputum production. Some increased work of breathing noted but pt stable for outpatient management.  Rx prednisone taper, doxy course x1 wk, and albuterol inhaler sent to pharmacy. Update if not improving with treatment. Reassess at CPE next week. Pt agrees with plan.  I don't see obvious pneumonia on cxr today.

## 2018-06-30 NOTE — Progress Notes (Signed)
No charge. AWV cancelled. Labs only. Patient does not feel well.

## 2018-07-07 ENCOUNTER — Encounter: Payer: Self-pay | Admitting: Family Medicine

## 2018-07-07 ENCOUNTER — Ambulatory Visit (INDEPENDENT_AMBULATORY_CARE_PROVIDER_SITE_OTHER): Payer: Medicare Other | Admitting: Family Medicine

## 2018-07-07 VITALS — BP 138/70 | HR 65 | Temp 98.0°F | Ht 69.0 in | Wt 160.5 lb

## 2018-07-07 DIAGNOSIS — Z720 Tobacco use: Secondary | ICD-10-CM | POA: Diagnosis not present

## 2018-07-07 DIAGNOSIS — I10 Essential (primary) hypertension: Secondary | ICD-10-CM

## 2018-07-07 DIAGNOSIS — R413 Other amnesia: Secondary | ICD-10-CM

## 2018-07-07 DIAGNOSIS — J449 Chronic obstructive pulmonary disease, unspecified: Secondary | ICD-10-CM

## 2018-07-07 DIAGNOSIS — I471 Supraventricular tachycardia: Secondary | ICD-10-CM | POA: Diagnosis not present

## 2018-07-07 DIAGNOSIS — H903 Sensorineural hearing loss, bilateral: Secondary | ICD-10-CM | POA: Diagnosis not present

## 2018-07-07 DIAGNOSIS — Z7189 Other specified counseling: Secondary | ICD-10-CM | POA: Diagnosis not present

## 2018-07-07 DIAGNOSIS — D649 Anemia, unspecified: Secondary | ICD-10-CM

## 2018-07-07 DIAGNOSIS — I700 Atherosclerosis of aorta: Secondary | ICD-10-CM

## 2018-07-07 DIAGNOSIS — D696 Thrombocytopenia, unspecified: Secondary | ICD-10-CM | POA: Diagnosis not present

## 2018-07-07 DIAGNOSIS — I25119 Atherosclerotic heart disease of native coronary artery with unspecified angina pectoris: Secondary | ICD-10-CM

## 2018-07-07 DIAGNOSIS — I708 Atherosclerosis of other arteries: Secondary | ICD-10-CM

## 2018-07-07 DIAGNOSIS — E7800 Pure hypercholesterolemia, unspecified: Secondary | ICD-10-CM

## 2018-07-07 DIAGNOSIS — K279 Peptic ulcer, site unspecified, unspecified as acute or chronic, without hemorrhage or perforation: Secondary | ICD-10-CM

## 2018-07-07 DIAGNOSIS — K5904 Chronic idiopathic constipation: Secondary | ICD-10-CM

## 2018-07-07 DIAGNOSIS — Z0000 Encounter for general adult medical examination without abnormal findings: Secondary | ICD-10-CM | POA: Diagnosis not present

## 2018-07-07 DIAGNOSIS — K2270 Barrett's esophagus without dysplasia: Secondary | ICD-10-CM

## 2018-07-07 DIAGNOSIS — E118 Type 2 diabetes mellitus with unspecified complications: Secondary | ICD-10-CM

## 2018-07-07 NOTE — Patient Instructions (Addendum)
We will need one more pneumonia shot (prevnar). Let us know when you'd like to get this.  Schedule eye appointment if not recently seen - to monitor cataracts.  Memory test today did raise possible concern about memory. Work on physical activity, mental activity (reading books, word puzzles) and staying socially engaged. Consider memory medicine to help.  I'd like you to return in 3 months with your son for follow up visit.  I will touch base with your son about coming in at next visit.

## 2018-07-07 NOTE — Progress Notes (Signed)
BP 138/70 (BP Location: Right Arm, Patient Position: Sitting, Cuff Size: Normal)   Pulse 65   Temp 98 F (36.7 C) (Oral)   Ht 5\' 9"  (1.753 m)   Wt 160 lb 8 oz (72.8 kg)   SpO2 96%   BMI 23.70 kg/m    CC: CPE Subjective:    Patient ID: Troy [name], male    DOB: 17-Apr-1933, 84 y.o.   MRN: [id number]  HPI: Troy [name] is a 84 y.o. male presenting on 07/07/2018 for Medicare Wellness (Pt refuses to fill out paperwork. States he had eyes/hears checked with Katha Cabal. )   See prior note for details. Did not see Lesia last week. Wife passed away early 12/29/16 from lung cancer  Seen last week for COPD exacerbation treated with doxycycline and prednisone course. Fully better from this.   No exam data present  Passes fall and depression screens. Declined hearing and vision screens. He declined filling out medicare questionairre today. Reviewing chart, he did receive CPE 10/15/2017.   Did not remember back surgery 12-30-11.   MMSE = 24/30, missed 3 recall, 2 orientation and 1 language (06/2018)  Preventative: COLONOSCOPY 10/2014; 2 hyperplastic polyp, hemmorhoids ([place]) Prostate cancer screening - aged out Lung cancer screening - not eligible due to age Flu shot - yearly Td 2009/12/29 Pneumovax 30-Dec-2011, prevnar declines today Shingles shot - declines Advanced directive discussion - I asked son to bring me copy of advanced directive - they have completed this at home and son [name] is HCPOA.  Seat belt use discussed Sunscreen use discussed. No changing moles on skin.  Smoking 1/2 ppd +. Minimal dipping.  Alcohol - none Dentist yearly Eye exam yearly  Lives alone, widower (wife passed away December 29, 2016); No pets He is very active He was in the [employer] for 4 years. He gets meds from the [place] Activity: walking daily since back surgery  Diet: good water, not much fruits/vegetables   Relevant past medical, surgical, family and social history reviewed and updated as indicated. Interim medical history  since our last visit reviewed. Allergies and medications reviewed and updated. Outpatient Medications Prior to Visit  Medication Sig Dispense Refill  . albuterol (PROVENTIL HFA;VENTOLIN HFA) 108 (90 Base) MCG/ACT inhaler Inhale 2 puffs into the lungs every 6 (six) hours as needed for wheezing or shortness of breath. 1 Inhaler 2  . aspirin 81 MG tablet Take 81 mg by mouth daily.     . fluticasone (FLONASE) 50 MCG/ACT nasal spray Place 2 sprays into both nostrils daily. 16 g 1  . isosorbide mononitrate (IMDUR) 30 MG 24 hr tablet Take 0.5 tablets (15 mg total) by mouth daily. 45 tablet 4  . magnesium hydroxide (MILK OF MAGNESIA) 400 MG/5ML suspension Take 15 mLs by mouth daily as needed for moderate constipation. 360 mL 0  . metFORMIN (GLUCOPHAGE) 500 MG tablet TAKE 1 TABLET BY MOUTH TWO  TIMES DAILY WITH MEALS 180 tablet 4  . nystatin (MYCOSTATIN/NYSTOP) powder Apply topically 3 (three) times daily. 45 g 0  . simvastatin (ZOCOR) 40 MG tablet Take 1 tablet (40 mg total) by mouth at bedtime. 90 tablet 4  . doxycycline (VIBRA-TABS) 100 MG tablet Take 1 tablet (100 mg total) by mouth 2 (two) times daily. 14 tablet 0  . metoprolol tartrate (LOPRESSOR) 25 MG tablet TAKE ONE-HALF TABLET BY  MOUTH TWO TIMES DAILY 90 tablet 0  . omeprazole (PRILOSEC) 40 MG capsule Take 1 capsule (40 mg total) by mouth daily. 90 capsule 0  .  predniSONE (DELTASONE) 20 MG tablet Take two tablets daily for 3 days followed by one tablet daily for 4 days 10 tablet 0   No facility-administered medications prior to visit.      Per HPI unless specifically indicated in ROS section below Review of Systems  Constitutional: Negative for activity change, appetite change, chills, fatigue, fever and unexpected weight change.  HENT: Negative for hearing loss.   Eyes: Negative for visual disturbance.  Respiratory: Negative for cough, chest tightness, shortness of breath and wheezing.   Cardiovascular: Negative for chest pain,  palpitations and leg swelling.  Gastrointestinal: Negative for abdominal distention, abdominal pain, blood in stool, constipation, diarrhea, nausea and vomiting.  Genitourinary: Negative for difficulty urinating and hematuria.  Musculoskeletal: Negative for arthralgias, myalgias and neck pain.  Skin: Negative for rash.  Neurological: Negative for dizziness, seizures, syncope and headaches.  Hematological: Negative for adenopathy. Does not bruise/bleed easily.  Psychiatric/Behavioral: Negative for dysphoric mood. The patient is not nervous/anxious.        Objective:    BP 138/70 (BP Location: Right Arm, Patient Position: Sitting, Cuff Size: Normal)   Pulse 65   Temp 98 F (36.7 C) (Oral)   Ht 5\' 9"  (1.753 m)   Wt 160 lb 8 oz (72.8 kg)   SpO2 96%   BMI 23.70 kg/m   Wt Readings from Last 3 Encounters:  07/07/18 160 lb 8 oz (72.8 kg)  06/30/18 162 lb 12 oz (73.8 kg)  05/05/18 161 lb 4 oz (73.1 kg)    Physical Exam  Constitutional: He is oriented to person, place, and time. He appears well-developed and well-nourished. No distress.  HENT:  Head: Normocephalic and atraumatic.  Right Ear: Hearing, tympanic membrane, external ear and ear canal normal.  Left Ear: Hearing, tympanic membrane, external ear and ear canal normal.  Nose: Nose normal.  Mouth/Throat: Uvula is midline, oropharynx is clear and moist and mucous membranes are normal. No oropharyngeal exudate, posterior oropharyngeal edema or posterior oropharyngeal erythema.  Eyes: Pupils are equal, round, and reactive to light. Conjunctivae and EOM are normal. No scleral icterus.  Bilateral cataracts present  Neck: Normal range of motion. Neck supple. Carotid bruit is not present.  Cardiovascular: Normal rate, regular rhythm, normal heart sounds and intact distal pulses.  No murmur heard. Pulses:      Radial pulses are 2+ on the right side, and 2+ on the left side.  Pulmonary/Chest: Effort normal and breath sounds normal. No  respiratory distress. He has no wheezes. He has no rales.  Coarse breath sounds  Abdominal: Soft. Bowel sounds are normal. He exhibits no distension and no mass. There is no tenderness. There is no rebound and no guarding.  Musculoskeletal: Normal range of motion. He exhibits no edema.  Lymphadenopathy:    He has no cervical adenopathy.  Neurological: He is alert and oriented to person, place, and time.  CN grossly intact, station and gait intact  Skin: Skin is warm and dry. No rash noted.  Psychiatric: He has a normal mood and affect. His behavior is normal. Judgment and thought content normal.  Nursing note and vitals reviewed.  Results for orders placed or performed in visit on 06/30/18  Microalbumin / creatinine urine ratio  Result Value Ref Range   Microalb, Ur 7.0 (H) 0.0 - 1.9 mg/dL   Creatinine,U 156.0 mg/dL   Microalb Creat Ratio 4.5 0.0 - 30.0 mg/g  CBC with Differential/Platelet  Result Value Ref Range   WBC 8.9 4.0 - 10.5 K/uL  RBC 4.21 (L) 4.22 - 5.81 Mil/uL   Hemoglobin 12.2 (L) 13.0 - 17.0 g/dL   HCT 37.1 (L) 39.0 - 52.0 %   MCV 88.3 78.0 - 100.0 fl   MCHC 32.8 30.0 - 36.0 g/dL   RDW 16.1 (H) 11.5 - 15.5 %   Platelets 162.0 150.0 - 400.0 K/uL   Neutrophils Relative % 67.0 43.0 - 77.0 %   Lymphocytes Relative 20.4 12.0 - 46.0 %   Monocytes Relative 11.7 3.0 - 12.0 %   Eosinophils Relative 0.6 0.0 - 5.0 %   Basophils Relative 0.3 0.0 - 3.0 %   Neutro Abs 6.0 1.4 - 7.7 K/uL   Lymphs Abs 1.8 0.7 - 4.0 K/uL   Monocytes Absolute 1.0 0.1 - 1.0 K/uL   Eosinophils Absolute 0.1 0.0 - 0.7 K/uL   Basophils Absolute 0.0 0.0 - 0.1 K/uL  Hemoglobin A1c  Result Value Ref Range   Hgb A1c MFr Bld 6.5 4.6 - 6.5 %  TSH  Result Value Ref Range   TSH 1.67 0.35 - 4.50 uIU/mL  Lipid panel  Result Value Ref Range   Cholesterol 120 0 - 200 mg/dL   Triglycerides 123.0 0.0 - 149.0 mg/dL   HDL 35.40 (L) >39.00 mg/dL   VLDL 24.6 0.0 - 40.0 mg/dL   LDL Cholesterol 60 0 - 99 mg/dL     Total CHOL/HDL Ratio 3    NonHDL 84.53   Comprehensive metabolic panel  Result Value Ref Range   Sodium 137 135 - 145 mEq/L   Potassium 4.0 3.5 - 5.1 mEq/L   Chloride 103 96 - 112 mEq/L   CO2 27 19 - 32 mEq/L   Glucose, Bld 105 (H) 70 - 99 mg/dL   BUN 25 (H) 6 - 23 mg/dL   Creatinine, Ser 1.26 0.40 - 1.50 mg/dL   Total Bilirubin 0.6 0.2 - 1.2 mg/dL   Alkaline Phosphatase 123 (H) 39 - 117 U/L   AST 11 0 - 37 U/L   ALT 13 0 - 53 U/L   Total Protein 7.6 6.0 - 8.3 g/dL   Albumin 4.3 3.5 - 5.2 g/dL   Calcium 9.8 8.4 - 10.5 mg/dL   GFR 58.10 (L) >60.00 mL/min   Lab Results  Component Value Date   VITAMINB12 445 02/14/2015       Assessment & Plan:  Son Don's home # [phone] Son's work # [phone] Son's cell # [phone] Problem List Items Addressed This Visit    Tobacco use    Continues smoking, not likely to quit.      Thrombocytopenia ([place])    This is improved today.       Supraventricular tachycardia (HCC)    Controlled on low dose metoprolol.       Relevant Medications   metoprolol tartrate (LOPRESSOR) 25 MG tablet   Sensorineural hearing loss (SNHL) of both ears    Patient declined hearing scree today.       PUD (peptic ulcer disease)    H/o this with hematemesis 04/2011 - continue daily PPI      Memory deficit    Patient was somewhat agitated about having to complete MMSE but did complete this. MMSE 24/30. I discussed with patient recommendations to increase mental and physical activity as well as social engagement. After obtaining permission from [place], I spoke with son [name] about recent MMSE concerning for memory trouble. [name] worried dad is depressed after wife's passing early last year. Possible pseudodementia. I asked them  to return in 2-3 months for f/u memory.       Medicare annual wellness visit, subsequent - Primary    Patient refused to fill out wellness questionairre. I have personally noted 1. The patient's medical and social  history 2. Their use of alcohol, tobacco or illicit drugs 3. Their current medications and supplements 4. The patient's functional ability including ADL's, fall risks, home safety risks and hearing or visual impairment. Cognitive function has been assessed and addressed as indicated.  5. Diet and physical activity 6. Evidence for depression or mood disorders The patients weight, height, BMI have been recorded in the chart. I have made referrals, counseling and provided education to the patient based on review of the above and I have provided the pt with a written personalized care plan for preventive services. Provider list updated.. Reviewed preventative protocols and updated unless pt declined.       Hypertension    Chronic, stable. Continue current regimen.       Relevant Medications   metoprolol tartrate (LOPRESSOR) 25 MG tablet   HYPERCHOLESTEROLEMIA    Chronic, stable - continue simvastatin. The ASCVD Risk score Mikey Bussing DC Jr., et al., 2013) failed to calculate for the following reasons:   The 2013 ASCVD risk score is only valid for ages 7 to 72       Relevant Medications   metoprolol tartrate (LOPRESSOR) 25 MG tablet   Diabetes mellitus type 2 with complications (HCC)    Chronic, stable on low dose metformin.       COPD (chronic obstructive pulmonary disease) ([place])    Continued smoker. Does not use regular inhaler, but has albuterol rescue inhaler at home.       Chronic idiopathic constipation    Continue MOM PRN. He feels this is controlled.      CAD (coronary artery disease)    Continue aspirin, statin, beta blocker and imdur.       Relevant Medications   metoprolol tartrate (LOPRESSOR) 25 MG tablet   Barrett's esophagus    Continue PPI indefinitely.      Aorto-iliac atherosclerosis (HCC)    Continue aspirin, statin.       Relevant Medications   metoprolol tartrate (LOPRESSOR) 25 MG tablet   Anemia    Mild anemia - will further evaluate next visit with  anemia panel      Advanced care planning/counseling discussion    I asked son to bring me copy of advanced directive - they have completed this at home and son [name] is HCPOA.           Meds ordered this encounter  Medications  . metoprolol tartrate (LOPRESSOR) 25 MG tablet    Sig: TAKE ONE-HALF TABLET BY  MOUTH TWO TIMES DAILY    Dispense:  90 tablet    Refill:  3  . omeprazole (PRILOSEC) 40 MG capsule    Sig: Take 1 capsule (40 mg total) by mouth daily.    Dispense:  90 capsule    Refill:  3   No orders of the defined types were placed in this encounter.   Follow up plan: Return in about 3 months (around 10/07/2018) for follow up visit.  [name], MD

## 2018-07-07 NOTE — Assessment & Plan Note (Deleted)
Preventative protocols reviewed and updated unless pt declined. Discussed healthy diet and lifestyle.  

## 2018-07-08 ENCOUNTER — Encounter: Payer: Self-pay | Admitting: Family Medicine

## 2018-07-08 MED ORDER — METOPROLOL TARTRATE 25 MG PO TABS: ORAL_TABLET | ORAL | 3 refills | Status: DC

## 2018-07-08 MED ORDER — OMEPRAZOLE 40 MG PO CPDR: 40.0000 mg | DELAYED_RELEASE_CAPSULE | Freq: Every day | ORAL | 3 refills | Status: DC

## 2018-07-08 NOTE — Assessment & Plan Note (Signed)
Controlled on low dose metoprolol 

## 2018-07-08 NOTE — Assessment & Plan Note (Signed)
Continue aspirin, statin, beta blocker and imdur.

## 2018-07-08 NOTE — Assessment & Plan Note (Signed)
Continued smoker. Does not use regular inhaler, but has albuterol rescue inhaler at home.

## 2018-07-08 NOTE — Assessment & Plan Note (Signed)
H/o this with hematemesis 04/2011 - continue daily PPI

## 2018-07-08 NOTE — Assessment & Plan Note (Signed)
Chronic, stable - continue simvastatin. The ASCVD Risk score Mikey Bussing DC Jr., et al., 2013) failed to calculate for the following reasons:   The 2013 ASCVD risk score is only valid for ages 60 to 7

## 2018-07-08 NOTE — Assessment & Plan Note (Addendum)
Patient declined hearing scree today.

## 2018-07-08 NOTE — Assessment & Plan Note (Signed)
Patient was somewhat agitated about having to complete MMSE but did complete this. MMSE 24/30. I discussed with patient recommendations to increase mental and physical activity as well as social engagement. After obtaining permission from [place], I spoke with son Troy [name] about recent MMSE concerning for memory trouble. Troy [name] worried dad is depressed after wife's passing early last year. Possible pseudodementia. I asked them to return in 2-3 months for f/u memory.

## 2018-07-08 NOTE — Assessment & Plan Note (Signed)
Chronic, stable. Continue current regimen. 

## 2018-07-08 NOTE — Assessment & Plan Note (Signed)
Continue PPI indefinitely. 

## 2018-07-08 NOTE — Assessment & Plan Note (Signed)
Continue aspirin, statin.  

## 2018-07-08 NOTE — Assessment & Plan Note (Signed)
I asked son to bring me copy of advanced directive - they have completed this at home and son [name] is [place].

## 2018-07-08 NOTE — Assessment & Plan Note (Addendum)
Patient refused to fill out wellness questionairre. I have personally noted 1. The patient's medical and social history 2. Their use of alcohol, tobacco or illicit drugs 3. Their current medications and supplements 4. The patient's functional ability including ADL's, fall risks, home safety risks and hearing or visual impairment. Cognitive function has been assessed and addressed as indicated.  5. Diet and physical activity 6. Evidence for depression or mood disorders The patients weight, height, BMI have been recorded in the chart. I have made referrals, counseling and provided education to the patient based on review of the above and I have provided the pt with a written personalized care plan for preventive services. Provider list updated.. Reviewed preventative protocols and updated unless pt declined.

## 2018-07-08 NOTE — Assessment & Plan Note (Signed)
Mild anemia - will further evaluate next visit with anemia panel

## 2018-07-08 NOTE — Assessment & Plan Note (Signed)
This is improved today.

## 2018-07-08 NOTE — Assessment & Plan Note (Signed)
Chronic, stable on low dose metformin.  

## 2018-07-08 NOTE — Assessment & Plan Note (Signed)
Continue MOM PRN. He feels this is controlled.

## 2018-07-08 NOTE — Assessment & Plan Note (Signed)
Continues smoking, not likely to quit.

## 2018-08-11 ENCOUNTER — Ambulatory Visit (INDEPENDENT_AMBULATORY_CARE_PROVIDER_SITE_OTHER): Payer: Medicare Other | Admitting: Family Medicine

## 2018-08-11 ENCOUNTER — Telehealth: Payer: Self-pay | Admitting: Family Medicine

## 2018-08-11 VITALS — BP 140/80 | HR 102 | Temp 98.5°F | Ht 69.0 in | Wt 163.0 lb

## 2018-08-11 DIAGNOSIS — J449 Chronic obstructive pulmonary disease, unspecified: Secondary | ICD-10-CM | POA: Diagnosis not present

## 2018-08-11 DIAGNOSIS — Z720 Tobacco use: Secondary | ICD-10-CM | POA: Diagnosis not present

## 2018-08-11 DIAGNOSIS — J441 Chronic obstructive pulmonary disease with (acute) exacerbation: Secondary | ICD-10-CM

## 2018-08-11 MED ORDER — DOXYCYCLINE HYCLATE 100 MG PO TABS: 100.0000 mg | ORAL_TABLET | Freq: Two times a day (BID) | ORAL | 0 refills | Status: DC

## 2018-08-11 MED ORDER — FLUTICASONE FUROATE-VILANTEROL 100-25 MCG/INH IN AEPB: 1.0000 | INHALATION_SPRAY | Freq: Every day | RESPIRATORY_TRACT | 6 refills | Status: DC

## 2018-08-11 MED ORDER — PREDNISONE 20 MG PO TABS: ORAL_TABLET | ORAL | 0 refills | Status: DC

## 2018-08-11 MED ORDER — IPRATROPIUM-ALBUTEROL 0.5-2.5 (3) MG/3ML IN SOLN
3.0000 mL | Freq: Once | RESPIRATORY_TRACT | Status: AC
  Administered 2018-08-11: 3 mL via RESPIRATORY_TRACT

## 2018-08-11 MED ORDER — ALBUTEROL SULFATE HFA 108 (90 BASE) MCG/ACT IN AERS: 2.0000 | INHALATION_SPRAY | Freq: Four times a day (QID) | RESPIRATORY_TRACT | 6 refills | Status: DC | PRN

## 2018-08-11 NOTE — Telephone Encounter (Signed)
Pt called to the office wanting an appt with Dr Darnell Level because he stated he had a fever and body aches but they had subsided. Pt want an appt just to make sure everything was alright because he was going out of town and didn't know when he was coming back. Pt was very rude and stated is that all I do is answer the phone, and I hope they pay you well. Pt stated this was ridiculous every time he call the office he can never get an appt with his doctor. I advise pt to try and call in advance for an appt and I offered him an appt with Dr Glori Bickers and her didn't want it. I asked pt if  He wanted to talk to my manager and he said yes and when she got on the phone he wouldn't say anything and hung up. Junie Panning advised me to send this message to you to make you aware of how the pt reacted.

## 2018-08-11 NOTE — Telephone Encounter (Signed)
Seen today. 

## 2018-08-11 NOTE — Patient Instructions (Addendum)
I think your COPD is currently flared  Albuterol/atrovent nebulization given in office. Start prednisone taper over the next week as well as antibiotic sent to pharmacy.  After you finish prednisone medicine, start breo inhaler sent to pharmacy.  Use albuterol rescue inhaler when you feel short winded or wheezy or increased cough. This helps open up the lungs.  Let us know if fever >101, worsening productive cough, or not improving with above treatment.  Schedule follow up visit in 1 month - come with your son.    Chronic Obstructive Pulmonary Disease Chronic obstructive pulmonary disease (COPD) is a long-term (chronic) lung problem. When you have COPD, it is hard for air to get in and out of your lungs. The way your lungs work will never return to normal. Usually the condition gets worse over time. There are things you can do to keep yourself as healthy as possible. Your doctor may treat your condition with:  Medicines.  Quitting smoking, if you smoke.  Rehabilitation. This may involve a team of specialists.  Oxygen.  Exercise and changes to your diet.  Lung surgery.  Comfort measures (palliative care).  Follow these instructions at home: Medicines  Take over-the-counter and prescription medicines only as told by your doctor.  Talk to your doctor before taking any cough or allergy medicines. You may need to avoid medicines that cause your lungs to be dry. Lifestyle  If you smoke, stop. Smoking makes the problem worse. If you need help quitting, ask your doctor.  Avoid being around things that make your breathing worse. This may include smoke, chemicals, and fumes.  Stay active, but remember to also rest.  Learn and use tips on how to relax.  Make sure you get enough sleep. Most adults need at least 7 hours a night.  Eat healthy foods. Eat smaller meals more often. Rest before meals. Controlled breathing  Learn and use tips on how to control your breathing as told by  your doctor. Try: ? Breathing in (inhaling) through your nose for 1 second. Then, pucker your lips and breath out (exhale) through your lips for 2 seconds. ? Putting one hand on your belly (abdomen). Breathe in slowly through your nose for 1 second. Your hand on your belly should move out. Pucker your lips and breathe out slowly through your lips. Your hand on your belly should move in as you breathe out. Controlled coughing  Learn and use controlled coughing to clear mucus from your lungs. The steps are: 1. Lean your head a little forward. 2. Breathe in deeply. 3. Try to hold your breath for 3 seconds. 4. Keep your mouth slightly open while coughing 2 times. 5. Spit any mucus out into a tissue. 6. Rest and do the steps again 1 or 2 times as needed. General instructions  Make sure you get all the shots (vaccines) that your doctor recommends. Ask your doctor about a flu shot and a pneumonia shot.  Use oxygen therapy and therapy to help improve your lungs (pulmonary rehabilitation) if told by your doctor. If you need home oxygen therapy, ask your doctor if you should buy a tool to measure your oxygen level (oximeter).  Make a COPD action plan with your doctor. This helps you know what to do if you feel worse than usual.  Manage any other conditions you have as told by your doctor.  Avoid going outside when it is very hot, cold, or humid.  Avoid people who have a sickness you can catch (  contagious).  Keep all follow-up visits as told by your doctor. This is important. Contact a doctor if:  You cough up more mucus than usual.  There is a change in the color or thickness of the mucus.  It is harder to breathe than usual.  Your breathing is faster than usual.  You have trouble sleeping.  You need to use your medicines more often than usual.  You have trouble doing your normal activities such as getting dressed or walking around the house. Get help right away if:  You have  shortness of breath while resting.  You have shortness of breath that stops you from: ? Being able to talk. ? Doing normal activities.  Your chest hurts for longer than 5 minutes.  Your skin color is more blue than usual.  Your pulse oximeter shows that you have low oxygen for longer than 5 minutes.  You have a fever.  You feel too tired to breathe normally. Summary  Chronic obstructive pulmonary disease (COPD) is a long-term lung problem.  The way your lungs work will never return to normal. Usually the condition gets worse over time. There are things you can do to keep yourself as healthy as possible.  Take over-the-counter and prescription medicines only as told by your doctor.  If you smoke, stop. Smoking makes the problem worse. This information is not intended to replace advice given to you by your health care provider. Make sure you discuss any questions you have with your health care provider. Document Released: 02/19/2008 Document Revised: 02/08/2016 Document Reviewed: 04/29/2013 Elsevier Interactive Patient Education  2017 [employer].

## 2018-08-11 NOTE — Assessment & Plan Note (Addendum)
Continued smoker. Will send in breo to start daily as well as albuterol rescue inhaler.  Anticipate mild COPD flare. See below.  Would benefit from rpt spirometry if he is agreeable

## 2018-08-11 NOTE — Telephone Encounter (Signed)
Dr Darnell Level had a cancellation 08/11/18 at 11:15 and I called pt and offered the appt to pt. Pt took the appt to see Dr Darnell Level 08/11/18 at 11:15.

## 2018-08-11 NOTE — Progress Notes (Signed)
BP 140/80 (BP Location: Left Arm, Patient Position: Sitting, Cuff Size: Normal)   Pulse (!) 102   Temp 98.5 F (36.9 C) (Oral)   Ht 5\' 9"  (1.753 m)   Wt 163 lb (73.9 kg)   SpO2 97%   BMI 24.07 kg/m    CC: cough, fever Subjective:    Patient ID: Troy [name], male    DOB: 09-Apr-1933, 84 y.o.   MRN: [id number]  HPI: Troy [name] is a 84 y.o. male presenting on 08/11/2018 for Cough (C/o cough, feeling tired and having fevers. Sxs started about 1.5 wks ago. )   1.5 wk h/o malaise. Had some chills several days ago. No fevers, abd pain, chest pain, not increased cough compared to normal, no increase in sputum production. No HA, sore throat, body aches, ear or tooth pain. No dizziness or unsteadiness. He specifically denies fever or body aches to me.   Chronic dyspnea and cough from COPD. Continued smoker.  Doesn't regularly use albuterol. Not on any other respiratory medication.   Upcoming trip to [place]. Leaving tomorrow night. Wanted to get checked out.   Relevant past medical, surgical, family and social history reviewed and updated as indicated. Interim medical history since our last visit reviewed. Allergies and medications reviewed and updated. Outpatient Medications Prior to Visit  Medication Sig Dispense Refill  . aspirin 81 MG tablet Take 81 mg by mouth daily.     . fluticasone (FLONASE) 50 MCG/ACT nasal spray Place 2 sprays into both nostrils daily. 16 g 1  . isosorbide mononitrate (IMDUR) 30 MG 24 hr tablet Take 0.5 tablets (15 mg total) by mouth daily. 45 tablet 4  . magnesium hydroxide (MILK OF MAGNESIA) 400 MG/5ML suspension Take 15 mLs by mouth daily as needed for moderate constipation. 360 mL 0  . metFORMIN (GLUCOPHAGE) 500 MG tablet TAKE 1 TABLET BY MOUTH TWO  TIMES DAILY WITH MEALS 180 tablet 4  . metoprolol tartrate (LOPRESSOR) 25 MG tablet TAKE ONE-HALF TABLET BY  MOUTH TWO TIMES DAILY 90 tablet 3  . nystatin (MYCOSTATIN/NYSTOP) powder Apply topically 3  (three) times daily. 45 g 0  . omeprazole (PRILOSEC) 40 MG capsule Take 1 capsule (40 mg total) by mouth daily. 90 capsule 3  . simvastatin (ZOCOR) 40 MG tablet Take 1 tablet (40 mg total) by mouth at bedtime. 90 tablet 4  . albuterol (PROVENTIL HFA;VENTOLIN HFA) 108 (90 Base) MCG/ACT inhaler Inhale 2 puffs into the lungs every 6 (six) hours as needed for wheezing or shortness of breath. 1 Inhaler 2   No facility-administered medications prior to visit.      Per HPI unless specifically indicated in ROS section below Review of Systems     Objective:    BP 140/80 (BP Location: Left Arm, Patient Position: Sitting, Cuff Size: Normal)   Pulse (!) 102   Temp 98.5 F (36.9 C) (Oral)   Ht 5\' 9"  (1.753 m)   Wt 163 lb (73.9 kg)   SpO2 97%   BMI 24.07 kg/m   Wt Readings from Last 3 Encounters:  08/11/18 163 lb (73.9 kg)  07/07/18 160 lb 8 oz (72.8 kg)  06/30/18 162 lb 12 oz (73.8 kg)    Physical Exam  Constitutional: He appears well-developed and well-nourished. No distress.  HENT:  Mouth/Throat: Oropharynx is clear and moist. No oropharyngeal exudate.  Cardiovascular: Normal rate, regular rhythm and normal heart sounds.  No murmur heard. Pulmonary/Chest: Effort normal. No respiratory distress. He has decreased breath sounds (tight  throughout, especially decreased LLL). He has no wheezes. He has rhonchi (LLL) in the left lower field.  After alb/atrovent neb - improved air movement, new mild coarse wheezing throughout, LLL rhonchi clear up  Musculoskeletal: He exhibits no edema.  Nursing note and vitals reviewed.     Assessment & Plan:   Problem List Items Addressed This Visit    Tobacco use    Continued smoker. Not interested in quitting.       COPD exacerbation (HCC) - Primary    Anticipate mild COPD exacerbation - treat with short prednisone taper as well as doxycycline 1 wk course.  Update if not improving with treatment.  Using steroids cautiously in h/o DM and albuterol  cautiously in h/o SVT      Relevant Medications   ipratropium-albuterol (DUONEB) 0.5-2.5 (3) MG/3ML nebulizer solution 3 mL (Completed)   albuterol (PROVENTIL HFA;VENTOLIN HFA) 108 (90 Base) MCG/ACT inhaler   predniSONE (DELTASONE) 20 MG tablet   fluticasone furoate-vilanterol (BREO ELLIPTA) 100-25 MCG/INH AEPB   COPD (chronic obstructive pulmonary disease) (HCC)    Continued smoker. Will send in breo to start daily as well as albuterol rescue inhaler.  Anticipate mild COPD flare. See below.  Would benefit from rpt spirometry if he is agreeable      Relevant Medications   ipratropium-albuterol (DUONEB) 0.5-2.5 (3) MG/3ML nebulizer solution 3 mL (Completed)   albuterol (PROVENTIL HFA;VENTOLIN HFA) 108 (90 Base) MCG/ACT inhaler   predniSONE (DELTASONE) 20 MG tablet   fluticasone furoate-vilanterol (BREO ELLIPTA) 100-25 MCG/INH AEPB       Meds ordered this encounter  Medications  . ipratropium-albuterol (DUONEB) 0.5-2.5 (3) MG/3ML nebulizer solution 3 mL  . albuterol (PROVENTIL HFA;VENTOLIN HFA) 108 (90 Base) MCG/ACT inhaler    Sig: Inhale 2 puffs into the lungs every 6 (six) hours as needed for wheezing or shortness of breath.    Dispense:  1 Inhaler    Refill:  6  . predniSONE (DELTASONE) 20 MG tablet    Sig: Take two tablets daily for 3 days followed by one tablet daily for 4 days    Dispense:  10 tablet    Refill:  0  . fluticasone furoate-vilanterol (BREO ELLIPTA) 100-25 MCG/INH AEPB    Sig: Inhale 1 puff into the lungs daily.    Dispense:  1 each    Refill:  6  . doxycycline (VIBRA-TABS) 100 MG tablet    Sig: Take 1 tablet (100 mg total) by mouth 2 (two) times daily.    Dispense:  14 tablet    Refill:  0   No orders of the defined types were placed in this encounter.   Follow up plan: Return in about 1 month (around 09/10/2018) for follow up visit.  Troy Bush, MD

## 2018-08-11 NOTE — Assessment & Plan Note (Signed)
Continued smoker. Not interested in quitting.

## 2018-08-11 NOTE — Assessment & Plan Note (Addendum)
Anticipate mild COPD exacerbation - treat with short prednisone taper as well as doxycycline 1 wk course.  Update if not improving with treatment.  Using steroids cautiously in h/o DM and albuterol cautiously in h/o SVT

## 2018-08-12 ENCOUNTER — Ambulatory Visit: Payer: Medicare Other | Admitting: Family Medicine

## 2018-08-25 NOTE — Progress Notes (Deleted)
There were no vitals taken for this visit.   CC: foot issue Subjective:    Patient ID: Troy [name], male    DOB: 1933-06-30, 84 y.o.   MRN: [id number]  HPI: Troy [name] is a 84 y.o. male presenting on 08/26/2018 for No chief complaint on file.   ***  Relevant past medical, surgical, family and social history reviewed and updated as indicated. Interim medical history since our last visit reviewed. Allergies and medications reviewed and updated. Outpatient Medications Prior to Visit  Medication Sig Dispense Refill  . albuterol (PROVENTIL HFA;VENTOLIN HFA) 108 (90 Base) MCG/ACT inhaler Inhale 2 puffs into the lungs every 6 (six) hours as needed for wheezing or shortness of breath. 1 Inhaler 6  . aspirin 81 MG tablet Take 81 mg by mouth daily.     [name] doxycycline (VIBRA-TABS) 100 MG tablet Take 1 tablet (100 mg total) by mouth 2 (two) times daily. 14 tablet 0  . fluticasone (FLONASE) 50 MCG/ACT nasal spray Place 2 sprays into both nostrils daily. 16 g 1  . fluticasone furoate-vilanterol (BREO ELLIPTA) 100-25 MCG/INH AEPB Inhale 1 puff into the lungs daily. 1 each 6  . isosorbide mononitrate (IMDUR) 30 MG 24 hr tablet Take 0.5 tablets (15 mg total) by mouth daily. 45 tablet 4  . magnesium hydroxide (MILK OF MAGNESIA) 400 MG/5ML suspension Take 15 mLs by mouth daily as needed for moderate constipation. 360 mL 0  . metFORMIN (GLUCOPHAGE) 500 MG tablet TAKE 1 TABLET BY MOUTH TWO  TIMES DAILY WITH MEALS 180 tablet 4  . metoprolol tartrate (LOPRESSOR) 25 MG tablet TAKE ONE-HALF TABLET BY  MOUTH TWO TIMES DAILY 90 tablet 3  . nystatin (MYCOSTATIN/NYSTOP) powder Apply topically 3 (three) times daily. 45 g 0  . omeprazole (PRILOSEC) 40 MG capsule Take 1 capsule (40 mg total) by mouth daily. 90 capsule 3  . predniSONE (DELTASONE) 20 MG tablet Take two tablets daily for 3 days followed by one tablet daily for 4 days 10 tablet 0  . simvastatin (ZOCOR) 40 MG tablet Take 1 tablet (40 mg total)  by mouth at bedtime. 90 tablet 4   No facility-administered medications prior to visit.      Per HPI unless specifically indicated in ROS section below Review of Systems     Objective:    There were no vitals taken for this visit.  Wt Readings from Last 3 Encounters:  08/11/18 163 lb (73.9 kg)  07/07/18 160 lb 8 oz (72.8 kg)  06/30/18 162 lb 12 oz (73.8 kg)    Physical Exam Results for orders placed or performed in visit on 06/30/18  Microalbumin / creatinine urine ratio  Result Value Ref Range   Microalb, Ur 7.0 (H) 0.0 - 1.9 mg/dL   Creatinine,U 156.0 mg/dL   Microalb Creat Ratio 4.5 0.0 - 30.0 mg/g  CBC with Differential/Platelet  Result Value Ref Range   WBC 8.9 4.0 - 10.5 K/uL   RBC 4.21 (L) 4.22 - 5.81 Mil/uL   Hemoglobin 12.2 (L) 13.0 - 17.0 g/dL   HCT 37.1 (L) 39.0 - 52.0 %   MCV 88.3 78.0 - 100.0 fl   MCHC 32.8 30.0 - 36.0 g/dL   RDW 16.1 (H) 11.5 - 15.5 %   Platelets 162.0 150.0 - 400.0 K/uL   Neutrophils Relative % 67.0 43.0 - 77.0 %   Lymphocytes Relative 20.4 12.0 - 46.0 %   Monocytes Relative 11.7 3.0 - 12.0 %   Eosinophils Relative 0.6 0.0 -  5.0 %   Basophils Relative 0.3 0.0 - 3.0 %   Neutro Abs 6.0 1.4 - 7.7 K/uL   Lymphs Abs 1.8 0.7 - 4.0 K/uL   Monocytes Absolute 1.0 0.1 - 1.0 K/uL   Eosinophils Absolute 0.1 0.0 - 0.7 K/uL   Basophils Absolute 0.0 0.0 - 0.1 K/uL  Hemoglobin A1c  Result Value Ref Range   Hgb A1c MFr Bld 6.5 4.6 - 6.5 %  TSH  Result Value Ref Range   TSH 1.67 0.35 - 4.50 uIU/mL  Lipid panel  Result Value Ref Range   Cholesterol 120 0 - 200 mg/dL   Triglycerides 123.0 0.0 - 149.0 mg/dL   HDL 35.40 (L) >39.00 mg/dL   VLDL 24.6 0.0 - 40.0 mg/dL   LDL Cholesterol 60 0 - 99 mg/dL   Total CHOL/HDL Ratio 3    NonHDL 84.53   Comprehensive metabolic panel  Result Value Ref Range   Sodium 137 135 - 145 mEq/L   Potassium 4.0 3.5 - 5.1 mEq/L   Chloride 103 96 - 112 mEq/L   CO2 27 19 - 32 mEq/L   Glucose, Bld 105 (H) 70 - 99 mg/dL     BUN 25 (H) 6 - 23 mg/dL   Creatinine, Ser 1.26 0.40 - 1.50 mg/dL   Total Bilirubin 0.6 0.2 - 1.2 mg/dL   Alkaline Phosphatase 123 (H) 39 - 117 U/L   AST 11 0 - 37 U/L   ALT 13 0 - 53 U/L   Total Protein 7.6 6.0 - 8.3 g/dL   Albumin 4.3 3.5 - 5.2 g/dL   Calcium 9.8 8.4 - 10.5 mg/dL   GFR 58.10 (L) >60.00 mL/min      Assessment & Plan:   Problem List Items Addressed This Visit    None       No orders of the defined types were placed in this encounter.  No orders of the defined types were placed in this encounter.   Follow up plan: No follow-ups on file.  [name], MD

## 2018-08-26 ENCOUNTER — Ambulatory Visit: Payer: Medicare Other | Admitting: Family Medicine

## 2018-09-24 ENCOUNTER — Telehealth: Payer: Self-pay | Admitting: Family Medicine

## 2018-09-24 NOTE — Telephone Encounter (Signed)
Pt's son want to speak with you concerning his dad and possible come in and met with you without pt being here. Please advise.

## 2018-09-25 NOTE — Telephone Encounter (Signed)
Spoke with son. Concerned that [place] - and seemed to be clear in his recollection of events.  Will call and schedule appt and come in with father to review.

## 2018-10-01 ENCOUNTER — Ambulatory Visit (INDEPENDENT_AMBULATORY_CARE_PROVIDER_SITE_OTHER): Payer: Medicare Other | Admitting: Family Medicine

## 2018-10-01 ENCOUNTER — Encounter: Payer: Self-pay | Admitting: Family Medicine

## 2018-10-01 VITALS — BP 134/70 | HR 67 | Temp 98.2°F | Ht 69.0 in | Wt 162.8 lb

## 2018-10-01 DIAGNOSIS — Z7189 Other specified counseling: Secondary | ICD-10-CM

## 2018-10-01 DIAGNOSIS — E118 Type 2 diabetes mellitus with unspecified complications: Secondary | ICD-10-CM | POA: Diagnosis not present

## 2018-10-01 DIAGNOSIS — R4189 Other symptoms and signs involving cognitive functions and awareness: Secondary | ICD-10-CM

## 2018-10-01 DIAGNOSIS — R413 Other amnesia: Secondary | ICD-10-CM

## 2018-10-01 LAB — POCT GLYCOSYLATED HEMOGLOBIN (HGB A1C): Hemoglobin A1C: 6 % — AB (ref 4.0–5.6)

## 2018-10-01 MED ORDER — SERTRALINE HCL 25 MG PO TABS: 25.0000 mg | ORAL_TABLET | Freq: Every day | ORAL | 6 refills | Status: DC

## 2018-10-01 MED ORDER — MULTIVITAMIN ADULT PO TABS: 1.0000 | ORAL_TABLET | Freq: Every day | ORAL | Status: DC

## 2018-10-01 NOTE — Assessment & Plan Note (Signed)
A1c reassuringly normal. Consider decreasing metformin dosing.

## 2018-10-01 NOTE — Assessment & Plan Note (Signed)
See above. Anticipate component of pseudodementia . Will trial sertraline and reassess symptoms.

## 2018-10-01 NOTE — Assessment & Plan Note (Addendum)
Advanced planning: scanned and reviewed 09/2018. Son [name] is HCPOA. Does not want prolonged life support if terminal condition. From previous discussion: does not want CPR, intubation or life support. Thinks he would be ok with temporary BiPAP if needed.

## 2018-10-01 NOTE — Assessment & Plan Note (Addendum)
Reviewed prior MMSE with patient and son - 40/30 raising some concerns of memory impairment, however ?pseudodementia given depressive symptoms endorsed. I recommended trial antidepressant for 1-2 months then reassess. Start sertraline 25mg  daily - discussed side effects to watch for, to stop if suicidality develops. Denies SI/HI.  I did ask pt/son to return in 2-3 mo f/u visit H/o remote lacunar stroke by MRI (04/2018)

## 2018-10-01 NOTE — Progress Notes (Signed)
BP 134/70 (BP Location: Left Arm, Patient Position: Sitting, Cuff Size: Normal)   Pulse 67   Temp 98.2 F (36.8 C) (Oral)   Ht 5\' 9"  (1.753 m)   Wt 162 lb 12 oz (73.8 kg)   SpO2 96%   BMI 24.03 kg/m    CC: f/u visit Subjective:    Patient ID: Troy [name], male    DOB: January 09, 1933, 85 y.o.   MRN: [id number]  HPI: Troy [name] is a 85 y.o. male presenting on 10/01/2018 for Follow-up (Pt accompanied by his son, [name]. )   See recent office notes and recent phone note for details.  Wife passed away early 07-Jan-2017 from lung cancer.  Live alone, son visits daily and brings dinner. Son manages medications.  Some concern over [place] trouble [place]. Predominantly noted with lack of motivation to answer questioning.   MMSE = 24/30, missed 3 recall, 2 orientation and 1 language (06/2018)  Endorse depression, anhedonia. Some oversleeping, some trouble with appetite. No decreased energy. No guilt. Thinks concentration ok. Some psychomotor depression. Some unsteadiness with movements. Denies SI/HI. Noticing increasing tremors. Mother with h/o tremors.   Advanced directive - has this at home, and son [name] is HCPOA. they bring copy today which was reviewed. Advanced planning: scanned and reviewed 09/2018. Son [name] is HCPOA. Does not want prolonged life support if terminal condition. From previous discussion: does not want CPR, intubation or life support. Thinks he would be ok with temporary BiPAP if needed.       Relevant past medical, surgical, family and social history reviewed and updated as indicated. Interim medical history since our last visit reviewed. Allergies and medications reviewed and updated. Outpatient Medications Prior to Visit  Medication Sig Dispense Refill  . albuterol (PROVENTIL HFA;VENTOLIN HFA) 108 (90 Base) MCG/ACT inhaler Inhale 2 puffs into the lungs every 6 (six) hours as needed for wheezing or shortness of breath. 1  Inhaler 6  . aspirin 81 MG tablet Take 81 mg by mouth daily.     . fluticasone (FLONASE) 50 MCG/ACT nasal spray Place 2 sprays into both nostrils daily. 16 g 1  . fluticasone furoate-vilanterol (BREO ELLIPTA) 100-25 MCG/INH AEPB Inhale 1 puff into the lungs daily. 1 each 6  . isosorbide mononitrate (IMDUR) 30 MG 24 hr tablet Take 0.5 tablets (15 mg total) by mouth daily. 45 tablet 4  . magnesium hydroxide (MILK OF MAGNESIA) 400 MG/5ML suspension Take 15 mLs by mouth daily as needed for moderate constipation. 360 mL 0  . metFORMIN (GLUCOPHAGE) 500 MG tablet TAKE 1 TABLET BY MOUTH TWO  TIMES DAILY WITH MEALS 180 tablet 4  . metoprolol tartrate (LOPRESSOR) 25 MG tablet TAKE ONE-HALF TABLET BY  MOUTH TWO TIMES DAILY 90 tablet 3  . nystatin (MYCOSTATIN/NYSTOP) powder Apply topically 3 (three) times daily. 45 g 0  . omeprazole (PRILOSEC) 40 MG capsule Take 1 capsule (40 mg total) by mouth daily. 90 capsule 3  . simvastatin (ZOCOR) 40 MG tablet Take 1 tablet (40 mg total) by mouth at bedtime. 90 tablet 4  . doxycycline (VIBRA-TABS) 100 MG tablet Take 1 tablet (100 mg total) by mouth 2 (two) times daily. 14 tablet 0  . predniSONE (DELTASONE) 20 MG tablet Take two tablets daily for 3 days followed by one tablet daily for 4 days 10 tablet 0   No facility-administered medications prior to visit.      Per HPI  unless specifically indicated in ROS section below Review of Systems Objective:    BP 134/70 (BP Location: Left Arm, Patient Position: Sitting, Cuff Size: Normal)   Pulse 67   Temp 98.2 F (36.8 C) (Oral)   Ht 5\' 9"  (1.753 m)   Wt 162 lb 12 oz (73.8 kg)   SpO2 96%   BMI 24.03 kg/m   Wt Readings from Last 3 Encounters:  10/01/18 162 lb 12 oz (73.8 kg)  08/11/18 163 lb (73.9 kg)  07/07/18 160 lb 8 oz (72.8 kg)    Physical Exam Vitals signs and nursing note reviewed.  Constitutional:      Appearance: Normal appearance. He is not ill-appearing.  Cardiovascular:     Rate and Rhythm:  Normal rate and regular rhythm.     Pulses: Normal pulses.     Heart sounds: Normal heart sounds. No murmur.  Pulmonary:     Effort: Pulmonary effort is normal. No respiratory distress.     Breath sounds: Normal breath sounds. No wheezing, rhonchi or rales.     Comments: Lung largely clear today Neurological:     Mental Status: He is alert.     Comments: No cogwheel rigidity  Psychiatric:        Mood and Affect: Mood normal.        Behavior: Behavior normal.       Results for orders placed or performed in visit on 10/01/18  POCT glycosylated hemoglobin (Hb A1C)  Result Value Ref Range   Hemoglobin A1C 6.0 (A) 4.0 - 5.6 %   HbA1c POC (<> result, manual entry)     HbA1c, POC (prediabetic range)     HbA1c, POC (controlled diabetic range)     MRI HEAD WITHOUT CONTRAST IMPRESSION: 1. No acute intracranial abnormality. 2. Advanced diffuse confluent white matter disease likely reflects the sequelae of chronic microvascular ischemia. 3. Minimal sinus disease. Electronically Signed   By: San Morelle [name]   On: 04/26/2018 15:48 Assessment & Plan:   Problem List Items Addressed This Visit    Pseudodementia    See above. Anticipate component of pseudodementia . Will trial sertraline and reassess symptoms.       Memory deficit - Primary    Reviewed prior MMSE with patient and son - 36/30 raising some concerns of memory impairment, however ?pseudodementia given depressive symptoms endorsed. I recommended trial antidepressant for 1-2 months then reassess. Start sertraline 25mg  daily - discussed side effects to watch for, to stop if suicidality develops. Denies SI/HI.  I did ask pt/son to return in 2-3 mo f/u visit H/o remote lacunar stroke by MRI (04/2018)      Diabetes mellitus type 2 with complications (HCC)    Q1J reassuringly normal. Consider decreasing metformin dosing.      Relevant Orders   POCT glycosylated hemoglobin (Hb A1C) (Completed)   Advanced care  planning/counseling discussion    Advanced planning: scanned and reviewed 09/2018. Son [name] is HCPOA. Does not want prolonged life support if terminal condition. From previous discussion: does not want CPR, intubation or life support. Thinks he would be ok with temporary BiPAP if needed.           Meds ordered this encounter  Medications  . DISCONTD: sertraline (ZOLOFT) 25 MG tablet    Sig: Take 1 tablet (25 mg total) by mouth daily.    Dispense:  30 tablet    Refill:  6  . sertraline (ZOLOFT) 25 MG tablet    Sig: Take 1  tablet (25 mg total) by mouth daily.    Dispense:  30 tablet    Refill:  6  . Multiple Vitamins-Minerals (MULTIVITAMIN ADULT) TABS    Sig: Take 1 tablet by mouth daily.   Orders Placed This Encounter  Procedures  . POCT glycosylated hemoglobin (Hb A1C)    Follow up plan: Return in about 3 months (around 12/31/2018) for follow up visit.  [name], MD

## 2018-10-01 NOTE — Patient Instructions (Signed)
Memory testing in October concerning for some memory trouble - but this could be from depression so let's treat that first. Take sertraline 25mg  daily in the morning. This can take 3-4 weeks to take full effect. Return in 2-3 months for follow up visit.

## 2018-10-12 ENCOUNTER — Other Ambulatory Visit: Payer: Self-pay | Admitting: Family Medicine

## 2018-11-09 ENCOUNTER — Encounter: Payer: Self-pay | Admitting: Family Medicine

## 2018-11-09 ENCOUNTER — Ambulatory Visit (INDEPENDENT_AMBULATORY_CARE_PROVIDER_SITE_OTHER): Payer: Medicare Other | Admitting: Family Medicine

## 2018-11-09 VITALS — BP 140/58 | HR 81 | Temp 99.1°F | Ht 69.0 in | Wt 160.2 lb

## 2018-11-09 DIAGNOSIS — K279 Peptic ulcer, site unspecified, unspecified as acute or chronic, without hemorrhage or perforation: Secondary | ICD-10-CM | POA: Diagnosis not present

## 2018-11-09 DIAGNOSIS — R4189 Other symptoms and signs involving cognitive functions and awareness: Secondary | ICD-10-CM | POA: Diagnosis not present

## 2018-11-09 DIAGNOSIS — R413 Other amnesia: Secondary | ICD-10-CM | POA: Diagnosis not present

## 2018-11-09 MED ORDER — MEMANTINE HCL 5 MG PO TABS
5.0000 mg | ORAL_TABLET | Freq: Two times a day (BID) | ORAL | 3 refills | Status: DC
Start: 2018-11-09 — End: 2019-01-04

## 2018-11-09 MED ORDER — SERTRALINE HCL 25 MG PO TABS: 25.0000 mg | ORAL_TABLET | Freq: Every day | ORAL | 3 refills | Status: DC

## 2018-11-09 NOTE — Patient Instructions (Signed)
Continue sertraline 25mg  daily. Start namenda 5mg  daily for 1 week then increase to 5mg  twice daily - this is a memory medicine.  Return in 2-3 months for follow up visit.

## 2018-11-09 NOTE — Progress Notes (Addendum)
BP (!) 140/58   Pulse 81   Temp 99.1 F (37.3 C)   Ht 5\' 9"  (1.753 m)   Wt 160 lb 3 oz (72.7 kg)   SpO2 97%   BMI 23.66 kg/m    CC: 1 mo f/u visit Subjective:    Patient ID: Troy [name], male    DOB: 1932/10/01, 85 y.o.   MRN: [id number]  HPI: Troy [name] is a 85 y.o. male presenting on 11/09/2018 for Medication follow up   Here with son [name]) and daughter Santiago Glad).  See prior note for details.  Presumed pseudodementia - started on sertraline 25mg  daily.  MMSE last visit 24/30. Difficult to distinguish cognitive impairment in setting of possible depression.   Son feels short term memory has worsened in the past month.      Relevant past medical, surgical, family and social history reviewed and updated as indicated. Interim medical history since our last visit reviewed. Allergies and medications reviewed and updated. Outpatient Medications Prior to Visit  Medication Sig Dispense Refill  . albuterol (PROVENTIL HFA;VENTOLIN HFA) 108 (90 Base) MCG/ACT inhaler Inhale 2 puffs into the lungs every 6 (six) hours as needed for wheezing or shortness of breath. 1 Inhaler 6  . aspirin 81 MG tablet Take 81 mg by mouth daily.     . fluticasone (FLONASE) 50 MCG/ACT nasal spray Place 2 sprays into both nostrils daily. 16 g 1  . fluticasone furoate-vilanterol (BREO ELLIPTA) 100-25 MCG/INH AEPB Inhale 1 puff into the lungs daily. 1 each 6  . isosorbide mononitrate (IMDUR) 30 MG 24 hr tablet TAKE ONE-HALF TABLET BY  MOUTH DAILY 45 tablet 3  . magnesium hydroxide (MILK OF MAGNESIA) 400 MG/5ML suspension Take 15 mLs by mouth daily as needed for moderate constipation. 360 mL 0  . metFORMIN (GLUCOPHAGE) 500 MG tablet TAKE 1 TABLET BY MOUTH TWO  TIMES DAILY WITH MEALS 180 tablet 3  . metoprolol tartrate (LOPRESSOR) 25 MG tablet TAKE ONE-HALF TABLET BY  MOUTH TWO TIMES DAILY 90 tablet 3  . Multiple Vitamins-Minerals (MULTIVITAMIN ADULT) TABS Take 1 tablet by mouth daily.    [name] omeprazole  (PRILOSEC) 40 MG capsule Take 1 capsule (40 mg total) by mouth daily. 90 capsule 3  . simvastatin (ZOCOR) 40 MG tablet TAKE 1 TABLET BY MOUTH AT  BEDTIME 90 tablet 3  . sertraline (ZOLOFT) 25 MG tablet Take 1 tablet (25 mg total) by mouth daily. 30 tablet 6  . nystatin (MYCOSTATIN/NYSTOP) powder Apply topically 3 (three) times daily. (Patient not taking: Reported on 11/09/2018) 45 g 0   No facility-administered medications prior to visit.      Per HPI unless specifically indicated in ROS section below Review of Systems Objective:    BP (!) 140/58   Pulse 81   Temp 99.1 F (37.3 C)   Ht 5\' 9"  (1.753 m)   Wt 160 lb 3 oz (72.7 kg)   SpO2 97%   BMI 23.66 kg/m   Wt Readings from Last 3 Encounters:  11/09/18 160 lb 3 oz (72.7 kg)  10/01/18 162 lb 12 oz (73.8 kg)  08/11/18 163 lb (73.9 kg)    Physical Exam Vitals signs and nursing note reviewed.  Constitutional:      Appearance: Normal appearance. He is not ill-appearing.  Cardiovascular:     Rate and Rhythm: Normal rate and regular rhythm.     Pulses: Normal pulses.     Heart sounds: Normal heart sounds. No murmur.  Pulmonary:  Effort: Pulmonary effort is normal. No respiratory distress.     Breath sounds: Normal breath sounds. No wheezing, rhonchi or rales.  Neurological:     Mental Status: He is alert.  Psychiatric:        Attention and Perception: Attention normal.        Mood and Affect: Mood normal. Affect is blunt.        Speech: Speech normal.        Behavior: Behavior normal.       Results for orders placed or performed in visit on 10/01/18  POCT glycosylated hemoglobin (Hb A1C)  Result Value Ref Range   Hemoglobin A1C 6.0 (A) 4.0 - 5.6 %   HbA1c POC (<> result, manual entry)     HbA1c, POC (prediabetic range)     HbA1c, POC (controlled diabetic range)     Depression screen [facility] 2/9 11/09/2018 07/07/2018 10/15/2017 10/01/2016 04/29/2012  Decreased Interest 3 0 0 0 0  Down, Depressed, Hopeless 2 0 0 0 0  PHQ -  2 Score 5 0 0 0 0  Altered sleeping 2 - - - -  Tired, decreased energy 3 - - - -  Change in appetite 3 - - - -  Feeling bad or failure about yourself  2 - - - -  Trouble concentrating 0 - - - -  Moving slowly or fidgety/restless 3 - - - -  Suicidal thoughts 1 - - - -  PHQ-9 Score 19 - - - -  --> son helped fill out form Assessment & Plan:   Problem List Items Addressed This Visit    PUD (peptic ulcer disease)    In h/o this, avoid aricept. See above.       Pseudodementia    Pt continues to deny mood concerns.  PHQ9 done today = 19.  Continue low dose sertraline 25mg  dailiy.       Memory deficit - Primary    Son endorses ongoing concerns with memory, feels worsening - will start namenda 5mg  daily for 1 wk then increase to 5mg  bid. Avoid aricept in PUD hx.           Meds ordered this encounter  Medications  . memantine (NAMENDA) 5 MG tablet    Sig: Take 1 tablet (5 mg total) by mouth 2 (two) times daily. First week take 1 tablet daily.    Dispense:  60 tablet    Refill:  3  . sertraline (ZOLOFT) 25 MG tablet    Sig: Take 1 tablet (25 mg total) by mouth daily.    Dispense:  90 tablet    Refill:  3   No orders of the defined types were placed in this encounter.   Follow up plan: Return in about 3 months (around 02/07/2019) for follow up visit.  [name], MD

## 2018-11-09 NOTE — Assessment & Plan Note (Signed)
Pt continues to deny mood concerns.  PHQ9 done today = 19.  Continue low dose sertraline 25mg  dailiy.

## 2018-11-09 NOTE — Assessment & Plan Note (Signed)
In h/o this, avoid aricept. See above.

## 2018-11-09 NOTE — Assessment & Plan Note (Signed)
Son endorses ongoing concerns with memory, feels worsening - will start namenda 5mg  daily for 1 wk then increase to 5mg  bid. Avoid aricept in PUD hx.

## 2019-01-01 ENCOUNTER — Ambulatory Visit (INDEPENDENT_AMBULATORY_CARE_PROVIDER_SITE_OTHER): Payer: Medicare Other | Admitting: Family Medicine

## 2019-01-01 ENCOUNTER — Encounter: Payer: Self-pay | Admitting: Family Medicine

## 2019-01-01 VITALS — Ht 69.0 in

## 2019-01-01 DIAGNOSIS — J441 Chronic obstructive pulmonary disease with (acute) exacerbation: Secondary | ICD-10-CM | POA: Diagnosis not present

## 2019-01-01 MED ORDER — PREDNISONE 10 MG PO TABS: ORAL_TABLET | ORAL | 0 refills | Status: DC

## 2019-01-01 NOTE — Assessment & Plan Note (Signed)
Anticipate mild copd exacerbation with increased cough, sputum production, and dyspnea. Treat with low dose prednisone taper. Red flags to seek further care reviewed. Pt agrees with plan.  Seems he's not been using breo inhaler.

## 2019-01-01 NOTE — Progress Notes (Signed)
Virtual visit completed through [employer]. Due to national recommendations of social distancing due to [place] 19, a virtual visit is felt to be most appropriate for this patient at this time. Interactive audio and video telecommunications were attempted between myself and Troy [name], however failed due to patient having technical difficulties OR patient did not have access to video capability.  We continued and completed visit with audio only.   Patient location: home Provider location: [place] at [facility], office If any vitals were documented, they were collected by patient at home unless specified below.    Ht 5\' 9"  (1.753 m)   BMI 23.66 kg/m    CC: fatigue Subjective:    Patient ID: Troy [name], male    DOB: 06-06-1933, 85 y.o.   MRN: [id number]  HPI: Troy [name] is a 85 y.o. male presenting on 01/01/2019 for Fatigue (C/o fatigue, runny nose, mild cough and chills. Sxs started a few days ago. )   Several day h/o mild cough, rhinorrhea, chills. Increased fatigue noted. Cough is productive of mucous. Increased wheezing but no chest congestion, head congestion or ear or tooth pain. Mild ST, PNdrainage, itchy watery eyes. Has been out on the porch recently.   Not using flonase spray or albuterol inhaler regularly.   Chronic dyspnea and cough from COPD. Continued smoker.  Doesn't regularly use albuterol. Not on any other respiratory medication.      Relevant past medical, surgical, family and social history reviewed and updated as indicated. Interim medical history since our last visit reviewed. Allergies and medications reviewed and updated. Outpatient Medications Prior to Visit  Medication Sig Dispense Refill  . albuterol (PROVENTIL HFA;VENTOLIN HFA) 108 (90 Base) MCG/ACT inhaler Inhale 2 puffs into the lungs every 6 (six) hours as needed for wheezing or shortness of breath. 1 Inhaler 6  . aspirin 81 MG tablet Take 81 mg by mouth daily.     . fluticasone (FLONASE)  50 MCG/ACT nasal spray Place 2 sprays into both nostrils daily. 16 g 1  . fluticasone furoate-vilanterol (BREO ELLIPTA) 100-25 MCG/INH AEPB Inhale 1 puff into the lungs daily. 1 each 6  . isosorbide mononitrate (IMDUR) 30 MG 24 hr tablet TAKE ONE-HALF TABLET BY  MOUTH DAILY 45 tablet 3  . magnesium hydroxide (MILK OF MAGNESIA) 400 MG/5ML suspension Take 15 mLs by mouth daily as needed for moderate constipation. 360 mL 0  . memantine (NAMENDA) 5 MG tablet Take 1 tablet (5 mg total) by mouth 2 (two) times daily. First week take 1 tablet daily. 60 tablet 3  . metFORMIN (GLUCOPHAGE) 500 MG tablet TAKE 1 TABLET BY MOUTH TWO  TIMES DAILY WITH MEALS 180 tablet 3  . metoprolol tartrate (LOPRESSOR) 25 MG tablet TAKE ONE-HALF TABLET BY  MOUTH TWO TIMES DAILY 90 tablet 3  . Multiple Vitamins-Minerals (MULTIVITAMIN ADULT) TABS Take 1 tablet by mouth daily.    [name] nystatin (MYCOSTATIN/NYSTOP) powder Apply topically 3 (three) times daily. 45 g 0  . omeprazole (PRILOSEC) 40 MG capsule Take 1 capsule (40 mg total) by mouth daily. 90 capsule 3  . sertraline (ZOLOFT) 25 MG tablet Take 1 tablet (25 mg total) by mouth daily. 90 tablet 3  . simvastatin (ZOCOR) 40 MG tablet TAKE 1 TABLET BY MOUTH AT  BEDTIME 90 tablet 3   No facility-administered medications prior to visit.      Per HPI unless specifically indicated in ROS section below Review of Systems Objective:    Ht 5\' 9"  (1.753 m)  BMI 23.66 kg/m   Wt Readings from Last 3 Encounters:  11/09/18 160 lb 3 oz (72.7 kg)  10/01/18 162 lb 12 oz (73.8 kg)  08/11/18 163 lb (73.9 kg)     Physical exam: Pulm: speaks in complete sentences without increased work of breathing. No wheezing appreciated over the phone     Results for orders placed or performed in visit on 10/01/18  POCT glycosylated hemoglobin (Hb A1C)  Result Value Ref Range   Hemoglobin A1C 6.0 (A) 4.0 - 5.6 %   HbA1c POC (<> result, manual entry)     HbA1c, POC (prediabetic range)     HbA1c,  POC (controlled diabetic range)     Assessment & Plan:   Problem List Items Addressed This Visit    COPD exacerbation ([place]) - Primary    Anticipate mild copd exacerbation with increased cough, sputum production, and dyspnea. Treat with low dose prednisone taper. Red flags to seek further care reviewed. Pt agrees with plan.  Seems he's not been using breo inhaler.       Relevant Medications   predniSONE (DELTASONE) 10 MG tablet       Meds ordered this encounter  Medications  . predniSONE (DELTASONE) 10 MG tablet    Sig: Take three tablets for 3 days followed by two tablets for 3 days followed by one tablet for 3 days    Dispense:  18 tablet    Refill:  0   No orders of the defined types were placed in this encounter.   Follow up plan: No follow-ups on file.  [name], MD

## 2019-01-04 ENCOUNTER — Telehealth: Payer: Self-pay | Admitting: *Deleted

## 2019-01-04 MED ORDER — MEMANTINE HCL 10 MG PO TABS: 10.0000 mg | ORAL_TABLET | Freq: Two times a day (BID) | ORAL | 6 refills | Status: DC

## 2019-01-04 NOTE — Telephone Encounter (Signed)
Patient's daughter Santiago Glad on [place]).called wanting to know why her dad was put on prednisone. Santiago Glad stated that she and her brother take care of him and handle his medications. Santiago Glad stated that her brother was over at their dad's house and asked him why he had Prednisone and he told him that he didn't know. Santiago Glad wanted to know why he was put on Prednisone. Santiago Glad was advised of the visit with her dad on 01/01/19. Santiago Glad stated patient told them he does not remember how he got the medication and why he is taking it. Santiago Glad stated that they put a tracker on his phone and it showed that he drove around for about 2 hours one day lat week and he drove in circles and he does not even remember that. Santiago Glad stated that the memory medication is not working and she needs some advice.

## 2019-01-04 NOTE — Telephone Encounter (Signed)
Spoke with daughter who expresses below concerns- worried namenda hasn't helped at all. Will increase to full dose, reassess in 2-3 weeks. Consider neuro eval if no improvement. Again discussed concerns with safety with ongoing driving in memory trouble - they will broach this concern with their father.

## 2019-01-22 ENCOUNTER — Telehealth: Payer: Self-pay | Admitting: Family Medicine

## 2019-01-22 NOTE — Telephone Encounter (Signed)
Received call from the nurse line re: uri symptoms  Pt is experiencing chills and cough  No body aches/shortness of breath or wheezing  No known exposure to covid   Nurse triaged- no respiratory distress Adv home symptomatic care Watch closely for sob/severe cough or high fever unresponsive to antipyretics  inst to go to ED if this develops Otherwise call call for virtual visit in the am   Will cc to PCP

## 2019-01-25 ENCOUNTER — Telehealth: Payer: Self-pay

## 2019-01-25 NOTE — Telephone Encounter (Signed)
Please refer to separate phone encounter sent to PCP from the on call regarding follow up for patient.   PCP made aware and phone encounter to follow up with patient made today by CMA. Refer to 01/25/19 follow up conversation.

## 2019-01-25 NOTE — Telephone Encounter (Signed)
[place] Patient Name: Troy [name] Gender: Male DOB: 04-22-1933 Age: 85 Y 69 M 13 D Return Phone Number: [phone] (Primary) Address: City/State/Zip: [place] [address] Client [facility] Night - Client Client Site [place] Physician AA - PHYSICIAN, Verita Schneiders- MD Contact Type Call Who Is Calling Patient / Member / Family / Caregiver Call Type Triage / Clinical Relationship To Patient Self Return Phone Number [phone] (Primary) Chief Complaint [place] or [place] Reason for Call Symptomatic / Request for Health Information Initial Comment The pt. is currently experiencing chills and a runny nose. No other symptoms were reported. Translation No Nurse Assessment Nurse: Jimmye Norman, RN, Whitney Date/Time (Eastern Time): 01/22/2019 5:59:02 PM Confirm and document reason for call. If symptomatic, describe symptoms. ---Caller states he is currently experiencing chills and a runny nose that started today. Unknown temperature does not have a thermometer, does not feel like he has a fever. Has the patient had close contact with a person known or suspected to have the novel coronavirus illness OR traveled / lives in area with major community spread (including international travel) in the last 14 days from the onset of symptoms? * If Asymptomatic, screen for exposure and travel within the last 14 days. ---No Does the patient have any new or worsening symptoms? ---Yes Will a triage be completed? ---Yes Related visit to physician within the last 2 weeks? ---No Does the PT have any chronic conditions? (i.e. diabetes, asthma, this includes High risk factors for pregnancy, etc.) ---No Is this a behavioral health or substance abuse call? ---No Guidelines Guideline Title Affirmed Question Affirmed Notes Nurse Date/Time  (Eastern Time) Coronavirus (COVID-19) - Diagnosed or Suspected HIGH RISK patient (e.g., age > 77 years, diabetes, heart or lung disease, weak immune system) Jimmye Norman, RN, Whitney 01/22/2019 6:01:17 PM Disp. Time Eilene Ghazi Time) Disposition Final User 01/22/2019 6:05:01 PM Paged On Call back to [facility], [place], Loree Fee 01/22/2019 6:04:24 PM Call PCP Now Yes Jimmye Norman, RN, Whitney PLEASE NOTE: All timestamps contained within this report are represented as [place] Standard Time. CONFIDENTIALTY NOTICE: This fax transmission is intended only for the addressee. It contains information that is legally privileged, confidential or otherwise protected from use or disclosure. If you are not the intended recipient, you are strictly prohibited from reviewing, disclosing, copying using or disseminating any of this information or taking any action in reliance on or regarding this information. If you have received this fax in error, please notify us immediately by telephone so that we can arrange for its return to [place]. Phone: [phone], Toll-Free: [phone], Fax: [phone] Page: 2 of 2 Call Id: [id number] [place] Disagree/Comply Comply Caller Understands Yes PreDisposition InappropriateToAsk Care Advice Given Per Guideline CALL PCP NOW: * You need to discuss this with your doctor (or NP/PA). Comments User: Myriam Forehand, RN Date/Time Eilene Ghazi Time): 01/22/2019 6:24:16 PM RN relayed provider instruction to caller. Caller verbalized understanding Paging DoctorName Phone DateTime Result/Outcome Message Type Notes Loura Pardon - [place] [phone] 01/22/2019 6:05:01 PM Paged On Call Back to Call Center Doctor Paged please call w=Whitney RN with access nurse [phone] Loura Pardon - MD 01/22/2019 6:11:41 PM Spoke with On Call - General Message Result RN relayed triage outcome to Dr. Glori Bickers. Instructed if he is not running a high fever, not short of breath, treat at home treat with fluids  and tylenol, keep [place] posted. isolate, wear a mask. If  he wants to have a virtual visit in the morning, call the office and they can get him scheduled.

## 2019-01-25 NOTE — Telephone Encounter (Signed)
Spoke pt ask how he was doing. Says cough is improving. Denies any other sxs (ie, fever, chills, SOB,etc.)

## 2019-01-25 NOTE — Telephone Encounter (Signed)
plz call today for an update on respiratory symptoms -cough with chills. Ensure no fever, dyspnea, wheezing. If none, continue home symptomatic care.

## 2019-02-02 ENCOUNTER — Other Ambulatory Visit: Payer: Self-pay

## 2019-02-02 NOTE — Patient Outreach (Signed)
[facility] [facility]) Care Management  02/02/2019  Troy [name] 1933-06-16 [id number]   Medication Adherence call to Mrs. Troy [name] [facility] Ident[employer] Verify spoke with patient he is due on Simvastatin 40 mg and Metformin 500 mg patient explain he is taking 1 tablet daily on both medication and has plenty until the end of the month and will order from [facility]. Mr. Stairs is showing past due under [place].    [place] Management Direct Dial [phone]  Fax [phone] [email or web address]

## 2019-02-05 ENCOUNTER — Telehealth: Payer: Self-pay | Admitting: *Deleted

## 2019-02-05 DIAGNOSIS — R413 Other amnesia: Secondary | ICD-10-CM

## 2019-02-05 DIAGNOSIS — R4189 Other symptoms and signs involving cognitive functions and awareness: Secondary | ICD-10-CM

## 2019-02-05 NOTE — Telephone Encounter (Signed)
Patient's daughter called stating that she wanted to give Dr. Danise Mina an update on how her dad is doing since the medication change. Santiago Glad stated that there has not been any improvement with patient's memory since the increase of his memory medication.Santiago Glad wants to know what the next step would be?

## 2019-02-09 NOTE — Telephone Encounter (Signed)
Already on max dose namenda. Avoiding aricept due to GI history.  Would offer increase in sertraline antidepressant vs neurology evaluation as next steps.

## 2019-02-09 NOTE — Telephone Encounter (Signed)
Spoke with Santiago Glad relaying Dr. Synthia Innocent message.  Expresses her thanks.

## 2019-02-09 NOTE — Telephone Encounter (Signed)
Neurology referral placed

## 2019-02-09 NOTE — Addendum Note (Signed)
Addended by: [name] on: 02/09/2019 09:30 AM   Modules accepted: Orders

## 2019-02-09 NOTE — Telephone Encounter (Signed)
Spoke with pt's daughter, Santiago Glad (on dpr), relaying Dr. Synthia Innocent message.  She verbalizes understanding but declines to do anymore pills at this time. Says they [the family] would rather have some type of further eval done with pt at this point. Santiago Glad gives permission to lvm @ [phone] with Dr. Synthia Innocent response.]

## 2019-02-17 ENCOUNTER — Other Ambulatory Visit: Payer: Self-pay

## 2019-02-17 NOTE — Patient Outreach (Signed)
[place] [facility]) Care Management  02/17/2019  SARP VERNIER [birth year] [id number]   Medication Adherence call to Mr. [place] spoke with  Patient he is past due on Simvastatin 40 mg and Metformin 500 mg patient is taking 1 tablet daily on Simvastatin and Metformin he is taking 2 tablets daily patient ask if we can call [facility] an order both medication,[facility] will mail out with in 5-7 days. Mr. Donato Schultz is showing past due under [place].   [place] Management Direct Dial [phone]  Fax [phone] [email or web address]

## 2019-03-29 ENCOUNTER — Ambulatory Visit: Payer: Medicare Other | Admitting: Diagnostic Neuroimaging

## 2019-05-25 ENCOUNTER — Other Ambulatory Visit: Payer: Self-pay

## 2019-05-25 NOTE — Patient Outreach (Signed)
[facility]) Care Management  05/25/2019  Troy [name] Nov 16, 1932 [id number]   Medication Adherence call to [name] Hippa Identifiers Verify spoke with patient he is past due on Simvastatin 40 mg and Metformin ER 500 mg patient takes both medication patient ask if we can call [facility] an order both medication pharmacy will mail out with in 7 days. [name] is hsowing past due under [place].   [place] Management Direct Dial [phone]  Fax [phone] [email or web address]

## 2019-05-27 ENCOUNTER — Other Ambulatory Visit: Payer: Self-pay | Admitting: Family Medicine

## 2019-05-28 ENCOUNTER — Encounter: Payer: Self-pay | Admitting: Family Medicine

## 2019-05-28 NOTE — Telephone Encounter (Signed)
E-scribed refills.  Pls schedule wellness and labs.  

## 2019-06-03 NOTE — Telephone Encounter (Signed)
Pt is scheduled 07/26/19 for labs and 08/02/19 for wellness with Dr. Darnell Level

## 2019-07-10 ENCOUNTER — Encounter (INDEPENDENT_AMBULATORY_CARE_PROVIDER_SITE_OTHER): Payer: Self-pay

## 2019-07-25 ENCOUNTER — Other Ambulatory Visit: Payer: Self-pay | Admitting: Family Medicine

## 2019-07-25 DIAGNOSIS — E118 Type 2 diabetes mellitus with unspecified complications: Secondary | ICD-10-CM

## 2019-07-25 DIAGNOSIS — E7800 Pure hypercholesterolemia, unspecified: Secondary | ICD-10-CM

## 2019-07-25 DIAGNOSIS — R202 Paresthesia of skin: Secondary | ICD-10-CM

## 2019-07-25 DIAGNOSIS — D696 Thrombocytopenia, unspecified: Secondary | ICD-10-CM

## 2019-07-25 DIAGNOSIS — R413 Other amnesia: Secondary | ICD-10-CM

## 2019-07-25 DIAGNOSIS — E010 Iodine-deficiency related diffuse (endemic) goiter: Secondary | ICD-10-CM

## 2019-07-26 ENCOUNTER — Other Ambulatory Visit (INDEPENDENT_AMBULATORY_CARE_PROVIDER_SITE_OTHER): Payer: Medicare Other

## 2019-07-26 DIAGNOSIS — E010 Iodine-deficiency related diffuse (endemic) goiter: Secondary | ICD-10-CM | POA: Diagnosis not present

## 2019-07-26 DIAGNOSIS — E118 Type 2 diabetes mellitus with unspecified complications: Secondary | ICD-10-CM

## 2019-07-26 DIAGNOSIS — D696 Thrombocytopenia, unspecified: Secondary | ICD-10-CM | POA: Diagnosis not present

## 2019-07-26 DIAGNOSIS — R202 Paresthesia of skin: Secondary | ICD-10-CM | POA: Diagnosis not present

## 2019-07-26 DIAGNOSIS — R413 Other amnesia: Secondary | ICD-10-CM

## 2019-07-26 DIAGNOSIS — E7800 Pure hypercholesterolemia, unspecified: Secondary | ICD-10-CM

## 2019-07-26 LAB — CBC WITH DIFFERENTIAL/PLATELET
Basophils Absolute: 0 10*3/uL (ref 0.0–0.1)
Basophils Relative: 0.6 % (ref 0.0–3.0)
Eosinophils Absolute: 0.1 10*3/uL (ref 0.0–0.7)
Eosinophils Relative: 1.1 % (ref 0.0–5.0)
HCT: 40.1 % (ref 39.0–52.0)
Hemoglobin: 13.2 g/dL (ref 13.0–17.0)
Lymphocytes Relative: 33.3 % (ref 12.0–46.0)
Lymphs Abs: 1.9 10*3/uL (ref 0.7–4.0)
MCHC: 32.9 g/dL (ref 30.0–36.0)
MCV: 91.1 fl (ref 78.0–100.0)
Monocytes Absolute: 0.4 10*3/uL (ref 0.1–1.0)
Monocytes Relative: 7.2 % (ref 3.0–12.0)
Neutro Abs: 3.3 10*3/uL (ref 1.4–7.7)
Neutrophils Relative %: 57.8 % (ref 43.0–77.0)
Platelets: 148 10*3/uL — ABNORMAL LOW (ref 150.0–400.0)
RBC: 4.4 Mil/uL (ref 4.22–5.81)
RDW: 15.6 % — ABNORMAL HIGH (ref 11.5–15.5)
WBC: 5.7 10*3/uL (ref 4.0–10.5)

## 2019-07-26 LAB — COMPREHENSIVE METABOLIC PANEL
ALT: 15 U/L (ref 0–53)
AST: 15 U/L (ref 0–37)
Albumin: 4.3 g/dL (ref 3.5–5.2)
Alkaline Phosphatase: 138 U/L — ABNORMAL HIGH (ref 39–117)
BUN: 34 mg/dL — ABNORMAL HIGH (ref 6–23)
CO2: 25 mEq/L (ref 19–32)
Calcium: 9.5 mg/dL (ref 8.4–10.5)
Chloride: 106 mEq/L (ref 96–112)
Creatinine, Ser: 1.47 mg/dL (ref 0.40–1.50)
GFR: 45.63 mL/min — ABNORMAL LOW (ref >60.00)
Glucose, Bld: 125 mg/dL — ABNORMAL HIGH (ref 70–99)
Potassium: 4.1 mEq/L (ref 3.5–5.1)
Sodium: 143 mEq/L (ref 135–145)
Total Bilirubin: 0.4 mg/dL (ref 0.2–1.2)
Total Protein: 7.1 g/dL (ref 6.0–8.3)

## 2019-07-26 LAB — VITAMIN B12: Vitamin B-12: 183 pg/mL — ABNORMAL LOW (ref 211–911)

## 2019-07-26 LAB — MICROALBUMIN / CREATININE URINE RATIO
Creatinine,U: 151.7 mg/dL
Microalb Creat Ratio: 5.9 mg/g (ref 0.0–30.0)
Microalb, Ur: 8.9 mg/dL — ABNORMAL HIGH (ref 0.0–1.9)

## 2019-07-26 LAB — LIPID PANEL
Cholesterol: 141 mg/dL (ref 0–200)
HDL: 35.8 mg/dL — ABNORMAL LOW (ref >39.00)
LDL Cholesterol: 82 mg/dL (ref 0–99)
NonHDL: 105.61
Total CHOL/HDL Ratio: 4
Triglycerides: 118 mg/dL (ref 0.0–149.0)
VLDL: 23.6 mg/dL (ref 0.0–40.0)

## 2019-07-26 LAB — HEMOGLOBIN A1C: Hgb A1c MFr Bld: 6.6 % — ABNORMAL HIGH (ref 4.6–6.5)

## 2019-07-26 LAB — TSH: TSH: 3.56 u[IU]/mL (ref 0.35–4.50)

## 2019-07-26 NOTE — Addendum Note (Signed)
Addended by: Cloyd Stagers on: 07/26/2019 10:46 AM   Modules accepted: Orders

## 2019-07-27 ENCOUNTER — Encounter: Payer: Self-pay | Admitting: Family Medicine

## 2019-08-02 ENCOUNTER — Encounter: Payer: Self-pay | Admitting: Family Medicine

## 2019-08-02 ENCOUNTER — Other Ambulatory Visit: Payer: Self-pay

## 2019-08-02 ENCOUNTER — Ambulatory Visit (INDEPENDENT_AMBULATORY_CARE_PROVIDER_SITE_OTHER): Payer: Medicare Other | Admitting: Family Medicine

## 2019-08-02 VITALS — BP 136/64 | HR 75 | Temp 98.4°F | Ht 69.25 in | Wt 159.4 lb

## 2019-08-02 DIAGNOSIS — R4189 Other symptoms and signs involving cognitive functions and awareness: Secondary | ICD-10-CM

## 2019-08-02 DIAGNOSIS — D649 Anemia, unspecified: Secondary | ICD-10-CM

## 2019-08-02 DIAGNOSIS — E538 Deficiency of other specified B group vitamins: Secondary | ICD-10-CM | POA: Diagnosis not present

## 2019-08-02 DIAGNOSIS — Z8673 Personal history of transient ischemic attack (TIA), and cerebral infarction without residual deficits: Secondary | ICD-10-CM

## 2019-08-02 DIAGNOSIS — K5904 Chronic idiopathic constipation: Secondary | ICD-10-CM

## 2019-08-02 DIAGNOSIS — Z23 Encounter for immunization: Secondary | ICD-10-CM | POA: Diagnosis not present

## 2019-08-02 DIAGNOSIS — K2270 Barrett's esophagus without dysplasia: Secondary | ICD-10-CM

## 2019-08-02 DIAGNOSIS — Z720 Tobacco use: Secondary | ICD-10-CM

## 2019-08-02 DIAGNOSIS — Z0000 Encounter for general adult medical examination without abnormal findings: Secondary | ICD-10-CM

## 2019-08-02 DIAGNOSIS — H903 Sensorineural hearing loss, bilateral: Secondary | ICD-10-CM

## 2019-08-02 DIAGNOSIS — I700 Atherosclerosis of aorta: Secondary | ICD-10-CM

## 2019-08-02 DIAGNOSIS — I152 Hypertension secondary to endocrine disorders: Secondary | ICD-10-CM

## 2019-08-02 DIAGNOSIS — J449 Chronic obstructive pulmonary disease, unspecified: Secondary | ICD-10-CM

## 2019-08-02 DIAGNOSIS — I25119 Atherosclerotic heart disease of native coronary artery with unspecified angina pectoris: Secondary | ICD-10-CM

## 2019-08-02 DIAGNOSIS — D696 Thrombocytopenia, unspecified: Secondary | ICD-10-CM

## 2019-08-02 DIAGNOSIS — K279 Peptic ulcer, site unspecified, unspecified as acute or chronic, without hemorrhage or perforation: Secondary | ICD-10-CM

## 2019-08-02 DIAGNOSIS — E118 Type 2 diabetes mellitus with unspecified complications: Secondary | ICD-10-CM

## 2019-08-02 DIAGNOSIS — Z7189 Other specified counseling: Secondary | ICD-10-CM

## 2019-08-02 DIAGNOSIS — E785 Hyperlipidemia, unspecified: Secondary | ICD-10-CM

## 2019-08-02 DIAGNOSIS — E1169 Type 2 diabetes mellitus with other specified complication: Secondary | ICD-10-CM

## 2019-08-02 DIAGNOSIS — R413 Other amnesia: Secondary | ICD-10-CM

## 2019-08-02 DIAGNOSIS — E1159 Type 2 diabetes mellitus with other circulatory complications: Secondary | ICD-10-CM

## 2019-08-02 MED ORDER — FLUTICASONE PROPIONATE 50 MCG/ACT NA SUSP: 2.0000 | Freq: Every day | NASAL | 6 refills | Status: DC

## 2019-08-02 MED ORDER — OMEPRAZOLE 40 MG PO CPDR: 40.0000 mg | DELAYED_RELEASE_CAPSULE | Freq: Every day | ORAL | 3 refills | Status: DC

## 2019-08-02 MED ORDER — SIMVASTATIN 40 MG PO TABS: 40.0000 mg | ORAL_TABLET | Freq: Every day | ORAL | 3 refills | Status: DC

## 2019-08-02 MED ORDER — MEMANTINE HCL 10 MG PO TABS: 10.0000 mg | ORAL_TABLET | Freq: Two times a day (BID) | ORAL | 11 refills | Status: DC

## 2019-08-02 MED ORDER — SERTRALINE HCL 50 MG PO TABS: 50.0000 mg | ORAL_TABLET | Freq: Every day | ORAL | 3 refills | Status: DC

## 2019-08-02 MED ORDER — METFORMIN HCL 500 MG PO TABS: 500.0000 mg | ORAL_TABLET | Freq: Two times a day (BID) | ORAL | 3 refills | Status: DC

## 2019-08-02 MED ORDER — ALBUTEROL SULFATE HFA 108 (90 BASE) MCG/ACT IN AERS: 2.0000 | INHALATION_SPRAY | Freq: Four times a day (QID) | RESPIRATORY_TRACT | 3 refills | Status: DC | PRN

## 2019-08-02 MED ORDER — CYANOCOBALAMIN 1000 MCG/ML IJ SOLN
1000.0000 ug | Freq: Once | INTRAMUSCULAR | Status: AC
  Administered 2019-08-02: 15:00:00 1000 ug via INTRAMUSCULAR

## 2019-08-02 MED ORDER — ISOSORBIDE MONONITRATE ER 30 MG PO TB24: 15.0000 mg | ORAL_TABLET | Freq: Every day | ORAL | 3 refills | Status: DC

## 2019-08-02 MED ORDER — BREO ELLIPTA 100-25 MCG/INH IN AEPB: 1.0000 | INHALATION_SPRAY | Freq: Every day | RESPIRATORY_TRACT | 11 refills | Status: DC

## 2019-08-02 MED ORDER — METOPROLOL TARTRATE 25 MG PO TABS: ORAL_TABLET | ORAL | 3 refills | Status: DC

## 2019-08-02 NOTE — Patient Instructions (Addendum)
Flu shot today I'd like you to see the neurologist to check the memory (see Rosaria Ferries if available).  We will refer you to audiologist (hearing doctor) Increase sertraline to 50mg  daily.  Vitamin b12 was low - b12 shot today, then start 1058mcg dissolvable b12 tablets daily.  Increase water intake to keep kidneys hydrated.  I will refill all you medicines today Return as needed or in 6 months for follow up visit.   Health Maintenance After Age 83 After age 17, you are at a higher risk for certain long-term diseases and infections as well as injuries from falls. Falls are a major cause of broken bones and head injuries in people who are older than age 42. Getting regular preventive care can help to keep you healthy and well. Preventive care includes getting regular testing and making lifestyle changes as recommended by your health care provider. Talk with your health care provider about:  Which screenings and tests you should have. A screening is a test that checks for a disease when you have no symptoms.  A diet and exercise plan that is right for you. What should I know about screenings and tests to prevent falls? Screening and testing are the best ways to find a health problem early. Early diagnosis and treatment give you the best chance of managing medical conditions that are common after age 84. Certain conditions and lifestyle choices may make you more likely to have a fall. Your health care provider may recommend:  Regular vision checks. Poor vision and conditions such as cataracts can make you more likely to have a fall. If you wear glasses, make sure to get your prescription updated if your vision changes.  Medicine review. Work with your health care provider to regularly review all of the medicines you are taking, including over-the-counter medicines. Ask your health care provider about any side effects that may make you more likely to have a fall. Tell your health care provider if any  medicines that you take make you feel dizzy or sleepy.  Osteoporosis screening. Osteoporosis is a condition that causes the bones to get weaker. This can make the bones weak and cause them to break more easily.  Blood pressure screening. Blood pressure changes and medicines to control blood pressure can make you feel dizzy.  Strength and balance checks. Your health care provider may recommend certain tests to check your strength and balance while standing, walking, or changing positions.  Foot health exam. Foot pain and numbness, as well as not wearing proper footwear, can make you more likely to have a fall.  Depression screening. You may be more likely to have a fall if you have a fear of falling, feel emotionally low, or feel unable to do activities that you used to do.  Alcohol use screening. Using too much alcohol can affect your balance and may make you more likely to have a fall. What actions can I take to lower my risk of falls? General instructions  Talk with your health care provider about your risks for falling. Tell your health care provider if: ? You fall. Be sure to tell your health care provider about all falls, even ones that seem minor. ? You feel dizzy, sleepy, or off-balance.  Take over-the-counter and prescription medicines only as told by your health care provider. These include any supplements.  Eat a healthy diet and maintain a healthy weight. A healthy diet includes low-fat dairy products, low-fat (lean) meats, and fiber from whole grains, beans, and  lots of fruits and vegetables. Home safety  Remove any tripping hazards, such as rugs, cords, and clutter.  Install safety equipment such as grab bars in bathrooms and safety rails on stairs.  Keep rooms and walkways well-lit. Activity   Follow a regular exercise program to stay fit. This will help you maintain your balance. Ask your health care provider what types of exercise are appropriate for you.  If you  need a cane or walker, use it as recommended by your health care provider.  Wear supportive shoes that have nonskid soles. Lifestyle  Do not drink alcohol if your health care provider tells you not to drink.  If you drink alcohol, limit how much you have: ? 0-1 drink a day for women. ? 0-2 drinks a day for men.  Be aware of how much alcohol is in your drink. In the [place] one drink equals one typical bottle of beer (12 oz), one-half glass of wine (5 oz), or one shot of hard liquor (1 oz).  Do not use any products that contain nicotine or tobacco, such as cigarettes and e-cigarettes. If you need help quitting, ask your health care provider. Summary  Having a healthy lifestyle and getting preventive care can help to protect your health and wellness after age 78.  Screening and testing are the best way to find a health problem early and help you avoid having a fall. Early diagnosis and treatment give you the best chance for managing medical conditions that are more common for people who are older than age 45.  Falls are a major cause of broken bones and head injuries in people who are older than age 68. Take precautions to prevent a fall at home.  Work with your health care provider to learn what changes you can make to improve your health and wellness and to prevent falls. This information is not intended to replace advice given to you by your health care provider. Make sure you discuss any questions you have with your health care provider. Document Released: 07/16/2017 Document Revised: 12/24/2018 Document Reviewed: 07/16/2017 Elsevier Patient Education  2020 [employer].

## 2019-08-02 NOTE — Progress Notes (Signed)
This visit was conducted in person.  BP 136/64 (BP Location: Left Arm, Patient Position: Sitting, Cuff Size: Normal)   Pulse 75   Temp 98.4 F (36.9 C) (Temporal)   Ht 5' 9.25" (1.759 m)   Wt 159 lb 7 oz (72.3 kg)   SpO2 96%   BMI 23.38 kg/m    CC: AMW Subjective:    Patient ID: Troy [name], male    DOB: [birth year], 85 y.o.   MRN: [id number]  HPI: Troy [name] is a 85 y.o. male presenting on 08/02/2019 for Medicare Wellness (Pt accompanied by daughter, Santiago Glad (temp, 98.3).)   Did not see health advisor this year.  Wife passed away early 2016/12/26 from lung cancer.  See recent mychart message for family concerns.  Son [name] manages medicines.   Geriatric Assessment: Activities of Daily Living:     Bathing- independent     Dressing- independent     Eating- independent     Toileting- independent     Transferring- independent     Continence- independent  Overall Assessment: independent   Instrumental Activities of Daily Living:     Transportation- independent     Meal/Food Preparation- dependent (children)     Shopping Errands- dependent     Housekeeping/Chores- partially dependent     Money Management/Finances- dependent     Medication Management- dependent     Ability to Use Telephone- independent     Laundry- dependent  Overall Assessment: largely dependent     Hearing Screening   125Hz  250Hz  500Hz  1000Hz  2000Hz  3000Hz  4000Hz  6000Hz  8000Hz   Right ear:   0 0 0  0    Left ear:   0 0 0  0      Visual Acuity Screening   Right eye Left eye Both eyes  Without correction: 20/25 20/30 20/30   With correction:     Markedly hard of hearing.    Office Visit from 08/02/2019 in [place] at [facility]  PHQ-2 Total Score  5      Fall Risk  08/02/2019 07/07/2018 10/15/2017 10/01/2016  Falls in the past year? 1 No No No  Number falls in past yr: 0 - - -  Injury with Fall? 0 - - -      Preventative: COLONOSCOPY 10/2014;2 hyperplastic polyp, hemmorhoids  ([place]), age out Prostate cancer screening -aged out Lung cancer screening -not eligible due to age  Flu shot -yearly Td 12/26/2009 Pneumovax 12-27-11, prevnar declines today  Shingles shot -declines Advanced planning: scanned and reviewed 09/2018. Son [name] is HCPOA. Does not want prolonged life support if terminal condition. From previous discussion: does not want CPR, intubation or life support. Thinks he would be ok with temporary BiPAP if needed.  Seat belt use discussed  Sunscreen usediscussed. No changing moles on skin.  Smoking 1/2-1 ppd. Minimal dipping.  Alcohol - none  Dentist yearly  Eye exam has not seen recently.  Bowel - no constipation Bladder - no incontinence  Lives alone, widower (wife passed away December 26, 2016); No pets He is very active He was in the [employer] for 4 years. He gets meds from the [place] Activity: walking daily since back surgery  Diet: good water, not much fruits/vegetables     Relevant past medical, surgical, family and social history reviewed and updated as indicated. Interim medical history since our last visit reviewed. Allergies and medications reviewed and updated. Outpatient Medications Prior to Visit  Medication Sig Dispense Refill  . aspirin 81 MG tablet Take 81  mg by mouth daily.     . magnesium hydroxide (MILK OF MAGNESIA) 400 MG/5ML suspension Take 15 mLs by mouth daily as needed for moderate constipation. 360 mL 0  . Multiple Vitamins-Minerals (MULTIVITAMIN ADULT) TABS Take 1 tablet by mouth daily.    [name] nystatin (MYCOSTATIN/NYSTOP) powder Apply topically 3 (three) times daily. 45 g 0  . albuterol (PROVENTIL HFA;VENTOLIN HFA) 108 (90 Base) MCG/ACT inhaler Inhale 2 puffs into the lungs every 6 (six) hours as needed for wheezing or shortness of breath. 1 Inhaler 6  . fluticasone (FLONASE) 50 MCG/ACT nasal spray Place 2 sprays into both nostrils daily. 16 g 1  . fluticasone furoate-vilanterol (BREO ELLIPTA) 100-25 MCG/INH AEPB Inhale 1 puff into the lungs daily.  1 each 6  . isosorbide mononitrate (IMDUR) 30 MG 24 hr tablet TAKE ONE-HALF TABLET BY  MOUTH DAILY 45 tablet 3  . memantine (NAMENDA) 10 MG tablet Take 1 tablet (10 mg total) by mouth 2 (two) times daily. 60 tablet 6  . metFORMIN (GLUCOPHAGE) 500 MG tablet TAKE 1 TABLET BY MOUTH TWO  TIMES DAILY WITH MEALS 180 tablet 3  . metoprolol tartrate (LOPRESSOR) 25 MG tablet TAKE ONE-HALF TABLET BY  MOUTH TWICE A DAY 90 tablet 0  . omeprazole (PRILOSEC) 40 MG capsule TAKE 1 CAPSULE BY MOUTH  DAILY 90 capsule 0  . sertraline (ZOLOFT) 25 MG tablet Take 1 tablet (25 mg total) by mouth daily. 90 tablet 3  . simvastatin (ZOCOR) 40 MG tablet TAKE 1 TABLET BY MOUTH AT  BEDTIME 90 tablet 3  . predniSONE (DELTASONE) 10 MG tablet Take three tablets for 3 days followed by two tablets for 3 days followed by one tablet for 3 days 18 tablet 0   No facility-administered medications prior to visit.      Per HPI unless specifically indicated in ROS section below Review of Systems  Constitutional: Negative for activity change, appetite change, chills, fatigue, fever and unexpected weight change.  HENT: Negative for hearing loss.   Eyes: Negative for visual disturbance.  Respiratory: Negative for cough, chest tightness, shortness of breath and wheezing.   Cardiovascular: Negative for chest pain, palpitations and leg swelling.  Gastrointestinal: Negative for abdominal distention, abdominal pain, blood in stool, constipation, diarrhea, nausea and vomiting.  Genitourinary: Negative for difficulty urinating and hematuria.  Musculoskeletal: Negative for arthralgias, myalgias and neck pain.  Skin: Negative for rash.  Neurological: Negative for dizziness, seizures, syncope and headaches.  Hematological: Negative for adenopathy. Does not bruise/bleed easily.  Psychiatric/Behavioral: Negative for dysphoric mood. The patient is not nervous/anxious.    Objective:    BP 136/64 (BP Location: Left Arm, Patient Position:  Sitting, Cuff Size: Normal)   Pulse 75   Temp 98.4 F (36.9 C) (Temporal)   Ht 5' 9.25" (1.759 m)   Wt 159 lb 7 oz (72.3 kg)   SpO2 96%   BMI 23.38 kg/m   Wt Readings from Last 3 Encounters:  08/02/19 159 lb 7 oz (72.3 kg)  11/09/18 160 lb 3 oz (72.7 kg)  10/01/18 162 lb 12 oz (73.8 kg)    Physical Exam Vitals signs and nursing note reviewed.  Constitutional:      General: He is not in acute distress.    Appearance: Normal appearance. He is well-developed. He is not ill-appearing.  HENT:     Head: Normocephalic and atraumatic.     Right Ear: Tympanic membrane, ear canal and external ear normal. Decreased hearing noted.     Left Ear:  Tympanic membrane, ear canal and external ear normal. Decreased hearing noted.     Nose: Nose normal.     Mouth/Throat:     Mouth: Mucous membranes are moist.     Pharynx: Oropharynx is clear. Uvula midline. No posterior oropharyngeal erythema.  Eyes:     General: No scleral icterus.    Conjunctiva/sclera: Conjunctivae normal.     Pupils: Pupils are equal, round, and reactive to light.  Neck:     Musculoskeletal: Normal range of motion and neck supple.     Vascular: No carotid bruit.  Cardiovascular:     Rate and Rhythm: Normal rate and regular rhythm.     Pulses: Normal pulses.          Radial pulses are 2+ on the right side and 2+ on the left side.     Heart sounds: Normal heart sounds. No murmur.  Pulmonary:     Effort: Pulmonary effort is normal. No respiratory distress.     Breath sounds: No wheezing, rhonchi or rales.  Abdominal:     General: Abdomen is flat. Bowel sounds are normal. There is no distension.     Palpations: Abdomen is soft. There is no mass.     Tenderness: There is no abdominal tenderness. There is no guarding or rebound.  Musculoskeletal: Normal range of motion.     Right lower leg: No edema.     Left lower leg: No edema.  Lymphadenopathy:     Cervical: No cervical adenopathy.  Skin:    General: Skin is warm and  dry.     Findings: No rash.  Neurological:     General: No focal deficit present.     Mental Status: He is alert and oriented to person, place, and time.     Comments: CN grossly intact, station and gait intact  Psychiatric:        Mood and Affect: Mood normal.        Behavior: Behavior normal.        Thought Content: Thought content normal.        Judgment: Judgment normal.       Results for orders placed or performed in visit on 07/26/19  CBC with Differential  Result Value Ref Range   WBC 5.7 4.0 - 10.5 K/uL   RBC 4.40 4.22 - 5.81 Mil/uL   Hemoglobin 13.2 13.0 - 17.0 g/dL   HCT 40.1 39.0 - 52.0 %   MCV 91.1 78.0 - 100.0 fl   MCHC 32.9 30.0 - 36.0 g/dL   RDW 15.6 (H) 11.5 - 15.5 %   Platelets 148.0 (L) 150.0 - 400.0 K/uL   Neutrophils Relative % 57.8 43.0 - 77.0 %   Lymphocytes Relative 33.3 12.0 - 46.0 %   Monocytes Relative 7.2 3.0 - 12.0 %   Eosinophils Relative 1.1 0.0 - 5.0 %   Basophils Relative 0.6 0.0 - 3.0 %   Neutro Abs 3.3 1.4 - 7.7 K/uL   Lymphs Abs 1.9 0.7 - 4.0 K/uL   Monocytes Absolute 0.4 0.1 - 1.0 K/uL   Eosinophils Absolute 0.1 0.0 - 0.7 K/uL   Basophils Absolute 0.0 0.0 - 0.1 K/uL  Hemoglobin A1c  Result Value Ref Range   Hgb A1c MFr Bld 6.6 (H) 4.6 - 6.5 %  TSH  Result Value Ref Range   TSH 3.56 0.35 - 4.50 uIU/mL  Lipid panel  Result Value Ref Range   Cholesterol 141 0 - 200 mg/dL   Triglycerides 118.0 0.0 -  149.0 mg/dL   HDL 35.80 (L) >39.00 mg/dL   VLDL 23.6 0.0 - 40.0 mg/dL   LDL Cholesterol 82 0 - 99 mg/dL   Total CHOL/HDL Ratio 4    NonHDL 105.61   Comprehensive metabolic panel  Result Value Ref Range   Sodium 143 135 - 145 mEq/L   Potassium 4.1 3.5 - 5.1 mEq/L   Chloride 106 96 - 112 mEq/L   CO2 25 19 - 32 mEq/L   Glucose, Bld 125 (H) 70 - 99 mg/dL   BUN 34 (H) 6 - 23 mg/dL   Creatinine, Ser 1.47 0.40 - 1.50 mg/dL   Total Bilirubin 0.4 0.2 - 1.2 mg/dL   Alkaline Phosphatase 138 (H) 39 - 117 U/L   AST 15 0 - 37 U/L   ALT 15 0 -  53 U/L   Total Protein 7.1 6.0 - 8.3 g/dL   Albumin 4.3 3.5 - 5.2 g/dL   GFR 45.63 (L) >60.00 mL/min   Calcium 9.5 8.4 - 10.5 mg/dL  Vitamin B12  Result Value Ref Range   Vitamin B-12 183 (L) 211 - 911 pg/mL  Microalbumin / creatinine urine ratio  Result Value Ref Range   Microalb, Ur 8.9 (H) 0.0 - 1.9 mg/dL   Creatinine,U 151.7 mg/dL   Microalb Creat Ratio 5.9 0.0 - 30.0 mg/g   Depression screen [facility] 2/9 08/02/2019 11/09/2018 07/07/2018 10/15/2017 10/01/2016  Decreased Interest 3 3 0 0 0  Down, Depressed, Hopeless 2 2 0 0 0  PHQ - 2 Score 5 5 0 0 0  Altered sleeping 0 2 - - -  Tired, decreased energy 2 3 - - -  Change in appetite 0 3 - - -  Feeling bad or failure about yourself  0 2 - - -  Trouble concentrating 0 0 - - -  Moving slowly or fidgety/restless 1 3 - - -  Suicidal thoughts 0 1 - - -  PHQ-9 Score 8 19 - - -    Assessment & Plan:   Problem List Items Addressed This Visit    Vitamin B12 deficiency    b12 shot today, then start 101mcg B12 daily. Metformin use contributes.       Tobacco use    Continued smoker, not interested in quitting.       Thrombocytopenia (HCC)    Mild, on aspirin. Continue to monitor.       Sensorineural hearing loss (SNHL) of both ears    Known h/o sensorineural hearing loss. Pt declines trouble with this. Discussed concerns with him continuing to drive. Will refer back to audiology. Previously declined hearing aides.      Relevant Orders   Ambulatory referral to Audiology   PUD (peptic ulcer disease)    Continue PPI daily. Avoid aricept and NSAIDs.       Pseudodementia    Pt minimizes concerns. Increase sertraline to 50mg  daily.       Memory deficit    Ongoing short term memory concern, in h/o strokes anticipate component of vascular dementia sa well as pseudodementia playing a role in cognitive impairment. Increase sertraline to 50mg  daily. Discussed concerns with him continuing to drive on his own, rec against this. Will refer  to neurology. Avoid aricept in PUD hx.       Relevant Orders   Ambulatory referral to Neurology   Medicare annual wellness visit, subsequent - Primary    I have personally reviewed the Medicare Annual Wellness questionnaire and have noted 1. The patient's  medical and social history 2. Their use of alcohol, tobacco or illicit drugs 3. Their current medications and supplements 4. The patient's functional ability including ADL's, fall risks, home safety risks and hearing or visual impairment. Cognitive function has been assessed and addressed as indicated.  5. Diet and physical activity 6. Evidence for depression or mood disorders The patients weight, height, BMI have been recorded in the chart. I have made referrals, counseling and provided education to the patient based on review of the above and I have provided the pt with a written personalized care plan for preventive services. Provider list updated.. See scanned questionairre as needed for further documentation. Reviewed preventative protocols and updated unless pt declined.       Hypertension associated with diabetes (HCC)    Chronic, stable. Continue current regimen.      Relevant Medications   isosorbide mononitrate (IMDUR) 30 MG 24 hr tablet   metFORMIN (GLUCOPHAGE) 500 MG tablet   metoprolol tartrate (LOPRESSOR) 25 MG tablet   simvastatin (ZOCOR) 40 MG tablet   Hyperlipidemia associated with type 2 diabetes mellitus (HCC)    Chronic, stable. Continue simvastatin. The ASCVD Risk score Mikey Bussing DC Jr., et al., 2013) failed to calculate for the following reasons:   The 2013 ASCVD risk score is only valid for ages 53 to 36       Relevant Medications   metFORMIN (GLUCOPHAGE) 500 MG tablet   simvastatin (ZOCOR) 40 MG tablet   History of stroke   Health maintenance examination    Preventative protocols reviewed and updated unless pt declined. Discussed healthy diet and lifestyle.       Diabetes mellitus type 2 with  complications (HCC)    Chronic, stable. Continue metformin. Monitor kidney function Q54mo on metformin.       Relevant Medications   metFORMIN (GLUCOPHAGE) 500 MG tablet   simvastatin (ZOCOR) 40 MG tablet   COPD (chronic obstructive pulmonary disease) (HCC)    Chronic. Not regularly using breo. Continued smoker. Encouraged daily breo - refilled today.       Relevant Medications   albuterol (VENTOLIN HFA) 108 (90 Base) MCG/ACT inhaler   fluticasone (FLONASE) 50 MCG/ACT nasal spray   fluticasone furoate-vilanterol (BREO ELLIPTA) 100-25 MCG/INH AEPB   Chronic idiopathic constipation    States stable period on current regimen (MOM)      CAD (coronary artery disease)    Continue aspirin, statin, beta blocker and imdur.       Relevant Medications   isosorbide mononitrate (IMDUR) 30 MG 24 hr tablet   metoprolol tartrate (LOPRESSOR) 25 MG tablet   simvastatin (ZOCOR) 40 MG tablet   Barrett's esophagus    Continue PPI indefinitely (omeprazole 40mg  daily)      Aorto-iliac atherosclerosis (HCC)    Continue aspirin, statin      Relevant Medications   isosorbide mononitrate (IMDUR) 30 MG 24 hr tablet   metoprolol tartrate (LOPRESSOR) 25 MG tablet   simvastatin (ZOCOR) 40 MG tablet   Anemia    This has resolved.       Advanced care planning/counseling discussion    Advanced planning: scanned and reviewed 09/2018. Son [name] is HCPOA. Does not want prolonged life support if terminal condition. From previous discussion: does not want CPR, intubation or life support. Thinks he would be ok with temporary BiPAP if needed.        Other Visit Diagnoses    Need for influenza vaccination       Relevant Orders   Flu Vaccine  QUAD High Dose(Fluad) (Completed)       Meds ordered this encounter  Medications  . cyanocobalamin ((VITAMIN B-12)) injection 1,000 mcg  . albuterol (VENTOLIN HFA) 108 (90 Base) MCG/ACT inhaler    Sig: Inhale 2 puffs into the lungs every 6 (six) hours as needed for  wheezing or shortness of breath.    Dispense:  1 g    Refill:  3  . fluticasone (FLONASE) 50 MCG/ACT nasal spray    Sig: Place 2 sprays into both nostrils daily.    Dispense:  16 g    Refill:  6  . fluticasone furoate-vilanterol (BREO ELLIPTA) 100-25 MCG/INH AEPB    Sig: Inhale 1 puff into the lungs daily.    Dispense:  1 each    Refill:  11  . isosorbide mononitrate (IMDUR) 30 MG 24 hr tablet    Sig: Take 0.5 tablets (15 mg total) by mouth daily.    Dispense:  45 tablet    Refill:  3  . memantine (NAMENDA) 10 MG tablet    Sig: Take 1 tablet (10 mg total) by mouth 2 (two) times daily.    Dispense:  60 tablet    Refill:  11  . metFORMIN (GLUCOPHAGE) 500 MG tablet    Sig: Take 1 tablet (500 mg total) by mouth 2 (two) times daily with a meal.    Dispense:  180 tablet    Refill:  3  . metoprolol tartrate (LOPRESSOR) 25 MG tablet    Sig: TAKE ONE-HALF TABLET BY  MOUTH TWICE A DAY    Dispense:  90 tablet    Refill:  3  . omeprazole (PRILOSEC) 40 MG capsule    Sig: Take 1 capsule (40 mg total) by mouth daily.    Dispense:  90 capsule    Refill:  3  . simvastatin (ZOCOR) 40 MG tablet    Sig: Take 1 tablet (40 mg total) by mouth at bedtime.    Dispense:  90 tablet    Refill:  3  . sertraline (ZOLOFT) 50 MG tablet    Sig: Take 1 tablet (50 mg total) by mouth daily.    Dispense:  90 tablet    Refill:  3    Note new dose   Orders Placed This Encounter  Procedures  . Flu Vaccine QUAD High Dose(Fluad)  . Ambulatory referral to Neurology    Referral Priority:   Routine    Referral Type:   Consultation    Referral Reason:   Specialty Services Required    Requested Specialty:   Neurology    Number of Visits Requested:   1  . Ambulatory referral to Audiology    Referral Priority:   Routine    Referral Type:   Audiology Exam    Referral Reason:   Specialty Services Required    Number of Visits Requested:   1    Follow up plan: Return in about 6 months (around 01/30/2020) for  follow up visit.  [name], MD

## 2019-08-03 DIAGNOSIS — E538 Deficiency of other specified B group vitamins: Secondary | ICD-10-CM | POA: Insufficient documentation

## 2019-08-03 DIAGNOSIS — H9193 Unspecified hearing loss, bilateral: Secondary | ICD-10-CM | POA: Insufficient documentation

## 2019-08-03 DIAGNOSIS — Z8673 Personal history of transient ischemic attack (TIA), and cerebral infarction without residual deficits: Secondary | ICD-10-CM | POA: Insufficient documentation

## 2019-08-03 NOTE — Assessment & Plan Note (Signed)
Advanced planning: scanned and reviewed 09/2018. Son [name] is HCPOA. Does not want prolonged life support if terminal condition. From previous discussion: does not want CPR, intubation or life support. Thinks he would be ok with temporary BiPAP if needed.

## 2019-08-03 NOTE — Assessment & Plan Note (Addendum)
Continued smoker, not interested in quitting.

## 2019-08-03 NOTE — Assessment & Plan Note (Signed)
Continue aspirin, statin, beta blocker and imdur.

## 2019-08-03 NOTE — Assessment & Plan Note (Addendum)
Chronic. Not regularly using breo. Continued smoker. Encouraged daily breo - refilled today.

## 2019-08-03 NOTE — Assessment & Plan Note (Signed)
Continue PPI indefinitely (omeprazole 40mg  daily)

## 2019-08-03 NOTE — Assessment & Plan Note (Signed)
Continue aspirin, statin.  

## 2019-08-03 NOTE — Assessment & Plan Note (Deleted)
Pt declines trouble with this. Discussed concerns with him continuing to drive. Will refer to audiology.

## 2019-08-03 NOTE — Assessment & Plan Note (Signed)
Chronic, stable. Continue current regimen. 

## 2019-08-03 NOTE — Assessment & Plan Note (Signed)
Continue PPI daily. Avoid aricept and NSAIDs.

## 2019-08-03 NOTE — Assessment & Plan Note (Signed)
States stable period on current regimen (MOM)

## 2019-08-03 NOTE — Assessment & Plan Note (Addendum)
Ongoing short term memory concern, in h/o strokes anticipate component of vascular dementia sa well as pseudodementia playing a role in cognitive impairment. Increase sertraline to 50mg  daily. Discussed concerns with him continuing to drive on his own, rec against this. Will refer to neurology. Avoid aricept in PUD hx.

## 2019-08-03 NOTE — Assessment & Plan Note (Signed)
Mild, on aspirin. Continue to monitor.

## 2019-08-03 NOTE — Assessment & Plan Note (Signed)
Chronic, stable. Continue simvastatin. The ASCVD Risk score Mikey Bussing DC Jr., et al., 2013) failed to calculate for the following reasons:   The 2013 ASCVD risk score is only valid for ages 53 to 89

## 2019-08-03 NOTE — Assessment & Plan Note (Signed)
Pt minimizes concerns. Increase sertraline to 50mg  daily.

## 2019-08-03 NOTE — Assessment & Plan Note (Signed)
Known h/o sensorineural hearing loss. Pt declines trouble with this. Discussed concerns with him continuing to drive. Will refer back to audiology. Previously declined hearing aides.

## 2019-08-03 NOTE — Assessment & Plan Note (Signed)
Preventative protocols reviewed and updated unless pt declined. Discussed healthy diet and lifestyle.  

## 2019-08-03 NOTE — Assessment & Plan Note (Signed)
This has resolved.

## 2019-08-03 NOTE — Assessment & Plan Note (Signed)
b12 shot today, then start 1039mcg B12 daily. Metformin use contributes.

## 2019-08-03 NOTE — Assessment & Plan Note (Signed)
Chronic, stable. Continue metformin. Monitor kidney function Q28mo on metformin.

## 2019-08-03 NOTE — Assessment & Plan Note (Signed)

## 2019-08-06 ENCOUNTER — Telehealth: Payer: Self-pay

## 2019-08-06 MED ORDER — ALBUTEROL SULFATE HFA 108 (90 BASE) MCG/ACT IN AERS: 2.0000 | INHALATION_SPRAY | Freq: Four times a day (QID) | RESPIRATORY_TRACT | 3 refills | Status: DC | PRN

## 2019-08-06 NOTE — Telephone Encounter (Signed)
Received faxed Rx Clarification form from [facility] for albuterol inhaler.  Needs correct quantity to dispense.    E-scribed new rx with #18 g/3.

## 2019-09-06 ENCOUNTER — Other Ambulatory Visit: Payer: Self-pay

## 2019-09-06 ENCOUNTER — Ambulatory Visit: Payer: Medicare Other | Attending: Audiology | Admitting: Audiology

## 2019-09-06 ENCOUNTER — Telehealth: Payer: Self-pay | Admitting: Family Medicine

## 2019-09-06 DIAGNOSIS — H906 Mixed conductive and sensorineural hearing loss, bilateral: Secondary | ICD-10-CM | POA: Insufficient documentation

## 2019-09-06 DIAGNOSIS — H9193 Unspecified hearing loss, bilateral: Secondary | ICD-10-CM

## 2019-09-06 NOTE — Telephone Encounter (Signed)
The patient's son Troy [name] called and would like to know if the patient can get a referral to [facility].

## 2019-09-06 NOTE — Procedures (Addendum)
Outpatient Audiology and [place]  [place], [place] [address]  [phone]   Audiological Evaluation  Patient Name: Troy [name]  Status: Outpatient   DOB: 1933/02/02    Diagnosis: Sensorineural hearing loss  MRN: [id number] Date:  09/06/2019     Referent: [name], MD  History: [name] was seen for an audiological evaluation. Accompanied by: Asa Saunas son   Primary Concern: Hearing loss. "TV turned up very loud", "unable to head unless very close to speaking loudly". Pain: None History of hearing problems: Y  History of ear infections:  Y  History of sinus infections: Y History of occupational noise exposure: Y - "in [employer] and "worked for [employer] for 20 years".    Evaluation: Conventional pure tone audiometry from 250Hz  - 8000Hz  with using insert earphones.  Hearing Thresholds show symmetrical results with air conduction hearing thresholds of 55-65 dBHL from 250Hz  - 3000Hz ; 75-80 dBHL from 4000Hz  - 6000Hz  and no response at 8000Hz  bilaterally.  Masked bone conduction hearing thresholds are 15-25/30 dBHL from 250Hz  - 1000Hz ; 40 dBHL at 1500Hz ; 50-55 dBHL from 2000Hz  - 3000Hz ; 60-65 dBHL at 4000Hz  bilaterally.. Reliability is good Speech reception levels (repeating words near threshold) using recorded spondee word lists:  Right ear: 50 dBHL.  Left ear:  55 dBHL Word recognition (at comfortably loud volumes) using recorded Nu-6 word lists at 95 dBHL, in quiet. Note contralateral masking of 80 dBHL using speech noise was used. Right ear: 96%.  Left ear:   96% Tympanometry was unable to be completed because a seal could not be maintained. Uncomfortable Loudness Levels are 110dBHL in each ear using monitored live voice.  Otoscopic inspections showed no redness, but the ear canal skin is dry and flaky - with report of "itching".  CONCLUSION:      [name] has a moderately severe to  severe mixed symmetrical hearing loss bilaterally. The low frequency hearing loss is primarily conductive with the high frequencies sensorineural bilaterally.. Further evaluation by an ENT is recommended prior to fitting hearing aids.  [name] has excellent word recognition at extremely loud levels, equivalent to shouting at less than one foot away with recruitment; however this amount of hearing loss adversely affects all but the most loud speech communication, with no word recognition at normal conversational speech levels.   Following evaluation by an [place] [facility] physician, a hearing aid evaluation is recommended. The test results were discussed and [name] counseled.  RECOMMENDATIONS: 1.   Further evaluation of the mixed hearing loss and reports of "sinus infections" by an Ear, Nose and Throat (ENT) physician.  2.   Following evaluation by the ENT, a hearing aid evaluation is recommended.  3. Strategies that help improve hearing include: A) Face the speaker directly. Optimal is having the speakers face well - lit.  Unless amplified, being within 3-6 feet of the speaker will enhance word recognition. B) Avoid having the speaker back-lit as this will minimize the ability to use cues from lip-reading, facial expression and gestures. C)  Word recognition is poorer in background noise. For optimal word recognition, turn off the TV, radio or noisy fan when engaging in conversation. In a restaurant, try to sit away from noise sources and close to the primary speaker.        D)  Ask for topic clarification from time to time in order to remain in the conversation.  Most people don't mind repeating or  clarifying a point when asked.  If needed, explain the difficulty hearing in background  noise or hearing loss.  [name] Au.D., CCC-A Doctor of Audiology 09/06/2019   cc: [name], MD

## 2019-09-06 NOTE — Telephone Encounter (Signed)
Spoke with pt's son, [name] (on dpr), asking for reason for referral request.  States pt's confusion and mind is worsening.  Pt went on 400 mi road trip this weekend ending up in [place], [place] not remembering how he got there or how to get home.  Don and a friend had to go pick pt up.  [name] asks that he be called at [phone] to schedule neuro referral.

## 2019-09-06 NOTE — Telephone Encounter (Signed)
appt has already been scheduled for next month.  Rosaria Ferries can you touch base with son?

## 2019-09-07 NOTE — Telephone Encounter (Signed)
Spoke to patients son [name] and talked to him regarding the Neurology Referral and told him about the first appointment that was made back in July that was canceled.Gave him the New Neurology appointment information. Patients son will be bringing him to the appointment.

## 2019-09-30 ENCOUNTER — Telehealth: Payer: Self-pay | Admitting: Family Medicine

## 2019-09-30 DIAGNOSIS — H9193 Unspecified hearing loss, bilateral: Secondary | ICD-10-CM

## 2019-09-30 NOTE — Telephone Encounter (Signed)
Patient's son called today  He is requesting a referral to an ENT doctor, Dr Lucia Gaskins, [facility] location   Patient is needing to see a specialist to have his ear checks. He is having trouble with hearing   Please advise

## 2019-09-30 NOTE — Telephone Encounter (Signed)
Referral placed.

## 2019-10-04 ENCOUNTER — Ambulatory Visit: Payer: Medicare Other | Admitting: Neurology

## 2019-10-12 ENCOUNTER — Ambulatory Visit: Payer: Medicare Other | Admitting: Neurology

## 2019-10-12 ENCOUNTER — Encounter: Payer: Self-pay | Admitting: Neurology

## 2019-10-12 ENCOUNTER — Other Ambulatory Visit: Payer: Self-pay

## 2019-10-12 VITALS — BP 158/93 | HR 83 | Temp 97.0°F | Ht 69.25 in | Wt 160.0 lb

## 2019-10-12 DIAGNOSIS — F0390 Unspecified dementia without behavioral disturbance: Secondary | ICD-10-CM | POA: Diagnosis not present

## 2019-10-12 DIAGNOSIS — E538 Deficiency of other specified B group vitamins: Secondary | ICD-10-CM | POA: Diagnosis not present

## 2019-10-12 NOTE — Progress Notes (Signed)
PATIENT: Troy [name] DOB: August 15, 1933  Chief Complaint  Patient presents with  . Memory Loss    MMSE 20/30 - 7 animals. He is here with his son, Troy [name], to have his short-term memory loss evaluated.  [name] PCP    [name], MD     [place] is a 86 year old male, seen in request by his primary care physician Dr. [name] for evaluation of memory loss, initial evaluation was on October 12, 2019.  I have reviewed and summarized the referring note from the referring physician.  He is a retired Engineer, agricultural, also runned the barbershop in the past, had past medical history of hypertension, hyperlipidemia, diabetes, coronary artery disease, status post CABG, long-term smoker, still smoking pack a day, he lives alone since his wife passed away, his son and daughter checked on him almost daily basis  His mother suffered dementia when she was old, he was noted to have gradual onset memory loss over the past few years, especially since 2019.  In November 2019, he drove off got lost, it happened again in December 2020, he drove to [facility] unexpectedly, his son had to check him down with a cell phone app, ask his relative to pick him up, he could not recall the event, and could not explain why he ended up driving [phone] miles in 1 day.  In 2019, he stopped all his medication abruptly, for guarding refill his medication, and up hospital admission.  He is very sedentary, spends most of the day smoking, sitting in front of the computer, does drive to [employer] frequently in the morning time to get breakfast, his son usually eat dinner with him.  He denies gait difficulty, sleeping well, has good appetite, usually in good mood. I personally reviewed MRI of the brain in August 2019: Mild atrophy, advanced diffuse confluent supratentorial white matter disease  Laboratory evaluation November 2020: CBC showed normal hemoglobin of 13.2, mild elevated RDW 15.6, A1c was elevated  6.6, normal TSH 3.56, lipid panel, LDL of 82, cholesterol of 141, CMP, mild elevated alkaline phosphate 138, BUN of 34, creatinine of 1.47, B12 was decreased 183, he is on p.o. B12 supplement 1000 mcg daily, urine microalbumin was elevated 8.9  He has been taking Namenda 10 mg twice a day, REVIEW OF SYSTEMS: Full 14 system review of systems performed and notable only for as above All other review of systems were negative.  ALLERGIES: Allergies  Allergen Reactions  . Codeine     REACTION: vomiting  . Cyclobenzaprine Hcl     REACTION: vomiting  . Hydrocodone Nausea Only and Other (See Comments)    "crazy"  . Tramadol Other (See Comments)    woozy  . Valium Other (See Comments)    sedation    HOME MEDICATIONS: Current Outpatient Medications  Medication Sig Dispense Refill  . albuterol (VENTOLIN HFA) 108 (90 Base) MCG/ACT inhaler Inhale 2 puffs into the lungs every 6 (six) hours as needed for wheezing or shortness of breath. 18 g 3  . aspirin 81 MG tablet Take 81 mg by mouth daily.     . fluticasone (FLONASE) 50 MCG/ACT nasal spray Place 2 sprays into both nostrils daily. 16 g 6  . fluticasone furoate-vilanterol (BREO ELLIPTA) 100-25 MCG/INH AEPB Inhale 1 puff into the lungs daily. 1 each 11  . isosorbide mononitrate (IMDUR) 30 MG 24 hr tablet Take 0.5 tablets (15 mg total) by mouth daily. 45 tablet 3  . magnesium hydroxide (MILK  OF MAGNESIA) 400 MG/5ML suspension Take 15 mLs by mouth daily as needed for moderate constipation. 360 mL 0  . memantine (NAMENDA) 10 MG tablet Take 1 tablet (10 mg total) by mouth 2 (two) times daily. 60 tablet 11  . metFORMIN (GLUCOPHAGE) 500 MG tablet Take 1 tablet (500 mg total) by mouth 2 (two) times daily with a meal. 180 tablet 3  . metoprolol tartrate (LOPRESSOR) 25 MG tablet TAKE ONE-HALF TABLET BY  MOUTH TWICE A DAY 90 tablet 3  . Multiple Vitamins-Minerals (MULTIVITAMIN ADULT) TABS Take 1 tablet by mouth daily.    [name] nystatin (MYCOSTATIN/NYSTOP)  powder Apply topically 3 (three) times daily. 45 g 0  . omeprazole (PRILOSEC) 40 MG capsule Take 1 capsule (40 mg total) by mouth daily. 90 capsule 3  . sertraline (ZOLOFT) 50 MG tablet Take 1 tablet (50 mg total) by mouth daily. 90 tablet 3  . simvastatin (ZOCOR) 40 MG tablet Take 1 tablet (40 mg total) by mouth at bedtime. 90 tablet 3  . vitamin B-12 (CYANOCOBALAMIN) 1000 MCG tablet Take 1,000 mcg by mouth daily.     No current facility-administered medications for this visit.    PAST MEDICAL HISTORY: Past Medical History:  Diagnosis Date  . Aorto-iliac atherosclerosis ([place]) 08/2015   by xray  . Barrett's esophagus    EGD 2012, rpt 2014, longterm PPI  . Chronic idiopathic constipation 07/17/2010  . Chronic obstructive pulmonary disease (COPD) (H[facility])    spirometry with moderate COPD  . Coronary atherosclerosis of artery bypass graft   . Diabetes mellitus type II ~2008  . Gastrointestinal ulcer 04/2011   with hematemesis s/p EGD by [facility]  . GERD (gastroesophageal reflux disease)   . Hearing loss   . HLD (hyperlipidemia)   . HNP (herniated nucleus pulposus), lumbar 2013   with compression of L5 nerve root and foot drop, also with lumbar DDD s/p surgery  . Hypertrophy of prostate without urinary obstruction and other lower urinary tract symptoms (LUTS)   . Memory loss   . Paroxysmal SVT (supraventricular tachycardia) ([place])    a. 3/14 => converted in ED with adenosine to AFib => NSR;  b. Echo 4/14:  Mild LVH, mild FBSH, EF 55-60%, Gr 1 DD  . Prostate nodule ~2009   Left-benign s/p eval Uro WNL ([facility])  . Thrombocytopenia ([place])   . Tobacco use disorder     PAST SURGICAL HISTORY: Past Surgical History:  Procedure Laterality Date  . COLONOSCOPY  03/2009   small hemorrhoids, 2 polyps (hyperplastic and adenomatous), rpt 5 yrs (Dr. Barron Schmid) - pt declined rpt scheduling  . COLONOSCOPY  10/2014   2 hyperplastic polyp, hemmorhoids ([facility])  . CORONARY ARTERY BYPASS GRAFT  1999   Dr.  Roxy Manns  . ESOPHAGOGASTRODUODENOSCOPY  05/16/2011   after hematemesis, no lesions found, consistent with barrett's esophagus rpt 2 yrs  . ESOPHAGOGASTRODUODENOSCOPY  06/2013   barrett's, small HH, recheck 2-3 yrs ([facility])  . HEMORRHOID SURGERY    . LUMBAR LAMINECTOMY/DECOMPRESSION MICRODISCECTOMY  01/31/2012   Procedure: LUMBAR LAMINECTOMY/DECOMPRESSION MICRODISCECTOMY 1 LEVEL;  Surgeon: Winfield Cunas, MD;  Location: [place] [facility];  Service: Neurosurgery;  Laterality: Left;  LEFT Lumbar Four-Five Diskectomy  . NOSE SURGERY    . ROTATOR CUFF REPAIR  04/2010   right (but has bilat) Dr. Latanya Maudlin ([facility])    FAMILY HISTORY: Family History  Problem Relation Age of Onset  . Cancer Father 16       colon  . Memory loss Mother  SOCIAL HISTORY: Social History   Socioeconomic History  . Marital status: Widowed    Spouse name: Not on file  . Number of children: 2  . Years of education: 23  . Highest education level: High school graduate  Occupational History  . Occupation: Retired from [employer]  Tobacco Use  . Smoking status: Current Every Day Smoker    Packs/day: 1.00    Years: 60.00    Pack years: 60.00    Types: Cigarettes    Start date: 09/16/1952  . Smokeless tobacco: Current User    Types: Snuff  . Tobacco comment: < 1 ppd; does dip sometimes  Substance and Sexual Activity  . Alcohol use: Yes  . Drug use: No  . Sexual activity: Not on file  Other Topics Concern  . Not on file  Social History Narrative   Widower - wife passed away early 2017-01-02. Son lives nearby [place]), brings meals daily    Daughter lives in [place]   He is very active   He was in the [employer] for 4 years.    Activity: walking daily since back surgery    Diet: good water, not much fruits/vegetables    Right-handed.   Caffeine use: 2 cups coffee, some Pepsi   Social Determinants of Health   Financial Resource Strain:   . Difficulty of Paying Living Expenses: Not on file  Food  Insecurity:   . Worried About Charity fundraiser in the Last Year: Not on file  . Ran Out of Food in the Last Year: Not on file  Transportation Needs:   . Lack of Transportation (Medical): Not on file  . Lack of Transportation (Non-Medical): Not on file  Physical Activity:   . Days of Exercise per Week: Not on file  . Minutes of Exercise per Session: Not on file  Stress:   . Feeling of Stress : Not on file  Social Connections:   . Frequency of Communication with Friends and Family: Not on file  . Frequency of Social Gatherings with Friends and Family: Not on file  . Attends Religious Services: Not on file  . Active Member of Clubs or Organizations: Not on file  . Attends Archivist Meetings: Not on file  . Marital Status: Not on file  Intimate Partner Violence:   . Fear of Current or Ex-Partner: Not on file  . Emotionally Abused: Not on file  . Physically Abused: Not on file  . Sexually Abused: Not on file     PHYSICAL EXAM   Vitals:   10/12/19 1004  BP: (!) 158/93  Pulse: 83  Temp: (!) 97 F (36.1 C)  Weight: 160 lb (72.6 kg)  Height: 5' 9.25" (1.759 m)    Not recorded      Body mass index is 23.46 kg/m.  PHYSICAL EXAMNIATION:  Gen: NAD, conversant, well nourised, well groomed                     Cardiovascular: Regular rate rhythm, no peripheral edema, warm, nontender. Eyes: Conjunctivae clear without exudates or hemorrhage Neck: Supple, no carotid bruits. Pulmonary: Frequent cough, expiration wheezing sound  NEUROLOGICAL EXAM:  MENTAL STATUS: MMSE - Mini Mental State Exam 10/12/2019 10/01/2016  Orientation to time 2 5  Orientation to Place 5 5  Registration 3 3  Attention/ Calculation 1 0  Recall 0 1  Recall-comments - pt was unable to recall 2 of 3 words  Language- name 2 objects 2 0  Language- repeat 1 1  Language- follow 3 step command 3 3  Language- read & follow direction 1 0  Write a sentence 1 0  Copy design 1 0  Total score 20 18   Animal naming 7   CRANIAL NERVES: CN II: Visual fields are full to confrontation. Pupils are round equal and briskly reactive to light. CN III, IV, VI: extraocular movement are normal. No ptosis. CN V: Facial sensation is intact to light touch CN VII: Face is symmetric with normal eye closure  CN VIII: Hearing is normal to causal conversation. CN IX, X: Phonation is normal. CN XI: Head turning and shoulder shrug are intact  MOTOR: There is no pronator drift of out-stretched arms. Muscle bulk and tone are normal. Muscle strength is normal.  REFLEXES: Reflexes are 2+ and symmetric at the biceps, triceps, knees, and ankles. Plantar responses are flexor.  SENSORY: Intact to light touch, pinprick and vibratory sensation are intact in fingers and toes.  COORDINATION: There is no trunk or limb dysmetria noted.  GAIT/STANCE: Posture is normal. Gait is steady with normal steps, base, arm swing, and turning. Heel and toe walking are normal. Tandem gait is normal.  Romberg is absent.   DIAGNOSTIC DATA (LABS, IMAGING, TESTING) - I reviewed patient records, labs, notes, testing and imaging myself where available.   ASSESSMENT AND PLAN  ISAYA CARIAS is a 86 y.o. male   Dementia  Family history of dementia, MRI of the brain also showed advanced supratentorium small vessel disease, likely combination of central nervous system degenerative disorder, with a vascular component, also has B12 deficiency  He does have vascular risk factor of hypertension, hyperlipidemia, diabetes, coronary artery disease, longtime smoker, sedentary lifestyle  I have advised him try moderate exercise,   also spent lengthy time discussed with patient and his son about stop driving, it is very hard to convince him to stop driving  Add on Aricept 10 mg daily  Return to clinic with nurse practitioner Sarah in 6 months  Vitamin B 12 deficiency  Continue B12 supplement   Marcial Pacas, [name] Ph.D.  [facility] [address], [place], [place] [address] Ph: [phone] Fax: [phone]  CC: [name], MD

## 2019-10-13 ENCOUNTER — Other Ambulatory Visit: Payer: Self-pay

## 2019-10-13 ENCOUNTER — Ambulatory Visit (INDEPENDENT_AMBULATORY_CARE_PROVIDER_SITE_OTHER): Payer: Medicare Other | Admitting: Otolaryngology

## 2019-10-13 VITALS — Temp 97.2°F

## 2019-10-13 DIAGNOSIS — H60312 Diffuse otitis externa, left ear: Secondary | ICD-10-CM | POA: Diagnosis not present

## 2019-10-13 DIAGNOSIS — H6123 Impacted cerumen, bilateral: Secondary | ICD-10-CM | POA: Diagnosis not present

## 2019-10-13 NOTE — Progress Notes (Signed)
HPI: Troy [name] is a 86 y.o. male who returns today for evaluation of hearing.  He has had longstanding hearing problems for a number of years.  He had a recent hearing test performed at [facility] that demonstrated bilateral conductive loss in addition to a downsloping severe bilateral sensorineural hearing loss.  He does not want to get hearing aids.  He has had some slight discomfort in the left ear recently..  Past Medical History:  Diagnosis Date  . Aorto-iliac atherosclerosis ([place]) 08/2015   by xray  . Barrett's esophagus    EGD 12/15/10, rpt 2012/12/14, longterm PPI  . Chronic idiopathic constipation 07/17/2010  . Chronic obstructive pulmonary disease (COPD) ([facility])    spirometry with moderate COPD  . Coronary atherosclerosis of artery bypass graft   . Diabetes mellitus type II 12/15/2006  . Gastrointestinal ulcer 04/2011   with hematemesis s/p EGD by [facility]  . GERD (gastroesophageal reflux disease)   . Hearing loss   . HLD (hyperlipidemia)   . HNP (herniated nucleus pulposus), lumbar 2011/12/15   with compression of L5 nerve root and foot drop, also with lumbar DDD s/p surgery  . Hypertrophy of prostate without urinary obstruction and other lower urinary tract symptoms (LUTS)   . Memory loss   . Paroxysmal SVT (supraventricular tachycardia) ([place])    a. 3/14 => converted in ED with adenosine to AFib => NSR;  b. Echo 4/14:  Mild LVH, mild FBSH, EF 55-60%, Gr 1 DD  . Prostate nodule 2007-12-15   Left-benign s/p eval Uro WNL ([facility])  . Thrombocytopenia ([place])   . Tobacco use disorder    Past Surgical History:  Procedure Laterality Date  . COLONOSCOPY  03/2009   small hemorrhoids, 2 polyps (hyperplastic and adenomatous), rpt 5 yrs (Dr. Barron Schmid) - pt declined rpt scheduling  . COLONOSCOPY  10/2014   2 hyperplastic polyp, hemmorhoids ([facility])  . CORONARY ARTERY BYPASS GRAFT  1999   Dr. Roxy Manns  . ESOPHAGOGASTRODUODENOSCOPY  05/16/2011   after hematemesis, no lesions found, consistent with barrett's esophagus  rpt 2 yrs  . ESOPHAGOGASTRODUODENOSCOPY  06/2013   barrett's, small HH, recheck 2-3 yrs ([facility])  . HEMORRHOID SURGERY    . LUMBAR LAMINECTOMY/DECOMPRESSION MICRODISCECTOMY  01/31/2012   Procedure: LUMBAR LAMINECTOMY/DECOMPRESSION MICRODISCECTOMY 1 LEVEL;  Surgeon: Winfield Cunas, MD;  Location: [place] [facility];  Service: Neurosurgery;  Laterality: Left;  LEFT Lumbar Four-Five Diskectomy  . NOSE SURGERY    . ROTATOR CUFF REPAIR  04/2010   right (but has bilat) Dr. Latanya Maudlin ([facility])   Social History   Socioeconomic History  . Marital status: Widowed    Spouse name: Not on file  . Number of children: 2  . Years of education: 29  . Highest education level: High school graduate  Occupational History  . Occupation: Retired from [employer]  Tobacco Use  . Smoking status: Current Every Day Smoker    Packs/day: 1.00    Years: 60.00    Pack years: 60.00    Types: Cigarettes    Start date: 09/16/1952  . Smokeless tobacco: Current User    Types: Snuff  . Tobacco comment: < 1 ppd; does dip sometimes  Substance and Sexual Activity  . Alcohol use: Yes  . Drug use: No  . Sexual activity: Not on file  Other Topics Concern  . Not on file  Social History Narrative   Widower - wife passed away early 12/14/2016. Son lives nearby [place]), brings meals daily    Daughter  lives in [place]   He is very active   He was in the [employer] for 4 years.    Activity: walking daily since back surgery    Diet: good water, not much fruits/vegetables    Right-handed.   Caffeine use: 2 cups coffee, some Pepsi   Social Determinants of Health   Financial Resource Strain:   . Difficulty of Paying Living Expenses: Not on file  Food Insecurity:   . Worried About Charity fundraiser in the Last Year: Not on file  . Ran Out of Food in the Last Year: Not on file  Transportation Needs:   . Lack of Transportation (Medical): Not on file  . Lack of Transportation (Non-Medical): Not on file  Physical  Activity:   . Days of Exercise per Week: Not on file  . Minutes of Exercise per Session: Not on file  Stress:   . Feeling of Stress : Not on file  Social Connections:   . Frequency of Communication with Friends and Family: Not on file  . Frequency of Social Gatherings with Friends and Family: Not on file  . Attends Religious Services: Not on file  . Active Member of Clubs or Organizations: Not on file  . Attends Archivist Meetings: Not on file  . Marital Status: Not on file   Family History  Problem Relation Age of Onset  . Cancer Father 64       colon  . Memory loss Mother    Allergies  Allergen Reactions  . Codeine     REACTION: vomiting  . Cyclobenzaprine Hcl     REACTION: vomiting  . Hydrocodone Nausea Only and Other (See Comments)    "crazy"  . Tramadol Other (See Comments)    woozy  . Valium Other (See Comments)    sedation   Prior to Admission medications   Medication Sig Start Date End Date Taking? Authorizing Provider  albuterol (VENTOLIN HFA) 108 (90 Base) MCG/ACT inhaler Inhale 2 puffs into the lungs every 6 (six) hours as needed for wheezing or shortness of breath. 08/06/19  Yes [name], MD  aspirin 81 MG tablet Take 81 mg by mouth daily.    Yes [provider]  fluticasone (FLONASE) 50 MCG/ACT nasal spray Place 2 sprays into both nostrils daily. 08/02/19  Yes [name], MD  fluticasone furoate-vilanterol (BREO ELLIPTA) 100-25 MCG/INH AEPB Inhale 1 puff into the lungs daily. 08/02/19  Yes [name], MD  isosorbide mononitrate (IMDUR) 30 MG 24 hr tablet Take 0.5 tablets (15 mg total) by mouth daily. 08/02/19  Yes [name], MD  magnesium hydroxide (MILK OF MAGNESIA) 400 MG/5ML suspension Take 15 mLs by mouth daily as needed for moderate constipation. 11/01/14  Yes [name], MD  memantine (NAMENDA) 10 MG tablet Take 1 tablet (10 mg total) by mouth 2 (two) times daily. 08/02/19  Yes [name], MD   metFORMIN (GLUCOPHAGE) 500 MG tablet Take 1 tablet (500 mg total) by mouth 2 (two) times daily with a meal. 08/02/19  Yes [name], MD  metoprolol tartrate (LOPRESSOR) 25 MG tablet TAKE ONE-HALF TABLET BY  MOUTH TWICE A DAY 08/02/19  Yes [name], MD  Multiple Vitamins-Minerals (MULTIVITAMIN ADULT) TABS Take 1 tablet by mouth daily. 10/01/18  Yes [name], MD  nystatin (MYCOSTATIN/NYSTOP) powder Apply topically 3 (three) times daily. 12/22/17  Yes [name], MD  omeprazole (PRILOSEC) 40 MG capsule Take 1 capsule (40 mg total) by mouth daily. 08/02/19  Yes Danise Mina,  Garlon Hatchet, MD  sertraline (ZOLOFT) 50 MG tablet Take 1 tablet (50 mg total) by mouth daily. 08/02/19  Yes [name], MD  simvastatin (ZOCOR) 40 MG tablet Take 1 tablet (40 mg total) by mouth at bedtime. 08/02/19  Yes [name], MD  vitamin B-12 (CYANOCOBALAMIN) 1000 MCG tablet Take 1,000 mcg by mouth daily.   Yes [provider]     Positive ROS: Otherwise negative  All other systems have been reviewed and were otherwise negative with the exception of those mentioned in the HPI and as above.  Physical Exam: Constitutional: Alert, well-appearing, no acute distress Ears: External ears without lesions or tenderness.  He has very narrowed ear canals bilaterally.  Ear canals were cleaned in the office today had a moderate amount of wax on the right side.  The right TM was clear.  Left ear canal with the old inflammatory changes with some drainage in the left ear consistent with a left external otitis.  This ear canal was cleaned with suction.  Placed Ciprodex and CSF powder in the left ear canal after cleaning the left ear canal warm.  On tuning fork testing AC was equivocal to [facility] on the right side as well as the left side Nasal: External nose without lesions.  He has had previous nasal surgery with widely patent nasal passages bilaterally.  Minimal rhinitis.  No signs of  infection. Oral: Lips and gums without lesions. Tongue and palate mucosa without lesions. Posterior oropharynx clear. Neck: No palpable adenopathy or masses Respiratory: Breathing comfortably  Skin: No facial/neck lesions or rash noted.  Cerumen impaction removal  Date/Time: 10/13/2019 3:28 PM Performed by: Rozetta Nunnery, MD Authorized by: Rozetta Nunnery, MD   Consent:    Consent obtained:  Verbal   Consent given by:  Patient   Risks discussed:  Pain and bleeding Procedure details:    Location:  L ear and R ear   Procedure type: suction and forceps   Post-procedure details:    Inspection:  TM intact and canal normal   Hearing quality:  Improved   Patient tolerance of procedure:  Tolerated well, no immediate complications Comments:     Patient with small stenotic ear canals bilaterally.  He had slight inflammatory changes on the left side and applied Ciprodex and CSF powder to the left ear.    Assessment: Narrowed ear canals bilaterally with moderate wax buildup on the right side and left external otitis. Moderate severe downsloping sensorineural hearing loss in both ears.  Plan: Recommended Cortisporin otic suspension drops in the left ear twice daily for the next week. He really needs hearing aids in both ears.   Radene Journey, MD

## 2019-10-14 ENCOUNTER — Encounter (INDEPENDENT_AMBULATORY_CARE_PROVIDER_SITE_OTHER): Payer: Self-pay

## 2019-10-18 ENCOUNTER — Telehealth: Payer: Self-pay | Admitting: Neurology

## 2019-10-18 MED ORDER — DONEPEZIL HCL 10 MG PO TABS: 10.0000 mg | ORAL_TABLET | Freq: Every day | ORAL | 3 refills | Status: DC

## 2019-10-18 NOTE — Telephone Encounter (Signed)
Pt's son called stating that the [facility] in [place] is not receiving the [facility] for the pts Aricept 10mg  They would like to know if it can be resent. Please advise.

## 2019-10-18 NOTE — Telephone Encounter (Signed)
Per last office note, donepezil 10mg  daily added. Rx sent to the pharmacy.

## 2019-12-27 ENCOUNTER — Telehealth: Payer: Self-pay

## 2019-12-27 NOTE — Telephone Encounter (Signed)
Troy [name]( DPR signed) left v/m that Troy [name] and his sister want to go back with pt at his 01/24/20 six mth FU. Don request cb after Dr Darnell Level reviews this note.

## 2019-12-27 NOTE — Telephone Encounter (Signed)
Pt with dementia concerns. Would agree to have son and daughter come back with him for AMW if ok by office policy.

## 2019-12-28 NOTE — Telephone Encounter (Signed)
Spoke with [name] let him know that it is okay for him and sister to come back to the room with the patient.

## 2020-01-06 ENCOUNTER — Other Ambulatory Visit: Payer: Self-pay | Admitting: *Deleted

## 2020-01-06 MED ORDER — DONEPEZIL HCL 10 MG PO TABS: 10.0000 mg | ORAL_TABLET | Freq: Every day | ORAL | 3 refills | Status: DC

## 2020-01-24 ENCOUNTER — Other Ambulatory Visit: Payer: Self-pay

## 2020-01-24 ENCOUNTER — Encounter: Payer: Self-pay | Admitting: Family Medicine

## 2020-01-24 ENCOUNTER — Ambulatory Visit (INDEPENDENT_AMBULATORY_CARE_PROVIDER_SITE_OTHER): Payer: Medicare Other | Admitting: Family Medicine

## 2020-01-24 VITALS — BP 174/86 | HR 64 | Temp 97.8°F | Ht 69.25 in | Wt 159.6 lb

## 2020-01-24 DIAGNOSIS — L602 Onychogryphosis: Secondary | ICD-10-CM

## 2020-01-24 DIAGNOSIS — F0390 Unspecified dementia without behavioral disturbance: Secondary | ICD-10-CM

## 2020-01-24 DIAGNOSIS — I708 Atherosclerosis of other arteries: Secondary | ICD-10-CM

## 2020-01-24 DIAGNOSIS — I10 Essential (primary) hypertension: Secondary | ICD-10-CM

## 2020-01-24 DIAGNOSIS — R4189 Other symptoms and signs involving cognitive functions and awareness: Secondary | ICD-10-CM

## 2020-01-24 DIAGNOSIS — E118 Type 2 diabetes mellitus with unspecified complications: Secondary | ICD-10-CM

## 2020-01-24 DIAGNOSIS — K279 Peptic ulcer, site unspecified, unspecified as acute or chronic, without hemorrhage or perforation: Secondary | ICD-10-CM

## 2020-01-24 DIAGNOSIS — E538 Deficiency of other specified B group vitamins: Secondary | ICD-10-CM

## 2020-01-24 DIAGNOSIS — E1136 Type 2 diabetes mellitus with diabetic cataract: Secondary | ICD-10-CM | POA: Insufficient documentation

## 2020-01-24 DIAGNOSIS — E1159 Type 2 diabetes mellitus with other circulatory complications: Secondary | ICD-10-CM

## 2020-01-24 DIAGNOSIS — J449 Chronic obstructive pulmonary disease, unspecified: Secondary | ICD-10-CM | POA: Diagnosis not present

## 2020-01-24 DIAGNOSIS — I700 Atherosclerosis of aorta: Secondary | ICD-10-CM

## 2020-01-24 DIAGNOSIS — K2270 Barrett's esophagus without dysplasia: Secondary | ICD-10-CM

## 2020-01-24 LAB — POCT GLYCOSYLATED HEMOGLOBIN (HGB A1C): Hemoglobin A1C: 5.8 % — AB (ref 4.0–5.6)

## 2020-01-24 LAB — VITAMIN B12: Vitamin B-12: >1500 pg/mL — ABNORMAL HIGH (ref 211–911)

## 2020-01-24 MED ORDER — ISOSORBIDE MONONITRATE ER 30 MG PO TB24: 30.0000 mg | ORAL_TABLET | Freq: Every day | ORAL | 3 refills | Status: DC

## 2020-01-24 MED ORDER — METFORMIN HCL 500 MG PO TABS: 500.0000 mg | ORAL_TABLET | Freq: Every day | ORAL | 3 refills | Status: DC

## 2020-01-24 NOTE — Assessment & Plan Note (Signed)
BP elevated today - will increase imdur to 30mg  daily.  Asked them to start monitoring BP at home and let me know BP readings.

## 2020-01-24 NOTE — Assessment & Plan Note (Signed)
Continue aspirin, statin.  

## 2020-01-24 NOTE — Assessment & Plan Note (Signed)
Update b12 level with regular B12 supplementation.

## 2020-01-24 NOTE — Patient Instructions (Addendum)
Sugars are doing great! Drop metformin to 500mg  once daily.  Continue current medicines otherwise, make sure you're taking b12 supplement daily.  Blood pressures are staying elevated - increase imdur (isosorbide) to 1 tablet daily (30mg  daily). Watch for headache with increase.  Start tracking blood pressures at home and let me know how they're running.  Return as needed or in 6 months for physical/wellness visit.

## 2020-01-24 NOTE — Assessment & Plan Note (Signed)
Stable period on breo 1 puff daily - continue.

## 2020-01-24 NOTE — Assessment & Plan Note (Signed)
Encouraged ophtho f/u.

## 2020-01-24 NOTE — Assessment & Plan Note (Addendum)
Chronic, improvement noted - will decrease metformin to 500mg  once daily. Recommend well balance diet

## 2020-01-24 NOTE — Progress Notes (Addendum)
This visit was conducted in person.  BP (!) 174/86 (BP Location: Right Arm, Patient Position: Sitting, Cuff Size: Normal)   Pulse 64   Temp 97.8 F (36.6 C) (Temporal)   Ht 5' 9.25" (1.759 m)   Wt 159 lb 9 oz (72.4 kg)   SpO2 97%   BMI 23.39 kg/m   170/80s on repeat CC: 6 mo f/u visit  Subjective:    Patient ID: [name], male    DOB: [birth year], 86 y.o.   MRN: [id number]  HPI: ROQUAN HARTONG is a 86 y.o. male presenting on 01/24/2020 for Follow-up (Here for 6 mo f/u.  Pt accompanied by son, [name]- temp 98.2 and daughter, Santiago Glad- temp 97.8.)   Lives at home alone, son and daughter involved.   Since last seen, has seen ENT - with cerumen disimpaction, rec cortisporin otic suspension x 1 wk, recommended hearing aides.   Also saw neurology (Dr Krista Blue) 09/2019 - diagnosis of dementia aricept 10mg  was added on to namenda 10mg  bid. Advised to stop driving.  HTN - compliant with imdur and metoprolol. Doesn't check BP at home.   COPD - no dyspnea or wheezing.  CAD - no chest pain or tightness.  DM - compliant with metformin 500mg  bid. Son endorses less sweets over the past 6 months. Discussed well balanced diet.  Diabetic Foot Exam - Simple   Simple Foot Form Diabetic Foot exam was performed with the following findings: Yes 01/24/2020  8:35 AM  Visual Inspection No deformities, no ulcerations, no other skin breakdown bilaterally: Yes Sensation Testing See comments: Yes Pulse Check Posterior Tibialis and Dorsalis pulse intact bilaterally: Yes Comments Diminished sensation to monofilament bilaterally Dry skin bilaterally          Relevant past medical, surgical, family and social history reviewed and updated as indicated. Interim medical history since our last visit reviewed. Allergies and medications reviewed and updated. Outpatient Medications Prior to Visit  Medication Sig Dispense Refill  . albuterol (VENTOLIN HFA) 108 (90 Base) MCG/ACT inhaler Inhale 2 puffs into  the lungs every 6 (six) hours as needed for wheezing or shortness of breath. 18 g 3  . aspirin 81 MG tablet Take 81 mg by mouth daily.     [name] donepezil (ARICEPT) 10 MG tablet Take 1 tablet (10 mg total) by mouth at bedtime. 90 tablet 3  . fluticasone (FLONASE) 50 MCG/ACT nasal spray Place 2 sprays into both nostrils daily. 16 g 6  . fluticasone furoate-vilanterol (BREO ELLIPTA) 100-25 MCG/INH AEPB Inhale 1 puff into the lungs daily. 1 each 11  . magnesium hydroxide (MILK OF MAGNESIA) 400 MG/5ML suspension Take 15 mLs by mouth daily as needed for moderate constipation. 360 mL 0  . memantine (NAMENDA) 10 MG tablet Take 1 tablet (10 mg total) by mouth 2 (two) times daily. 60 tablet 11  . metoprolol tartrate (LOPRESSOR) 25 MG tablet TAKE ONE-HALF TABLET BY  MOUTH TWICE A DAY 90 tablet 3  . nystatin (MYCOSTATIN/NYSTOP) powder Apply topically 3 (three) times daily. 45 g 0  . omeprazole (PRILOSEC) 40 MG capsule Take 1 capsule (40 mg total) by mouth daily. 90 capsule 3  . sertraline (ZOLOFT) 50 MG tablet Take 1 tablet (50 mg total) by mouth daily. 90 tablet 3  . simvastatin (ZOCOR) 40 MG tablet Take 1 tablet (40 mg total) by mouth at bedtime. 90 tablet 3  . vitamin B-12 (CYANOCOBALAMIN) 1000 MCG tablet Take 1,000 mcg by mouth daily.    . isosorbide mononitrate (  IMDUR) 30 MG 24 hr tablet Take 0.5 tablets (15 mg total) by mouth daily. 45 tablet 3  . metFORMIN (GLUCOPHAGE) 500 MG tablet Take 1 tablet (500 mg total) by mouth 2 (two) times daily with a meal. 180 tablet 3  . Multiple Vitamins-Minerals (MULTIVITAMIN ADULT) TABS Take 1 tablet by mouth daily.     No facility-administered medications prior to visit.     Per HPI unless specifically indicated in ROS section below Review of Systems Objective:  BP (!) 174/86 (BP Location: Right Arm, Patient Position: Sitting, Cuff Size: Normal)   Pulse 64   Temp 97.8 F (36.6 C) (Temporal)   Ht 5' 9.25" (1.759 m)   Wt 159 lb 9 oz (72.4 kg)   SpO2 97%   BMI  23.39 kg/m   Wt Readings from Last 3 Encounters:  01/24/20 159 lb 9 oz (72.4 kg)  10/12/19 160 lb (72.6 kg)  08/02/19 159 lb 7 oz (72.3 kg)      Physical Exam Vitals and nursing note reviewed.  Constitutional:      Appearance: Normal appearance. He is well-developed. He is not ill-appearing.  Eyes:     General: No scleral icterus.    Extraocular Movements: Extraocular movements intact.     Conjunctiva/sclera: Conjunctivae normal.     Pupils: Pupils are equal, round, and reactive to light.     Comments: Cataracts present bilaterally  Cardiovascular:     Rate and Rhythm: Normal rate and regular rhythm.     Pulses: Normal pulses.     Heart sounds: Normal heart sounds. No murmur.  Pulmonary:     Effort: Pulmonary effort is normal. No respiratory distress.     Breath sounds: Normal breath sounds. No wheezing, rhonchi or rales.  Musculoskeletal:     Cervical back: Normal range of motion and neck supple.     Right lower leg: No edema.     Left lower leg: No edema.     Comments: See HPI for foot exam if done  Lymphadenopathy:     Cervical: No cervical adenopathy.  Skin:    General: Skin is warm and dry.     Findings: No rash.  Neurological:     Mental Status: He is alert.  Psychiatric:        Mood and Affect: Mood normal.        Behavior: Behavior normal.       Results for orders placed or performed in visit on 01/24/20  Vitamin B12  Result Value Ref Range   Vitamin B-12 >1500 (H) 211 - 911 pg/mL  POCT glycosylated hemoglobin (Hb A1C)  Result Value Ref Range   Hemoglobin A1C 5.8 (A) 4.0 - 5.6 %   HbA1c POC (<> result, manual entry)     HbA1c, POC (prediabetic range)     HbA1c, POC (controlled diabetic range)     Assessment & Plan:  This visit occurred during the SARS-CoV-2 public health emergency.  Safety protocols were in place, including screening questions prior to the visit, additional usage of staff PPE, and extensive cleaning of exam room while observing  appropriate contact time as indicated for disinfecting solutions.   Problem List Items Addressed This Visit    Vitamin B12 deficiency    Update b12 level with regular B12 supplementation.       Relevant Orders   Vitamin B12 (Completed)   PUD (peptic ulcer disease)    Continue PPI daily.  Caution with aricept use.  Pseudodementia    Continue sertraline 50mg  daily.       Hypertension associated with diabetes ([place])    BP elevated today - will increase imdur to 30mg  daily.  Asked them to start monitoring BP at home and let me know BP readings.       Relevant Medications   metFORMIN (GLUCOPHAGE) 500 MG tablet   isosorbide mononitrate (IMDUR) 30 MG 24 hr tablet   Diabetic cataract of both eyes ([place])    Encouraged ophtho f/u.      Relevant Medications   metFORMIN (GLUCOPHAGE) 500 MG tablet   Diabetes mellitus type 2 with complications (HCC)    Chronic, improvement noted - will decrease metformin to 500mg  once daily. Recommend well balance diet       Relevant Medications   metFORMIN (GLUCOPHAGE) 500 MG tablet   Other Relevant Orders   POCT glycosylated hemoglobin (Hb A1C) (Completed)   Dementia without behavioral disturbance ([place]) - Primary    Appreciate neuro care. Now on namenda and aricept. Has f/u scheduled 03/2020.       COPD (chronic obstructive pulmonary disease) (HCC)    Stable period on breo 1 puff daily - continue.       Barrett's esophagus    Continue PPI indefinitely.       Aorto-iliac atherosclerosis (HCC)    Continue aspirin, statin.       Relevant Medications   isosorbide mononitrate (IMDUR) 30 MG 24 hr tablet       Meds ordered this encounter  Medications  . metFORMIN (GLUCOPHAGE) 500 MG tablet    Sig: Take 1 tablet (500 mg total) by mouth daily with breakfast.    Dispense:  90 tablet    Refill:  3  . isosorbide mononitrate (IMDUR) 30 MG 24 hr tablet    Sig: Take 1 tablet (30 mg total) by mouth daily.    Dispense:  90 tablet    Refill:   3    Note new sig   Orders Placed This Encounter  Procedures  . Vitamin B12  . POCT glycosylated hemoglobin (Hb A1C)    Patient Instructions  Sugars are doing great! Drop metformin to 500mg  once daily.  Continue current medicines otherwise, make sure you're taking b12 supplement daily.  Blood pressures are staying elevated - increase imdur (isosorbide) to 1 tablet daily (30mg  daily). Watch for headache with increase.  Start tracking blood pressures at home and let me know how they're running.  Return as needed or in 6 months for physical/wellness visit.   Follow up plan: Return in about 6 months (around 07/26/2020) for annual exam, prior fasting for blood work, medicare wellness visit.  [name], MD

## 2020-01-24 NOTE — Assessment & Plan Note (Signed)
Continue PPI indefinitely. 

## 2020-01-24 NOTE — Assessment & Plan Note (Signed)
Continue PPI daily.  Caution with aricept use.

## 2020-01-24 NOTE — Assessment & Plan Note (Signed)
Continue sertraline 50 mg daily

## 2020-01-24 NOTE — Assessment & Plan Note (Signed)
Appreciate neuro care. Now on namenda and aricept. Has f/u scheduled 03/2020.

## 2020-01-27 ENCOUNTER — Other Ambulatory Visit: Payer: Self-pay | Admitting: Family Medicine

## 2020-01-27 MED ORDER — VITAMIN B-12 500 MCG PO TABS: 500.0000 ug | ORAL_TABLET | ORAL | Status: DC

## 2020-01-31 ENCOUNTER — Ambulatory Visit: Payer: Medicare Other | Admitting: Family Medicine

## 2020-02-10 ENCOUNTER — Other Ambulatory Visit: Payer: Self-pay

## 2020-02-10 ENCOUNTER — Encounter: Payer: Self-pay | Admitting: Podiatry

## 2020-02-10 ENCOUNTER — Ambulatory Visit (INDEPENDENT_AMBULATORY_CARE_PROVIDER_SITE_OTHER): Payer: Medicare Other | Admitting: Podiatry

## 2020-02-10 DIAGNOSIS — E1140 Type 2 diabetes mellitus with diabetic neuropathy, unspecified: Secondary | ICD-10-CM | POA: Diagnosis not present

## 2020-02-10 DIAGNOSIS — M79675 Pain in left toe(s): Secondary | ICD-10-CM | POA: Diagnosis not present

## 2020-02-10 DIAGNOSIS — M79674 Pain in right toe(s): Secondary | ICD-10-CM

## 2020-02-10 DIAGNOSIS — B351 Tinea unguium: Secondary | ICD-10-CM | POA: Diagnosis not present

## 2020-02-10 DIAGNOSIS — E1149 Type 2 diabetes mellitus with other diabetic neurological complication: Secondary | ICD-10-CM

## 2020-02-10 NOTE — Progress Notes (Signed)
Subjective:   Patient ID: Troy [name], male   DOB: 86 y.o.   MRN: [id number]   HPI Patient presents with significant nail disease 1-5 both feet that become incurvated he cannot cut anymore and he would like them taken care of as it is impossible for him to do this.  He also has diabetes presents with caregiver smokes 1 pack/day and is not currently active   Review of Systems  All other systems reviewed and are negative.       Objective:  Physical Exam Vitals and nursing note reviewed.  Constitutional:      Appearance: He is well-developed.  Pulmonary:     Effort: Pulmonary effort is normal.  Musculoskeletal:        General: Normal range of motion.  Skin:    General: Skin is warm.  Neurological:     Mental Status: He is alert.     Vascular status found to be moderately diminished bilateral with patient having diminishment of sharp dull vibratory.  He is found to have thickened nailbeds 1-5 both feet that are incurvated sore when palpated and are making shoe gear difficult.  Patient is found to have good digital perfusion well oriented x3     Assessment:  Chronic mycotic nail infection with risk factors and pain 1-5 both feet     Plan:  H&P diabetic education rendered along with advice on every day inspections of his feet.  Debrided nailbeds 1-5 both feet with no iatrogenic bleeding and reappoint for routine care

## 2020-03-09 ENCOUNTER — Encounter: Payer: Self-pay | Admitting: Family Medicine

## 2020-03-13 MED ORDER — METOPROLOL TARTRATE 25 MG PO TABS: ORAL_TABLET | ORAL | 3 refills | Status: DC

## 2020-04-10 ENCOUNTER — Other Ambulatory Visit: Payer: Self-pay

## 2020-04-10 ENCOUNTER — Ambulatory Visit: Payer: Medicare Other | Admitting: Neurology

## 2020-04-10 ENCOUNTER — Encounter: Payer: Self-pay | Admitting: Neurology

## 2020-04-10 VITALS — BP 152/82 | HR 56 | Ht 69.0 in | Wt 158.0 lb

## 2020-04-10 DIAGNOSIS — F0390 Unspecified dementia without behavioral disturbance: Secondary | ICD-10-CM

## 2020-04-10 DIAGNOSIS — I679 Cerebrovascular disease, unspecified: Secondary | ICD-10-CM

## 2020-04-10 MED ORDER — DONEPEZIL HCL 10 MG PO TABS: 10.0000 mg | ORAL_TABLET | Freq: Every day | ORAL | 3 refills | Status: DC

## 2020-04-10 MED ORDER — MEMANTINE HCL 10 MG PO TABS: 10.0000 mg | ORAL_TABLET | Freq: Two times a day (BID) | ORAL | 4 refills | Status: DC
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
## 2020-05-16 ENCOUNTER — Other Ambulatory Visit: Payer: Self-pay

## 2020-05-16 ENCOUNTER — Ambulatory Visit: Payer: Medicare Other | Admitting: Podiatry

## 2020-05-16 ENCOUNTER — Encounter: Payer: Self-pay | Admitting: Podiatry

## 2020-05-16 DIAGNOSIS — E1149 Type 2 diabetes mellitus with other diabetic neurological complication: Secondary | ICD-10-CM | POA: Diagnosis not present

## 2020-05-16 DIAGNOSIS — M79675 Pain in left toe(s): Secondary | ICD-10-CM

## 2020-05-16 DIAGNOSIS — E1140 Type 2 diabetes mellitus with diabetic neuropathy, unspecified: Secondary | ICD-10-CM | POA: Diagnosis not present

## 2020-05-16 DIAGNOSIS — B351 Tinea unguium: Secondary | ICD-10-CM

## 2020-05-16 DIAGNOSIS — M79674 Pain in right toe(s): Secondary | ICD-10-CM | POA: Diagnosis not present

## 2020-05-16 NOTE — Progress Notes (Signed)
This patient returns to my office for at risk foot care.  This patient requires this care by a professional since this patient will be at risk due to having diabetes and thrombocytopenia This patient is unable to cut nails himself since the patient cannot reach his nails.These nails are painful walking and wearing shoes.  This patient presents for at risk foot care today.  General Appearance  Alert, conversant and in no acute stress.  Vascular  Dorsalis pedis and posterior tibial  pulses are palpable  bilaterally.  Capillary return is within normal limits  bilaterally. Temperature is within normal limits  bilaterally.  Neurologic  Senn-Weinstein monofilament wire test diminished   bilaterally. Muscle power within normal limits bilaterally.  Nails Thick disfigured discolored nails with subungual debris  from hallux to fifth toes bilaterally. No evidence of bacterial infection or drainage bilaterally.  Orthopedic  No limitations of motion  feet .  No crepitus or effusions noted.  No bony pathology or digital deformities noted.  Skin  normotropic skin with no porokeratosis noted bilaterally.  No signs of infections or ulcers noted.     Onychomycosis  Pain in right toes  Pain in left toes  Consent was obtained for treatment procedures.   Mechanical debridement of nails 1-5  bilaterally performed with a nail nipper.  Filed with dremel without incident.    Return office visit   4 months                   Told patient to return for periodic foot care and evaluation due to potential at risk complications.   Gardiner Barefoot DPM

## 2020-05-19 ENCOUNTER — Other Ambulatory Visit: Payer: Self-pay | Admitting: Family Medicine

## 2020-05-23 ENCOUNTER — Other Ambulatory Visit: Payer: Self-pay | Admitting: Family Medicine

## 2020-05-24 ENCOUNTER — Other Ambulatory Visit: Payer: Self-pay | Admitting: Family Medicine

## 2020-05-24 NOTE — Telephone Encounter (Signed)
E-scribed refill.  Plz schedule wellness, cpe and lab visits.  

## 2020-06-12 NOTE — Telephone Encounter (Signed)
Called patient and his son answered. Says he will call back once he gets home and speaks with his father.

## 2020-07-06 ENCOUNTER — Other Ambulatory Visit: Payer: Self-pay | Admitting: Family Medicine

## 2020-07-07 NOTE — Telephone Encounter (Signed)
E-scribed refills.  Plz schedule wellness, cpe and lab visits.  

## 2020-07-10 NOTE — Telephone Encounter (Signed)
Patient is scheduled for Jan. Thank you

## 2020-08-17 ENCOUNTER — Other Ambulatory Visit: Payer: Self-pay | Admitting: Family Medicine

## 2020-08-31 ENCOUNTER — Other Ambulatory Visit: Payer: Self-pay | Admitting: Family Medicine

## 2020-09-10 ENCOUNTER — Inpatient Hospital Stay (HOSPITAL_COMMUNITY): Payer: Medicare Other

## 2020-09-10 ENCOUNTER — Emergency Department (HOSPITAL_COMMUNITY): Payer: Medicare Other

## 2020-09-10 ENCOUNTER — Encounter (HOSPITAL_COMMUNITY): Payer: Self-pay | Admitting: Emergency Medicine

## 2020-09-10 ENCOUNTER — Inpatient Hospital Stay (HOSPITAL_COMMUNITY)
Admission: EM | Admit: 2020-09-10 | Discharge: 2020-09-20 | DRG: 177 | Disposition: A | Discharge status: Skilled Nursing Facility | Payer: Medicare Other | Attending: Internal Medicine | Admitting: Internal Medicine

## 2020-09-10 DIAGNOSIS — N1831 Chronic kidney disease, stage 3a: Secondary | ICD-10-CM | POA: Diagnosis not present

## 2020-09-10 DIAGNOSIS — J1282 Pneumonia due to coronavirus disease 2019: Secondary | ICD-10-CM | POA: Diagnosis present

## 2020-09-10 DIAGNOSIS — N179 Acute kidney failure, unspecified: Secondary | ICD-10-CM

## 2020-09-10 DIAGNOSIS — E785 Hyperlipidemia, unspecified: Secondary | ICD-10-CM | POA: Diagnosis present

## 2020-09-10 DIAGNOSIS — J069 Acute upper respiratory infection, unspecified: Secondary | ICD-10-CM | POA: Diagnosis not present

## 2020-09-10 DIAGNOSIS — N171 Acute kidney failure with acute cortical necrosis: Secondary | ICD-10-CM | POA: Diagnosis not present

## 2020-09-10 DIAGNOSIS — I5032 Chronic diastolic (congestive) heart failure: Secondary | ICD-10-CM

## 2020-09-10 DIAGNOSIS — I130 Hypertensive heart and chronic kidney disease with heart failure and stage 1 through stage 4 chronic kidney disease, or unspecified chronic kidney disease: Secondary | ICD-10-CM | POA: Diagnosis present

## 2020-09-10 DIAGNOSIS — J441 Chronic obstructive pulmonary disease with (acute) exacerbation: Secondary | ICD-10-CM | POA: Diagnosis not present

## 2020-09-10 DIAGNOSIS — R0602 Shortness of breath: Secondary | ICD-10-CM

## 2020-09-10 DIAGNOSIS — I129 Hypertensive chronic kidney disease with stage 1 through stage 4 chronic kidney disease, or unspecified chronic kidney disease: Secondary | ICD-10-CM | POA: Diagnosis not present

## 2020-09-10 DIAGNOSIS — Z881 Allergy status to other antibiotic agents status: Secondary | ICD-10-CM

## 2020-09-10 DIAGNOSIS — Z66 Do not resuscitate: Secondary | ICD-10-CM | POA: Diagnosis present

## 2020-09-10 DIAGNOSIS — H9190 Unspecified hearing loss, unspecified ear: Secondary | ICD-10-CM | POA: Diagnosis present

## 2020-09-10 DIAGNOSIS — M6281 Muscle weakness (generalized): Secondary | ICD-10-CM | POA: Diagnosis not present

## 2020-09-10 DIAGNOSIS — Z951 Presence of aortocoronary bypass graft: Secondary | ICD-10-CM

## 2020-09-10 DIAGNOSIS — Z8711 Personal history of peptic ulcer disease: Secondary | ICD-10-CM

## 2020-09-10 DIAGNOSIS — E1169 Type 2 diabetes mellitus with other specified complication: Secondary | ICD-10-CM | POA: Diagnosis present

## 2020-09-10 DIAGNOSIS — Z7951 Long term (current) use of inhaled steroids: Secondary | ICD-10-CM

## 2020-09-10 DIAGNOSIS — K219 Gastro-esophageal reflux disease without esophagitis: Secondary | ICD-10-CM | POA: Diagnosis present

## 2020-09-10 DIAGNOSIS — R0902 Hypoxemia: Secondary | ICD-10-CM

## 2020-09-10 DIAGNOSIS — I5042 Chronic combined systolic (congestive) and diastolic (congestive) heart failure: Secondary | ICD-10-CM | POA: Diagnosis not present

## 2020-09-10 DIAGNOSIS — D696 Thrombocytopenia, unspecified: Secondary | ICD-10-CM | POA: Diagnosis present

## 2020-09-10 DIAGNOSIS — M542 Cervicalgia: Secondary | ICD-10-CM | POA: Diagnosis not present

## 2020-09-10 DIAGNOSIS — J432 Centrilobular emphysema: Secondary | ICD-10-CM | POA: Diagnosis not present

## 2020-09-10 DIAGNOSIS — J439 Emphysema, unspecified: Secondary | ICD-10-CM | POA: Diagnosis present

## 2020-09-10 DIAGNOSIS — I4891 Unspecified atrial fibrillation: Secondary | ICD-10-CM | POA: Diagnosis not present

## 2020-09-10 DIAGNOSIS — Z888 Allergy status to other drugs, medicaments and biological substances status: Secondary | ICD-10-CM

## 2020-09-10 DIAGNOSIS — J690 Pneumonitis due to inhalation of food and vomit: Secondary | ICD-10-CM | POA: Diagnosis not present

## 2020-09-10 DIAGNOSIS — Z720 Tobacco use: Secondary | ICD-10-CM | POA: Diagnosis not present

## 2020-09-10 DIAGNOSIS — G319 Degenerative disease of nervous system, unspecified: Secondary | ICD-10-CM | POA: Diagnosis not present

## 2020-09-10 DIAGNOSIS — W19XXXA Unspecified fall, initial encounter: Secondary | ICD-10-CM | POA: Diagnosis not present

## 2020-09-10 DIAGNOSIS — E118 Type 2 diabetes mellitus with unspecified complications: Secondary | ICD-10-CM | POA: Diagnosis not present

## 2020-09-10 DIAGNOSIS — E119 Type 2 diabetes mellitus without complications: Secondary | ICD-10-CM | POA: Diagnosis not present

## 2020-09-10 DIAGNOSIS — R911 Solitary pulmonary nodule: Secondary | ICD-10-CM | POA: Diagnosis present

## 2020-09-10 DIAGNOSIS — J431 Panlobular emphysema: Secondary | ICD-10-CM | POA: Diagnosis not present

## 2020-09-10 DIAGNOSIS — K279 Peptic ulcer, site unspecified, unspecified as acute or chronic, without hemorrhage or perforation: Secondary | ICD-10-CM | POA: Diagnosis present

## 2020-09-10 DIAGNOSIS — E1122 Type 2 diabetes mellitus with diabetic chronic kidney disease: Secondary | ICD-10-CM | POA: Diagnosis present

## 2020-09-10 DIAGNOSIS — F17210 Nicotine dependence, cigarettes, uncomplicated: Secondary | ICD-10-CM | POA: Diagnosis not present

## 2020-09-10 DIAGNOSIS — Z7984 Long term (current) use of oral hypoglycemic drugs: Secondary | ICD-10-CM | POA: Diagnosis not present

## 2020-09-10 DIAGNOSIS — Z7401 Bed confinement status: Secondary | ICD-10-CM | POA: Diagnosis not present

## 2020-09-10 DIAGNOSIS — Z885 Allergy status to narcotic agent status: Secondary | ICD-10-CM

## 2020-09-10 DIAGNOSIS — J449 Chronic obstructive pulmonary disease, unspecified: Secondary | ICD-10-CM

## 2020-09-10 DIAGNOSIS — U071 COVID-19: Principal | ICD-10-CM

## 2020-09-10 DIAGNOSIS — F0390 Unspecified dementia without behavioral disturbance: Secondary | ICD-10-CM | POA: Diagnosis present

## 2020-09-10 DIAGNOSIS — R1312 Dysphagia, oropharyngeal phase: Secondary | ICD-10-CM | POA: Diagnosis not present

## 2020-09-10 DIAGNOSIS — I6782 Cerebral ischemia: Secondary | ICD-10-CM | POA: Diagnosis not present

## 2020-09-10 DIAGNOSIS — E860 Dehydration: Secondary | ICD-10-CM | POA: Diagnosis not present

## 2020-09-10 DIAGNOSIS — I2510 Atherosclerotic heart disease of native coronary artery without angina pectoris: Secondary | ICD-10-CM | POA: Diagnosis not present

## 2020-09-10 DIAGNOSIS — R2681 Unsteadiness on feet: Secondary | ICD-10-CM | POA: Diagnosis not present

## 2020-09-10 DIAGNOSIS — R6889 Other general symptoms and signs: Secondary | ICD-10-CM | POA: Diagnosis not present

## 2020-09-10 DIAGNOSIS — I712 Thoracic aortic aneurysm, without rupture: Secondary | ICD-10-CM | POA: Diagnosis not present

## 2020-09-10 DIAGNOSIS — J9601 Acute respiratory failure with hypoxia: Secondary | ICD-10-CM | POA: Diagnosis not present

## 2020-09-10 DIAGNOSIS — Z79899 Other long term (current) drug therapy: Secondary | ICD-10-CM

## 2020-09-10 DIAGNOSIS — M7989 Other specified soft tissue disorders: Secondary | ICD-10-CM | POA: Diagnosis not present

## 2020-09-10 DIAGNOSIS — I25119 Atherosclerotic heart disease of native coronary artery with unspecified angina pectoris: Secondary | ICD-10-CM | POA: Diagnosis not present

## 2020-09-10 DIAGNOSIS — Z743 Need for continuous supervision: Secondary | ICD-10-CM | POA: Diagnosis not present

## 2020-09-10 DIAGNOSIS — R0689 Other abnormalities of breathing: Secondary | ICD-10-CM | POA: Diagnosis not present

## 2020-09-10 DIAGNOSIS — S0990XA Unspecified injury of head, initial encounter: Secondary | ICD-10-CM | POA: Diagnosis not present

## 2020-09-10 DIAGNOSIS — J9690 Respiratory failure, unspecified, unspecified whether with hypoxia or hypercapnia: Secondary | ICD-10-CM | POA: Diagnosis not present

## 2020-09-10 DIAGNOSIS — J929 Pleural plaque without asbestos: Secondary | ICD-10-CM | POA: Diagnosis not present

## 2020-09-10 DIAGNOSIS — Z7982 Long term (current) use of aspirin: Secondary | ICD-10-CM

## 2020-09-10 DIAGNOSIS — M2550 Pain in unspecified joint: Secondary | ICD-10-CM | POA: Diagnosis not present

## 2020-09-10 LAB — COMPREHENSIVE METABOLIC PANEL
ALT: 41 U/L (ref 0–44)
AST: 65 U/L — ABNORMAL HIGH (ref 15–41)
Albumin: 4.2 g/dL (ref 3.5–5.0)
Alkaline Phosphatase: 120 U/L (ref 38–126)
Anion gap: 19 — ABNORMAL HIGH (ref 5–15)
BUN: 58 mg/dL — ABNORMAL HIGH (ref 8–23)
CO2: 13 mmol/L — ABNORMAL LOW (ref 22–32)
Calcium: 9.2 mg/dL (ref 8.9–10.3)
Chloride: 109 mmol/L (ref 98–111)
Creatinine, Ser: 2.63 mg/dL — ABNORMAL HIGH (ref 0.61–1.24)
GFR, Estimated: 23 mL/min — ABNORMAL LOW (ref >60)
Glucose, Bld: 183 mg/dL — ABNORMAL HIGH (ref 70–99)
Potassium: 5.3 mmol/L — ABNORMAL HIGH (ref 3.5–5.1)
Sodium: 141 mmol/L (ref 135–145)
Total Bilirubin: 0.8 mg/dL (ref 0.3–1.2)
Total Protein: 8.3 g/dL — ABNORMAL HIGH (ref 6.5–8.1)

## 2020-09-10 LAB — CBC WITH DIFFERENTIAL/PLATELET
Abs Immature Granulocytes: 0.04 10*3/uL (ref 0.00–0.07)
Basophils Absolute: 0 10*3/uL (ref 0.0–0.1)
Basophils Relative: 0 %
Eosinophils Absolute: 0 10*3/uL (ref 0.0–0.5)
Eosinophils Relative: 0 %
HCT: 42.7 % (ref 39.0–52.0)
Hemoglobin: 14 g/dL (ref 13.0–17.0)
Immature Granulocytes: 0 %
Lymphocytes Relative: 6 %
Lymphs Abs: 0.5 10*3/uL — ABNORMAL LOW (ref 0.7–4.0)
MCH: 31.5 pg (ref 26.0–34.0)
MCHC: 32.8 g/dL (ref 30.0–36.0)
MCV: 96 fL (ref 80.0–100.0)
Monocytes Absolute: 0.5 10*3/uL (ref 0.1–1.0)
Monocytes Relative: 6 %
Neutro Abs: 8 10*3/uL — ABNORMAL HIGH (ref 1.7–7.7)
Neutrophils Relative %: 88 %
Platelets: 102 10*3/uL — ABNORMAL LOW (ref 150–400)
RBC: 4.45 MIL/uL (ref 4.22–5.81)
RDW: 14.4 % (ref 11.5–15.5)
WBC: 9.1 10*3/uL (ref 4.0–10.5)
nRBC: 0 % (ref 0.0–0.2)

## 2020-09-10 LAB — CBG MONITORING, ED: Glucose-Capillary: 130 mg/dL — ABNORMAL HIGH (ref 70–99)

## 2020-09-10 LAB — I-STAT VENOUS BLOOD GAS, ED
Acid-base deficit: 12 mmol/L — ABNORMAL HIGH (ref 0.0–2.0)
Bicarbonate: 15.8 mmol/L — ABNORMAL LOW (ref 20.0–28.0)
Calcium, Ion: 1.13 mmol/L — ABNORMAL LOW (ref 1.15–1.40)
HCT: 42 % (ref 39.0–52.0)
Hemoglobin: 14.3 g/dL (ref 13.0–17.0)
O2 Saturation: 51 %
Potassium: 5.1 mmol/L (ref 3.5–5.1)
Sodium: 143 mmol/L (ref 135–145)
TCO2: 17 mmol/L — ABNORMAL LOW (ref 22–32)
pCO2, Ven: 40.2 mmHg — ABNORMAL LOW (ref 44.0–60.0)
pH, Ven: 7.201 — ABNORMAL LOW (ref 7.250–7.430)
pO2, Ven: 33 mmHg (ref 32.0–45.0)

## 2020-09-10 LAB — RESP PANEL BY RT-PCR (FLU A&B, COVID) ARPGX2
Influenza A by PCR: NEGATIVE
Influenza B by PCR: NEGATIVE
SARS Coronavirus 2 by RT PCR: POSITIVE — AB

## 2020-09-10 LAB — BRAIN NATRIURETIC PEPTIDE: B Natriuretic Peptide: 95.5 pg/mL (ref 0.0–100.0)

## 2020-09-10 LAB — TROPONIN I (HIGH SENSITIVITY): Troponin I (High Sensitivity): 45 ng/L — ABNORMAL HIGH (ref <18)

## 2020-09-10 MED ORDER — PANTOPRAZOLE SODIUM 40 MG PO TBEC
80.0000 mg | DELAYED_RELEASE_TABLET | Freq: Every day | ORAL | Status: DC
  Administered 2020-09-11 – 2020-09-13 (×3): 80 mg via ORAL
  Filled 2020-09-10 (×3): qty 2

## 2020-09-10 MED ORDER — CYANOCOBALAMIN 500 MCG PO TABS
500.0000 ug | ORAL_TABLET | Freq: Every day | ORAL | Status: DC
  Administered 2020-09-11 – 2020-09-19 (×9): 500 ug via ORAL
  Filled 2020-09-10 (×9): qty 1

## 2020-09-10 MED ORDER — INSULIN ASPART 100 UNIT/ML ~~LOC~~ SOLN: 6.0000 [IU] | Freq: Once | SUBCUTANEOUS | Status: DC

## 2020-09-10 MED ORDER — SODIUM CHLORIDE 0.9 % IV SOLN
100.0000 mg | Freq: Every day | INTRAVENOUS | Status: AC
  Administered 2020-09-11 – 2020-09-14 (×4): 100 mg via INTRAVENOUS
  Filled 2020-09-10 (×5): qty 20

## 2020-09-10 MED ORDER — SIMVASTATIN 20 MG PO TABS
40.0000 mg | ORAL_TABLET | Freq: Every day | ORAL | Status: DC
  Administered 2020-09-10 – 2020-09-19 (×10): 40 mg via ORAL
  Filled 2020-09-10 (×11): qty 2

## 2020-09-10 MED ORDER — SODIUM CHLORIDE 0.9 % IV SOLN
200.0000 mg | Freq: Once | INTRAVENOUS | Status: AC
  Administered 2020-09-10: 18:00:00 200 mg via INTRAVENOUS
  Filled 2020-09-10: qty 40

## 2020-09-10 MED ORDER — ENOXAPARIN SODIUM 30 MG/0.3ML ~~LOC~~ SOLN
30.0000 mg | SUBCUTANEOUS | Status: DC
  Administered 2020-09-10 – 2020-09-12 (×3): 30 mg via SUBCUTANEOUS
  Filled 2020-09-10 (×4): qty 0.3

## 2020-09-10 MED ORDER — METHYLPREDNISOLONE SODIUM SUCC 40 MG IJ SOLR
0.5000 mg/kg | Freq: Two times a day (BID) | INTRAMUSCULAR | Status: DC
  Administered 2020-09-11: 02:00:00 35.2 mg via INTRAVENOUS
  Filled 2020-09-10: qty 1

## 2020-09-10 MED ORDER — ACETAMINOPHEN 325 MG PO TABS
650.0000 mg | ORAL_TABLET | Freq: Four times a day (QID) | ORAL | Status: DC | PRN
  Administered 2020-09-11 (×2): 650 mg via ORAL
  Filled 2020-09-10 (×2): qty 2

## 2020-09-10 MED ORDER — PREDNISONE 20 MG PO TABS: 50.0000 mg | ORAL_TABLET | Freq: Every day | ORAL | Status: DC

## 2020-09-10 MED ORDER — ASPIRIN EC 81 MG PO TBEC
81.0000 mg | DELAYED_RELEASE_TABLET | Freq: Every day | ORAL | Status: DC
  Administered 2020-09-11 – 2020-09-13 (×3): 81 mg via ORAL
  Filled 2020-09-10 (×3): qty 1

## 2020-09-10 MED ORDER — SERTRALINE HCL 50 MG PO TABS
50.0000 mg | ORAL_TABLET | Freq: Every day | ORAL | Status: DC
  Administered 2020-09-11 – 2020-09-19 (×9): 50 mg via ORAL
  Filled 2020-09-10 (×9): qty 1

## 2020-09-10 MED ORDER — METOPROLOL TARTRATE 12.5 MG HALF TABLET
12.5000 mg | ORAL_TABLET | Freq: Every day | ORAL | Status: DC
  Administered 2020-09-11 – 2020-09-13 (×3): 12.5 mg via ORAL
  Filled 2020-09-10 (×5): qty 1

## 2020-09-10 MED ORDER — METOPROLOL TARTRATE 25 MG PO TABS
12.5000 mg | ORAL_TABLET | ORAL | Status: DC
Start: 2020-09-10 — End: 2020-09-10

## 2020-09-10 MED ORDER — INSULIN GLARGINE 100 UNIT/ML ~~LOC~~ SOLN
10.0000 [IU] | Freq: Every day | SUBCUTANEOUS | Status: DC
  Administered 2020-09-11 – 2020-09-13 (×3): 10 [IU] via SUBCUTANEOUS
  Filled 2020-09-10 (×5): qty 0.1

## 2020-09-10 MED ORDER — SENNA 8.6 MG PO TABS
1.0000 | ORAL_TABLET | Freq: Two times a day (BID) | ORAL | Status: DC
  Administered 2020-09-10 – 2020-09-17 (×15): 8.6 mg via ORAL
  Filled 2020-09-10 (×18): qty 1

## 2020-09-10 MED ORDER — SODIUM CHLORIDE 0.9 % IV BOLUS
500.0000 mL | Freq: Once | INTRAVENOUS | Status: AC
  Administered 2020-09-10: 16:00:00 500 mL via INTRAVENOUS

## 2020-09-10 MED ORDER — FLUTICASONE FUROATE-VILANTEROL 100-25 MCG/INH IN AEPB
1.0000 | INHALATION_SPRAY | Freq: Every day | RESPIRATORY_TRACT | Status: DC
  Administered 2020-09-11 – 2020-09-14 (×4): 1 via RESPIRATORY_TRACT
  Filled 2020-09-10: qty 28

## 2020-09-10 MED ORDER — METOPROLOL TARTRATE 25 MG PO TABS
25.0000 mg | ORAL_TABLET | Freq: Every morning | ORAL | Status: DC
  Administered 2020-09-11 – 2020-09-13 (×3): 25 mg via ORAL
  Filled 2020-09-10 (×3): qty 1

## 2020-09-10 MED ORDER — NICOTINE 14 MG/24HR TD PT24
14.0000 mg | MEDICATED_PATCH | Freq: Every day | TRANSDERMAL | Status: DC
  Administered 2020-09-11 – 2020-09-19 (×9): 14 mg via TRANSDERMAL
  Filled 2020-09-10 (×9): qty 1

## 2020-09-10 MED ORDER — METHYLPREDNISOLONE SODIUM SUCC 125 MG IJ SOLR
125.0000 mg | Freq: Once | INTRAMUSCULAR | Status: AC
  Administered 2020-09-10: 15:00:00 125 mg via INTRAVENOUS
  Filled 2020-09-10: qty 2

## 2020-09-10 MED ORDER — DONEPEZIL HCL 10 MG PO TABS
10.0000 mg | ORAL_TABLET | Freq: Every day | ORAL | Status: DC
  Administered 2020-09-10 – 2020-09-19 (×10): 10 mg via ORAL
  Filled 2020-09-10 (×11): qty 1

## 2020-09-10 MED ORDER — INSULIN ASPART 100 UNIT/ML ~~LOC~~ SOLN
0.0000 [IU] | Freq: Three times a day (TID) | SUBCUTANEOUS | Status: DC
  Administered 2020-09-10 – 2020-09-14 (×4): 3 [IU] via SUBCUTANEOUS
  Administered 2020-09-15 – 2020-09-16 (×3): 4 [IU] via SUBCUTANEOUS
  Administered 2020-09-16 – 2020-09-17 (×2): 3 [IU] via SUBCUTANEOUS

## 2020-09-10 MED ORDER — ISOSORBIDE MONONITRATE ER 30 MG PO TB24
30.0000 mg | ORAL_TABLET | Freq: Every day | ORAL | Status: DC
  Administered 2020-09-11 – 2020-09-13 (×3): 30 mg via ORAL
  Filled 2020-09-10 (×3): qty 1

## 2020-09-10 MED ORDER — MEMANTINE HCL 10 MG PO TABS
10.0000 mg | ORAL_TABLET | Freq: Two times a day (BID) | ORAL | Status: DC
  Administered 2020-09-10 – 2020-09-19 (×19): 10 mg via ORAL
  Filled 2020-09-10 (×20): qty 1

## 2020-09-10 MED ORDER — TRAZODONE HCL 50 MG PO TABS
25.0000 mg | ORAL_TABLET | Freq: Every evening | ORAL | Status: DC | PRN
  Administered 2020-09-13: 01:00:00 25 mg via ORAL
  Filled 2020-09-10: qty 1

## 2020-09-10 MED ORDER — ALBUTEROL SULFATE HFA 108 (90 BASE) MCG/ACT IN AERS
4.0000 | INHALATION_SPRAY | Freq: Once | RESPIRATORY_TRACT | Status: AC
  Administered 2020-09-10: 18:00:00 4 via RESPIRATORY_TRACT
  Filled 2020-09-10: qty 6.7

## 2020-09-10 MED ORDER — SODIUM CHLORIDE 0.45 % IV SOLN: INTRAVENOUS | Status: AC

## 2020-09-10 MED ORDER — ALBUTEROL SULFATE HFA 108 (90 BASE) MCG/ACT IN AERS: 2.0000 | INHALATION_SPRAY | Freq: Four times a day (QID) | RESPIRATORY_TRACT | Status: DC | PRN

## 2020-09-10 NOTE — H&P (Signed)
History and Physical    TUCK TIN [id number] DOB: Feb 04, 1933 DOA: 09/10/2020  PCP: [name], MD   Patient coming from: home  I have personally briefly reviewed patient's old medical records in [place]  Chief Complaint: SOB, increased confusion  HPI: Troy [name] is a 86 y.o. male with medical history significant of COPD, not on home oxygen, CAD status post CABG, HTN, HLD, DM presents the emergency department with reported fall and shortness of breath. Patient has history of dementia, he is pleasant but appears to be a poor historian. Son is at bedside to aid in history. He states that the patient lives home alone, normally is able to care for himself. He checks in about 2 times a day. This morning he found him sitting in the tub. Unclear of the exact mechanism. He noticed that his dad was really short of breath so he called EMS. He was reportedly hypoxic to 60% on room air, improved on nonrebreather. No reported treatments prior to arrival. Patient denies any pain complaints to me. Patient has a chronic cough with may be increased sputum production but no reports of fever, vomiting, diarrhea.   ED Course: T 98.6 140/61  HR 81 RR 26 O2 sat on hiflo 99%, VBG @ presentation 7.2/40.2/33. Cmet notable for glucose 183 BUN 58, Cr 2.64, WBC 9.1, Hgb 14, Plts 102 diff 88/6/6. Reported to be covid POSITIVE. CXR - COPD, NAD. Patient was administered solumedrol 125 mg IV, Remedesivir ordered. TRH called to admit patient with respiratory failure.   Review of Systems: As per HPI otherwise 10 point review of systems negative.    Past Medical History:  Diagnosis Date  . Aorto-iliac atherosclerosis ([place]) 08/2015   by xray  . Barrett's esophagus    EGD 2012, rpt 2014, longterm PPI  . Chronic idiopathic constipation 07/17/2010  . Chronic obstructive pulmonary disease (COPD) (HCC)    spirometry with moderate COPD  . Coronary atherosclerosis of artery bypass graft   .  Diabetes mellitus type II ~2008  . Gastrointestinal ulcer 04/2011   with hematemesis s/p EGD by [facility]  . GERD (gastroesophageal reflux disease)   . Hearing loss   . HLD (hyperlipidemia)   . HNP (herniated nucleus pulposus), lumbar 2013   with compression of L5 nerve root and foot drop, also with lumbar DDD s/p surgery  . Hypertrophy of prostate without urinary obstruction and other lower urinary tract symptoms (LUTS)   . Memory loss   . Paroxysmal SVT (supraventricular tachycardia) ([place])    a. 3/14 => converted in ED with adenosine to AFib => NSR;  b. Echo 4/14:  Mild LVH, mild FBSH, EF 55-60%, Gr 1 DD  . Prostate nodule ~2009   Left-benign s/p eval Uro WNL ([place])  . Thrombocytopenia ([place])   . Tobacco use disorder     Past Surgical History:  Procedure Laterality Date  . COLONOSCOPY  03/2009   small hemorrhoids, 2 polyps (hyperplastic and adenomatous), rpt 5 yrs (Dr. Barron Schmid) - pt declined rpt scheduling  . COLONOSCOPY  10/2014   2 hyperplastic polyp, hemmorhoids ([place])  . CORONARY ARTERY BYPASS GRAFT  1999   Dr. Roxy Manns  . ESOPHAGOGASTRODUODENOSCOPY  05/16/2011   after hematemesis, no lesions found, consistent with barrett's esophagus rpt 2 yrs  . ESOPHAGOGASTRODUODENOSCOPY  06/2013   barrett's, small HH, recheck 2-3 yrs ([place])  . HEMORRHOID SURGERY    . LUMBAR LAMINECTOMY/DECOMPRESSION MICRODISCECTOMY  01/31/2012   Procedure: LUMBAR LAMINECTOMY/DECOMPRESSION MICRODISCECTOMY 1 LEVEL;  Surgeon:  Winfield Cunas, MD;  Location: [facility];  Service: Neurosurgery;  Laterality: Left;  LEFT Lumbar Four-Five Diskectomy  . NOSE SURGERY    . ROTATOR CUFF REPAIR  04/2010   right (but has bilat) Dr. Latanya Maudlin ([facility])    Soc Hx - widowed 2018. He has 1 son, 1 daughter. Son lives nearby and is Primary care giver. Patient lives alone, but has limited activity. He served 14 years [employer]. He is a retired Solicitor x 21 years.    reports that he has been smoking cigarettes. He  started smoking about 68 years ago. He has a 60.00 pack-year smoking history. His smokeless tobacco use includes snuff. He reports current alcohol use. He reports that he does not use drugs.  Allergies  Allergen Reactions  . Codeine     REACTION: vomiting  . Cyclobenzaprine Hcl     REACTION: vomiting  . Hydrocodone Nausea Only and Other (See Comments)    "crazy"  . Tramadol Other (See Comments)    woozy  . Valium Other (See Comments)    sedation    Family History  Problem Relation Age of Onset  . Cancer Father 66       colon  . Memory loss Mother      Prior to Admission medications   Medication Sig Start Date End Date Taking? Authorizing Provider  albuterol (VENTOLIN HFA) 108 (90 Base) MCG/ACT inhaler Inhale 2 puffs into the lungs every 6 (six) hours as needed for wheezing or shortness of breath. 08/06/19  Yes [name], MD  aspirin 81 MG tablet Take 81 mg by mouth daily.   Yes [provider]  BREO ELLIPTA 100-25 MCG/INH AEPB USE 1 INHALATION BY MOUTH  INTO THE LUNGS DAILY Patient taking differently: Inhale 1 puff into the lungs daily. 05/23/20  Yes [name], MD  donepezil (ARICEPT) 10 MG tablet Take 1 tablet (10 mg total) by mouth at bedtime. 04/10/20  Yes Marcial Pacas, MD  isosorbide mononitrate (IMDUR) 30 MG 24 hr tablet Take 1 tablet (30 mg total) by mouth daily. 01/24/20  Yes [name], MD  magnesium hydroxide (MILK OF MAGNESIA) 400 MG/5ML suspension Take 15 mLs by mouth daily as needed for moderate constipation. 11/01/14  Yes [name], MD  memantine (NAMENDA) 10 MG tablet Take 1 tablet (10 mg total) by mouth 2 (two) times daily. 04/10/20  Yes Marcial Pacas, MD  metFORMIN (GLUCOPHAGE) 500 MG tablet Take 1 tablet (500 mg total) by mouth daily with breakfast. 01/24/20  Yes [name], MD  metoprolol tartrate (LOPRESSOR) 25 MG tablet TAKE ONE-HALF TABLET BY  MOUTH TWICE DAILY Patient taking differently: Take 12.5-25 mg by mouth See admin  instructions. Taking 25mg  in the AM and 12.5mg  in the PM 09/01/20  Yes [name], MD  omeprazole (PRILOSEC) 40 MG capsule TAKE 1 CAPSULE BY MOUTH  DAILY Patient taking differently: Take 40 mg by mouth daily. 09/01/20  Yes [name], MD  sertraline (ZOLOFT) 50 MG tablet TAKE 1 TABLET BY MOUTH  DAILY Patient taking differently: Take 50 mg by mouth daily. 08/18/20  Yes [name], MD  simvastatin (ZOCOR) 40 MG tablet TAKE 1 TABLET BY MOUTH AT  BEDTIME Patient taking differently: Take 40 mg by mouth at bedtime. 08/18/20  Yes [name], MD  vitamin B-12 (CYANOCOBALAMIN) 500 MCG tablet Take 1 tablet (500 mcg total) by mouth every Monday, Wednesday, and Friday. Patient taking differently: Take 500 mcg by mouth daily. 01/28/20  Yes [name],  MD    Physical Exam: Vitals:   09/10/20 1331 09/10/20 1332 09/10/20 1345 09/10/20 1430  BP: 139/78  (!) 141/73 140/61  Pulse: 85  87 81  Resp: (!) 24  (!) 26 (!) 26  Temp: 98.6 F (37 C)     TempSrc: Oral     SpO2: 100%  98% 99%  Weight:  70.3 kg    Height:  5\' 8"  (1.727 m)       Vitals:   09/10/20 1331 09/10/20 1332 09/10/20 1345 09/10/20 1430  BP: 139/78  (!) 141/73 140/61  Pulse: 85  87 81  Resp: (!) 24  (!) 26 (!) 26  Temp: 98.6 F (37 C)     TempSrc: Oral     SpO2: 100%  98% 99%  Weight:  70.3 kg    Height:  5\' 8"  (1.727 m)     General - Elderly man on O2 in no acute distress Eyes: PERRL, lids and conjunctivae normal ENMT: Mucous membranes are dry. Posterior pharynx clear of any exudate or lesions. Full upper denture, partial lower denture.  Neck: normal, supple, no masses, no thyromegaly Respiratory. Normal respiratory effort. No accessory muscle use. Decreased breath sounds at bases and positive egophony R>L, no wheezing, coarse rales at bases. Cardiovascular: Regular rate and rhythm, no murmurs / rubs / gallops. No extremity edema. 2+ pedal pulses. No carotid bruits.  Abdomen: no tenderness, no  masses palpated. No hepatosplenomegaly. Bowel sounds positive.  Musculoskeletal: no clubbing / cyanosis. No joint deformity upper and lower extremities. Good ROM, no contractures. Normal muscle tone.  Skin: no rashes, lesions, ulcers. No induration. Bruised right hand and thumb.  Neurologic: CN 2-12 grossly intac. Strength 5/5 in all 4.  Psychiatric: Impaired judgment and poor insight to medical condition. Alert and oriented to person, family. Calm mood.     Labs on Admission: I have personally reviewed following labs and imaging studies  CBC: Recent Labs  Lab 09/10/20 1340 09/10/20 1354  WBC 9.1  --   NEUTROABS 8.0*  --   HGB 14.0 14.3  HCT 42.7 42.0  MCV 96.0  --   PLT 102*  --    Basic Metabolic Panel: Recent Labs  Lab 09/10/20 1340 09/10/20 1354  NA 141 143  K 5.3* 5.1  CL 109  --   CO2 13*  --   GLUCOSE 183*  --   BUN 58*  --   CREATININE 2.63*  --   CALCIUM 9.2  --    GFR: Estimated Creatinine Clearance: 19.9 mL/min (A) (by C-G formula based on SCr of 2.63 mg/dL (H)). Liver Function Tests: Recent Labs  Lab 09/10/20 1340  AST 65*  ALT 41  ALKPHOS 120  BILITOT 0.8  PROT 8.3*  ALBUMIN 4.2   No results for input(s): LIPASE, AMYLASE in the last 168 hours. No results for input(s): AMMONIA in the last 168 hours. Coagulation Profile: No results for input(s): INR, PROTIME in the last 168 hours. Cardiac Enzymes: No results for input(s): CKTOTAL, CKMB, CKMBINDEX, TROPONINI in the last 168 hours. BNP (last 3 results) No results for input(s): PROBNP in the last 8760 hours. HbA1C: No results for input(s): HGBA1C in the last 72 hours. CBG: No results for input(s): GLUCAP in the last 168 hours. Lipid Profile: No results for input(s): CHOL, HDL, LDLCALC, TRIG, CHOLHDL, LDLDIRECT in the last 72 hours. Thyroid Function Tests: No results for input(s): TSH, T4TOTAL, FREET4, T3FREE, THYROIDAB in the last 72 hours. Anemia Panel: No results for  input(s): VITAMINB12,  FOLATE, FERRITIN, TIBC, IRON, RETICCTPCT in the last 72 hours. Urine analysis:    Component Value Date/Time   COLORURINE YELLOW 04/26/2018 1028   APPEARANCEUR CLEAR 04/26/2018 1028   LABSPEC 1.011 04/26/2018 1028   PHURINE 6.0 04/26/2018 1028   GLUCOSEU NEGATIVE 04/26/2018 1028   HGBUR NEGATIVE 04/26/2018 [place] 04/26/2018 1028   BILIRUBINUR Negative 03/28/2014 1142   KETONESUR 5 (A) 04/26/2018 1028   PROTEINUR NEGATIVE 04/26/2018 1028   UROBILINOGEN 0.2 03/28/2014 1142   UROBILINOGEN 0.2 11/23/2012 1837   NITRITE NEGATIVE 04/26/2018 1028   LEUKOCYTESUR NEGATIVE 04/26/2018 1028    Radiological Exams on Admission: DG Chest 1 View  Result Date: 09/10/2020 CLINICAL DATA:  Shortness of breath EXAM: CHEST  1 VIEW COMPARISON:  06/30/2018 FINDINGS: Cardiac shadow is within normal limits. Postsurgical changes are seen. The lungs are hyperinflated. No focal infiltrate or sizable effusion is seen. No bony abnormality is noted. IMPRESSION: COPD without acute abnormality. Electronically Signed   By: Inez Catalina [name]   On: 09/10/2020 13:45   CT Head Wo Contrast  Result Date: 09/10/2020 CLINICAL DATA:  Recent head trauma, initial encounter EXAM: CT HEAD WITHOUT CONTRAST TECHNIQUE: Contiguous axial images were obtained from the base of the skull through the vertex without intravenous contrast. COMPARISON:  None. FINDINGS: Brain: No evidence of acute infarction, hemorrhage, hydrocephalus, extra-axial collection or mass lesion/mass effect. Chronic atrophic and ischemic changes are noted. Vascular: No hyperdense vessel or unexpected calcification. Skull: Normal. Negative for fracture or focal lesion. Sinuses/Orbits: No acute finding. Other: None. IMPRESSION: Chronic atrophic and ischemic changes without acute abnormality. Electronically Signed   By: Inez Catalina [name]   On: 09/10/2020 14:28   CT Cervical Spine Wo Contrast  Result Date: 09/10/2020 CLINICAL DATA:  Recent trauma  with neck pain, initial encounter EXAM: CT CERVICAL SPINE WITHOUT CONTRAST TECHNIQUE: Multidetector CT imaging of the cervical spine was performed without intravenous contrast. Multiplanar CT image reconstructions were also generated. COMPARISON:  None. FINDINGS: Alignment: Within normal limits. Skull base and vertebrae: 7 cervical segments are well visualized. Vertebral body height is well maintained. Multilevel osteophytic changes and facet hypertrophic changes are seen. No acute fracture or acute facet abnormality is noted. The odontoid is within normal limits. Soft tissues and spinal [name]: Surrounding soft tissue structures are within normal limits with the exception of vascular calcifications. Upper chest: Pleural and parenchymal scarring is noted in the apices bilaterally. Other: None IMPRESSION: Multilevel degenerative change without acute abnormality. Electronically Signed   By: Inez Catalina [name]   On: 09/10/2020 14:32    EKG: Independently reviewed. Pending at time of admission  Assessment/Plan Active Problems:   Severe hypoxemia   Acute respiratory disease due to COVID-19 virus   AKI (acute kidney injury) ([place])   Diabetes mellitus type 2 with complications (HCC)   COPD (chronic obstructive pulmonary disease) (HCC)   Dementia without behavioral disturbance (HCC)   Chronic diastolic CHF (congestive heart failure) (HCC)   Hyperlipidemia associated with type 2 diabetes mellitus (HCC)   Thrombocytopenia (HCC)   Tobacco use   PUD (peptic ulcer disease)   Acute respiratory failure with hypoxemia ([place])     1. Acute respiratory disease in covid POSITIVE patient. CXR w/o infiltrates but exam is abnormal and he is markedly hypoxemic.  Plan CT chest w/o contrast  Inflammatory markers: CRP, Ferritin, D-dimer ordered. Procalcitonin ordered  Active Covid tx: remdesivir, solumedrol, continued oxygen  For progressive decline or failure to improve may be candidate  for immunologics  2. COPD - no  wheezing appreciated.  Plan Continue home regimen  3. DM- Last A1C 5.8% May '21  Plan Stop metformin in face of renal insufficiency  Lantus 10u qHS - 2/2 steroid use  Sliding scale coverage  4. AKI Baseline Cr 1.47 2/2 diabetic glomerulonephropathy now to 2.64 2/2 dehydration  Plan Gentle hydration with 1/2 NS at 75cc/hr x 12 hrs then reassess.  Bmet in AM  5. HFpEF - last Echo 12/23/12 EF 0000000, grade I diastolic dysfxn. BNP 95.5  Plan 2D echo  Continue home medications  Caution rehydration.  6. Dementia - last neuro eval 04/10/20 - MMSE 16/30. No behavioral disturbance  Plan Continue home meds  7. HLD - continue home meds  8. Chronic thrombocytopenia - baseline plt ct 147 now 102.  Plan F/u CBC  9. Tobacco abuse - 60 pk/yr and continues to smoke  Plan Nicotine patch  10. PUD - no active c/o. Continue PPI  11. Code status - prior discussions noted. Son confirms DNR/DNI  DVT prophylaxis: low dose lovenox   Code Status: DNR Family Communication: Son present during interview and exam. Understands Dx with potential for poor outcome and Tx plan. Answered all questions.   Disposition Plan: TBD  Consults called: none  Admission status: inpatient    Adella Hare MD [facility] Pager [phone]  If 7PM-7AM, please contact night-coverage [email or web address] Password [facility]  09/10/2020, 4:40 PM

## 2020-09-10 NOTE — ED Notes (Signed)
Patient used [facility] with some assistance. Had large brown, formed BM. Patient provided with PO fluids and assisted with repositioning. He denies further needs.

## 2020-09-10 NOTE — ED Notes (Signed)
Back from CT

## 2020-09-10 NOTE — ED Notes (Signed)
Placed on salter high flow N/C per Verbal Order from MD. Also contacted RT

## 2020-09-10 NOTE — ED Notes (Signed)
Off floor to CT 

## 2020-09-10 NOTE — ED Triage Notes (Signed)
Pt from home via ems. Pt had unwitnessed fall in tub but was ambulatory on scene with no apparent physical injuries. Pt was 68% on room air. Ems stated that Pt had rhonchi  And fluid bilaterally. Pt has advanced stage dementia with hx of copd. EMS Bp 150/70 manuel.

## 2020-09-10 NOTE — ED Notes (Signed)
Moved to 5 L

## 2020-09-10 NOTE — ED Provider Notes (Signed)
[name] Provider Note   CSN: [id number] Arrival date & time: 09/10/20  1321     History Chief Complaint  Patient presents with  . Shortness of Breath    [name] is a 86 y.o. male.  HPI   86 year old male with past medical history of COPD, not on home oxygen, CAD status post CABG, HTN, HLD, DM presents the emergency department with reported fall and shortness of breath. Patient has history of dementia, he is pleasant but appears to be a poor historian. Son is at bedside to aid in history. He states that the patient lives home alone, normally is able to care for himself. He checks in about 2 times a day. This morning he found him sitting in the tub. Unclear of the exact mechanism. He noticed that his dad was really short of breath so he called EMS. He was reportedly hypoxic to 60% on room air, improved on nonrebreather. No reported treatments prior to arrival. Patient denies any pain complaints to me. Patient has a chronic cough with may be increased sputum production but no reports of fever, vomiting, diarrhea.  Past Medical History:  Diagnosis Date  . Aorto-iliac atherosclerosis ([place]) 08/2015   by xray  . Barrett's esophagus    EGD 2012, rpt 2014, longterm PPI  . Chronic idiopathic constipation 07/17/2010  . Chronic obstructive pulmonary disease (COPD) (H[facility])    spirometry with moderate COPD  . Coronary atherosclerosis of artery bypass graft   . Diabetes mellitus type II ~2008  . Gastrointestinal ulcer 04/2011   with hematemesis s/p EGD by [facility]  . GERD (gastroesophageal reflux disease)   . Hearing loss   . HLD (hyperlipidemia)   . HNP (herniated nucleus pulposus), lumbar 2013   with compression of L5 nerve root and foot drop, also with lumbar DDD s/p surgery  . Hypertrophy of prostate without urinary obstruction and other lower urinary tract symptoms (LUTS)   . Memory loss   . Paroxysmal SVT (supraventricular tachycardia)  ([place])    a. 3/14 => converted in ED with adenosine to AFib => NSR;  b. Echo 4/14:  Mild LVH, mild FBSH, EF 55-60%, Gr 1 DD  . Prostate nodule ~2009   Left-benign s/p eval Uro WNL ([place])  . Thrombocytopenia ([place])   . Tobacco use disorder     Patient Active Problem List   Diagnosis Date Noted  . Cerebral vascular disease 04/10/2020  . Diabetic cataract of both eyes ([place]) 01/24/2020  . Vitamin B12 deficiency 08/03/2019  . History of stroke 08/03/2019  . Pseudodementia 10/01/2018  . Sensorineural hearing loss (SNHL) of both ears 12/31/2017  . Candidal intertrigo 12/22/2017  . Left serous otitis media 07/04/2017  . Hypertension associated with diabetes ([place]) 05/22/2017  . Eustachian tube dysfunction, left 04/04/2017  . Dry skin dermatitis 12/02/2016  . Health maintenance examination 10/08/2016  . Advanced care planning/counseling discussion 10/08/2016  . Thyromegaly 10/08/2016  . Anemia 10/08/2016  . Aorto-iliac atherosclerosis ([place]) 08/17/2015  . Bleeding hemorrhoid 06/13/2015  . Dementia without behavioral disturbance ([place]) 06/13/2015  . Orthostatic dizziness 08/01/2014  . Paresthesia of foot, bilateral 08/01/2014  . Supraventricular tachycardia ([place]) 11/24/2012  . CAD (coronary artery disease) 11/24/2012  . COPD exacerbation ([place]) 11/10/2012  . Lumbosacral radiculopathy at L5 12/23/2011  . Thrombocytopenia ([place])   . Barrett's esophagus   . Skin lesion of back 09/16/2011  . PUD (peptic ulcer disease) 05/16/2011  . Medicare annual wellness visit, subsequent 04/30/2011  . COPD (  chronic obstructive pulmonary disease) ([place]) 12/26/2010  . Diabetes mellitus type 2 with complications ([place]) 99991111  . Chronic idiopathic constipation 07/17/2010  . Hyperlipidemia associated with type 2 diabetes mellitus ([place]) 05/13/2009  . Tobacco use 05/13/2009    Past Surgical History:  Procedure Laterality Date  . COLONOSCOPY  03/2009   small hemorrhoids, 2 polyps (hyperplastic and  adenomatous), rpt 5 yrs (Dr. Barron Schmid) - pt declined rpt scheduling  . COLONOSCOPY  10/2014   2 hyperplastic polyp, hemmorhoids ([place])  . CORONARY ARTERY BYPASS GRAFT  1999   Dr. Roxy Manns  . ESOPHAGOGASTRODUODENOSCOPY  05/16/2011   after hematemesis, no lesions found, consistent with barrett's esophagus rpt 2 yrs  . ESOPHAGOGASTRODUODENOSCOPY  06/2013   barrett's, small HH, recheck 2-3 yrs ([place])  . HEMORRHOID SURGERY    . LUMBAR LAMINECTOMY/DECOMPRESSION MICRODISCECTOMY  01/31/2012   Procedure: LUMBAR LAMINECTOMY/DECOMPRESSION MICRODISCECTOMY 1 LEVEL;  Surgeon: Winfield Cunas, MD;  Location: [facility];  Service: Neurosurgery;  Laterality: Left;  LEFT Lumbar Four-Five Diskectomy  . NOSE SURGERY    . ROTATOR CUFF REPAIR  04/2010   right (but has bilat) Dr. Latanya Maudlin ([facility])       Family History  Problem Relation Age of Onset  . Cancer Father 27       colon  . Memory loss Mother     Social History   Tobacco Use  . Smoking status: Current Every Day Smoker    Packs/day: 1.00    Years: 60.00    Pack years: 60.00    Types: Cigarettes    Start date: 09/16/1952  . Smokeless tobacco: Current User    Types: Snuff  . Tobacco comment: < 1 ppd; does dip sometimes  Substance Use Topics  . Alcohol use: Yes  . Drug use: No    Home Medications Prior to Admission medications   Medication Sig Start Date End Date Taking? Authorizing Provider  albuterol (VENTOLIN HFA) 108 (90 Base) MCG/ACT inhaler Inhale 2 puffs into the lungs every 6 (six) hours as needed for wheezing or shortness of breath. 08/06/19  Yes [name], MD  aspirin 81 MG tablet Take 81 mg by mouth daily.   Yes [provider]  BREO ELLIPTA 100-25 MCG/INH AEPB USE 1 INHALATION BY MOUTH  INTO THE LUNGS DAILY Patient taking differently: Inhale 1 puff into the lungs daily. 05/23/20  Yes [name], MD  donepezil (ARICEPT) 10 MG tablet Take 1 tablet (10 mg total) by mouth at bedtime. 04/10/20  Yes Marcial Pacas, MD  isosorbide mononitrate (IMDUR) 30 MG 24 hr tablet Take 1 tablet (30 mg total) by mouth daily. 01/24/20  Yes [name], MD  magnesium hydroxide (MILK OF MAGNESIA) 400 MG/5ML suspension Take 15 mLs by mouth daily as needed for moderate constipation. 11/01/14  Yes [name], MD  memantine (NAMENDA) 10 MG tablet Take 1 tablet (10 mg total) by mouth 2 (two) times daily. 04/10/20  Yes Marcial Pacas, MD  metFORMIN (GLUCOPHAGE) 500 MG tablet Take 1 tablet (500 mg total) by mouth daily with breakfast. 01/24/20  Yes [name], MD  metoprolol tartrate (LOPRESSOR) 25 MG tablet TAKE ONE-HALF TABLET BY  MOUTH TWICE DAILY Patient taking differently: Take 12.5-25 mg by mouth See admin instructions. Taking 25mg  in the AM and 12.5mg  in the PM 09/01/20  Yes [name], MD  omeprazole (PRILOSEC) 40 MG capsule TAKE 1 CAPSULE BY MOUTH  DAILY Patient taking differently: Take 40 mg by mouth daily. 09/01/20  Yes [name], MD  sertraline (ZOLOFT) 50 MG tablet TAKE 1 TABLET BY MOUTH  DAILY Patient taking differently: Take 50 mg by mouth daily. 08/18/20  Yes [name], MD  simvastatin (ZOCOR) 40 MG tablet TAKE 1 TABLET BY MOUTH AT  BEDTIME Patient taking differently: Take 40 mg by mouth at bedtime. 08/18/20  Yes [name], MD  vitamin B-12 (CYANOCOBALAMIN) 500 MCG tablet Take 1 tablet (500 mcg total) by mouth every Monday, Wednesday, and Friday. Patient taking differently: Take 500 mcg by mouth daily. 01/28/20  Yes [name], MD    Allergies    Codeine, Cyclobenzaprine hcl, Hydrocodone, Tramadol, and Valium  Review of Systems   Review of Systems  Unable to perform ROS: Dementia    Physical Exam Updated Vital Signs BP 139/78 (BP Location: Right Arm)   Pulse 85   Temp 98.6 F (37 C) (Oral)   Resp (!) 24   Ht 5\' 8"  (1.727 m)   Wt 70.3 kg   SpO2 100%   BMI 23.57 kg/m   Physical Exam Vitals and nursing note reviewed.  Constitutional:       General: He is not in acute distress.    Appearance: Normal appearance. He is ill-appearing.  HENT:     Head: Normocephalic and atraumatic.     Comments: No hematoma    Mouth/Throat:     Mouth: Mucous membranes are moist.  Eyes:     Pupils: Pupils are equal, round, and reactive to light.  Neck:     Comments: No midline spinal TTP Cardiovascular:     Rate and Rhythm: Normal rate.  Pulmonary:     Effort: Pulmonary effort is normal. No respiratory distress.     Breath sounds: Examination of the right-upper field reveals decreased breath sounds. Examination of the left-upper field reveals decreased breath sounds. Decreased breath sounds, wheezing, rhonchi and rales present.  Chest:     Chest wall: No tenderness or crepitus.  Abdominal:     Palpations: Abdomen is soft.     Tenderness: There is no abdominal tenderness. There is no guarding.  Musculoskeletal:     Right lower leg: No edema.     Left lower leg: No edema.  Skin:    General: Skin is warm.     Comments: Scattered abrasions and scabs  Neurological:     Mental Status: He is alert. Mental status is at baseline.     Comments: Reported to be baseline by the son  Psychiatric:        Mood and Affect: Mood normal.     ED Results / Procedures / Treatments   Labs (all labs ordered are listed, but only abnormal results are displayed) Labs Reviewed  I-STAT VENOUS BLOOD GAS, ED - Abnormal; Notable for the following components:      Result Value   pH, Ven 7.201 (*)    pCO2, Ven 40.2 (*)    Bicarbonate 15.8 (*)    TCO2 17 (*)    Acid-base deficit 12.0 (*)    Calcium, Ion 1.13 (*)    All other components within normal limits  RESP PANEL BY RT-PCR (FLU A&B, COVID) ARPGX2  CBC WITH DIFFERENTIAL/PLATELET  COMPREHENSIVE METABOLIC PANEL  BRAIN NATRIURETIC PEPTIDE  TROPONIN I (HIGH SENSITIVITY)    EKG None  Radiology DG Chest 1 View  Result Date: 09/10/2020 CLINICAL DATA:  Shortness of breath EXAM: CHEST  1 VIEW  COMPARISON:  06/30/2018 FINDINGS: Cardiac shadow is within normal limits. Postsurgical changes are seen. The lungs are hyperinflated. No  focal infiltrate or sizable effusion is seen. No bony abnormality is noted. IMPRESSION: COPD without acute abnormality. Electronically Signed   By: Inez Catalina [name]   On: 09/10/2020 13:45    Procedures .Critical Care Performed by: Lorelle Gibbs, DO Authorized by: Lorelle Gibbs, DO   Critical care provider statement:    Critical care time (minutes):  30   Critical care was necessary to treat or prevent imminent or life-threatening deterioration of the following conditions:  Respiratory failure and dehydration   Critical care was time spent personally by me on the following activities:  Discussions with consultants, development of treatment plan with patient or surrogate, ordering and performing treatments and interventions, ordering and review of laboratory studies, ordering and review of radiographic studies, re-evaluation of patient's condition, examination of patient and evaluation of patient's response to treatment   I assumed direction of critical care for this patient from another provider in my specialty: no     (including critical care time)  Medications Ordered in ED Medications  methylPREDNISolone sodium succinate (SOLU-MEDROL) 125 mg/2 mL injection 125 mg (has no administration in time range)  albuterol (VENTOLIN HFA) 108 (90 Base) MCG/ACT inhaler 4 puff (has no administration in time range)    ED Course  I have reviewed the triage vital signs and the nursing notes.  Pertinent labs & imaging results that were available during my care of the patient were reviewed by me and considered in my medical decision making (see chart for details).    MDM Rules/Calculators/A&P                          86 year old male presents the emergency department with reported fall and shortness of breath as well as hypoxia.  He arrives on a nonrebreather,  was transitioned to high flow.  He has decreased breath sounds in the upper lung fields with rales/rhonchi in the lower fields and scattered wheezes.  He is afebrile, maintaining his oxygen saturation on the high flow.  Chest x-ray shows findings of COPD without any other acute abnormality.  We will treat the patient for a COPD exacerbation, evaluate for fall and head trauma and plan to admit due to hypoxia.  Son is at bedside and states that he and the patient have agreed that he is DNR/DNI.  Lab called and patient was reportedly Covid positive.  Blood work shows dehydration, first troponin is 45, BNP is normal, no leukocytosis.  Potassium is 5.3, awaiting EKG for evaluation.  Continues to be stable on high flow nasal cannula.  We will consult pharmacy for remdesivir.  Patient admitted to hospitalist.  Final Clinical Impression(s) / ED Diagnoses Final diagnoses:  None    Rx / DC Orders ED Discharge Orders    None       Lorelle Gibbs, DO 09/10/20 1530

## 2020-09-11 ENCOUNTER — Inpatient Hospital Stay (HOSPITAL_COMMUNITY): Payer: Medicare Other

## 2020-09-11 DIAGNOSIS — J9601 Acute respiratory failure with hypoxia: Secondary | ICD-10-CM | POA: Diagnosis not present

## 2020-09-11 DIAGNOSIS — Z720 Tobacco use: Secondary | ICD-10-CM

## 2020-09-11 DIAGNOSIS — J431 Panlobular emphysema: Secondary | ICD-10-CM

## 2020-09-11 DIAGNOSIS — E785 Hyperlipidemia, unspecified: Secondary | ICD-10-CM

## 2020-09-11 DIAGNOSIS — N179 Acute kidney failure, unspecified: Secondary | ICD-10-CM | POA: Diagnosis not present

## 2020-09-11 DIAGNOSIS — E1169 Type 2 diabetes mellitus with other specified complication: Secondary | ICD-10-CM

## 2020-09-11 DIAGNOSIS — I5032 Chronic diastolic (congestive) heart failure: Secondary | ICD-10-CM

## 2020-09-11 DIAGNOSIS — R0902 Hypoxemia: Secondary | ICD-10-CM | POA: Diagnosis not present

## 2020-09-11 DIAGNOSIS — U071 COVID-19: Secondary | ICD-10-CM | POA: Diagnosis not present

## 2020-09-11 DIAGNOSIS — D696 Thrombocytopenia, unspecified: Secondary | ICD-10-CM

## 2020-09-11 LAB — BASIC METABOLIC PANEL
Anion gap: 11 (ref 5–15)
BUN: 62 mg/dL — ABNORMAL HIGH (ref 8–23)
CO2: 16 mmol/L — ABNORMAL LOW (ref 22–32)
Calcium: 8.3 mg/dL — ABNORMAL LOW (ref 8.9–10.3)
Chloride: 114 mmol/L — ABNORMAL HIGH (ref 98–111)
Creatinine, Ser: 2.17 mg/dL — ABNORMAL HIGH (ref 0.61–1.24)
GFR, Estimated: 29 mL/min — ABNORMAL LOW (ref >60)
Glucose, Bld: 124 mg/dL — ABNORMAL HIGH (ref 70–99)
Potassium: 4.7 mmol/L (ref 3.5–5.1)
Sodium: 141 mmol/L (ref 135–145)

## 2020-09-11 LAB — COMPREHENSIVE METABOLIC PANEL
ALT: 35 U/L (ref 0–44)
AST: 101 U/L — ABNORMAL HIGH (ref 15–41)
Albumin: 3.2 g/dL — ABNORMAL LOW (ref 3.5–5.0)
Alkaline Phosphatase: 86 U/L (ref 38–126)
Anion gap: 11 (ref 5–15)
BUN: 66 mg/dL — ABNORMAL HIGH (ref 8–23)
CO2: 15 mmol/L — ABNORMAL LOW (ref 22–32)
Calcium: 8.2 mg/dL — ABNORMAL LOW (ref 8.9–10.3)
Chloride: 109 mmol/L (ref 98–111)
Creatinine, Ser: 2.41 mg/dL — ABNORMAL HIGH (ref 0.61–1.24)
GFR, Estimated: 26 mL/min — ABNORMAL LOW (ref >60)
Glucose, Bld: 130 mg/dL — ABNORMAL HIGH (ref 70–99)
Potassium: 5.7 mmol/L — ABNORMAL HIGH (ref 3.5–5.1)
Sodium: 135 mmol/L (ref 135–145)
Total Bilirubin: 1.2 mg/dL (ref 0.3–1.2)
Total Protein: 6.4 g/dL — ABNORMAL LOW (ref 6.5–8.1)

## 2020-09-11 LAB — ECHOCARDIOGRAM COMPLETE
Area-P 1/2: 2.2 cm2
Calc EF: 73.9 %
Height: 68 in
S' Lateral: 2.63 cm
Single Plane A2C EF: 67.9 %
Single Plane A4C EF: 77.3 %
Weight: 2480 oz

## 2020-09-11 LAB — CBG MONITORING, ED
Glucose-Capillary: 138 mg/dL — ABNORMAL HIGH (ref 70–99)
Glucose-Capillary: 113 mg/dL — ABNORMAL HIGH (ref 70–99)
Glucose-Capillary: 125 mg/dL — ABNORMAL HIGH (ref 70–99)
Glucose-Capillary: 116 mg/dL — ABNORMAL HIGH (ref 70–99)
Glucose-Capillary: 117 mg/dL — ABNORMAL HIGH (ref 70–99)

## 2020-09-11 LAB — C-REACTIVE PROTEIN: CRP: 8.8 mg/dL — ABNORMAL HIGH (ref <1.0)

## 2020-09-11 LAB — PROCALCITONIN: Procalcitonin: 4.51 ng/mL

## 2020-09-11 LAB — CBC WITH DIFFERENTIAL/PLATELET
Abs Immature Granulocytes: 0.05 10*3/uL (ref 0.00–0.07)
Basophils Absolute: 0 10*3/uL (ref 0.0–0.1)
Basophils Relative: 0 %
Eosinophils Absolute: 0 10*3/uL (ref 0.0–0.5)
Eosinophils Relative: 0 %
HCT: 34.7 % — ABNORMAL LOW (ref 39.0–52.0)
Hemoglobin: 11.9 g/dL — ABNORMAL LOW (ref 13.0–17.0)
Immature Granulocytes: 1 %
Lymphocytes Relative: 9 %
Lymphs Abs: 0.9 10*3/uL (ref 0.7–4.0)
MCH: 31.7 pg (ref 26.0–34.0)
MCHC: 34.3 g/dL (ref 30.0–36.0)
MCV: 92.5 fL (ref 80.0–100.0)
Monocytes Absolute: 0.3 10*3/uL (ref 0.1–1.0)
Monocytes Relative: 3 %
Neutro Abs: 8.9 10*3/uL — ABNORMAL HIGH (ref 1.7–7.7)
Neutrophils Relative %: 87 %
Platelets: 82 10*3/uL — ABNORMAL LOW (ref 150–400)
RBC: 3.75 MIL/uL — ABNORMAL LOW (ref 4.22–5.81)
RDW: 14.5 % (ref 11.5–15.5)
WBC: 10.2 10*3/uL (ref 4.0–10.5)
nRBC: 0 % (ref 0.0–0.2)

## 2020-09-11 LAB — D-DIMER, QUANTITATIVE: D-Dimer, Quant: 4.12 ug/mL-FEU — ABNORMAL HIGH (ref 0.00–0.50)

## 2020-09-11 LAB — HEMOGLOBIN A1C
Hgb A1c MFr Bld: 6.1 % — ABNORMAL HIGH (ref 4.8–5.6)
Mean Plasma Glucose: 128.37 mg/dL

## 2020-09-11 LAB — PHOSPHORUS: Phosphorus: 4.1 mg/dL (ref 2.5–4.6)

## 2020-09-11 LAB — MAGNESIUM: Magnesium: 2.1 mg/dL (ref 1.7–2.4)

## 2020-09-11 LAB — TROPONIN I (HIGH SENSITIVITY)
Troponin I (High Sensitivity): 68 ng/L — ABNORMAL HIGH (ref <18)
Troponin I (High Sensitivity): 78 ng/L — ABNORMAL HIGH (ref <18)

## 2020-09-11 LAB — FERRITIN
Ferritin: 184 ng/mL (ref 24–336)
Ferritin: 196 ng/mL (ref 24–336)

## 2020-09-11 LAB — LACTATE DEHYDROGENASE: LDH: 283 U/L — ABNORMAL HIGH (ref 98–192)

## 2020-09-11 MED ORDER — ZINC SULFATE 220 (50 ZN) MG PO CAPS
220.0000 mg | ORAL_CAPSULE | Freq: Every day | ORAL | Status: DC
  Administered 2020-09-11 – 2020-09-19 (×9): 220 mg via ORAL
  Filled 2020-09-11 (×9): qty 1

## 2020-09-11 MED ORDER — DEXTROSE-NACL 5-0.9 % IV SOLN: INTRAVENOUS | Status: DC

## 2020-09-11 MED ORDER — IPRATROPIUM-ALBUTEROL 20-100 MCG/ACT IN AERS
1.0000 | INHALATION_SPRAY | Freq: Four times a day (QID) | RESPIRATORY_TRACT | Status: DC
  Administered 2020-09-11 (×2): 1 via RESPIRATORY_TRACT
  Filled 2020-09-11: qty 4

## 2020-09-11 MED ORDER — GUAIFENESIN-DM 100-10 MG/5ML PO SYRP
10.0000 mL | ORAL_SOLUTION | ORAL | Status: DC | PRN
  Administered 2020-09-11: 15:00:00 10 mL via ORAL
  Filled 2020-09-11: qty 10

## 2020-09-11 MED ORDER — ASCORBIC ACID 500 MG PO TABS
500.0000 mg | ORAL_TABLET | Freq: Every day | ORAL | Status: DC
  Administered 2020-09-12 – 2020-09-19 (×8): 500 mg via ORAL
  Filled 2020-09-11 (×9): qty 1

## 2020-09-11 MED ORDER — METHYLPREDNISOLONE SODIUM SUCC 125 MG IJ SOLR
60.0000 mg | INTRAMUSCULAR | Status: DC
  Administered 2020-09-11 – 2020-09-14 (×4): 60 mg via INTRAVENOUS
  Filled 2020-09-11 (×4): qty 2

## 2020-09-11 NOTE — ED Notes (Signed)
Call the son [name] at [phone] with an update

## 2020-09-11 NOTE — ED Notes (Signed)
Pt moved to hospital bed.  Daughter updated and she was able to speak with pt.

## 2020-09-11 NOTE — ED Notes (Signed)
Pt given incentive spirometer.  Able to use without difficulty.  Pt refusing to eat.

## 2020-09-11 NOTE — Progress Notes (Signed)
PROGRESS NOTE    Troy [name]  [id number] DOB: Jul 12, 1933 DOA: 09/10/2020 PCP: [name], MD     Brief Narrative:  86 y.o. WM PMHx Dementia, Tobacco abuse, COPD, not on home oxygen, CAD status post CABG, HTN, HLD, DM type II controlled with hyperglycemia   Presents the emergency department with reported fall and shortness of breath. Patient has history of dementia, he is pleasant but appears to be a poor historian. Son is at bedside to aid in history. He states that the patient lives home alone, normally is able to care for himself. He checks in about 2 times a day. This morning he found him sitting in the tub. Unclear of the exact mechanism. He noticed that his dad was really short of breath so he called EMS. He was reportedly hypoxic to 60% on room air, improved on nonrebreather. No reported treatments prior to arrival. Patient denies any pain complaints to me.Patient has a chronic cough with may be increased sputum production but no reports of fever, vomiting, diarrhea.   ED Course: T 98.6 140/61  HR 81 RR 26 O2 sat on hiflo 99%, VBG @ presentation 7.2/40.2/33. Cmet notable for glucose 183 BUN 58, Cr 2.64, WBC 9.1, Hgb 14, Plts 102 diff 88/6/6. Reported to be covid POSITIVE. CXR - COPD, NAD. Patient was administered solumedrol 125 mg IV, Remedesivir ordered. TRH called to admit patient with respiratory failure.    Subjective: A/O x2 (does not know when, why ), does not know if he was vaccinated for Covid or not.   Assessment & Plan: Covid vaccination;   Active Problems:   Diabetes mellitus type 2 with complications ([name])   Hyperlipidemia associated with type 2 diabetes mellitus ([name])   Tobacco use   COPD (chronic obstructive pulmonary disease) (HCC)   PUD (peptic ulcer disease)   Thrombocytopenia (HCC)   Dementia without behavioral disturbance (HCC)   Severe hypoxemia   Acute respiratory disease due to COVID-19 virus   Acute respiratory failure with hypoxemia  (HCC)   AKI (acute kidney injury) (HCC)   Chronic diastolic CHF (congestive heart failure) (HCC)   Acute respiratory failure with hypoxia/Covid Pneumonia COVID-19 Labs  Recent Labs    09/11/20 0508 09/11/20 0913  DDIMER  --  4.12*  FERRITIN 184 196  LDH  --  283*  CRP 8.8*  --     Lab Results  Component Value Date   SARSCOV2NAA POSITIVE (A) 09/10/2020  -Breo Ellipta 100-25-25 mcg per INH daily -Solu-Medrol 60 mg BID -Remdesivir pharmacy protocol -Vitamins per Covid protocol -Incentive spirometry -Flutter valve -Guaifenesin  COPD -See Covid pneumonia  Chronic Diastolic CHF -[id number] echocardiogram; not consistent with diastolic CHF -We will monitor for fluid overload -Strict in and out -Daily weight  DM type II controlled with complication/DM nephropathy -May/2021 Hemoglobin A1c= 5.8 -Lantus 10 units qhs -Resistant SSI  HLD   AKI?  (Baseline Cr 1.47 on 07/26/2019)  Lab Results  Component Value Date   CREATININE 2.17 (H) 09/11/2020   CREATININE 2.41 (H) 09/11/2020   CREATININE 2.63 (H) 09/10/2020   CREATININE 1.47 07/26/2019   CREATININE 1.26 06/30/2018  -Gentle hydration D5-0.9% saline 32ml/hr  Dementia -Per EMR last neuro eval 04/10/20 - MMSE 16/30. No behavioral disturbance -Per EMR patient lives alone, no relatives present to give baseline.   Chronic thrombocytopenia (baseline Plt ct147) -Monitor closely -Currently no signs or symptoms of acute bleed  Tobacco abuse -60 pk/yr and continues to smoke -Nicotine patch  PUD -No active c/o. Continue  PPI     DVT prophylaxis: Lovenox Code Status: DNR Son confirms DNR/DNI Family Communication:  Status is: Inpatient    Dispo: The patient is from: Home              Anticipated d/c is to: Home              Anticipated d/c date is: 1/5              Patient currently unstable      Consultants:    Procedures/Significant Events:  12/27 Echocardiogram;LVEF=55 to 60%.  -Left ventricular  diastolic parameters are indeterminate.      I have personally reviewed and interpreted all radiology studies and my findings are as above.  VENTILATOR SETTINGS: HFNC 12/27 Flow 4 L/min SPO2 91%   Cultures  Antimicrobials: Anti-infectives (From admission, onward)   Start     Dose/Rate Route Frequency Ordered Stop   09/11/20 1000  remdesivir 100 mg in sodium chloride 0.9 % 100 mL IVPB       "Followed by" Linked Group Details   100 mg 200 mL/hr over 30 Minutes Intravenous Daily 09/10/20 1533 09/15/20 0959   09/10/20 1600  remdesivir 200 mg in sodium chloride 0.9% 250 mL IVPB       "Followed by" Linked Group Details   200 mg 580 mL/hr over 30 Minutes Intravenous Once 09/10/20 1533 09/10/20 1819       Devices    LINES / TUBES:      Continuous Infusions: . remdesivir 100 mg in NS 100 mL       Objective: Vitals:   09/11/20 0530 09/11/20 0600 09/11/20 0615 09/11/20 0645  BP: 134/79 124/73 135/69 135/71  Pulse: 76 77 78 77  Resp: (!) 25 (!) 28 (!) 25 (!) 24  Temp:   97.6 F (36.4 C)   TempSrc:   Axillary   SpO2: 100% 100% 100% 91%  Weight:      Height:        Intake/Output Summary (Last 24 hours) at 09/11/2020 [id number] Last data filed at 09/11/2020 [id number] Gross per 24 hour  Intake 892.65 ml  Output --  Net 892.65 ml   Filed Weights   09/10/20 1332  Weight: 70.3 kg    Examination:  General: A/O x2 (does not know when, why) positive acute respiratory distress Eyes: negative scleral hemorrhage, negative anisocoria, negative icterus ENT: Negative Runny nose, negative gingival bleeding, Neck:  Negative scars, masses, torticollis, lymphadenopathy, JVD Lungs: decreased breath sounds bilaterally without wheezes or crackles Cardiovascular: Regular rate and rhythm without murmur gallop or rub normal S1 and S2 Abdomen: negative abdominal pain, nondistended, positive soft, bowel sounds, no rebound, no ascites, no appreciable mass Extremities: No significant  cyanosis, clubbing, or edema bilateral lower extremities Skin: Negative rashes, lesions, ulcers Psychiatric:  Negative depression, negative anxiety, negative fatigue, negative mania  Central nervous system:  Cranial nerves II through XII intact, tongue/uvula midline, all extremities muscle strength 5/5, sensation intact throughout, negative dysarthria, negative expressive aphasia, negative receptive aphasia.  .     Data Reviewed: Care during the described time interval was provided by me .  I have reviewed this patient's available data, including medical history, events of note, physical examination, and all test results as part of my evaluation.  CBC: Recent Labs  Lab 09/10/20 1340 09/10/20 1354 09/11/20 0509  WBC 9.1  --  10.2  NEUTROABS 8.0*  --  8.9*  HGB 14.0 14.3 11.9*  HCT 42.7 42.0 34.7*  MCV  96.0  --  92.5  PLT 102*  --  82*   Basic Metabolic Panel: Recent Labs  Lab 09/10/20 1340 09/10/20 1354 09/11/20 0509  NA 141 143 135  K 5.3* 5.1 5.7*  CL 109  --  109  CO2 13*  --  15*  GLUCOSE 183*  --  130*  BUN 58*  --  66*  CREATININE 2.63*  --  2.41*  CALCIUM 9.2  --  8.2*   GFR: Estimated Creatinine Clearance: 21.7 mL/min (A) (by C-G formula based on SCr of 2.41 mg/dL (H)). Liver Function Tests: Recent Labs  Lab 09/10/20 1340 09/11/20 0509  AST 65* 101*  ALT 41 35  ALKPHOS 120 86  BILITOT 0.8 1.2  PROT 8.3* 6.4*  ALBUMIN 4.2 3.2*   No results for input(s): LIPASE, AMYLASE in the last 168 hours. No results for input(s): AMMONIA in the last 168 hours. Coagulation Profile: No results for input(s): INR, PROTIME in the last 168 hours. Cardiac Enzymes: No results for input(s): CKTOTAL, CKMB, CKMBINDEX, TROPONINI in the last 168 hours. BNP (last 3 results) No results for input(s): PROBNP in the last 8760 hours. HbA1C: Recent Labs    09/11/20 0510  HGBA1C 6.1*   CBG: Recent Labs  Lab 09/10/20 1717 09/11/20 0222  GLUCAP 130* 138*   Lipid  Profile: No results for input(s): CHOL, HDL, LDLCALC, TRIG, CHOLHDL, LDLDIRECT in the last 72 hours. Thyroid Function Tests: No results for input(s): TSH, T4TOTAL, FREET4, T3FREE, THYROIDAB in the last 72 hours. Anemia Panel: Recent Labs    09/11/20 0508  FERRITIN 184   Sepsis Labs: Recent Labs  Lab 09/11/20 0508  PROCALCITON 4.51    Recent Results (from the past 240 hour(s))  Resp Panel by RT-PCR (Flu A&B, Covid) Nasopharyngeal Swab     Status: Abnormal   Collection Time: 09/10/20  1:27 PM   Specimen: Nasopharyngeal Swab; Nasopharyngeal(NP) swabs in vial transport medium  Result Value Ref Range Status   SARS Coronavirus 2 by RT PCR POSITIVE (A) NEGATIVE Final    Comment: RESULT CALLED TO, READ BACK BY AND VERIFIED WITH: JOE B. [id number] (NOTE) SARS-CoV-2 target nucleic acids are DETECTED.  The SARS-CoV-2 RNA is generally detectable in upper respiratory specimens during the acute phase of infection. Positive results are indicative of the presence of the identified virus, but do not rule out bacterial infection or co-infection with other pathogens not detected by the test. Clinical correlation with patient history and other diagnostic information is necessary to determine patient infection status. The expected result is Negative.  Fact Sheet for Patients: EntrepreneurPulse.com.au  Fact Sheet for Healthcare Providers: IncredibleEmployment.be  This test is not yet approved or cleared by the [place] FDA and  has been authorized for detection and/or diagnosis of SARS-CoV-2 by FDA under an Emergency Use Authorization (EUA).  This EUA will remain in effect (meaning this test can be used) for the  duration of  the COVID-19 declaration under Section 564(b)(1) of the Act, 21 U.S.C. section 360bbb-3(b)(1), unless the authorization is terminated or revoked sooner.     Influenza A by PCR NEGATIVE NEGATIVE Final   Influenza B by PCR  NEGATIVE NEGATIVE Final    Comment: (NOTE) The Xpert Xpress SARS-CoV-2/FLU/RSV plus assay is intended as an aid in the diagnosis of influenza from Nasopharyngeal swab specimens and should not be used as a sole basis for treatment. Nasal washings and aspirates are unacceptable for Xpert Xpress SARS-CoV-2/FLU/RSV testing.  Fact Sheet for Patients: EntrepreneurPulse.com.au  Fact Sheet for Healthcare Providers: SeriousBroker.it  This test is not yet approved or cleared by the [place] FDA and has been authorized for detection and/or diagnosis of SARS-CoV-2 by FDA under an Emergency Use Authorization (EUA). This EUA will remain in effect (meaning this test can be used) for the duration of the COVID-19 declaration under Section 564(b)(1) of the Act, 21 U.S.C. section 360bbb-3(b)(1), unless the authorization is terminated or revoked.  Performed at [facility], [address] [place], [place] [address]          Radiology Studies: DG Chest 1 View  Result Date: 09/10/2020 CLINICAL DATA:  Shortness of breath EXAM: CHEST  1 VIEW COMPARISON:  06/30/2018 FINDINGS: Cardiac shadow is within normal limits. Postsurgical changes are seen. The lungs are hyperinflated. No focal infiltrate or sizable effusion is seen. No bony abnormality is noted. IMPRESSION: COPD without acute abnormality. Electronically Signed   By: Alcide Clever [name]   On: 09/10/2020 13:45   CT Head Wo Contrast  Result Date: 09/10/2020 CLINICAL DATA:  Recent head trauma, initial encounter EXAM: CT HEAD WITHOUT CONTRAST TECHNIQUE: Contiguous axial images were obtained from the base of the skull through the vertex without intravenous contrast. COMPARISON:  None. FINDINGS: Brain: No evidence of acute infarction, hemorrhage, hydrocephalus, extra-axial collection or mass lesion/mass effect. Chronic atrophic and ischemic changes are noted. Vascular: No hyperdense vessel or  unexpected calcification. Skull: Normal. Negative for fracture or focal lesion. Sinuses/Orbits: No acute finding. Other: None. IMPRESSION: Chronic atrophic and ischemic changes without acute abnormality. Electronically Signed   By: Alcide Clever [name]   On: 09/10/2020 14:28   CT CHEST WO CONTRAST  Result Date: 09/10/2020 CLINICAL DATA:  Respiratory failure.  COVID-19 positive.  Hypoxia. EXAM: CT CHEST WITHOUT CONTRAST TECHNIQUE: Multidetector CT imaging of the chest was performed following the standard protocol without IV contrast. COMPARISON:  None. FINDINGS: Cardiovascular: Heart is normal size. Patient is post median sternotomy/CABG. Mild ectasia of the ascending thoracic aorta measuring 3.7 cm. There is calcified plaque throughout the thoracic aorta. Remaining vascular structures are unremarkable. Mediastinum/Nodes: No mediastinal or hilar adenopathy. Remaining mediastinal structures are normal. Lungs/Pleura: Lungs are hyperexpanded with centrilobular emphysematous disease. Biapical pleural thickening right worse than left. There is a 1 cm nodule opacity over the posteromedial right lower lobe with subtle adjacent possible hazy nodular opacification 7-8 mm nodule over the more superior aspect of the left lower lobe. Mild linear density with focal nodularity over the right middle lobe medially. No effusion. Airways are normal. Upper Abdomen: Calcified plaque over the abdominal aorta. No acute findings Musculoskeletal: Degenerative change of the spine. IMPRESSION: 1. Emphysematous disease with biapical pleural thickening right worse than left. 1 cm nodule opacity over the posteromedial right lower lobe with subtle adjacent hazy nodular opacification. 7-8 mm nodule over the more superior aspect of the left lower lobe. Findings may be seen with atypical infection versus inflammatory process and less likely underlying neoplastic/metastatic disease. Recommend follow-up CT 6-8 weeks. 2. Emphysema and aortic  atherosclerosis. Mild dilatation of the ascending thoracic aorta measuring 3.7 cm. Recommend annual imaging followup by CTA or MRA. This recommendation follows 2010 ACCF/AHA/AATS/ACR/ASA/SCA/SCAI/SIR/STS/SVM Guidelines for the Diagnosis and Management of Patients with Thoracic Aortic Disease. Circulation.2010; 121: U045-W098. Aortic aneurysm NOS (ICD10-I71.9). Aortic Atherosclerosis (ICD10-I70.0) and Emphysema (ICD10-J43.9). Electronically Signed   By: Elberta Fortis [name]   On: 09/10/2020 18:08   CT Cervical Spine Wo Contrast  Result Date: 09/10/2020 CLINICAL DATA:  Recent trauma with neck pain, initial  encounter EXAM: CT CERVICAL SPINE WITHOUT CONTRAST TECHNIQUE: Multidetector CT imaging of the cervical spine was performed without intravenous contrast. Multiplanar CT image reconstructions were also generated. COMPARISON:  None. FINDINGS: Alignment: Within normal limits. Skull base and vertebrae: 7 cervical segments are well visualized. Vertebral body height is well maintained. Multilevel osteophytic changes and facet hypertrophic changes are seen. No acute fracture or acute facet abnormality is noted. The odontoid is within normal limits. Soft tissues and spinal canal: Surrounding soft tissue structures are within normal limits with the exception of vascular calcifications. Upper chest: Pleural and parenchymal scarring is noted in the apices bilaterally. Other: None IMPRESSION: Multilevel degenerative change without acute abnormality. Electronically Signed   By: Inez Catalina [name]   On: 09/10/2020 14:32        Scheduled Meds: . aspirin EC  81 mg Oral Daily  . donepezil  10 mg Oral QHS  . enoxaparin (LOVENOX) injection  30 mg Subcutaneous Q24H  . fluticasone furoate-vilanterol  1 puff Inhalation Daily  . insulin aspart  0-20 Units Subcutaneous TID WC  . insulin glargine  10 Units Subcutaneous QHS  . isosorbide mononitrate  30 mg Oral Daily  . memantine  10 mg Oral BID  . methylPREDNISolone  (SOLU-MEDROL) injection  0.5 mg/kg Intravenous Q12H   Followed by  . [START ON 09/14/2020] predniSONE  50 mg Oral Daily  . metoprolol tartrate  25 mg Oral q morning - 10a   And  . metoprolol tartrate  12.5 mg Oral QHS  . nicotine  14 mg Transdermal Daily  . pantoprazole  80 mg Oral Daily  . senna  1 tablet Oral BID  . sertraline  50 mg Oral Daily  . simvastatin  40 mg Oral QHS  . vitamin B-12  500 mcg Oral Daily   Continuous Infusions: . remdesivir 100 mg in NS 100 mL       LOS: 1 day    Time spent:40 min   Haywood Meinders, Geraldo Docker, MD [facility] Pager [phone]  If 7PM-7AM, please contact night-coverage [email or web address] Password [email or web address] 09/11/2020, 8:22 AM

## 2020-09-11 NOTE — ED Notes (Signed)
Pt repositioned in bed.  RR increased from 20 to 33 just from turning in the bed to change him.  Pt has no appetite, eating only apple sauce for breakfast.

## 2020-09-11 NOTE — Progress Notes (Signed)
  Echocardiogram 2D Echocardiogram has been performed.  Janalyn Harder 09/11/2020, 10:09 AM

## 2020-09-11 NOTE — ED Notes (Signed)
Son, Roe Coombs, provided with update.

## 2020-09-11 NOTE — ED Notes (Signed)
Pt cleaned of urinary incontinence. Linens changed. Brief applied. Pt repositioned.

## 2020-09-12 ENCOUNTER — Telehealth: Payer: Self-pay

## 2020-09-12 DIAGNOSIS — N179 Acute kidney failure, unspecified: Secondary | ICD-10-CM | POA: Diagnosis not present

## 2020-09-12 DIAGNOSIS — U071 COVID-19: Secondary | ICD-10-CM | POA: Diagnosis not present

## 2020-09-12 DIAGNOSIS — J9601 Acute respiratory failure with hypoxia: Secondary | ICD-10-CM | POA: Diagnosis not present

## 2020-09-12 DIAGNOSIS — R0902 Hypoxemia: Secondary | ICD-10-CM | POA: Diagnosis not present

## 2020-09-12 LAB — CBC WITH DIFFERENTIAL/PLATELET
Abs Immature Granulocytes: 0.03 10*3/uL (ref 0.00–0.07)
Basophils Absolute: 0 10*3/uL (ref 0.0–0.1)
Basophils Relative: 0 %
Eosinophils Absolute: 0 10*3/uL (ref 0.0–0.5)
Eosinophils Relative: 0 %
HCT: 39.1 % (ref 39.0–52.0)
Hemoglobin: 12.7 g/dL — ABNORMAL LOW (ref 13.0–17.0)
Immature Granulocytes: 0 %
Lymphocytes Relative: 8 %
Lymphs Abs: 0.6 10*3/uL — ABNORMAL LOW (ref 0.7–4.0)
MCH: 30.3 pg (ref 26.0–34.0)
MCHC: 32.5 g/dL (ref 30.0–36.0)
MCV: 93.3 fL (ref 80.0–100.0)
Monocytes Absolute: 0.3 10*3/uL (ref 0.1–1.0)
Monocytes Relative: 4 %
Neutro Abs: 7.5 10*3/uL (ref 1.7–7.7)
Neutrophils Relative %: 88 %
Platelets: 90 10*3/uL — ABNORMAL LOW (ref 150–400)
RBC: 4.19 MIL/uL — ABNORMAL LOW (ref 4.22–5.81)
RDW: 14.4 % (ref 11.5–15.5)
WBC: 8.5 10*3/uL (ref 4.0–10.5)
nRBC: 0 % (ref 0.0–0.2)

## 2020-09-12 LAB — COMPREHENSIVE METABOLIC PANEL
ALT: 43 U/L (ref 0–44)
AST: 106 U/L — ABNORMAL HIGH (ref 15–41)
Albumin: 3.4 g/dL — ABNORMAL LOW (ref 3.5–5.0)
Alkaline Phosphatase: 85 U/L (ref 38–126)
Anion gap: 10 (ref 5–15)
BUN: 61 mg/dL — ABNORMAL HIGH (ref 8–23)
CO2: 20 mmol/L — ABNORMAL LOW (ref 22–32)
Calcium: 8.5 mg/dL — ABNORMAL LOW (ref 8.9–10.3)
Chloride: 112 mmol/L — ABNORMAL HIGH (ref 98–111)
Creatinine, Ser: 1.99 mg/dL — ABNORMAL HIGH (ref 0.61–1.24)
GFR, Estimated: 32 mL/min — ABNORMAL LOW (ref >60)
Glucose, Bld: 117 mg/dL — ABNORMAL HIGH (ref 70–99)
Potassium: 4.8 mmol/L (ref 3.5–5.1)
Sodium: 142 mmol/L (ref 135–145)
Total Bilirubin: 0.7 mg/dL (ref 0.3–1.2)
Total Protein: 7.3 g/dL (ref 6.5–8.1)

## 2020-09-12 LAB — GLUCOSE, CAPILLARY
Glucose-Capillary: 92 mg/dL (ref 70–99)
Glucose-Capillary: 130 mg/dL — ABNORMAL HIGH (ref 70–99)
Glucose-Capillary: 117 mg/dL — ABNORMAL HIGH (ref 70–99)
Glucose-Capillary: 108 mg/dL — ABNORMAL HIGH (ref 70–99)

## 2020-09-12 LAB — MAGNESIUM: Magnesium: 2.1 mg/dL (ref 1.7–2.4)

## 2020-09-12 LAB — C-REACTIVE PROTEIN: CRP: 12.4 mg/dL — ABNORMAL HIGH (ref <1.0)

## 2020-09-12 LAB — D-DIMER, QUANTITATIVE: D-Dimer, Quant: 3.27 ug/mL-FEU — ABNORMAL HIGH (ref 0.00–0.50)

## 2020-09-12 LAB — PHOSPHORUS: Phosphorus: 3.1 mg/dL (ref 2.5–4.6)

## 2020-09-12 LAB — LACTATE DEHYDROGENASE: LDH: 307 U/L — ABNORMAL HIGH (ref 98–192)

## 2020-09-12 LAB — FERRITIN: Ferritin: 203 ng/mL (ref 24–336)

## 2020-09-12 MED ORDER — IPRATROPIUM-ALBUTEROL 20-100 MCG/ACT IN AERS
1.0000 | INHALATION_SPRAY | Freq: Three times a day (TID) | RESPIRATORY_TRACT | Status: DC
  Administered 2020-09-12 – 2020-09-13 (×4): 1 via RESPIRATORY_TRACT

## 2020-09-12 MED ORDER — HEPARIN (PORCINE) 25000 UT/250ML-% IV SOLN
900.0000 [IU]/h | INTRAVENOUS | Status: DC
  Administered 2020-09-12: 22:00:00 1000 [IU]/h via INTRAVENOUS
  Filled 2020-09-12 (×2): qty 250

## 2020-09-12 MED ORDER — METOPROLOL TARTRATE 5 MG/5ML IV SOLN
5.0000 mg | Freq: Once | INTRAVENOUS | Status: AC | PRN
  Administered 2020-09-12: 22:00:00 5 mg via INTRAVENOUS
  Filled 2020-09-12: qty 5

## 2020-09-12 MED ORDER — BARICITINIB 2 MG PO TABS
2.0000 mg | ORAL_TABLET | Freq: Every day | ORAL | Status: DC
  Administered 2020-09-12 – 2020-09-19 (×8): 2 mg via ORAL
  Filled 2020-09-12 (×9): qty 1

## 2020-09-12 MED ORDER — BARICITINIB 2 MG PO TABS: 2.0000 mg | ORAL_TABLET | Freq: Every day | ORAL | Status: DC

## 2020-09-12 NOTE — TOC Initial Note (Signed)
Transition of Care [facility]) - Initial/Assessment Note    Patient Details  Name: Troy [name] MRN: [id number] Date of Birth: 03-Nov-1932  [facility]) CM/SW Contact:    Carles Collet, RN Phone Number: 09/12/2020, 5:01 PM  Clinical Narrative:                 Damaris Schooner w patient's son.  Son confirms that patient lives at home alone. [name] states that he checks on him 5 evenings a week, ensuring he has dinner. His daughter checks on him twice a week, and they have a private duty assistant that stays with him 3 hours a day, 3 days a week, ensuring he has dinner 7 nights a week. Don ensures that patient has RW at home, but shares that the patient refuses to use it.  [name] would be agreeable to SNF if needed, or full [facility] services. Requested PT OT evals from Dr Sherral Hammers, attending.    [place], [place] [phone]  [phone]       Expected Discharge Plan:  (TBD) Barriers to Discharge: Continued Medical Work up   Patient Goals and CMS Choice Patient states their goals for this hospitalization and ongoing recovery are:: increase strength and stability per son      Expected Discharge Plan and Services Expected Discharge Plan:  (TBD)   Discharge Planning Services: CM Consult   Living arrangements for the past 2 months: [place]                                      Prior Living Arrangements/Services Living arrangements for the past 2 months: [place] Lives with:: Self              Current home services: DME (RW, but does not use it)    Activities of Daily Living Home Assistive Devices/Equipment: None ADL Screening (condition at time of admission) Patient's cognitive ability adequate to safely complete daily activities?: Yes Is the patient deaf or have difficulty hearing?: No Does the patient have difficulty seeing, even when wearing glasses/contacts?: No Does the patient have difficulty concentrating, remembering, or making  decisions?: No Patient able to express need for assistance with ADLs?: Yes Does the patient have difficulty dressing or bathing?: Yes Independently performs ADLs?: Yes (appropriate for developmental age) Does the patient have difficulty walking or climbing stairs?: Yes Weakness of Legs: Both Weakness of Arms/Hands: Both  Permission Sought/Granted                  Emotional Assessment              Admission diagnosis:  Hypoxia [R09.02] Acute respiratory failure with hypoxemia ([place]) [J96.01] Patient Active Problem List   Diagnosis Date Noted  . Severe hypoxemia 09/10/2020  . Acute respiratory disease due to COVID-19 virus 09/10/2020  . Acute respiratory failure with hypoxemia ([place]) 09/10/2020  . AKI (acute kidney injury) ([place]) 09/10/2020  . Chronic diastolic CHF (congestive heart failure) ([place]) 09/10/2020  . Cerebral vascular disease 04/10/2020  . Diabetic cataract of both eyes ([place]) 01/24/2020  . Vitamin B12 deficiency 08/03/2019  . History of stroke 08/03/2019  . Pseudodementia 10/01/2018  . Sensorineural hearing loss (SNHL) of both ears 12/31/2017  . Candidal intertrigo 12/22/2017  . Left serous otitis media 07/04/2017  . Hypertension associated with diabetes ([place]) 05/22/2017  . Eustachian tube dysfunction, left 04/04/2017  . Dry skin dermatitis 12/02/2016  .  Health maintenance examination 10/08/2016  . Advanced care planning/counseling discussion 10/08/2016  . Thyromegaly 10/08/2016  . Anemia 10/08/2016  . Aorto-iliac atherosclerosis (HCC) 08/17/2015  . Bleeding hemorrhoid 06/13/2015  . Dementia without behavioral disturbance (HCC) 06/13/2015  . Orthostatic dizziness 08/01/2014  . Paresthesia of foot, bilateral 08/01/2014  . Supraventricular tachycardia (HCC) 11/24/2012  . CAD (coronary artery disease) 11/24/2012  . COPD exacerbation (HCC) 11/10/2012  . Lumbosacral radiculopathy at L5 12/23/2011  . Thrombocytopenia (HCC)   . Barrett's esophagus   . Skin  lesion of back 09/16/2011  . PUD (peptic ulcer disease) 05/16/2011  . Medicare annual wellness visit, subsequent 04/30/2011  . COPD (chronic obstructive pulmonary disease) (HCC) 12/26/2010  . Diabetes mellitus type 2 with complications (HCC) 08/14/2010  . Chronic idiopathic constipation 07/17/2010  . Hyperlipidemia associated with type 2 diabetes mellitus (HCC) 05/13/2009  . Tobacco use 05/13/2009   PCP:  Eustaquio Boyden, MD Pharmacy:   [id number] [place] - [address] [place] [address] Phone: [phone] Fax: [phone]  [facility] - [place], [place] - [address] [place], [address] [place], [address] [place] [address] Phone: [phone] Fax: [phone]  [facility] - [place], [place] - [id number], [address] [place] [address] Phone: [phone] Fax: [phone]     Social Determinants of Health (SDOH) Interventions    Readmission Risk Interventions No flowsheet data found.

## 2020-09-12 NOTE — Progress Notes (Signed)
HR is 140.  On call MD text paged.  Waiting for response.  Will continue to monitor.

## 2020-09-12 NOTE — Progress Notes (Signed)
ANTICOAGULATION CONSULT NOTE - Initial Consult  Pharmacy Consult for Heparin  Indication: pulmonary embolus - possible   Allergies  Allergen Reactions  . Codeine     REACTION: vomiting  . Cyclobenzaprine Hcl     REACTION: vomiting  . Hydrocodone Nausea Only and Other (See Comments)    "crazy"  . Tramadol Other (See Comments)    woozy  . Valium Other (See Comments)    sedation    Patient Measurements: Height: 5\' 8"  (172.7 cm) Weight: 69.9 kg (154 lb 1.6 oz) IBW/kg (Calculated) : 68.4   Vital Signs: Temp: 99.2 F (37.3 C) (12/28 1430) Temp Source: Axillary (12/28 1430) BP: 123/63 (12/28 1430) Pulse Rate: 71 (12/28 1507)  Labs: Recent Labs    09/10/20 1340 09/10/20 1354 09/10/20 1542 09/11/20 0509 09/11/20 1035 09/11/20 1927 09/12/20 0110  HGB 14.0 14.3  --  11.9*  --   --  12.7*  HCT 42.7 42.0  --  34.7*  --   --  39.1  PLT 102*  --   --  82*  --   --  90*  CREATININE 2.63*  --   --  2.41* 2.17*  --  1.99*  TROPONINIHS 45*  --  68*  --   --  78*  --     Estimated Creatinine Clearance: 26.3 mL/min (A) (by C-G formula based on SCr of 1.99 mg/dL (H)).   Medical History: Past Medical History:  Diagnosis Date  . Aorto-iliac atherosclerosis (HCC) 08/2015   by xray  . Barrett's esophagus    EGD 2012, rpt 2014, longterm PPI  . Chronic idiopathic constipation 07/17/2010  . Chronic obstructive pulmonary disease (COPD) (HCC)    spirometry with moderate COPD  . Coronary atherosclerosis of artery bypass graft   . Diabetes mellitus type II ~2008  . Gastrointestinal ulcer 04/2011   with hematemesis s/p EGD by [facility]  . GERD (gastroesophageal reflux disease)   . Hearing loss   . HLD (hyperlipidemia)   . HNP (herniated nucleus pulposus), lumbar 2013   with compression of L5 nerve root and foot drop, also with lumbar DDD s/p surgery  . Hypertrophy of prostate without urinary obstruction and other lower urinary tract symptoms (LUTS)   . Memory loss   . Paroxysmal SVT  (supraventricular tachycardia) (HCC)    a. 3/14 => converted in ED with adenosine to AFib => NSR;  b. Echo 4/14:  Mild LVH, mild FBSH, EF 55-60%, Gr 1 DD  . Prostate nodule ~2009   Left-benign s/p eval Uro WNL ([place])  . Thrombocytopenia (HCC)   . Tobacco use disorder       Assessment: 87yom admitted with SOB found to be COVID +.  Has been on enoxaparin for VTE px.  Unable to go to VQ scan, DDimer elevated at 3, O2 sats drop on [place] and now ST 130s.  R/O PE with change enoxaparin to heparin drip - will not bolus with recent enoxaparin dose and age.    Goal of Therapy:  Heparin level 0.3-0.7 units/ml Monitor platelets by anticoagulation protocol: Yes   Plan:  Stop enoxaparin Heparin drip 1000 uts/hr  Drawn heparin level 6hr after start  Daily HL, CBC Monitor s/s bleeding     03-21-1988 Pharm.D. CPP, BCPS Clinical Pharmacist [phone] 09/12/2020 7:22 PM

## 2020-09-12 NOTE — Telephone Encounter (Signed)
Noted. Thank you.  Will follow along.  

## 2020-09-12 NOTE — Progress Notes (Signed)
°   09/12/20 2158  Assess: MEWS Score  Temp 97.9 F (36.6 C)  BP (!) 174/101  Pulse Rate (!) 157  ECG Heart Rate (!) 123  Resp (!) 35  SpO2 (!) 84 %  O2 Device HFNC  O2 Flow Rate (L/min) 9 L/min  Assess: MEWS Score  MEWS Temp 0  MEWS Systolic 0  MEWS Pulse 2  MEWS RR 2  MEWS LOC 0  MEWS Score 4  MEWS Score Color Red  Assess: if the MEWS score is Yellow or Red  Were vital signs taken at a resting state? Yes  Focused Assessment Change from prior assessment (see assessment flowsheet)  Early Detection of Sepsis Score *See Row Information* Medium  MEWS guidelines implemented *See Row Information* Yes  Treat  MEWS Interventions Administered scheduled meds/treatments;Administered prn meds/treatments;Escalated (See documentation below)  Pain Scale 0-10  Pain Score 0  Take Vital Signs  Increase Vital Sign Frequency  Red: Q 1hr X 4 then Q 4hr X 4, if remains red, continue Q 4hrs  Escalate  MEWS: Escalate Red: discuss with charge nurse/RN and provider, consider discussing with RRT  Notify: Charge Nurse/RN  Name of Charge Nurse/RN Notified Thomes Dinning, RN  Date Charge Nurse/RN Notified 09/12/20  Time Charge Nurse/RN Notified 2158  Notify: Provider  Provider Name/Title Carren Rang, MD  Date Provider Notified 09/12/20  Time Provider Notified 2135  Notification Type Page  Notification Reason Change in status  Response See new orders  Date of Provider Response 09/12/20  Time of Provider Response 2143   This RN in room with Rene Kocher, RN. Patient with moist breaths, respirations 30s, and HR 120s-150s while laying in bed. This RN paged MD. Rene Kocher, RN in communication with MD. Cardiac monitoring was applied. Metoprolol IV given per one time order. HR down to 110s, respirations down to 20s. Will continue to monitor.

## 2020-09-12 NOTE — Progress Notes (Signed)
PROGRESS NOTE    Troy [name]  [id number] DOB: 09/07/1933 DOA: 09/10/2020 PCP: [name], MD     Brief Narrative:  86 y.o. WM PMHx Dementia, Tobacco abuse, COPD, not on home oxygen, CAD status post CABG, HTN, HLD, DM type II controlled with hyperglycemia   Presents the emergency department with reported fall and shortness of breath. Patient has history of dementia, he is pleasant but appears to be a poor historian. Son is at bedside to aid in history. He states that the patient lives home alone, normally is able to care for himself. He checks in about 2 times a day. This morning he found him sitting in the tub. Unclear of the exact mechanism. He noticed that his dad was really short of breath so he called EMS. He was reportedly hypoxic to 60% on room air, improved on nonrebreather. No reported treatments prior to arrival. Patient denies any pain complaints to me.Patient has a chronic cough with may be increased sputum production but no reports of fever, vomiting, diarrhea.   ED Course: T 98.6 140/61  HR 81 RR 26 O2 sat on hiflo 99%, VBG @ presentation 7.2/40.2/33. Cmet notable for glucose 183 BUN 58, Cr 2.64, WBC 9.1, Hgb 14, Plts 102 diff 88/6/6. Reported to be covid POSITIVE. CXR - COPD, NAD. Patient was administered solumedrol 125 mg IV, Remedesivir ordered. [facility] called to admit patient with respiratory failure.    Subjective: 12/28 afebrile overnight   A/O x2 (does not know when, why ), does not know if he was vaccinated for Covid or not.   Assessment & Plan: Covid vaccination;   Active Problems:   Diabetes mellitus type 2 with complications ([place])   Hyperlipidemia associated with type 2 diabetes mellitus ([place])   Tobacco use   COPD (chronic obstructive pulmonary disease) (HCC)   PUD (peptic ulcer disease)   Thrombocytopenia (HCC)   Dementia without behavioral disturbance (HCC)   Severe hypoxemia   Acute respiratory disease due to COVID-19 virus   Acute  respiratory failure with hypoxemia (HCC)   AKI (acute kidney injury) (HCC)   Chronic diastolic CHF (congestive heart failure) (HCC)   Acute respiratory failure with hypoxia/Covid Pneumonia COVID-19 Labs  Recent Labs    09/11/20 0508 09/11/20 0913 09/12/20 0110  DDIMER  --  4.12* 3.27*  FERRITIN 184 196 203  LDH  --  283* 307*  CRP 8.8*  --  12.4*    Lab Results  Component Value Date   SARSCOV2NAA POSITIVE (A) 09/10/2020  -Breo Ellipta 100-25-25 mcg per INH daily -Solu-Medrol 60 mg BID -Remdesivir pharmacy protocol -Baricitinib per pharmacy protocol -Vitamins per Covid protocol -Incentive spirometry -Flutter valve -Guaifenesin -Heparin full dose; given patient's D-dimer and inability to perform CTA PE protocol, and patient's inability to cooperate with VQ scan will empirically start full dose heparin. -12/28 bilateral lower extremity Dopplers pending -12/28 spoke at length with [name] patient's son counseled patient that baricitinib for Covid was an off label use of this medication.  Had not gone through a randomized controlled trial.  Had a theoretical possibility of increasing VTE however had been shown under certain circumstances in Covid patients to be of benefit.  [name] gave [place] permission to begin the use of baricitinib  COPD/Emphysema -See Covid pneumonia  Chronic Diastolic CHF -[id number] echocardiogram; not consistent with diastolic CHF -We will monitor for fluid overload -Strict in and out +1.7 L -Daily weight Filed Weights   09/10/20 1332  Weight: 70.3 kg    DM type  II controlled with complication/DM nephropathy -May/2021 Hemoglobin A1c= 5.8 -Lantus 10 units qhs -Resistant SSI  HLD   AKI?  (Baseline Cr 1.47 on 07/26/2019)  Lab Results  Component Value Date   CREATININE 1.99 (H) 09/12/2020   CREATININE 2.17 (H) 09/11/2020   CREATININE 2.41 (H) 09/11/2020   CREATININE 2.63 (H) 09/10/2020   CREATININE 1.47 07/26/2019  -Gentle hydration D5-0.9% saline  72ml/hr  Dementia -Per EMR last neuro eval 04/10/20 - MMSE 16/30. No behavioral disturbance -Per EMR patient lives alone, no relatives present to give baseline.   Chronic thrombocytopenia (baseline Plt ct147) -Monitor closely -Currently no signs or symptoms of acute bleed  Tobacco abuse -60 pk/yr and continues to smoke -Nicotine patch  PUD -No active c/o. Continue PPI     DVT prophylaxis: Heparin full dose Code Status: DNR Son confirms DNR/DNI Family Communication: 12/28 spoke with [name] patient's son counseled on plan of care answered all questions Status is: Inpatient    Dispo: The patient is from: Home              Anticipated d/c is to: Home              Anticipated d/c date is: 1/5              Patient currently unstable      Consultants:    Procedures/Significant Events:  12/26 DG chest;COPD without acute abnormality 12/26 CT chest W0 contrast; Emphysematous disease with biapical pleural thickening right worse than left. 1 cm nodule opacity over the posteromedial right lower lobe with subtle adjacent hazy nodular opacification. 7-8 mm nodule over the more superior aspect of the left lower lobe. Findings may be seen with atypical infection versus inflammatory process and less likely underlying neoplastic/metastatic disease. Recommend follow-up CT 6-8 weeks. 2. Emphysema and aortic atherosclerosis. Mild dilatation of the ascending thoracic aorta measuring 3.7 cm. Recommend annual imaging followup by CTA or MRA   12/27 Echocardiogram;LVEF=55 to 60%.  -Left ventricular diastolic parameters are indeterminate.      I have personally reviewed and interpreted all radiology studies and my findings are as above.  VENTILATOR SETTINGS: HFNC 12/28 Flow 4 L/min SPO2 94%   Cultures 12/26 SARS coronavirus positive 12/26 influenza A/B negative     Antimicrobials: Anti-infectives (From admission, onward)   Start     Ordered Stop   09/11/20 1000   remdesivir 100 mg in sodium chloride 0.9 % 100 mL IVPB       "Followed by" Linked Group Details   09/10/20 1533 09/15/20 0959   09/10/20 1600  remdesivir 200 mg in sodium chloride 0.9% 250 mL IVPB       "Followed by" Linked Group Details   09/10/20 1533 09/10/20 1819       Devices    LINES / TUBES:      Continuous Infusions: . dextrose 5 % and 0.9% NaCl 75 mL/hr at 09/11/20 1958  . remdesivir 100 mg in NS 100 mL 100 mg (09/12/20 0908)     Objective: Vitals:   09/11/20 2138 09/11/20 2311 09/12/20 0316 09/12/20 0750  BP: (!) 161/82 (!) 146/66 (!) 156/75 (!) 158/89  Pulse: 82 73 79 83  Resp:  20 20 19   Temp:  98.4 F (36.9 C) 98 F (36.7 C) 99.8 F (37.7 C)  TempSrc:   Oral Axillary  SpO2:  97% 100% 97%  Weight:      Height:        Intake/Output Summary (Last 24 hours) at 09/12/2020  [id number] Last data filed at 09/12/2020 [id number] Gross per 24 hour  Intake 740 ml  Output 750 ml  Net -10 ml   Filed Weights   09/10/20 1332  Weight: 70.3 kg    Examination:  General: A/O x2 (does not know when, why) positive acute respiratory distress Eyes: negative scleral hemorrhage, negative anisocoria, negative icterus ENT: Negative Runny nose, negative gingival bleeding, Neck:  Negative scars, masses, torticollis, lymphadenopathy, JVD Lungs: decreased breath sounds bilaterally without wheezes or crackles Cardiovascular: Regular rate and rhythm without murmur gallop or rub normal S1 and S2 Abdomen: negative abdominal pain, nondistended, positive soft, bowel sounds, no rebound, no ascites, no appreciable mass Extremities: No significant cyanosis, clubbing, or edema bilateral lower extremities Skin: Negative rashes, lesions, ulcers Psychiatric:  Negative depression, negative anxiety, negative fatigue, negative mania  Central nervous system:  Cranial nerves II through XII intact, tongue/uvula midline, all extremities muscle strength 5/5, sensation intact throughout, negative  dysarthria, negative expressive aphasia, negative receptive aphasia.  .     Data Reviewed: Care during the described time interval was provided by me .  I have reviewed this patient's available data, including medical history, events of note, physical examination, and all test results as part of my evaluation.  CBC: Recent Labs  Lab 09/10/20 1340 09/10/20 1354 09/11/20 0509 09/12/20 0110  WBC 9.1  --  10.2 8.5  NEUTROABS 8.0*  --  8.9* 7.5  HGB 14.0 14.3 11.9* 12.7*  HCT 42.7 42.0 34.7* 39.1  MCV 96.0  --  92.5 93.3  PLT 102*  --  82* 90*   Basic Metabolic Panel: Recent Labs  Lab 09/10/20 1340 09/10/20 1354 09/11/20 0509 09/11/20 0913 09/11/20 1035 09/12/20 0110  NA 141 143 135  --  141 142  K 5.3* 5.1 5.7*  --  4.7 4.8  CL 109  --  109  --  114* 112*  CO2 13*  --  15*  --  16* 20*  GLUCOSE 183*  --  130*  --  124* 117*  BUN 58*  --  66*  --  62* 61*  CREATININE 2.63*  --  2.41*  --  2.17* 1.99*  CALCIUM 9.2  --  8.2*  --  8.3* 8.5*  MG  --   --   --  2.1  --  2.1  PHOS  --   --   --  4.1  --  3.1   GFR: Estimated Creatinine Clearance: 26.3 mL/min (A) (by C-G formula based on SCr of 1.99 mg/dL (H)). Liver Function Tests: Recent Labs  Lab 09/10/20 1340 09/11/20 0509 09/12/20 0110  AST 65* 101* 106*  ALT 41 35 43  ALKPHOS 120 86 85  BILITOT 0.8 1.2 0.7  PROT 8.3* 6.4* 7.3  ALBUMIN 4.2 3.2* 3.4*   No results for input(s): LIPASE, AMYLASE in the last 168 hours. No results for input(s): AMMONIA in the last 168 hours. Coagulation Profile: No results for input(s): INR, PROTIME in the last 168 hours. Cardiac Enzymes: No results for input(s): CKTOTAL, CKMB, CKMBINDEX, TROPONINI in the last 168 hours. BNP (last 3 results) No results for input(s): PROBNP in the last 8760 hours. HbA1C: Recent Labs    09/11/20 0510  HGBA1C 6.1*   CBG: Recent Labs  Lab 09/11/20 0923 09/11/20 1245 09/11/20 1648 09/11/20 2137 09/12/20 0849  GLUCAP 113* 125* 116* 117* 92    Lipid Profile: No results for input(s): CHOL, HDL, LDLCALC, TRIG, CHOLHDL, LDLDIRECT in the last 72 hours. Thyroid Function  Tests: No results for input(s): TSH, T4TOTAL, FREET4, T3FREE, THYROIDAB in the last 72 hours. Anemia Panel: Recent Labs    09/11/20 0913 09/12/20 0110  FERRITIN 196 203   Sepsis Labs: Recent Labs  Lab 09/11/20 0508  PROCALCITON 4.51    Recent Results (from the past 240 hour(s))  Resp Panel by RT-PCR (Flu A&B, Covid) Nasopharyngeal Swab     Status: Abnormal   Collection Time: 09/10/20  1:27 PM   Specimen: Nasopharyngeal Swab; Nasopharyngeal(NP) swabs in vial transport medium  Result Value Ref Range Status   SARS Coronavirus 2 by RT PCR POSITIVE (A) NEGATIVE Final    Comment: RESULT CALLED TO, READ BACK BY AND VERIFIED WITH: JOE B. [id number] (NOTE) SARS-CoV-2 target nucleic acids are DETECTED.  The SARS-CoV-2 RNA is generally detectable in upper respiratory specimens during the acute phase of infection. Positive results are indicative of the presence of the identified virus, but do not rule out bacterial infection or co-infection with other pathogens not detected by the test. Clinical correlation with patient history and other diagnostic information is necessary to determine patient infection status. The expected result is Negative.  Fact Sheet for Patients: EntrepreneurPulse.com.au  Fact Sheet for Healthcare Providers: IncredibleEmployment.be  This test is not yet approved or cleared by the [place] FDA and  has been authorized for detection and/or diagnosis of SARS-CoV-2 by FDA under an Emergency Use Authorization (EUA).  This EUA will remain in effect (meaning this test can be used) for the  duration of  the COVID-19 declaration under Section 564(b)(1) of the Act, 21 U.S.C. section 360bbb-3(b)(1), unless the authorization is terminated or revoked sooner.     Influenza A by PCR NEGATIVE NEGATIVE  Final   Influenza B by PCR NEGATIVE NEGATIVE Final    Comment: (NOTE) The Xpert Xpress SARS-CoV-2/FLU/RSV plus assay is intended as an aid in the diagnosis of influenza from Nasopharyngeal swab specimens and should not be used as a sole basis for treatment. Nasal washings and aspirates are unacceptable for Xpert Xpress SARS-CoV-2/FLU/RSV testing.  Fact Sheet for Patients: EntrepreneurPulse.com.au  Fact Sheet for Healthcare Providers: IncredibleEmployment.be  This test is not yet approved or cleared by the [place] FDA and has been authorized for detection and/or diagnosis of SARS-CoV-2 by FDA under an Emergency Use Authorization (EUA). This EUA will remain in effect (meaning this test can be used) for the duration of the COVID-19 declaration under Section 564(b)(1) of the Act, 21 U.S.C. section 360bbb-3(b)(1), unless the authorization is terminated or revoked.  Performed at [facility], [place] [address], [place],  [address]          Radiology Studies: DG Chest 1 View  Result Date: 09/10/2020 CLINICAL DATA:  Shortness of breath EXAM: CHEST  1 VIEW COMPARISON:  06/30/2018 FINDINGS: Cardiac shadow is within normal limits. Postsurgical changes are seen. The lungs are hyperinflated. No focal infiltrate or sizable effusion is seen. No bony abnormality is noted. IMPRESSION: COPD without acute abnormality. Electronically Signed   By: Inez Catalina [name]   On: 09/10/2020 13:45   CT Head Wo Contrast  Result Date: 09/10/2020 CLINICAL DATA:  Recent head trauma, initial encounter EXAM: CT HEAD WITHOUT CONTRAST TECHNIQUE: Contiguous axial images were obtained from the base of the skull through the vertex without intravenous contrast. COMPARISON:  None. FINDINGS: Brain: No evidence of acute infarction, hemorrhage, hydrocephalus, extra-axial collection or mass lesion/mass effect. Chronic atrophic and ischemic changes are noted. Vascular: No  hyperdense vessel or unexpected calcification. Skull:  Normal. Negative for fracture or focal lesion. Sinuses/Orbits: No acute finding. Other: None. IMPRESSION: Chronic atrophic and ischemic changes without acute abnormality. Electronically Signed   By: Alcide Clever [name]   On: 09/10/2020 14:28   CT CHEST WO CONTRAST  Result Date: 09/10/2020 CLINICAL DATA:  Respiratory failure.  COVID-19 positive.  Hypoxia. EXAM: CT CHEST WITHOUT CONTRAST TECHNIQUE: Multidetector CT imaging of the chest was performed following the standard protocol without IV contrast. COMPARISON:  None. FINDINGS: Cardiovascular: Heart is normal size. Patient is post median sternotomy/CABG. Mild ectasia of the ascending thoracic aorta measuring 3.7 cm. There is calcified plaque throughout the thoracic aorta. Remaining vascular structures are unremarkable. Mediastinum/Nodes: No mediastinal or hilar adenopathy. Remaining mediastinal structures are normal. Lungs/Pleura: Lungs are hyperexpanded with centrilobular emphysematous disease. Biapical pleural thickening right worse than left. There is a 1 cm nodule opacity over the posteromedial right lower lobe with subtle adjacent possible hazy nodular opacification 7-8 mm nodule over the more superior aspect of the left lower lobe. Mild linear density with focal nodularity over the right middle lobe medially. No effusion. Airways are normal. Upper Abdomen: Calcified plaque over the abdominal aorta. No acute findings Musculoskeletal: Degenerative change of the spine. IMPRESSION: 1. Emphysematous disease with biapical pleural thickening right worse than left. 1 cm nodule opacity over the posteromedial right lower lobe with subtle adjacent hazy nodular opacification. 7-8 mm nodule over the more superior aspect of the left lower lobe. Findings may be seen with atypical infection versus inflammatory process and less likely underlying neoplastic/metastatic disease. Recommend follow-up CT 6-8 weeks. 2.  Emphysema and aortic atherosclerosis. Mild dilatation of the ascending thoracic aorta measuring 3.7 cm. Recommend annual imaging followup by CTA or MRA. This recommendation follows 2010 ACCF/AHA/AATS/ACR/ASA/SCA/SCAI/SIR/STS/SVM Guidelines for the Diagnosis and Management of Patients with Thoracic Aortic Disease. Circulation.2010; 121: Z308-M578. Aortic aneurysm NOS (ICD10-I71.9). Aortic Atherosclerosis (ICD10-I70.0) and Emphysema (ICD10-J43.9). Electronically Signed   By: Elberta Fortis [name]   On: 09/10/2020 18:08   CT Cervical Spine Wo Contrast  Result Date: 09/10/2020 CLINICAL DATA:  Recent trauma with neck pain, initial encounter EXAM: CT CERVICAL SPINE WITHOUT CONTRAST TECHNIQUE: Multidetector CT imaging of the cervical spine was performed without intravenous contrast. Multiplanar CT image reconstructions were also generated. COMPARISON:  None. FINDINGS: Alignment: Within normal limits. Skull base and vertebrae: 7 cervical segments are well visualized. Vertebral body height is well maintained. Multilevel osteophytic changes and facet hypertrophic changes are seen. No acute fracture or acute facet abnormality is noted. The odontoid is within normal limits. Soft tissues and spinal canal: Surrounding soft tissue structures are within normal limits with the exception of vascular calcifications. Upper chest: Pleural and parenchymal scarring is noted in the apices bilaterally. Other: None IMPRESSION: Multilevel degenerative change without acute abnormality. Electronically Signed   By: Alcide Clever [name]   On: 09/10/2020 14:32   ECHOCARDIOGRAM COMPLETE  Result Date: 09/11/2020    ECHOCARDIOGRAM REPORT   Patient Name:   Troy [name] [facility] Date of Exam: 09/11/2020 Medical Rec #:  [id number]        Height:       68.0 in Accession #:    [id number]       Weight:       155.0 lb Date of Birth:  28-Aug-1933       BSA:          1.834 m Patient Age:    87 years         BP:  135/71 mmHg Patient Gender: M                 HR:           77 bpm. Exam Location:  Inpatient Procedure: 2D Echo, Cardiac Doppler and Color Doppler Indications:    I50.40* Unspecified combined systolic (congestive) and diastolic                 (congestive) heart failure  History:        Patient has prior history of Echocardiogram examinations, most                 recent 01/02/2013. CHF, CAD, Abnormal ECG, COPD, Arrythmias:SVT,                 Signs/Symptoms:Shortness of Breath, Dyspnea, Altered Mental                 Status and Alzheimer's; Risk Factors:Dyslipidemia. Covid                 positive.  Sonographer:    Roseanna Rainbow RDCS Referring Phys: [place] Comments: Technically difficult study due to poor echo windows. Patient could not turn. Study interrupted to answer phone for endo. IMPRESSIONS  1. Left ventricular ejection fraction, by estimation, is 55 to 60%. The left ventricle has normal function. Abnormal sepal motion, no other regional wall motion abnormalities noted. There is mild concentric left ventricular hypertrophy. Left ventricular  diastolic parameters are indeterminate.  2. Right ventricular systolic function is normal. The right ventricular size is normal. Mildly increased right ventricular wall thickness.  3. The mitral valve is grossly normal. No evidence of mitral valve regurgitation.  4. The aortic valve was not well visualized. There is mild calcification of the aortic valve. Aortic valve regurgitation is not visualized. No aortic stenosis is present.  5. Aortic dilatation noted. There is mild dilatation of the aortic root, measuring 40 mm.  6. The inferior vena cava is normal in size with greater than 50% respiratory variability, suggesting right atrial pressure of 3 mmHg. Comparison(s): A prior study was performed on 12/23/2012. Prior images reviewed side by side. Compared to prior, increase in aortic root size. FINDINGS  Left Ventricle: Left ventricular ejection fraction, by estimation, is 55 to 60%. The  left ventricle has normal function. The left ventricle has no regional wall motion abnormalities. The left ventricular internal cavity size was normal in size. There is  mild concentric left ventricular hypertrophy. Left ventricular diastolic parameters are indeterminate. Right Ventricle: The right ventricular size is normal. Mildly increased right ventricular wall thickness. Right ventricular systolic function is normal. Left Atrium: Left atrial size was normal in size. Right Atrium: Right atrial size was normal in size. Pericardium: The pericardium was not well visualized. Mitral Valve: The mitral valve is grossly normal. No evidence of mitral valve regurgitation. Tricuspid Valve: The tricuspid valve is grossly normal. Tricuspid valve regurgitation is not demonstrated. Aortic Valve: The aortic valve was not well visualized. There is mild calcification of the aortic valve. Aortic valve regurgitation is not visualized. No aortic stenosis is present. Pulmonic Valve: The pulmonic valve was not well visualized. Pulmonic valve regurgitation is not visualized. Aorta: Aortic dilatation noted. There is mild dilatation of the aortic root, measuring 40 mm. Venous: The inferior vena cava is normal in size with greater than 50% respiratory variability, suggesting right atrial pressure of 3 mmHg. IAS/Shunts: The atrial septum is grossly normal.  LEFT VENTRICLE PLAX 2D LVIDd:  4.05 cm     Diastology LVIDs:         2.63 cm     LV e' medial:    7.29 cm/s LV PW:         1.10 cm     LV E/e' medial:  9.1 LV IVS:        1.10 cm     LV e' lateral:   8.27 cm/s LVOT diam:     2.30 cm     LV E/e' lateral: 8.0 LV SV:         94 LV SV Index:   51 LVOT Area:     4.15 cm  LV Volumes (MOD) LV vol d, MOD A2C: 32.1 ml LV vol d, MOD A4C: 54.6 ml LV vol s, MOD A2C: 10.3 ml LV vol s, MOD A4C: 12.4 ml LV SV MOD A2C:     21.8 ml LV SV MOD A4C:     54.6 ml LV SV MOD BP:      32.1 ml RIGHT VENTRICLE             IVC RV S prime:     14.50 cm/s   IVC diam: 1.80 cm TAPSE (M-mode): 1.7 cm LEFT ATRIUM           Index      RIGHT ATRIUM          Index LA diam:      2.40 cm 1.31 cm/m RA Area:     9.59 cm LA Vol (A2C): 14.7 ml 8.02 ml/m RA Volume:   18.60 ml 10.14 ml/m LA Vol (A4C): 8.7 ml  4.72 ml/m  AORTIC VALVE LVOT Vmax:   116.00 cm/s LVOT Vmean:  70.400 cm/s LVOT VTI:    0.227 m  AORTA Ao Root diam: 4.00 cm MITRAL VALVE MV Area (PHT): 2.20 cm     SHUNTS MV Decel Time: 345 msec     Systemic VTI:  0.23 m MV E velocity: 66.20 cm/s   Systemic Diam: 2.30 cm MV A velocity: 114.00 cm/s MV E/A ratio:  0.58 Rudean Haskell MD Electronically signed by Rudean Haskell MD Signature Date/Time: 09/11/2020/1:12:15 PM    Final         Scheduled Meds: . vitamin C  500 mg Oral Daily  . aspirin EC  81 mg Oral Daily  . donepezil  10 mg Oral QHS  . enoxaparin (LOVENOX) injection  30 mg Subcutaneous Q24H  . fluticasone furoate-vilanterol  1 puff Inhalation Daily  . insulin aspart  0-20 Units Subcutaneous TID WC  . insulin glargine  10 Units Subcutaneous QHS  . Ipratropium-Albuterol  1 puff Inhalation TID  . isosorbide mononitrate  30 mg Oral Daily  . memantine  10 mg Oral BID  . methylPREDNISolone (SOLU-MEDROL) injection  60 mg Intravenous Q24H  . metoprolol tartrate  25 mg Oral q morning - 10a   And  . metoprolol tartrate  12.5 mg Oral QHS  . nicotine  14 mg Transdermal Daily  . pantoprazole  80 mg Oral Daily  . senna  1 tablet Oral BID  . sertraline  50 mg Oral Daily  . simvastatin  40 mg Oral QHS  . vitamin B-12  500 mcg Oral Daily  . zinc sulfate  220 mg Oral Daily   Continuous Infusions: . dextrose 5 % and 0.9% NaCl 75 mL/hr at 09/11/20 1958  . remdesivir 100 mg in NS 100 mL 100 mg (09/12/20 0908)     LOS: 2  days    Time spent:40 min   Darriona Dehaas, Geraldo Docker, MD [facility] Pager [phone]  If 7PM-7AM, please contact night-coverage [email or web address] Password [email or web address] 09/12/2020, 9:10 AM

## 2020-09-12 NOTE — Telephone Encounter (Signed)
Pt's son called the office and left a message on triage line to let Dr Reece Agar know the pt was in the hospital.

## 2020-09-13 ENCOUNTER — Inpatient Hospital Stay (HOSPITAL_BASED_OUTPATIENT_CLINIC_OR_DEPARTMENT_OTHER): Payer: Medicare Other

## 2020-09-13 ENCOUNTER — Inpatient Hospital Stay (HOSPITAL_COMMUNITY): Payer: Medicare Other

## 2020-09-13 DIAGNOSIS — N179 Acute kidney failure, unspecified: Secondary | ICD-10-CM | POA: Diagnosis not present

## 2020-09-13 DIAGNOSIS — U071 COVID-19: Secondary | ICD-10-CM | POA: Diagnosis not present

## 2020-09-13 DIAGNOSIS — M7989 Other specified soft tissue disorders: Secondary | ICD-10-CM

## 2020-09-13 DIAGNOSIS — I5032 Chronic diastolic (congestive) heart failure: Secondary | ICD-10-CM | POA: Diagnosis not present

## 2020-09-13 DIAGNOSIS — J431 Panlobular emphysema: Secondary | ICD-10-CM | POA: Diagnosis not present

## 2020-09-13 LAB — CBC WITH DIFFERENTIAL/PLATELET
Abs Immature Granulocytes: 0.04 10*3/uL (ref 0.00–0.07)
Basophils Absolute: 0 10*3/uL (ref 0.0–0.1)
Basophils Relative: 0 %
Eosinophils Absolute: 0 10*3/uL (ref 0.0–0.5)
Eosinophils Relative: 0 %
HCT: 36.5 % — ABNORMAL LOW (ref 39.0–52.0)
Hemoglobin: 12.1 g/dL — ABNORMAL LOW (ref 13.0–17.0)
Immature Granulocytes: 1 %
Lymphocytes Relative: 9 %
Lymphs Abs: 0.7 10*3/uL (ref 0.7–4.0)
MCH: 30.6 pg (ref 26.0–34.0)
MCHC: 33.2 g/dL (ref 30.0–36.0)
MCV: 92.2 fL (ref 80.0–100.0)
Monocytes Absolute: 0.3 10*3/uL (ref 0.1–1.0)
Monocytes Relative: 4 %
Neutro Abs: 6.6 10*3/uL (ref 1.7–7.7)
Neutrophils Relative %: 86 %
Platelets: 85 10*3/uL — ABNORMAL LOW (ref 150–400)
RBC: 3.96 MIL/uL — ABNORMAL LOW (ref 4.22–5.81)
RDW: 14.4 % (ref 11.5–15.5)
WBC: 7.6 10*3/uL (ref 4.0–10.5)
nRBC: 0 % (ref 0.0–0.2)

## 2020-09-13 LAB — COMPREHENSIVE METABOLIC PANEL
ALT: 42 U/L (ref 0–44)
AST: 83 U/L — ABNORMAL HIGH (ref 15–41)
Albumin: 2.8 g/dL — ABNORMAL LOW (ref 3.5–5.0)
Alkaline Phosphatase: 69 U/L (ref 38–126)
Anion gap: 10 (ref 5–15)
BUN: 51 mg/dL — ABNORMAL HIGH (ref 8–23)
CO2: 16 mmol/L — ABNORMAL LOW (ref 22–32)
Calcium: 8.4 mg/dL — ABNORMAL LOW (ref 8.9–10.3)
Chloride: 114 mmol/L — ABNORMAL HIGH (ref 98–111)
Creatinine, Ser: 1.57 mg/dL — ABNORMAL HIGH (ref 0.61–1.24)
GFR, Estimated: 43 mL/min — ABNORMAL LOW (ref >60)
Glucose, Bld: 113 mg/dL — ABNORMAL HIGH (ref 70–99)
Potassium: 4.9 mmol/L (ref 3.5–5.1)
Sodium: 140 mmol/L (ref 135–145)
Total Bilirubin: 0.5 mg/dL (ref 0.3–1.2)
Total Protein: 6.3 g/dL — ABNORMAL LOW (ref 6.5–8.1)

## 2020-09-13 LAB — MAGNESIUM: Magnesium: 2.1 mg/dL (ref 1.7–2.4)

## 2020-09-13 LAB — LACTATE DEHYDROGENASE: LDH: 302 U/L — ABNORMAL HIGH (ref 98–192)

## 2020-09-13 LAB — GLUCOSE, CAPILLARY
Glucose-Capillary: 118 mg/dL — ABNORMAL HIGH (ref 70–99)
Glucose-Capillary: 107 mg/dL — ABNORMAL HIGH (ref 70–99)
Glucose-Capillary: 115 mg/dL — ABNORMAL HIGH (ref 70–99)
Glucose-Capillary: 120 mg/dL — ABNORMAL HIGH (ref 70–99)

## 2020-09-13 LAB — HEPARIN LEVEL (UNFRACTIONATED)
Heparin Unfractionated: 0.89 IU/mL — ABNORMAL HIGH (ref 0.30–0.70)
Heparin Unfractionated: 0.9 IU/mL — ABNORMAL HIGH (ref 0.30–0.70)

## 2020-09-13 LAB — PHOSPHORUS: Phosphorus: 2.4 mg/dL — ABNORMAL LOW (ref 2.5–4.6)

## 2020-09-13 LAB — D-DIMER, QUANTITATIVE: D-Dimer, Quant: 2.43 ug/mL-FEU — ABNORMAL HIGH (ref 0.00–0.50)

## 2020-09-13 LAB — FERRITIN: Ferritin: 159 ng/mL (ref 24–336)

## 2020-09-13 LAB — C-REACTIVE PROTEIN: CRP: 18.9 mg/dL — ABNORMAL HIGH (ref <1.0)

## 2020-09-13 MED ORDER — IPRATROPIUM-ALBUTEROL 20-100 MCG/ACT IN AERS: 2.0000 | INHALATION_SPRAY | Freq: Three times a day (TID) | RESPIRATORY_TRACT | Status: DC

## 2020-09-13 MED ORDER — ALBUTEROL SULFATE HFA 108 (90 BASE) MCG/ACT IN AERS
4.0000 | INHALATION_SPRAY | Freq: Four times a day (QID) | RESPIRATORY_TRACT | Status: DC
  Administered 2020-09-13 – 2020-09-14 (×4): 4 via RESPIRATORY_TRACT
  Filled 2020-09-13: qty 6.7

## 2020-09-13 MED ORDER — ALBUTEROL SULFATE HFA 108 (90 BASE) MCG/ACT IN AERS: 2.0000 | INHALATION_SPRAY | RESPIRATORY_TRACT | Status: DC | PRN

## 2020-09-13 MED ORDER — LORAZEPAM 2 MG/ML IJ SOLN
1.0000 mg | Freq: Once | INTRAMUSCULAR | Status: AC
  Administered 2020-09-13: 01:00:00 1 mg via INTRAVENOUS
  Filled 2020-09-13: qty 1

## 2020-09-13 MED ORDER — HALOPERIDOL LACTATE 5 MG/ML IJ SOLN
1.0000 mg | Freq: Four times a day (QID) | INTRAMUSCULAR | Status: DC | PRN
  Administered 2020-09-13 – 2020-09-14 (×2): 1 mg via INTRAVENOUS
  Filled 2020-09-13 (×2): qty 1

## 2020-09-13 MED ORDER — APIXABAN 2.5 MG PO TABS
2.5000 mg | ORAL_TABLET | Freq: Two times a day (BID) | ORAL | Status: DC
  Administered 2020-09-13 – 2020-09-19 (×14): 2.5 mg via ORAL
  Filled 2020-09-13 (×14): qty 1

## 2020-09-13 NOTE — Progress Notes (Signed)
ANTICOAGULATION CONSULT NOTE - Initial Consult  Pharmacy Consult for Heparin>>apixaban Indication: afib  Allergies  Allergen Reactions  . Codeine     REACTION: vomiting  . Cyclobenzaprine Hcl     REACTION: vomiting  . Hydrocodone Nausea Only and Other (See Comments)    "crazy"  . Tramadol Other (See Comments)    woozy  . Valium Other (See Comments)    sedation    Patient Measurements: Height: 5\' 8"  (172.7 cm) Weight: 69.9 kg (154 lb 1.6 oz) IBW/kg (Calculated) : 68.4   Vital Signs: Temp: 98.7 F (37.1 C) (12/29 1035) Temp Source: Axillary (12/29 1035) BP: 101/74 (12/29 1035) Pulse Rate: 102 (12/29 1035)  Labs: Recent Labs    09/10/20 1340 09/10/20 1354 09/10/20 1542 09/11/20 0509 09/11/20 1035 09/11/20 1927 09/12/20 0110 09/13/20 0103  HGB 14.0   < >  --  11.9*  --   --  12.7* 12.1*  HCT 42.7   < >  --  34.7*  --   --  39.1 36.5*  PLT 102*  --   --  82*  --   --  90* 85*  HEPARINUNFRC  --   --   --   --   --   --   --  0.89*  CREATININE 2.63*  --   --  2.41* 2.17*  --  1.99* 1.57*  TROPONINIHS 45*  --  68*  --   --  78*  --   --    < > = values in this interval not displayed.    Estimated Creatinine Clearance: 33.3 mL/min (A) (by C-G formula based on SCr of 1.57 mg/dL (H)).   Medical History: Past Medical History:  Diagnosis Date  . Aorto-iliac atherosclerosis ([place]) 08/2015   by xray  . Barrett's esophagus    EGD 2012, rpt 2014, longterm PPI  . Chronic idiopathic constipation 07/17/2010  . Chronic obstructive pulmonary disease (COPD) ([facility])    spirometry with moderate COPD  . Coronary atherosclerosis of artery bypass graft   . Diabetes mellitus type II ~2008  . Gastrointestinal ulcer 04/2011   with hematemesis s/p EGD by [facility]  . GERD (gastroesophageal reflux disease)   . Hearing loss   . HLD (hyperlipidemia)   . HNP (herniated nucleus pulposus), lumbar 2013   with compression of L5 nerve root and foot drop, also with lumbar DDD s/p surgery  .  Hypertrophy of prostate without urinary obstruction and other lower urinary tract symptoms (LUTS)   . Memory loss   . Paroxysmal SVT (supraventricular tachycardia) ([place])    a. 3/14 => converted in ED with adenosine to AFib => NSR;  b. Echo 4/14:  Mild LVH, mild FBSH, EF 55-60%, Gr 1 DD  . Prostate nodule ~2009   Left-benign s/p eval Uro WNL ([place])  . Thrombocytopenia ([place])   . Tobacco use disorder       Assessment: 87yom admitted with SOB found to be COVID +.  Has been on enoxaparin for VTE px.  Unable to go to VQ scan, DDimer elevated at 3, O2 sats drop on [place] and now ST 130s.  R/O PE with change enoxaparin to heparin drip - will not bolus with recent enoxaparin dose and age.   Pt is now confirmed with afib. Transition to apixaban per Dr. Sloan Leiter. Age>80 Wt>60kg Scr>1.5   Goal of Therapy:  Monitor platelets by anticoagulation protocol: Yes   Plan:  Dc heparin Apixaban 2.5mg  PO BID  Onnie Boer, PharmD, BCIDP, AAHIVP, CPP  Infectious Disease Pharmacist 09/13/2020 10:59 AM

## 2020-09-13 NOTE — Evaluation (Signed)
Physical Therapy Evaluation Patient Details Name: Troy [name] MRN: [id number] DOB: Jul 20, 1933 Today's Date: 09/13/2020   History of Present Illness  Patient is a 86 y.o. male with PMHx of COPD, CAD s/p CABG, HTN, HLD, DM-2, HL, tobacco use-presented with fall/shortness of breath-found to have acute hypoxic respiratory failure due to COVID-19 pneumonia.  Hospital course complicated by development of A. fib with RVR.  Clinical Impression  Pt admitted with above diagnosis. Pt presenting with decreased mobility, balance, safety, endurance, and strength.  Pt with very poor safety awareness and awareness of deficits.  He had strong posterior lean with activity requiring mod A at times for balance.  Pt is motivated and focused on going home.  He was questionable historian  -unsure of actual PLOF or home support.  Pt requiring 5-6 L HFNC O2 throughout session with sats dropping to 86%.  HR was 100 bpm rest but up to 145 bpm with activity.   Pt currently with functional limitations due to the deficits listed below (see PT Problem List). Pt will benefit from skilled PT to increase their independence and safety with mobility to allow discharge to the venue listed below.       Follow Up Recommendations SNF    Equipment Recommendations  Rolling walker with 5" wheels;3in1 (PT);Wheelchair cushion (measurements PT);Wheelchair (measurements PT)    Recommendations for Other Services       Precautions / Restrictions Precautions Precautions: Fall      Mobility  Bed Mobility Overal bed mobility: Needs Assistance Bed Mobility: Supine to Sit;Sit to Supine     Supine to sit: Min assist Sit to supine: Supervision   General bed mobility comments: Min A to lift trunk for sitting    Transfers Overall transfer level: Needs assistance Equipment used: Rolling walker (2 wheeled) Transfers: Stand Pivot Transfers Sit to Stand: Mod assist Stand pivot transfers: Mod assist       General transfer  comment: Performed x 3.  Pt only requiring min A to rise but mod A to steady at times due to posterior lean  Ambulation/Gait Ambulation/Gait assistance: Mod assist Gait Distance (Feet): 8 Feet Assistive device: Rolling walker (2 wheeled) Gait Pattern/deviations: Step-to pattern;Decreased stride length;Trunk flexed;Narrow base of support Gait velocity: decreased   General Gait Details: Pt very unsteady, posterior lean, poor safety.  He walked 4' forward and was fatiguing, cued to turn and walk back to bed but instead he just walked backward completely unaware requiring mod A to maintain standing.  Stairs            Wheelchair Mobility    Modified Rankin (Stroke Patients Only)       Balance Overall balance assessment: Needs assistance Sitting-balance support: No upper extremity supported;Bilateral upper extremity supported Sitting balance-Leahy Scale: Fair Sitting balance - Comments: Static sitting without UE support but with the least challenge pt leaning posteriorly Postural control: Posterior lean   Standing balance-Leahy Scale: Poor Standing balance comment: requiring RW and mod A due to posterior lean - pt unaware of lean and unable to correct                             Pertinent Vitals/Pain Pain Assessment: No/denies pain    Home Living Family/patient expects to be discharged to:: Private residence Living Arrangements: Children Available Help at Discharge: Family;Available 24 hours/day (reports son and daughter) Type of Home: House Home Access: Stairs to enter Entrance Stairs-Rails: None Entrance Stairs-Number of Steps: 1  Home Layout: One level Home Equipment: Walker - 2 wheels;Cane - single point;Bedside commode;Wheelchair - manual      Prior Function Level of Independence: Independent         Comments: Reports independents with ADLs, IADLs, community ambulation and driving.  Pt is questionable historian     Hand Dominance         Extremity/Trunk Assessment   Upper Extremity Assessment Upper Extremity Assessment: Overall WFL for tasks assessed    Lower Extremity Assessment Lower Extremity Assessment: Overall WFL for tasks assessed    Cervical / Trunk Assessment Cervical / Trunk Assessment: Normal  Communication   Communication: HOH  Cognition Arousal/Alertness: Awake/alert Behavior During Therapy: WFL for tasks assessed/performed Overall Cognitive Status: No family/caregiver present to determine baseline cognitive functioning Area of Impairment: Orientation;Awareness;Attention;Problem solving;Memory;Following commands;Safety/judgement                 Orientation Level: Disoriented to;Situation;Time Current Attention Level: Sustained Memory: Decreased short-term memory;Decreased recall of precautions Following Commands: Follows one step commands consistently Safety/Judgement: Decreased awareness of safety;Decreased awareness of deficits Awareness: Emergent Problem Solving: Requires verbal cues;Requires tactile cues General Comments: Major limitations in safety awareness/awareness of deficits - pt unaware of his poor balance and O2 needs; focused on ready to go home      General Comments General comments (skin integrity, edema, etc.): Pt on 5 L rest with sats 95%.  Increased to 6 L for activity and sats down to 86%.  Able to wean back to 5 L rest with sats 95%    Exercises     Assessment/Plan    PT Assessment Patient needs continued PT services  PT Problem List Decreased strength;Decreased mobility;Decreased safety awareness;Decreased activity tolerance;Decreased cognition;Decreased balance;Decreased knowledge of use of DME;Cardiopulmonary status limiting activity;Decreased coordination       PT Treatment Interventions DME instruction;Therapeutic activities;Cognitive remediation;Gait training;Therapeutic exercise;Patient/family education;Balance training;Functional mobility training    PT  Goals (Current goals can be found in the Care Plan section)  Acute Rehab PT Goals Patient Stated Goal: Go home ASAP PT Goal Formulation: With patient Time For Goal Achievement: 09/27/20 Potential to Achieve Goals: Good    Frequency Min 3X/week   Barriers to discharge Other (comment) unknown level of support    Co-evaluation               AM-PAC PT "6 Clicks" Mobility  Outcome Measure Help needed turning from your back to your side while in a flat bed without using bedrails?: A Little Help needed moving from lying on your back to sitting on the side of a flat bed without using bedrails?: A Little Help needed moving to and from a bed to a chair (including a wheelchair)?: A Lot Help needed standing up from a chair using your arms (e.g., wheelchair or bedside chair)?: A Lot Help needed to walk in hospital room?: A Lot Help needed climbing 3-5 steps with a railing? : A Lot 6 Click Score: 14    End of Session Equipment Utilized During Treatment: Gait belt;Oxygen Activity Tolerance: Patient tolerated treatment well Patient left: with call bell/phone within reach;in chair;with chair alarm set Nurse Communication: Mobility status;Other (comment) (assist of 1 pivot; assist of 2 walk) PT Visit Diagnosis: Unsteadiness on feet (R26.81);Muscle weakness (generalized) (M62.81)    Time: 8341-9622 PT Time Calculation (min) (ACUTE ONLY): 25 min   Charges:   PT Evaluation $PT Eval Moderate Complexity: 1 Mod PT Treatments $Therapeutic Activity: 8-22 mins        Eustacia Urbanek, PT  Acute Rehab Services Pager [phone] [facility] [phone]    Rayetta Humphrey 09/13/2020, 5:07 PM

## 2020-09-13 NOTE — Progress Notes (Signed)
Cross-coverage note:   Patient with dementia has been getting delirious overnight, unable to be redirected, removing his oxygen and IV. Will continue delirium precautions, use low-dose Haldol as needed, and use soft restraints for now with frequent reassessments and remove as soon as possible.

## 2020-09-13 NOTE — Progress Notes (Signed)
ANTICOAGULATION CONSULT NOTE - Follow Up Consult  Pharmacy Consult for heparin Indication: r/o PE  Labs: Recent Labs    09/10/20 1340 09/10/20 1354 09/10/20 1542 09/11/20 0509 09/11/20 1035 09/11/20 1927 09/12/20 0110 09/13/20 0103  HGB 14.0   < >  --  11.9*  --   --  12.7* 12.1*  HCT 42.7   < >  --  34.7*  --   --  39.1 36.5*  PLT 102*  --   --  82*  --   --  90* 85*  HEPARINUNFRC  --   --   --   --   --   --   --  0.89*  CREATININE 2.63*  --   --  2.41* 2.17*  --  1.99*  --   TROPONINIHS 45*  --  68*  --   --  78*  --   --    < > = values in this interval not displayed.    Assessment: 87yo male supratherapeutic on heparin with initial dosing for possible PE; no gtt issues or signs of bleeding per RN.  Goal of Therapy:  Heparin level 0.3-0.7 units/ml   Plan:  Will decrease heparin gtt by 1-2 units/kg/hr to 900 units/hr and check level in 8 hours.    Vernard Gambles, PharmD, BCPS  09/13/2020,2:01 AM

## 2020-09-13 NOTE — Progress Notes (Signed)
Bilateral lower extremity venous duplex has been completed. Preliminary results can be found in CV Proc through chart review.   09/13/20 2:02 PM Olen Cordial RVT

## 2020-09-13 NOTE — Progress Notes (Signed)
With increased heart rate in the 140s and above, EKG was done and it shows Afib RVR.  MD notified and in communication with RN.  Scheduled metoprolol was given with no improvement.  Received a one time order for metoprolol and was given.  HR came down to the low hundreds.  Heparin was initiated per order.  Patient became restless and pulled out IV and condom cathe.  MD was notified and an order for ativan received.  He is in bed resting.  Will continue to monitor.

## 2020-09-13 NOTE — Progress Notes (Addendum)
PROGRESS NOTE                                                                                                                                                                                                             Patient Demographics:    Troy [name], is a 86 y.o. male, DOB - June 15, 1933, [id number]  Outpatient Primary MD for the patient is [name], MD   Admit date - 09/10/2020   LOS - 3  Chief Complaint  Patient presents with  . Shortness of Breath       Brief Narrative: Patient is a 85 y.o. male with PMHx of COPD, CAD s/p CABG, HTN, HLD, DM-2, HL, tobacco use-presented with fall/shortness of breath-found to have acute hypoxic respiratory failure due to COVID-19 pneumonia.  Hospital course complicated by development of A. fib with RVR.  COVID-19 vaccinated status:   Significant Events: 12/26>> Admit to [facility] for hypoxia due to COVID-19 pneumonia 12/28>> A. fib with RVR  Significant studies: 12/26>> CT head: No acute abnormality 12/26>> CT C-spine: No acute abnormality 12/26>> CT chest: Emphysematous changes-biapical pleural thickening.  1 cm right lower lobe nodule, 7-8 mm left lower lobe nodule.  Mild dilatation of the ascending aortic aorta measuring 3.7 cm 12/27>> Echo: EF 55-60% 12/29>> chest x-ray: Ill-defined airspace opacity in the lower lung region superimposed on underlying fibrosis. 12/29>> lower extremity Doppler: Pending  COVID-19 medications: Steroids: 12/26>> Remdesivir: 12/26>> Baricitinib: 12/28>>  Antibiotics: None  Microbiology data: None  Procedures: None  Consults: None  DVT prophylaxis: apixaban (ELIQUIS) tablet 2.5 mg Start: 09/13/20 1100 apixaban (ELIQUIS) tablet 2.5 mg     Subjective:    Troy [name] today appears comfortable at rest-on 5-7 L of oxygen.  He appears to be accumulating some secretions and wheezing/gurgling at times.   Assessment  &  Plan :   Acute Hypoxic Resp Failure due to Covid 19 Viral pneumonia: Continues to have severe hypoxemia-on around 7 L of oxygen-some wheezing today-suspect that he may have some microaspiration.  Continue Solu-Medrol/Remdesivir and baricitinib.  Will ask SLP to evaluate-to make sure no overt aspiration.  No signs of volume overload-do not think patient requires diuretics-but will attempt to keep in negative balance.  Fever: afebrile O2 requirements:  SpO2: 90 % O2 Flow Rate (L/min): 7 L/min   COVID-19 Labs: Recent Labs  09/11/20 0508 09/11/20 0913 09/12/20 0110 09/13/20 0103  DDIMER  --  4.12* 3.27* 2.43*  FERRITIN 184 196 203 159  LDH  --  283* 307* 302*  CRP 8.8*  --  12.4* 18.9*       Component Value Date/Time   BNP 95.5 09/10/2020 1340    Recent Labs  Lab 09/11/20 0508  PROCALCITON 4.51    Lab Results  Component Value Date   SARSCOV2NAA POSITIVE (A) 09/10/2020    Prone/Incentive Spirometry: encouraged incentive spirometry use 3-4/hour.  Atrial fibrillation with RVR: Developed A. fib last night-rate controlled this morning with metoprolol.  Transition from IV heparin to Eliquis.  CHA2DS2-VASc of around 4.  COPD: Some mild wheezing today-continue bronchodilators-already on steroids.  Chronic diastolic heart failure: No signs of volume overload-monitor closely-May require diuretics over the next few days.  AKI on CKD stage IIIa: AKI hemodynamically mediated-improving with supportive care.  CAD-s/p CABG: No anginal symptoms-continue statin/beta-blocker-stop ASA patient will be on anticoagulation.  HTN: BP controlled-continue metoprolol and Imdur  HLD: Continue statin  DM-2: CBGs controlled-continue Lantus 10 units daily and SSI  Recent Labs    09/12/20 2133 09/13/20 0740 09/13/20 1144  GLUCAP 108* 118* 107*    Chronic thrombocytopenia: Some mild worsening due to COVID-19-follow closely.  GERD: Continue PPI  Tobacco abuse: Continue transdermal  nicotine  Dementia: Appears to be mild-continue supportive care-expect some amount of delirium during this hospital stay.  Remains on Aricept, Namenda, trazodone and Zoloft  Lung nodule: Seen incidentally on CT chest-stable for outpatient follow-up by PCP   Palliative care: DNR in place-worsening hypoxemia in the past few days-concern for aspiration-has significant pre-existing COPD/fibrosis of his lungs-hopefully he will slowly improve-however he is not a good candidate for aggressive medical care.  Spoke with patient's son Troy [name] over the phone on 12/29-he is aware that if patient worsens significantly-we may need to pursue hospice care.   ABG:    Component Value Date/Time   HCO3 15.8 (L) 09/10/2020 1354   TCO2 17 (L) 09/10/2020 1354   ACIDBASEDEF 12.0 (H) 09/10/2020 1354   O2SAT 51.0 09/10/2020 1354    Vent Settings: N/A    Condition - Extremely Guarded  Family Communication  :  Son Troy [name] [phone]) updated over the phone on 12/29  Code Status :  DNR  Diet :  Diet Order            Diet heart healthy/carb modified Room service appropriate? Yes; Fluid consistency: Thin  Diet effective now                  Disposition Plan  :   Status is: Inpatient  Remains inpatient appropriate because:Inpatient level of care appropriate due to severity of illness   Dispo: The patient is from: Home              Anticipated d/c is to: Home              Anticipated d/c date is: > 3 days              Patient currently is not medically stable to d/c.     Barriers to discharge: Hypoxia requiring O2 supplementation/complete 5 days of IV Remdesivir  Antimicorbials  :    Anti-infectives (From admission, onward)   Start     Dose/Rate Route Frequency Ordered Stop   09/11/20 1000  remdesivir 100 mg in sodium chloride 0.9 % 100 mL IVPB       "Followed by" Linked Group  Details   100 mg 200 mL/hr over 30 Minutes Intravenous Daily 09/10/20 1533 09/15/20 0959   09/10/20 1600  remdesivir  200 mg in sodium chloride 0.9% 250 mL IVPB       "Followed by" Linked Group Details   200 mg 580 mL/hr over 30 Minutes Intravenous Once 09/10/20 1533 09/10/20 1819      Inpatient Medications  Scheduled Meds: . apixaban  2.5 mg Oral BID  . vitamin C  500 mg Oral Daily  . aspirin EC  81 mg Oral Daily  . baricitinib  2 mg Oral Daily  . donepezil  10 mg Oral QHS  . fluticasone furoate-vilanterol  1 puff Inhalation Daily  . insulin aspart  0-20 Units Subcutaneous TID WC  . insulin glargine  10 Units Subcutaneous QHS  . Ipratropium-Albuterol  1 puff Inhalation TID  . isosorbide mononitrate  30 mg Oral Daily  . memantine  10 mg Oral BID  . methylPREDNISolone (SOLU-MEDROL) injection  60 mg Intravenous Q24H  . metoprolol tartrate  25 mg Oral q morning - 10a   And  . metoprolol tartrate  12.5 mg Oral QHS  . nicotine  14 mg Transdermal Daily  . pantoprazole  80 mg Oral Daily  . senna  1 tablet Oral BID  . sertraline  50 mg Oral Daily  . simvastatin  40 mg Oral QHS  . vitamin B-12  500 mcg Oral Daily  . zinc sulfate  220 mg Oral Daily   Continuous Infusions: . dextrose 5 % and 0.9% NaCl 10 mL/hr at 09/13/20 0725  . remdesivir 100 mg in NS 100 mL 100 mg (09/13/20 0849)   PRN Meds:.acetaminophen, guaiFENesin-dextromethorphan, traZODone   Time Spent in minutes  35     See all Orders from today for further details   Oren Binet M.D on 09/13/2020 at 12:14 PM  To page go to [email or web address] - use universal password  [facility] -  Office  [phone]    Objective:   Vitals:   09/13/20 0500 09/13/20 0735 09/13/20 0853 09/13/20 1035  BP: 121/70 (!) 120/94  101/74  Pulse: (!) 125 (!) 110 (!) 117 (!) 102  Resp: (!) 24 (!) 21 (!) 25 (!) 21  Temp: 97.6 F (36.4 C) 97.8 F (36.6 C)  98.7 F (37.1 C)  TempSrc: Axillary Axillary  Axillary  SpO2: 100% 97% 93% 90%  Weight:      Height:        Wt Readings from Last 3 Encounters:  09/12/20 69.9 kg  04/10/20 71.7 kg   01/24/20 72.4 kg     Intake/Output Summary (Last 24 hours) at 09/13/2020 1214 Last data filed at 09/13/2020 0900 Gross per 24 hour  Intake 1349.53 ml  Output --  Net 1349.53 ml     Physical Exam Gen Exam:Alert awake-not in any distress. HEENT:atraumatic, normocephalic Chest: No wheezing today-moving air-decreased air entry at bases. CVS:S1S2 regular Abdomen:soft non tender, non distended Extremities:no edema Neurology: Non focal Skin: no rash   Data Review:    CBC Recent Labs  Lab 09/10/20 1340 09/10/20 1354 09/11/20 0509 09/12/20 0110 09/13/20 0103  WBC 9.1  --  10.2 8.5 7.6  HGB 14.0 14.3 11.9* 12.7* 12.1*  HCT 42.7 42.0 34.7* 39.1 36.5*  PLT 102*  --  82* 90* 85*  MCV 96.0  --  92.5 93.3 92.2  MCH 31.5  --  31.7 30.3 30.6  MCHC 32.8  --  34.3 32.5 33.2  RDW 14.4  --  14.5 14.4 14.4  LYMPHSABS 0.5*  --  0.9 0.6* 0.7  MONOABS 0.5  --  0.3 0.3 0.3  EOSABS 0.0  --  0.0 0.0 0.0  BASOSABS 0.0  --  0.0 0.0 0.0    Chemistries  Recent Labs  Lab 09/10/20 1340 09/10/20 1354 09/11/20 0509 09/11/20 0913 09/11/20 1035 09/12/20 0110 09/13/20 0103  NA 141 143 135  --  141 142 140  K 5.3* 5.1 5.7*  --  4.7 4.8 4.9  CL 109  --  109  --  114* 112* 114*  CO2 13*  --  15*  --  16* 20* 16*  GLUCOSE 183*  --  130*  --  124* 117* 113*  BUN 58*  --  66*  --  62* 61* 51*  CREATININE 2.63*  --  2.41*  --  2.17* 1.99* 1.57*  CALCIUM 9.2  --  8.2*  --  8.3* 8.5* 8.4*  MG  --   --   --  2.1  --  2.1 2.1  AST 65*  --  101*  --   --  106* 83*  ALT 41  --  35  --   --  43 42  ALKPHOS 120  --  86  --   --  85 69  BILITOT 0.8  --  1.2  --   --  0.7 0.5   ------------------------------------------------------------------------------------------------------------------ No results for input(s): CHOL, HDL, LDLCALC, TRIG, CHOLHDL, LDLDIRECT in the last 72 hours.  Lab Results  Component Value Date   HGBA1C 6.1 (H) 09/11/2020    ------------------------------------------------------------------------------------------------------------------ No results for input(s): TSH, T4TOTAL, T3FREE, THYROIDAB in the last 72 hours.  Invalid input(s): FREET3 ------------------------------------------------------------------------------------------------------------------ Recent Labs    09/12/20 0110 09/13/20 0103  FERRITIN 203 159    Coagulation profile No results for input(s): INR, PROTIME in the last 168 hours.  Recent Labs    09/12/20 0110 09/13/20 0103  DDIMER 3.27* 2.43*    Cardiac Enzymes No results for input(s): CKMB, TROPONINI, MYOGLOBIN in the last 168 hours.  Invalid input(s): CK ------------------------------------------------------------------------------------------------------------------    Component Value Date/Time   BNP 95.5 09/10/2020 1340    Micro Results Recent Results (from the past 240 hour(s))  Resp Panel by RT-PCR (Flu A&B, Covid) Nasopharyngeal Swab     Status: Abnormal   Collection Time: 09/10/20  1:27 PM   Specimen: Nasopharyngeal Swab; Nasopharyngeal(NP) swabs in vial transport medium  Result Value Ref Range Status   SARS Coronavirus 2 by RT PCR POSITIVE (A) NEGATIVE Final    Comment: RESULT CALLED TO, READ BACK BY AND VERIFIED WITH: JOE B. [id number] (NOTE) SARS-CoV-2 target nucleic acids are DETECTED.  The SARS-CoV-2 RNA is generally detectable in upper respiratory specimens during the acute phase of infection. Positive results are indicative of the presence of the identified virus, but do not rule out bacterial infection or co-infection with other pathogens not detected by the test. Clinical correlation with patient history and other diagnostic information is necessary to determine patient infection status. The expected result is Negative.  Fact Sheet for Patients: EntrepreneurPulse.com.au  Fact Sheet for Healthcare  Providers: IncredibleEmployment.be  This test is not yet approved or cleared by the [place] FDA and  has been authorized for detection and/or diagnosis of SARS-CoV-2 by FDA under an Emergency Use Authorization (EUA).  This EUA will remain in effect (meaning this test can be used) for the  duration of  the COVID-19 declaration under Section 564(b)(1) of the Act, 21  U.S.C. section 360bbb-3(b)(1), unless the authorization is terminated or revoked sooner.     Influenza A by PCR NEGATIVE NEGATIVE Final   Influenza B by PCR NEGATIVE NEGATIVE Final    Comment: (NOTE) The Xpert Xpress SARS-CoV-2/FLU/RSV plus assay is intended as an aid in the diagnosis of influenza from Nasopharyngeal swab specimens and should not be used as a sole basis for treatment. Nasal washings and aspirates are unacceptable for Xpert Xpress SARS-CoV-2/FLU/RSV testing.  Fact Sheet for Patients: BloggerCourse.com  Fact Sheet for Healthcare Providers: SeriousBroker.it  This test is not yet approved or cleared by the [place] FDA and has been authorized for detection and/or diagnosis of SARS-CoV-2 by FDA under an Emergency Use Authorization (EUA). This EUA will remain in effect (meaning this test can be used) for the duration of the COVID-19 declaration under Section 564(b)(1) of the Act, 21 U.S.C. section 360bbb-3(b)(1), unless the authorization is terminated or revoked.  Performed at [facility], [address], [place], [place] [address]     Radiology Reports DG Chest 1 View  Result Date: 09/10/2020 CLINICAL DATA:  Shortness of breath EXAM: CHEST  1 VIEW COMPARISON:  06/30/2018 FINDINGS: Cardiac shadow is within normal limits. Postsurgical changes are seen. The lungs are hyperinflated. No focal infiltrate or sizable effusion is seen. No bony abnormality is noted. IMPRESSION: COPD without acute abnormality. Electronically  Signed   By: Alcide Clever [name]   On: 09/10/2020 13:45   CT Head Wo Contrast  Result Date: 09/10/2020 CLINICAL DATA:  Recent head trauma, initial encounter EXAM: CT HEAD WITHOUT CONTRAST TECHNIQUE: Contiguous axial images were obtained from the base of the skull through the vertex without intravenous contrast. COMPARISON:  None. FINDINGS: Brain: No evidence of acute infarction, hemorrhage, hydrocephalus, extra-axial collection or mass lesion/mass effect. Chronic atrophic and ischemic changes are noted. Vascular: No hyperdense vessel or unexpected calcification. Skull: Normal. Negative for fracture or focal lesion. Sinuses/Orbits: No acute finding. Other: None. IMPRESSION: Chronic atrophic and ischemic changes without acute abnormality. Electronically Signed   By: Alcide Clever [name]   On: 09/10/2020 14:28   CT CHEST WO CONTRAST  Result Date: 09/10/2020 CLINICAL DATA:  Respiratory failure.  COVID-19 positive.  Hypoxia. EXAM: CT CHEST WITHOUT CONTRAST TECHNIQUE: Multidetector CT imaging of the chest was performed following the standard protocol without IV contrast. COMPARISON:  None. FINDINGS: Cardiovascular: Heart is normal size. Patient is post median sternotomy/CABG. Mild ectasia of the ascending thoracic aorta measuring 3.7 cm. There is calcified plaque throughout the thoracic aorta. Remaining vascular structures are unremarkable. Mediastinum/Nodes: No mediastinal or hilar adenopathy. Remaining mediastinal structures are normal. Lungs/Pleura: Lungs are hyperexpanded with centrilobular emphysematous disease. Biapical pleural thickening right worse than left. There is a 1 cm nodule opacity over the posteromedial right lower lobe with subtle adjacent possible hazy nodular opacification 7-8 mm nodule over the more superior aspect of the left lower lobe. Mild linear density with focal nodularity over the right middle lobe medially. No effusion. Airways are normal. Upper Abdomen: Calcified plaque over the  abdominal aorta. No acute findings Musculoskeletal: Degenerative change of the spine. IMPRESSION: 1. Emphysematous disease with biapical pleural thickening right worse than left. 1 cm nodule opacity over the posteromedial right lower lobe with subtle adjacent hazy nodular opacification. 7-8 mm nodule over the more superior aspect of the left lower lobe. Findings may be seen with atypical infection versus inflammatory process and less likely underlying neoplastic/metastatic disease. Recommend follow-up CT 6-8 weeks. 2. Emphysema and aortic atherosclerosis. Mild dilatation  of the ascending thoracic aorta measuring 3.7 cm. Recommend annual imaging followup by CTA or MRA. This recommendation follows 2010 ACCF/AHA/AATS/ACR/ASA/SCA/SCAI/SIR/STS/SVM Guidelines for the Diagnosis and Management of Patients with Thoracic Aortic Disease. Circulation.2010; [id number]. Aortic aneurysm NOS (ICD10-I71.9). Aortic Atherosclerosis (ICD10-I70.0) and Emphysema (ICD10-J43.9). Electronically Signed   By: Marin Olp [name]   On: 09/10/2020 18:08   CT Cervical Spine Wo Contrast  Result Date: 09/10/2020 CLINICAL DATA:  Recent trauma with neck pain, initial encounter EXAM: CT CERVICAL SPINE WITHOUT CONTRAST TECHNIQUE: Multidetector CT imaging of the cervical spine was performed without intravenous contrast. Multiplanar CT image reconstructions were also generated. COMPARISON:  None. FINDINGS: Alignment: Within normal limits. Skull base and vertebrae: 7 cervical segments are well visualized. Vertebral body height is well maintained. Multilevel osteophytic changes and facet hypertrophic changes are seen. No acute fracture or acute facet abnormality is noted. The odontoid is within normal limits. Soft tissues and spinal canal: Surrounding soft tissue structures are within normal limits with the exception of vascular calcifications. Upper chest: Pleural and parenchymal scarring is noted in the apices bilaterally. Other: None  IMPRESSION: Multilevel degenerative change without acute abnormality. Electronically Signed   By: Inez Catalina [name]   On: 09/10/2020 14:32   DG Chest Port 1 View  Result Date: 09/13/2020 CLINICAL DATA:  Shortness of breath.  Reported COVID-19 positive EXAM: PORTABLE CHEST 1 VIEW COMPARISON:  Chest radiograph and chest CT September 10, 2020 FINDINGS: There are areas of underlying fibrotic change. There is ill-defined airspace opacity superimposed on apparent fibrotic change in the lower lobe regions. No consolidation. The heart size is normal. The pulmonary vascularity is stable and appears consistent with underlying emphysematous change. Patient is status post coronary artery bypass grafting. No adenopathy. There is aortic atherosclerosis. No appreciable bone lesions. IMPRESSION: The appearance is felt to be indicative of ill-defined airspace opacity in the lower lung regions superimposed on underlying fibrotic type change. Underlying emphysematous change noted with decreased vascularity in the upper lobes, stable. Stable cardiac silhouette. Status post coronary artery bypass grafting. Aortic Atherosclerosis (ICD10-I70.0) and Emphysema (ICD10-J43.9). Electronically Signed   By: Lowella Grip III [name]   On: 09/13/2020 08:46   ECHOCARDIOGRAM COMPLETE  Result Date: 09/11/2020    ECHOCARDIOGRAM REPORT   Patient Name:   ATEM MUSCARI [facility] Date of Exam: 09/11/2020 Medical Rec #:  [id number]        Height:       68.0 in Accession #:    [id number]       Weight:       155.0 lb Date of Birth:  1933/01/24       BSA:          1.834 m Patient Age:    16 years         BP:           135/71 mmHg Patient Gender: M                HR:           77 bpm. Exam Location:  Inpatient Procedure: 2D Echo, Cardiac Doppler and Color Doppler Indications:    I50.40* Unspecified combined systolic (congestive) and diastolic                 (congestive) heart failure  History:        Patient has prior history of Echocardiogram  examinations, most                 recent 01/02/2013. CHF,  CAD, Abnormal ECG, COPD, Arrythmias:SVT,                 Signs/Symptoms:Shortness of Breath, Dyspnea, Altered Mental                 Status and Alzheimer's; Risk Factors:Dyslipidemia. Covid                 positive.  Sonographer:    Sheralyn Boatman RDCS Referring Phys: 61 MICHAEL E NORINS  Sonographer Comments: Technically difficult study due to poor echo windows. Patient could not turn. Study interrupted to answer phone for endo. IMPRESSIONS  1. Left ventricular ejection fraction, by estimation, is 55 to 60%. The left ventricle has normal function. Abnormal sepal motion, no other regional wall motion abnormalities noted. There is mild concentric left ventricular hypertrophy. Left ventricular  diastolic parameters are indeterminate.  2. Right ventricular systolic function is normal. The right ventricular size is normal. Mildly increased right ventricular wall thickness.  3. The mitral valve is grossly normal. No evidence of mitral valve regurgitation.  4. The aortic valve was not well visualized. There is mild calcification of the aortic valve. Aortic valve regurgitation is not visualized. No aortic stenosis is present.  5. Aortic dilatation noted. There is mild dilatation of the aortic root, measuring 40 mm.  6. The inferior vena cava is normal in size with greater than 50% respiratory variability, suggesting right atrial pressure of 3 mmHg. Comparison(s): A prior study was performed on 12/23/2012. Prior images reviewed side by side. Compared to prior, increase in aortic root size. FINDINGS  Left Ventricle: Left ventricular ejection fraction, by estimation, is 55 to 60%. The left ventricle has normal function. The left ventricle has no regional wall motion abnormalities. The left ventricular internal cavity size was normal in size. There is  mild concentric left ventricular hypertrophy. Left ventricular diastolic parameters are indeterminate. Right Ventricle: The  right ventricular size is normal. Mildly increased right ventricular wall thickness. Right ventricular systolic function is normal. Left Atrium: Left atrial size was normal in size. Right Atrium: Right atrial size was normal in size. Pericardium: The pericardium was not well visualized. Mitral Valve: The mitral valve is grossly normal. No evidence of mitral valve regurgitation. Tricuspid Valve: The tricuspid valve is grossly normal. Tricuspid valve regurgitation is not demonstrated. Aortic Valve: The aortic valve was not well visualized. There is mild calcification of the aortic valve. Aortic valve regurgitation is not visualized. No aortic stenosis is present. Pulmonic Valve: The pulmonic valve was not well visualized. Pulmonic valve regurgitation is not visualized. Aorta: Aortic dilatation noted. There is mild dilatation of the aortic root, measuring 40 mm. Venous: The inferior vena cava is normal in size with greater than 50% respiratory variability, suggesting right atrial pressure of 3 mmHg. IAS/Shunts: The atrial septum is grossly normal.  LEFT VENTRICLE PLAX 2D LVIDd:         4.05 cm     Diastology LVIDs:         2.63 cm     LV e' medial:    7.29 cm/s LV PW:         1.10 cm     LV E/e' medial:  9.1 LV IVS:        1.10 cm     LV e' lateral:   8.27 cm/s LVOT diam:     2.30 cm     LV E/e' lateral: 8.0 LV SV:         94 LV SV Index:  51 LVOT Area:     4.15 cm  LV Volumes (MOD) LV vol d, MOD A2C: 32.1 ml LV vol d, MOD A4C: 54.6 ml LV vol s, MOD A2C: 10.3 ml LV vol s, MOD A4C: 12.4 ml LV SV MOD A2C:     21.8 ml LV SV MOD A4C:     54.6 ml LV SV MOD BP:      32.1 ml RIGHT VENTRICLE             IVC RV S prime:     14.50 cm/s  IVC diam: 1.80 cm TAPSE (M-mode): 1.7 cm LEFT ATRIUM           Index      RIGHT ATRIUM          Index LA diam:      2.40 cm 1.31 cm/m RA Area:     9.59 cm LA Vol (A2C): 14.7 ml 8.02 ml/m RA Volume:   18.60 ml 10.14 ml/m LA Vol (A4C): 8.7 ml  4.72 ml/m  AORTIC VALVE LVOT Vmax:   116.00 cm/s  LVOT Vmean:  70.400 cm/s LVOT VTI:    0.227 m  AORTA Ao Root diam: 4.00 cm MITRAL VALVE MV Area (PHT): 2.20 cm     SHUNTS MV Decel Time: 345 msec     Systemic VTI:  0.23 m MV E velocity: 66.20 cm/s   Systemic Diam: 2.30 cm MV A velocity: 114.00 cm/s MV E/A ratio:  0.58 Rudean Haskell MD Electronically signed by Rudean Haskell MD Signature Date/Time: 09/11/2020/1:12:15 PM    Final

## 2020-09-14 ENCOUNTER — Telehealth: Payer: Self-pay | Admitting: Family Medicine

## 2020-09-14 ENCOUNTER — Other Ambulatory Visit: Payer: Self-pay

## 2020-09-14 ENCOUNTER — Inpatient Hospital Stay (HOSPITAL_COMMUNITY): Payer: Medicare Other

## 2020-09-14 DIAGNOSIS — J431 Panlobular emphysema: Secondary | ICD-10-CM | POA: Diagnosis not present

## 2020-09-14 DIAGNOSIS — I5032 Chronic diastolic (congestive) heart failure: Secondary | ICD-10-CM | POA: Diagnosis not present

## 2020-09-14 DIAGNOSIS — U071 COVID-19: Secondary | ICD-10-CM | POA: Diagnosis not present

## 2020-09-14 DIAGNOSIS — N179 Acute kidney failure, unspecified: Secondary | ICD-10-CM | POA: Diagnosis not present

## 2020-09-14 LAB — COMPREHENSIVE METABOLIC PANEL
ALT: 40 U/L (ref 0–44)
AST: 61 U/L — ABNORMAL HIGH (ref 15–41)
Albumin: 2.7 g/dL — ABNORMAL LOW (ref 3.5–5.0)
Alkaline Phosphatase: 67 U/L (ref 38–126)
Anion gap: 12 (ref 5–15)
BUN: 60 mg/dL — ABNORMAL HIGH (ref 8–23)
CO2: 17 mmol/L — ABNORMAL LOW (ref 22–32)
Calcium: 8.6 mg/dL — ABNORMAL LOW (ref 8.9–10.3)
Chloride: 112 mmol/L — ABNORMAL HIGH (ref 98–111)
Creatinine, Ser: 1.85 mg/dL — ABNORMAL HIGH (ref 0.61–1.24)
GFR, Estimated: 35 mL/min — ABNORMAL LOW (ref >60)
Glucose, Bld: 113 mg/dL — ABNORMAL HIGH (ref 70–99)
Potassium: 4.8 mmol/L (ref 3.5–5.1)
Sodium: 141 mmol/L (ref 135–145)
Total Bilirubin: 0.6 mg/dL (ref 0.3–1.2)
Total Protein: 6.3 g/dL — ABNORMAL LOW (ref 6.5–8.1)

## 2020-09-14 LAB — CBC WITH DIFFERENTIAL/PLATELET
Abs Immature Granulocytes: 0.04 10*3/uL (ref 0.00–0.07)
Basophils Absolute: 0 10*3/uL (ref 0.0–0.1)
Basophils Relative: 0 %
Eosinophils Absolute: 0 10*3/uL (ref 0.0–0.5)
Eosinophils Relative: 0 %
HCT: 36.7 % — ABNORMAL LOW (ref 39.0–52.0)
Hemoglobin: 12 g/dL — ABNORMAL LOW (ref 13.0–17.0)
Immature Granulocytes: 1 %
Lymphocytes Relative: 11 %
Lymphs Abs: 0.8 10*3/uL (ref 0.7–4.0)
MCH: 30.2 pg (ref 26.0–34.0)
MCHC: 32.7 g/dL (ref 30.0–36.0)
MCV: 92.4 fL (ref 80.0–100.0)
Monocytes Absolute: 0.3 10*3/uL (ref 0.1–1.0)
Monocytes Relative: 4 %
Neutro Abs: 6.6 10*3/uL (ref 1.7–7.7)
Neutrophils Relative %: 84 %
Platelets: 107 10*3/uL — ABNORMAL LOW (ref 150–400)
RBC: 3.97 MIL/uL — ABNORMAL LOW (ref 4.22–5.81)
RDW: 14.6 % (ref 11.5–15.5)
WBC: 7.8 10*3/uL (ref 4.0–10.5)
nRBC: 0 % (ref 0.0–0.2)

## 2020-09-14 LAB — MAGNESIUM: Magnesium: 2.2 mg/dL (ref 1.7–2.4)

## 2020-09-14 LAB — FERRITIN: Ferritin: 268 ng/mL (ref 24–336)

## 2020-09-14 LAB — GLUCOSE, CAPILLARY
Glucose-Capillary: 87 mg/dL (ref 70–99)
Glucose-Capillary: 135 mg/dL — ABNORMAL HIGH (ref 70–99)
Glucose-Capillary: 99 mg/dL (ref 70–99)
Glucose-Capillary: 159 mg/dL — ABNORMAL HIGH (ref 70–99)

## 2020-09-14 LAB — LACTATE DEHYDROGENASE: LDH: 322 U/L — ABNORMAL HIGH (ref 98–192)

## 2020-09-14 LAB — C-REACTIVE PROTEIN: CRP: 14.4 mg/dL — ABNORMAL HIGH (ref <1.0)

## 2020-09-14 LAB — PROCALCITONIN: Procalcitonin: 1.73 ng/mL

## 2020-09-14 LAB — D-DIMER, QUANTITATIVE: D-Dimer, Quant: 1.87 ug/mL-FEU — ABNORMAL HIGH (ref 0.00–0.50)

## 2020-09-14 LAB — BRAIN NATRIURETIC PEPTIDE: B Natriuretic Peptide: 447.7 pg/mL — ABNORMAL HIGH (ref 0.0–100.0)

## 2020-09-14 MED ORDER — UMECLIDINIUM BROMIDE 62.5 MCG/INH IN AEPB
1.0000 | INHALATION_SPRAY | Freq: Every day | RESPIRATORY_TRACT | Status: DC
  Filled 2020-09-14: qty 7

## 2020-09-14 MED ORDER — INSULIN GLARGINE 100 UNIT/ML ~~LOC~~ SOLN
6.0000 [IU] | Freq: Every day | SUBCUTANEOUS | Status: DC
  Administered 2020-09-14 – 2020-09-16 (×3): 6 [IU] via SUBCUTANEOUS
  Filled 2020-09-14 (×4): qty 0.06

## 2020-09-14 MED ORDER — RESOURCE THICKENUP CLEAR PO POWD
ORAL | Status: DC | PRN
  Filled 2020-09-14: qty 125

## 2020-09-14 MED ORDER — UMECLIDINIUM BROMIDE 62.5 MCG/INH IN AEPB
1.0000 | INHALATION_SPRAY | Freq: Every day | RESPIRATORY_TRACT | Status: DC
  Administered 2020-09-15 – 2020-09-19 (×5): 1 via RESPIRATORY_TRACT
  Filled 2020-09-14: qty 7

## 2020-09-14 MED ORDER — METHYLPREDNISOLONE SODIUM SUCC 40 MG IJ SOLR
40.0000 mg | INTRAMUSCULAR | Status: DC
  Administered 2020-09-15 – 2020-09-18 (×4): 40 mg via INTRAVENOUS
  Filled 2020-09-14 (×4): qty 1

## 2020-09-14 MED ORDER — PANTOPRAZOLE SODIUM 40 MG PO TBEC
40.0000 mg | DELAYED_RELEASE_TABLET | Freq: Two times a day (BID) | ORAL | Status: DC
  Administered 2020-09-14 – 2020-09-19 (×12): 40 mg via ORAL
  Filled 2020-09-14 (×12): qty 1

## 2020-09-14 MED ORDER — SODIUM CHLORIDE 0.9 % IV SOLN
3.0000 g | Freq: Two times a day (BID) | INTRAVENOUS | Status: AC
  Administered 2020-09-14 – 2020-09-19 (×10): 3 g via INTRAVENOUS
  Filled 2020-09-14: qty 8
  Filled 2020-09-14: qty 3
  Filled 2020-09-14 (×2): qty 8
  Filled 2020-09-14 (×2): qty 3
  Filled 2020-09-14 (×3): qty 8
  Filled 2020-09-14: qty 3

## 2020-09-14 MED ORDER — LEVALBUTEROL TARTRATE 45 MCG/ACT IN AERO
4.0000 | INHALATION_SPRAY | Freq: Four times a day (QID) | RESPIRATORY_TRACT | Status: DC
  Administered 2020-09-14 – 2020-09-15 (×4): 4 via RESPIRATORY_TRACT
  Filled 2020-09-14: qty 15

## 2020-09-14 MED ORDER — METOPROLOL TARTRATE 25 MG PO TABS
25.0000 mg | ORAL_TABLET | Freq: Three times a day (TID) | ORAL | Status: DC
  Administered 2020-09-14 – 2020-09-19 (×18): 25 mg via ORAL
  Filled 2020-09-14 (×18): qty 1

## 2020-09-14 MED ORDER — METOPROLOL TARTRATE 5 MG/5ML IV SOLN
5.0000 mg | INTRAVENOUS | Status: DC | PRN
  Administered 2020-09-14: 07:00:00 5 mg via INTRAVENOUS
  Filled 2020-09-14: qty 5

## 2020-09-14 NOTE — Telephone Encounter (Signed)
Pt son Dorinda Hill called and stated that his father is in the hospital with covid

## 2020-09-14 NOTE — NC FL2 (Signed)
[place] LEVEL OF CARE SCREENING TOOL     IDENTIFICATION  Patient Name: Troy [name] Birthdate: 1933-05-01 Sex: male Admission Date (Current Location): 09/10/2020  [facility] and [place] Number:  Herbalist and Address:  [facility] [place], [place] [address] [place], [place] [address]      Provider Number: [id number]  Attending Physician Name and Address:  Jonetta Osgood, MD  Relative Name and Phone Number:  [name], son, [phone]    Current Level of Care: Hospital Recommended Level of Care: [place] Prior Approval Number:    Date Approved/Denied:   PASRR Number: [id number]  Discharge Plan: SNF    Current Diagnoses: Patient Active Problem List   Diagnosis Date Noted  . Severe hypoxemia 09/10/2020  . Acute respiratory disease due to COVID-19 virus 09/10/2020  . Acute respiratory failure with hypoxemia ([place]) 09/10/2020  . AKI (acute kidney injury) ([place]) 09/10/2020  . Chronic diastolic CHF (congestive heart failure) ([place]) 09/10/2020  . Cerebral vascular disease 04/10/2020  . Diabetic cataract of both eyes ([place]) 01/24/2020  . Vitamin B12 deficiency 08/03/2019  . History of stroke 08/03/2019  . Pseudodementia 10/01/2018  . Sensorineural hearing loss (SNHL) of both ears 12/31/2017  . Candidal intertrigo 12/22/2017  . Left serous otitis media 07/04/2017  . Hypertension associated with diabetes ([place]) 05/22/2017  . Eustachian tube dysfunction, left 04/04/2017  . Dry skin dermatitis 12/02/2016  . Health maintenance examination 10/08/2016  . Advanced care planning/counseling discussion 10/08/2016  . Thyromegaly 10/08/2016  . Anemia 10/08/2016  . Aorto-iliac atherosclerosis ([place]) 08/17/2015  . Bleeding hemorrhoid 06/13/2015  . Dementia without behavioral disturbance ([place]) 06/13/2015  . Orthostatic dizziness 08/01/2014  . Paresthesia of foot, bilateral 08/01/2014  . Supraventricular tachycardia ([place])  11/24/2012  . CAD (coronary artery disease) 11/24/2012  . COPD exacerbation ([place]) 11/10/2012  . Lumbosacral radiculopathy at L5 12/23/2011  . Thrombocytopenia ([place])   . Barrett's esophagus   . Skin lesion of back 09/16/2011  . PUD (peptic ulcer disease) 05/16/2011  . Medicare annual wellness visit, subsequent 04/30/2011  . COPD (chronic obstructive pulmonary disease) ([place]) 12/26/2010  . Diabetes mellitus type 2 with complications ([place]) 98/92/1194  . Chronic idiopathic constipation 07/17/2010  . Hyperlipidemia associated with type 2 diabetes mellitus ([place]) 05/13/2009  . Tobacco use 05/13/2009    Orientation RESPIRATION BLADDER Height & Weight     Self  O2 (Nasal cannula 4-5L) Incontinent Weight: 154 lb 1.6 oz (69.9 kg) Height:  5\' 8"  (172.7 cm)  BEHAVIORAL SYMPTOMS/MOOD NEUROLOGICAL BOWEL NUTRITION STATUS      Continent Diet (Please see DC Summary)  AMBULATORY STATUS COMMUNICATION OF NEEDS Skin   Extensive Assist Verbally Normal                       Personal Care Assistance Level of Assistance  Bathing,Feeding,Dressing Bathing Assistance: Maximum assistance Feeding assistance: Limited assistance Dressing Assistance: Limited assistance     Functional Limitations Info             SPECIAL CARE FACTORS FREQUENCY  PT (By licensed PT),OT (By licensed OT)     PT Frequency: 5x/week OT Frequency: 5x/week            Contractures Contractures Info: Not present    Additional Factors Info  Code Status,Allergies,Insulin Sliding Scale,Psychotropic,Isolation Precautions Code Status Info: DNR Allergies Info: Codeine, Cyclobenzaprine Hcl, Hydrocodone, Tramadol, Valium Psychotropic Info: Zoloft Insulin Sliding Scale Info: See DC Summary Isolation Precautions Info: COVID+ 09/10/20  Current Medications (09/14/2020):  This is the current hospital active medication list Current Facility-Administered Medications  Medication Dose Route Frequency Provider Last Rate Last  Admin  . acetaminophen (TYLENOL) tablet 650 mg  650 mg Oral Q6H PRN Drema Dallas, MD   650 mg at 09/11/20 1936  . albuterol (VENTOLIN HFA) 108 (90 Base) MCG/ACT inhaler 2 puff  2 puff Inhalation Q4H PRN Ghimire, Werner Lean, MD      . albuterol (VENTOLIN HFA) 108 (90 Base) MCG/ACT inhaler 4 puff  4 puff Inhalation Q6H Ghimire, Werner Lean, MD   4 puff at 09/14/20 0731  . apixaban (ELIQUIS) tablet 2.5 mg  2.5 mg Oral BID Pham, Minh Q, RPH-CPP   2.5 mg at 09/14/20 0730  . ascorbic acid (VITAMIN C) tablet 500 mg  500 mg Oral Daily Drema Dallas, MD   500 mg at 09/14/20 8413  . baricitinib (OLUMIANT) tablet 2 mg  2 mg Oral Daily Drema Dallas, MD   2 mg at 09/14/20 0908  . dextrose 5 %-0.9 % sodium chloride infusion   Intravenous Continuous Maretta Bees, MD 10 mL/hr at 09/13/20 0725 Rate Change at 09/13/20 0725  . donepezil (ARICEPT) tablet 10 mg  10 mg Oral QHS Norins, Rosalyn Gess, MD   10 mg at 09/13/20 2204  . fluticasone furoate-vilanterol (BREO ELLIPTA) 100-25 MCG/INH 1 puff  1 puff Inhalation Daily Norins, Rosalyn Gess, MD   1 puff at 09/14/20 [phone]  . guaiFENesin-dextromethorphan (ROBITUSSIN DM) 100-10 MG/5ML syrup 10 mL  10 mL Oral Q4H PRN Drema Dallas, MD   10 mL at 09/11/20 1439  . haloperidol lactate (HALDOL) injection 1 mg  1 mg Intravenous Q6H PRN Opyd, Lavone Neri, MD   1 mg at 09/14/20 0543  . insulin aspart (novoLOG) injection 0-20 Units  0-20 Units Subcutaneous TID WC Drema Dallas, MD   3 Units at 09/12/20 1252  . insulin glargine (LANTUS) injection 10 Units  10 Units Subcutaneous QHS Drema Dallas, MD   10 Units at 09/13/20 2205  . memantine (NAMENDA) tablet 10 mg  10 mg Oral BID Jacques Navy, MD   10 mg at 09/14/20 0908  . methylPREDNISolone sodium succinate (SOLU-MEDROL) 125 mg/2 mL injection 60 mg  60 mg Intravenous Q24H Drema Dallas, MD   60 mg at 09/14/20 0745  . metoprolol tartrate (LOPRESSOR) injection 5 mg  5 mg Intravenous Q4H PRN Maretta Bees, MD   5 mg  at 09/14/20 0724  . metoprolol tartrate (LOPRESSOR) tablet 25 mg  25 mg Oral Q8H Ghimire, Shanker M, MD   25 mg at 09/14/20 0729  . nicotine (NICODERM CQ - dosed in mg/24 hours) patch 14 mg  14 mg Transdermal Daily Drema Dallas, MD   14 mg at 09/14/20 1027  . pantoprazole (PROTONIX) EC tablet 40 mg  40 mg Oral BID Maretta Bees, MD   40 mg at 09/14/20 0908  . remdesivir 100 mg in sodium chloride 0.9 % 100 mL IVPB  100 mg Intravenous Daily Drema Dallas, MD 200 mL/hr at 09/14/20 0915 100 mg at 09/14/20 0915  . senna (SENOKOT) tablet 8.6 mg  1 tablet Oral BID Drema Dallas, MD   8.6 mg at 09/14/20 2536  . sertraline (ZOLOFT) tablet 50 mg  50 mg Oral Daily Norins, Rosalyn Gess, MD   50 mg at 09/14/20 0908  . simvastatin (ZOCOR) tablet 40 mg  40 mg Oral QHS Norins, Rosalyn Gess, MD  40 mg at 09/13/20 2204  . traZODone (DESYREL) tablet 25 mg  25 mg Oral QHS PRN Drema Dallas, MD   25 mg at 09/13/20 0038  . vitamin B-12 (CYANOCOBALAMIN) tablet 500 mcg  500 mcg Oral Daily Norins, Rosalyn Gess, MD   500 mcg at 09/14/20 0909  . zinc sulfate capsule 220 mg  220 mg Oral Daily Drema Dallas, MD   220 mg at 09/14/20 0908     Discharge Medications: Please see discharge summary for a list of discharge medications.  Relevant Imaging Results:  Relevant Lab Results:   Additional Information SSN: [id number] [place], [place]

## 2020-09-14 NOTE — Progress Notes (Addendum)
Modified Barium Swallow Progress Note  Patient Details  Name: Troy [name] MRN: [id number] Date of Birth: [birth year]  Today's Date: 09/14/2020  Modified Barium Swallow completed.  Full report located under Chart Review in the Imaging Section.  Brief recommendations include the following:  Clinical Impression  Pt presents with oropharyngeal dysphagia characterized by reduced bolus propulsion, reduced lingual retraction, and a a pharyngeal delay. He demonstrated base of tongue residue, vallecular residue, and pyriform sinus residue which were reduced with secondary swallows. Greater than 50% of thin liquid boluses were frequently propelled to the pyriform sinuses prior to any attempt at airway protection. Penetration and aspiration (PAS 5, 7) were demonstrated with thin liquids and resulted in weak, ineffective coughing. Penetration (PAS 3) was noted with nectar thick liquids via straw, but not via cup. No functional benefit was noted with a chin tuck posture. Additional trials and attempts at compensatory strategy were refused by the pt who stated the he was tired and would not participate further. A dysphagia 2 diet with nectar thick liquids via cup is recommended at this time. SLP will follow for dysphagia treatment to assess diet tolerance and to determine whether further modifications are needed due to pt's respiratory status.    Swallow Evaluation Recommendations       SLP Diet Recommendations: Dysphagia 2 (Fine chop) solids;Nectar thick liquid   Liquid Administration via: Cup;No straw   Medication Administration: Whole meds with puree   Supervision: Staff to assist with self feeding;Intermittent supervision to cue for compensatory strategies   Compensations: Slow rate;Small sips/bites   Postural Changes: Seated upright at 90 degrees;Remain semi-upright after after feeds/meals (Comment)   Oral Care Recommendations: Oral care BID   Other Recommendations: Order thickener from  pharmacy   Herscher I. Vear Clock, MS, CCC-SLP Acute Rehabilitation Services Office number [phone] Pager [phone]   [name] 09/14/2020,4:11 PM

## 2020-09-14 NOTE — Telephone Encounter (Signed)
Noted  Will follow along

## 2020-09-14 NOTE — Evaluation (Signed)
Occupational Therapy Evaluation Patient Details Name: Troy [name] MRN: [id number] DOB: 11-18-1932 Today's Date: 09/14/2020    History of Present Illness Patient is a 86 y.o. male with PMHx of COPD, CAD s/p CABG, HTN, HLD, DM-2, HL, tobacco use-presented with fall/shortness of breath-found to have acute hypoxic respiratory failure due to COVID-19 pneumonia.  Hospital course complicated by development of A. fib with RVR.   Clinical Impression   PTA pt living with family and required as needed assist for ADL/IADL due to baseline dementia. At time of eval, pt requires mod A to complete bed mobility and mod A +2 to complete safe transfers 2/2 posterior lean. Pt first transferred to the recliner and recliner was then wheeled to sink in anticipation for grooming tasks. Pt was able to wash face standing at sink for 75% of task before fatiguing. Noted cognitive deficits in safety awareness, problem solving, and memory throughout session-pt requires increased time and cues to process. Noted gurgling/crackling sounds in lungs- pt not able to from productive cough despite use of IS and mobility. Pt requires 4L [place] to maintain SpO2 with mobility. RN aware. At this time recommend SNF to improve ADL mobility progression prior to d/c home. Will continue to follow per POC listed below.    Follow Up Recommendations  SNF    Equipment Recommendations  3 in 1 bedside commode;Wheelchair (measurements OT);Wheelchair cushion (measurements OT);Hospital bed    Recommendations for Other Services       Precautions / Restrictions Precautions Precautions: Fall Restrictions Weight Bearing Restrictions: No      Mobility Bed Mobility Overal bed mobility: Needs Assistance Bed Mobility: Supine to Sit     Supine to sit: Mod assist     General bed mobility comments: Mod A to lift trunk for sitting    Transfers Overall transfer level: Needs assistance Equipment used: Rolling walker (2 wheeled) Transfers:  Stand Pivot Transfers;Sit to/from Stand Sit to Stand: Mod assist;+2 physical assistance Stand pivot transfers: Mod assist;+2 physical assistance       General transfer comment: from EOB and from recliner. POsterior lean noted with transfers requiring assist to maintain upright balance    Balance Overall balance assessment: Needs assistance Sitting-balance support: No upper extremity supported;Bilateral upper extremity supported Sitting balance-Leahy Scale: Fair Sitting balance - Comments: Static sitting without UE support but with the least challenge pt leaning posteriorly Postural control: Posterior lean Standing balance support: Bilateral upper extremity supported;During functional activity Standing balance-Leahy Scale: Poor Standing balance comment: requiring RW and mod A due to posterior lean - pt unaware of lean and unable to correct                           ADL either performed or assessed with clinical judgement   ADL Overall ADL's : Needs assistance/impaired Eating/Feeding: Set up;Sitting   Grooming: Set up;Sitting;Standing Grooming Details (indicate cue type and reason): pt able to stand to wash his face for 75% of task- then leaning over on forearams and sitting down 2/2 fatigue Upper Body Bathing: Minimal assistance;Sitting   Lower Body Bathing: Minimal assistance;Sit to/from stand;Sitting/lateral leans   Upper Body Dressing : Set up;Sitting   Lower Body Dressing: Minimal assistance;Sit to/from stand;Sitting/lateral leans   Toilet Transfer: Moderate assistance;+2 for physical assistance;+2 for safety/equipment;Ambulation;RW Toilet Transfer Details (indicate cue type and reason): posterior lean with mobility- requires assist to maintain balance Toileting- Clothing Manipulation and Hygiene: Total assistance       Functional mobility during  ADLs: Moderate assistance;+2 for physical assistance;+2 for safety/equipment;Rolling walker General ADL Comments:  posterior lean impacting balance in mobility     Vision Patient Visual Report: No change from baseline       Perception     Praxis      Pertinent Vitals/Pain Pain Assessment: No/denies pain     Hand Dominance     Extremity/Trunk Assessment Upper Extremity Assessment Upper Extremity Assessment: Generalized weakness   Lower Extremity Assessment Lower Extremity Assessment: Generalized weakness       Communication Communication Communication: HOH   Cognition Arousal/Alertness: Awake/alert Behavior During Therapy: WFL for tasks assessed/performed Overall Cognitive Status: No family/caregiver present to determine baseline cognitive functioning Area of Impairment: Orientation;Awareness;Attention;Problem solving;Memory;Following commands;Safety/judgement                 Orientation Level: Disoriented to;Situation;Time Current Attention Level: Sustained Memory: Decreased short-term memory;Decreased recall of precautions Following Commands: Follows one step commands consistently Safety/Judgement: Decreased awareness of safety;Decreased awareness of deficits Awareness: Emergent Problem Solving: Requires verbal cues;Requires tactile cues General Comments: hx of dementia. major deficits noted in safety/awareness.   General Comments  on 4L with sats down to 84% HR 110s.    Exercises Exercises: Other exercises Other Exercises Other Exercises: Educated in use of IS and pt able to pull 1250 ml x 4 reps.   Shoulder Instructions      Home Living Family/patient expects to be discharged to:: Private residence Living Arrangements: Children Available Help at Discharge: Family;Available 24 hours/day (reports son and dtr) Type of Home: House Home Access: Stairs to enter [employer] of Steps: 1 Entrance Stairs-Rails: None Home Layout: One level     Bathroom Shower/Tub: Tub/shower unit;Walk-in shower   Bathroom Toilet: Standard     Home Equipment: Environmental consultant - 2  wheels;Cane - single point;Bedside commode;Wheelchair - manual          Prior Functioning/Environment Level of Independence: Independent        Comments: Reports independents with ADLs, IADLs, community ambulation and driving.  Pt is questionable historian        OT Problem List: Decreased strength;Decreased knowledge of use of DME or AE;Decreased knowledge of precautions;Decreased activity tolerance;Decreased cognition;Impaired balance (sitting and/or standing);Decreased safety awareness;Cardiopulmonary status limiting activity      OT Treatment/Interventions: Self-care/ADL training;Therapeutic exercise;Patient/family education;Energy conservation;Therapeutic activities;Cognitive remediation/compensation;DME and/or AE instruction    OT Goals(Current goals can be found in the care plan section) Acute Rehab OT Goals Patient Stated Goal: Go home ASAP OT Goal Formulation: With patient Time For Goal Achievement: 09/28/20 Potential to Achieve Goals: Good  OT Frequency: Min 2X/week   Barriers to D/C:            Co-evaluation              AM-PAC OT "6 Clicks" Daily Activity     Outcome Measure Help from another person eating meals?: A Little Help from another person taking care of personal grooming?: A Little Help from another person toileting, which includes using toliet, bedpan, or urinal?: A Lot Help from another person bathing (including washing, rinsing, drying)?: A Lot Help from another person to put on and taking off regular upper body clothing?: A Little Help from another person to put on and taking off regular lower body clothing?: A Lot 6 Click Score: 15   End of Session Equipment Utilized During Treatment: Gait belt;Oxygen;Rolling walker Nurse Communication: Mobility status  Activity Tolerance: Patient tolerated treatment well Patient left: in chair;with call bell/phone within reach;with chair alarm  set  OT Visit Diagnosis: Unsteadiness on feet  (R26.81);Other abnormalities of gait and mobility (R26.89);Muscle weakness (generalized) (M62.81);Other symptoms and signs involving cognitive function                Time: [id number] OT Time Calculation (min): 33 min Charges:  OT General Charges $OT Visit: 1 Visit OT Evaluation $OT Eval Moderate Complexity: Angie, MSOT, OTR/L [place] [facility] Office Number: [phone] Pager: [phone]  Zenovia Jarred 09/14/2020, 1:39 PM

## 2020-09-14 NOTE — Progress Notes (Signed)
Pharmacy Antibiotic Note  Troy [name] is a 86 y.o. male admitted on 09/10/2020 with pneumonia.  Pharmacy has been consulted for Unasyn dosing.  Pt has been here for COVID PNA. He has completed his remdesivir. He is still on O2 and aspirating per Dr Sloan Leiter. Start empiric Unasyn.  Scr 1.85 CrCl 28 ml/min  Plan: Unasyn 3g IV q12 F/u with LOT  Height: $Remove'5\' 8"'[id number]$  (172.7 cm) Weight: 69.9 kg (154 lb 1.6 oz) IBW/kg (Calculated) : 68.4  Temp (24hrs), Avg:98.2 F (36.8 C), Min:98 F (36.7 C), Max:98.6 F (37 C)  Recent Labs  Lab 09/10/20 1340 09/11/20 0509 09/11/20 1035 09/12/20 0110 09/13/20 0103 09/14/20 0049  WBC 9.1 10.2  --  8.5 7.6 7.8  CREATININE 2.63* 2.41* 2.17* 1.99* 1.57* 1.85*    Estimated Creatinine Clearance: 28.2 mL/min (A) (by C-G formula based on SCr of 1.85 mg/dL (H)).    Allergies  Allergen Reactions  . Codeine     REACTION: vomiting  . Cyclobenzaprine Hcl     REACTION: vomiting  . Hydrocodone Nausea Only and Other (See Comments)    "crazy"  . Tramadol Other (See Comments)    woozy  . Valium Other (See Comments)    sedation    Antimicrobials this admission: 12/26 remdesivir >> 12/30 12/28 baricitinib>>1/9 eGFR 35 12/30 Unasyn>>  Dose adjustments this admission:  Microbiology results:   Onnie Boer, PharmD, BCIDP, AAHIVP, CPP Infectious Disease Pharmacist 09/14/2020 10:56 AM

## 2020-09-14 NOTE — Plan of Care (Signed)

## 2020-09-14 NOTE — Progress Notes (Signed)
Physical Therapy Treatment Patient Details Name: Troy [name] MRN: [id number] DOB: 1933-08-11 Today's Date: 09/14/2020    History of Present Illness Patient is a 86 y.o. male with PMHx of COPD, CAD s/p CABG, HTN, HLD, DM-2, HL, tobacco use-presented with fall/shortness of breath-found to have acute hypoxic respiratory failure due to COVID-19 pneumonia.  Hospital course complicated by development of A. fib with RVR.    PT Comments    Patient continues with strong posterior lean when up on his feet. Requires +2 mod assist to maintain upright, demonstrating poor awareness of his posterior lean. After transfer bed to recliner, rolled pt and recliner to sink and pt did standing ADL with OT. On 4L throughout session.   Follow Up Recommendations  SNF     Equipment Recommendations  Rolling walker with 5" wheels;3in1 (PT);Wheelchair cushion (measurements PT);Wheelchair (measurements PT)    Recommendations for Other Services       Precautions / Restrictions Precautions Precautions: Fall    Mobility  Bed Mobility Overal bed mobility: Needs Assistance Bed Mobility: Supine to Sit     Supine to sit: Mod assist     General bed mobility comments: Mod A to lift trunk for sitting  Transfers Overall transfer level: Needs assistance Equipment used: Rolling walker (2 wheeled) Transfers: Stand Pivot Transfers;Sit to/from Stand Sit to Stand: Mod assist;+2 physical assistance Stand pivot transfers: Mod assist;+2 physical assistance (with RW)       General transfer comment: from EOB and from recliner  Ambulation/Gait Ambulation/Gait assistance: Mod assist;+2 physical assistance;+2 safety/equipment Gait Distance (Feet): 3 Feet Assistive device: Rolling walker (2 wheeled) Gait Pattern/deviations: Step-to pattern;Decreased stride length;Trunk flexed;Narrow base of support Gait velocity: decreased   General Gait Details: Pt very unsteady, posterior lean, poor safety.   Stairs              Wheelchair Mobility    Modified Rankin (Stroke Patients Only)       Balance Overall balance assessment: Needs assistance Sitting-balance support: No upper extremity supported;Bilateral upper extremity supported Sitting balance-Leahy Scale: Fair Sitting balance - Comments: Static sitting without UE support but with the least challenge pt leaning posteriorly Postural control: Posterior lean   Standing balance-Leahy Scale: Poor Standing balance comment: requiring RW and mod A due to posterior lean - pt unaware of lean and unable to correct                            Cognition Arousal/Alertness: Awake/alert Behavior During Therapy: WFL for tasks assessed/performed Overall Cognitive Status: No family/caregiver present to determine baseline cognitive functioning Area of Impairment: Orientation;Awareness;Attention;Problem solving;Memory;Following commands;Safety/judgement                 Orientation Level: Disoriented to;Situation;Time Current Attention Level: Sustained Memory: Decreased short-term memory;Decreased recall of precautions Following Commands: Follows one step commands consistently Safety/Judgement: Decreased awareness of safety;Decreased awareness of deficits Awareness: Emergent Problem Solving: Requires verbal cues;Requires tactile cues General Comments: Major limitations in safety awareness/awareness of deficits - pt unaware of his poor balance and O2 needs      Exercises Other Exercises Other Exercises: Educated in use of IS and pt able to pull 1250 ml x 4 reps.    General Comments General comments (skin integrity, edema, etc.): on 4L with sats down to 84% HR 110s.      Pertinent Vitals/Pain Pain Assessment: No/denies pain    Home Living  Prior Function            PT Goals (current goals can now be found in the care plan section) Acute Rehab PT Goals Patient Stated Goal: Go home ASAP Time For  Goal Achievement: 09/27/20 Potential to Achieve Goals: Good Progress towards PT goals: Progressing toward goals    Frequency    Min 3X/week      PT Plan Current plan remains appropriate    Co-evaluation              AM-PAC PT "6 Clicks" Mobility   Outcome Measure  Help needed turning from your back to your side while in a flat bed without using bedrails?: A Little Help needed moving from lying on your back to sitting on the side of a flat bed without using bedrails?: A Little Help needed moving to and from a bed to a chair (including a wheelchair)?: A Lot Help needed standing up from a chair using your arms (e.g., wheelchair or bedside chair)?: A Lot Help needed to walk in hospital room?: A Lot Help needed climbing 3-5 steps with a railing? : A Lot 6 Click Score: 14    End of Session Equipment Utilized During Treatment: Gait belt;Oxygen Activity Tolerance: Patient tolerated treatment well Patient left: with call bell/phone within reach;with nursing/sitter in room (on [facility] with nurse tech present) Nurse Communication: Mobility status PT Visit Diagnosis: Unsteadiness on feet (R26.81);Muscle weakness (generalized) (M62.81)     Time: [id number] PT Time Calculation (min) (ACUTE ONLY): 33 min  Charges:  $Therapeutic Activity: 8-22 mins                      Arby Barrette, PT Pager [phone]    Rexanne Mano 09/14/2020, 11:56 AM

## 2020-09-14 NOTE — Evaluation (Signed)
Clinical/Bedside Swallow Evaluation Patient Details  Name: Troy [name] MRN: [id number] Date of Birth: 09-24-1932  Today's Date: 09/14/2020 Time: SLP Start Time (ACUTE ONLY): 1140 SLP Stop Time (ACUTE ONLY): 1152 SLP Time Calculation (min) (ACUTE ONLY): 12 min  Past Medical History:  Past Medical History:  Diagnosis Date  . Aorto-iliac atherosclerosis (HCC) 08/2015   by xray  . Barrett's esophagus    EGD 2012, rpt 2014, longterm PPI  . Chronic idiopathic constipation 07/17/2010  . Chronic obstructive pulmonary disease (COPD) (HCC)    spirometry with moderate COPD  . Coronary atherosclerosis of artery bypass graft   . Diabetes mellitus type II ~2008  . Gastrointestinal ulcer 04/2011   with hematemesis s/p EGD by [facility]  . GERD (gastroesophageal reflux disease)   . Hearing loss   . HLD (hyperlipidemia)   . HNP (herniated nucleus pulposus), lumbar 2013   with compression of L5 nerve root and foot drop, also with lumbar DDD s/p surgery  . Hypertrophy of prostate without urinary obstruction and other lower urinary tract symptoms (LUTS)   . Memory loss   . Paroxysmal SVT (supraventricular tachycardia) (HCC)    a. 3/14 => converted in ED with adenosine to AFib => NSR;  b. Echo 4/14:  Mild LVH, mild FBSH, EF 55-60%, Gr 1 DD  . Prostate nodule ~2009   Left-benign s/p eval Uro WNL ([place])  . Thrombocytopenia (HCC)   . Tobacco use disorder    Past Surgical History:  Past Surgical History:  Procedure Laterality Date  . COLONOSCOPY  03/2009   small hemorrhoids, 2 polyps (hyperplastic and adenomatous), rpt 5 yrs (Dr. Maryjane Hurter) - pt declined rpt scheduling  . COLONOSCOPY  10/2014   2 hyperplastic polyp, hemmorhoids ([place])  . CORONARY ARTERY BYPASS GRAFT  1999   Dr. Cornelius Moras  . ESOPHAGOGASTRODUODENOSCOPY  05/16/2011   after hematemesis, no lesions found, consistent with barrett's esophagus rpt 2 yrs  . ESOPHAGOGASTRODUODENOSCOPY  06/2013   barrett's, small HH, recheck 2-3 yrs ([place])   . HEMORRHOID SURGERY    . LUMBAR LAMINECTOMY/DECOMPRESSION MICRODISCECTOMY  01/31/2012   Procedure: LUMBAR LAMINECTOMY/DECOMPRESSION MICRODISCECTOMY 1 LEVEL;  Surgeon: Carmela Hurt, MD;  Location: [facility];  Service: Neurosurgery;  Laterality: Left;  LEFT Lumbar Four-Five Diskectomy  . NOSE SURGERY    . ROTATOR CUFF REPAIR  04/2010   right (but has bilat) Dr. Yisroel Ramming ([facility])   HPI:  Pt is an 86 y.o. male with medical history significant of COPD, CAD status post CABG, HTN, HLD, dementia, DM who presented the emergency department with reported fall and shortness of breath. Pt found to have acute hypoxic respiratory failure due to COVID-19 pneumonia. CXR 12/29: Ill-defined airspace opacity in the lower lung region superimposed on underlying fibrosis. Per referring MD's note, pt appeared "to be accumulating some secretions and wheezing/gurgling at times." and SLP was consulted "to make sure no overt aspiration".   Assessment / Plan / Recommendation Clinical Impression  Pt was seen for bedside swallow evaluation. He did not provide any information regarding his baseline swallow function despite multiple requests. Oral mechanism exam was [facility] and dentition adequate. RR was <30 for most of the evaluation but occasionally noted to be in the low to mid 30s. He refused regular texture solids and exhibited signs of aspiration with ice chips, thin liquids, and puree solids. A modified barium swallow study is recommended to further assess swallow function and is currently scheduled for today at 1400. Diet recommendations will be deferred until it  is completed. SLP Visit Diagnosis: Dysphagia, unspecified (R13.10)    Aspiration Risk  Moderate aspiration risk    Diet Recommendation  (deferred until MBS completed)   Medication Administration: Crushed with puree Compensations: Slow rate;Small sips/bites Postural Changes: Seated upright at 90 degrees    Other  Recommendations Oral Care  Recommendations: Oral care BID   Follow up Recommendations  (TBD)      Frequency and Duration min 2x/week  2 weeks       Prognosis Prognosis for Safe Diet Advancement:  (TBD) Barriers to Reach Goals: Severity of deficits      Swallow Study   General Date of Onset: 09/13/20 HPI: Pt is an 86 y.o. male with medical history significant of COPD, CAD status post CABG, HTN, HLD, dementia, DM who presented the emergency department with reported fall and shortness of breath. Pt found to have acute hypoxic respiratory failure due to COVID-19 pneumonia. CXR 12/29: Ill-defined airspace opacity in the lower lung region superimposed on underlying fibrosis. Per referring MD's note, pt appeared "to be accumulating some secretions and wheezing/gurgling at times." and SLP was consulted "to make sure no overt aspiration". Type of Study: Bedside Swallow Evaluation Previous Swallow Assessment: None Diet Prior to this Study: Regular;Thin liquids Temperature Spikes Noted: No Respiratory Status: Nasal cannula History of Recent Intubation: No Behavior/Cognition: Alert;Cooperative;Pleasant mood Oral Cavity Assessment: Within Functional Limits Oral Care Completed by SLP: No Oral Cavity - Dentition: Adequate natural dentition Vision: Functional for self-feeding Self-Feeding Abilities: Needs assist Patient Positioning: Upright in bed;Postural control adequate for testing Baseline Vocal Quality: Normal Volitional Cough: Weak Volitional Swallow: Able to elicit    Oral/Motor/Sensory Function Overall Oral Motor/Sensory Function: Within functional limits   Ice Chips Ice chips: Impaired Presentation: Spoon Pharyngeal Phase Impairments: Throat Clearing - Immediate;Cough - Immediate   Thin Liquid Thin Liquid: Impaired Presentation: Cup;Straw Pharyngeal  Phase Impairments: Cough - Delayed    Nectar Thick Nectar Thick Liquid: Not tested   Honey Thick Honey Thick Liquid: Not tested   Puree Puree:  Impaired Presentation: Spoon Pharyngeal Phase Impairments: Throat Clearing - Immediate   Solid     Solid: Not tested (pt refused)     Aser Nylund I. Hardin Negus, [place], [place] Office number [phone] Pager [place] 09/14/2020,12:45 PM

## 2020-09-14 NOTE — Progress Notes (Signed)
PROGRESS NOTE                                                                                                                                                                                                             Patient Demographics:    Troy [name], is a 86 y.o. male, DOB - July 16, 1933, [id number]  Outpatient Primary MD for the patient is [name], MD   Admit date - 09/10/2020   LOS - 4  Chief Complaint  Patient presents with  . Shortness of Breath       Brief Narrative: Patient is a 86 y.o. male with PMHx of COPD, CAD s/p CABG, HTN, HLD, DM-2, HL, tobacco use-presented with fall/shortness of breath-found to have acute hypoxic respiratory failure due to COVID-19 pneumonia.  Hospital course complicated by development of A. fib with RVR.  COVID-19 vaccinated status: Unvaccinated  Significant Events: 12/26>> Admit to [facility] for hypoxia due to COVID-19 pneumonia 12/28>> A. fib with RVR  Significant studies: 12/26>> CT head: No acute abnormality 12/26>> CT C-spine: No acute abnormality 12/26>> CT chest: Emphysematous changes-biapical pleural thickening.  1 cm right lower lobe nodule, 7-8 mm left lower lobe nodule.  Mild dilatation of the ascending aortic aorta measuring 3.7 cm 12/27>> Echo: EF 55-60% 12/29>> chest x-ray: Ill-defined airspace opacity in the lower lung region superimposed on underlying fibrosis. 12/29>> lower extremity Doppler: No DVT  COVID-19 medications: Steroids: 12/26>> Remdesivir: 12/26>>12/30 Baricitinib: 12/28>>  Antibiotics: Unasyn: 12/30 x 5 days  Microbiology data: None  Procedures: None  Consults: None  DVT prophylaxis: apixaban (ELIQUIS) tablet 2.5 mg Start: 09/13/20 1100 apixaban (ELIQUIS) tablet 2.5 mg     Subjective:   Still wheezing-a flutter with heart rates in the 160s-170s range this morning.  Less gurgling of his secretions today compared to  yesterday.   Assessment  & Plan :   Acute Hypoxic Resp Failure due to Covid 19 Viral pneumonia: Continues to have severe hypoxemia-requiring anywhere from 5-7 L of oxygen.  Suspect he has some aspiration provoking bronchospasms-we will initiate Unasyn today.  He remains on steroids and baricitinib-we will complete Remdesivir on 12/30.  Appreciate SLP evaluation-await MBS.   Fever: afebrile O2 requirements:  SpO2: 94 % O2 Flow Rate (L/min): 5 L/min   COVID-19 Labs: Recent Labs    09/12/20 0110 09/13/20 0103 09/14/20 0049  DDIMER  3.27* 2.43* 1.87*  FERRITIN 203 159 268  LDH 307* 302* 322*  CRP 12.4* 18.9* 14.4*       Component Value Date/Time   BNP 447.7 (H) 09/14/2020 0049    Recent Labs  Lab 09/11/20 0508 09/14/20 0049  PROCALCITON 4.51 1.73    Lab Results  Component Value Date   SARSCOV2NAA POSITIVE (A) 09/10/2020    Prone/Incentive Spirometry: encouraged incentive spirometry use 3-4/hour.  Atrial fibrillation with RVR: Rate uncontrolled this morning-required IV Lopressor-increase metoprolol to 25 mg p.o. every 8-follow closely-continue telemetry monitoring.  Remains on Eliquis.   CHA2DS2-VASc of around 4.  COPD exacerbation: Some mild wheezing-suspect this is mostly from bronchospasm due to aspiration-due to RVR-we will switch to Xopenex inhaler-remains on steroids-starting Unasyn today.  Chronic diastolic heart failure: No signs of volume overload-monitor closely-May require diuretics over the next few days.  AKI on CKD stage IIIa: AKI hemodynamically mediated--creatinine had improved-but seems to have slightly worsened again today.  Continue to avoid nephrotoxic agents-volume status reasonable-follow closely for now.  CAD-s/p CABG: No anginal symptoms-continue statin/beta-blocker-no longer on ASA as patient on anticoagulation  HTN: BP controlled--stopping Imdur-continue metoprolol  HLD: Continue statin  DM-2: CBGs controlled-but a.m. CBGs on the lower  side-decrease Lantus to 6 units-continue SSI and follow.    Recent Labs    09/13/20 2122 09/14/20 0732 09/14/20 1225  GLUCAP 120* 87 135*    Chronic thrombocytopenia: Some mild worsening due to COVID-19-follow closely.  GERD: Continue PPI  Tobacco abuse: Continue transdermal nicotine  Dementia with mild delirium: Appears to be mild-continue supportive care-expect some amount of delirium during this hospital stay.  Remains on Aricept, Namenda, trazodone and Zoloft  Lung nodule: Seen incidentally on CT chest-stable for outpatient follow-up by PCP   Palliative care: DNR in place-worsening hypoxemia in the past few days-concern for aspiration-has significant pre-existing COPD/fibrosis of his lungs-hopefully he will slowly improve-however he is not a good candidate for aggressive medical care.  Spoke with patient's son [name] over the phone on 12/29-he is aware that if patient worsens significantly-we may need to pursue hospice care.  Per patient's son Don-patient has expressed his views very clearly-that if it was his "time"-he did not want heroics.   ABG:    Component Value Date/Time   HCO3 15.8 (L) 09/10/2020 1354   TCO2 17 (L) 09/10/2020 1354   ACIDBASEDEF 12.0 (H) 09/10/2020 1354   O2SAT 51.0 09/10/2020 1354    Vent Settings: N/A    Condition - Extremely Guarded  Family Communication  :  Son [name] [phone]) updated over the phone on 12/30  Code Status :  DNR  Diet :  Diet Order            Diet heart healthy/carb modified Room service appropriate? Yes; Fluid consistency: Thin  Diet effective now                  Disposition Plan  :   Status is: Inpatient  Remains inpatient appropriate because:Inpatient level of care appropriate due to severity of illness   Dispo: The patient is from: Home              Anticipated d/c is to: Home              Anticipated d/c date is: > 3 days              Patient currently is not medically stable to d/c.     Barriers to  discharge: Hypoxia requiring O2 supplementation/complete  5 days of IV Remdesivir  Antimicorbials  :    Anti-infectives (From admission, onward)   Start     Dose/Rate Route Frequency Ordered Stop   09/14/20 1200  Ampicillin-Sulbactam (UNASYN) 3 g in sodium chloride 0.9 % 100 mL IVPB        3 g 200 mL/hr over 30 Minutes Intravenous Every 12 hours 09/14/20 1053     09/11/20 1000  remdesivir 100 mg in sodium chloride 0.9 % 100 mL IVPB       "Followed by" Linked Group Details   100 mg 200 mL/hr over 30 Minutes Intravenous Daily 09/10/20 1533 09/14/20 0947   09/10/20 1600  remdesivir 200 mg in sodium chloride 0.9% 250 mL IVPB       "Followed by" Linked Group Details   200 mg 580 mL/hr over 30 Minutes Intravenous Once 09/10/20 1533 09/10/20 1819      Inpatient Medications  Scheduled Meds: . albuterol  4 puff Inhalation Q6H  . apixaban  2.5 mg Oral BID  . vitamin C  500 mg Oral Daily  . baricitinib  2 mg Oral Daily  . donepezil  10 mg Oral QHS  . fluticasone furoate-vilanterol  1 puff Inhalation Daily  . insulin aspart  0-20 Units Subcutaneous TID WC  . insulin glargine  10 Units Subcutaneous QHS  . memantine  10 mg Oral BID  . methylPREDNISolone (SOLU-MEDROL) injection  60 mg Intravenous Q24H  . metoprolol tartrate  25 mg Oral Q8H  . nicotine  14 mg Transdermal Daily  . pantoprazole  40 mg Oral BID  . senna  1 tablet Oral BID  . sertraline  50 mg Oral Daily  . simvastatin  40 mg Oral QHS  . vitamin B-12  500 mcg Oral Daily  . zinc sulfate  220 mg Oral Daily   Continuous Infusions: . ampicillin-sulbactam (UNASYN) IV Stopped (09/14/20 1230)  . dextrose 5 % and 0.9% NaCl 10 mL/hr at 09/13/20 0725   PRN Meds:.acetaminophen, albuterol, guaiFENesin-dextromethorphan, haloperidol lactate, metoprolol tartrate, traZODone   Time Spent in minutes  35     See all Orders from today for further details   Oren Binet M.D on 09/14/2020 at 2:20 PM  To page go to [email or web address] -  use universal password  [facility] -  Office  [phone]    Objective:   Vitals:   09/14/20 0748 09/14/20 0800 09/14/20 0854 09/14/20 1222  BP:  123/66 114/90 100/60  Pulse:  (!) 107 96 88  Resp:  (!) 30 (!) 21 20  Temp:   98.6 F (37 C) 98.8 F (37.1 C)  TempSrc:   Axillary Axillary  SpO2: 90% 93% 93% 94%  Weight:      Height:        Wt Readings from Last 3 Encounters:  09/12/20 69.9 kg  04/10/20 71.7 kg  01/24/20 72.4 kg     Intake/Output Summary (Last 24 hours) at 09/14/2020 1420 Last data filed at 09/14/2020 1230 Gross per 24 hour  Intake 511.49 ml  Output 550 ml  Net -38.51 ml     Physical Exam Gen Exam:Alert awake-not in any distress HEENT:atraumatic, normocephalic Chest: B/L clear to auscultation anteriorly +wheezing CVS:S1S2 regular Abdomen:soft non tender, non distended Extremities:no edema Neurology: Non focal Skin: no rash   Data Review:    CBC Recent Labs  Lab 09/10/20 1340 09/10/20 1354 09/11/20 0509 09/12/20 0110 09/13/20 0103 09/14/20 0049  WBC 9.1  --  10.2 8.5 7.6 7.8  HGB 14.0 14.3  11.9* 12.7* 12.1* 12.0*  HCT 42.7 42.0 34.7* 39.1 36.5* 36.7*  PLT 102*  --  82* 90* 85* 107*  MCV 96.0  --  92.5 93.3 92.2 92.4  MCH 31.5  --  31.7 30.3 30.6 30.2  MCHC 32.8  --  34.3 32.5 33.2 32.7  RDW 14.4  --  14.5 14.4 14.4 14.6  LYMPHSABS 0.5*  --  0.9 0.6* 0.7 0.8  MONOABS 0.5  --  0.3 0.3 0.3 0.3  EOSABS 0.0  --  0.0 0.0 0.0 0.0  BASOSABS 0.0  --  0.0 0.0 0.0 0.0    Chemistries  Recent Labs  Lab 09/10/20 1340 09/10/20 1354 09/11/20 0509 09/11/20 0913 09/11/20 1035 09/12/20 0110 09/13/20 0103 09/14/20 0049  NA 141   < > 135  --  141 142 140 141  K 5.3*   < > 5.7*  --  4.7 4.8 4.9 4.8  CL 109  --  109  --  114* 112* 114* 112*  CO2 13*  --  15*  --  16* 20* 16* 17*  GLUCOSE 183*  --  130*  --  124* 117* 113* 113*  BUN 58*  --  66*  --  62* 61* 51* 60*  CREATININE 2.63*  --  2.41*  --  2.17* 1.99* 1.57* 1.85*   CALCIUM 9.2  --  8.2*  --  8.3* 8.5* 8.4* 8.6*  MG  --   --   --  2.1  --  2.1 2.1 2.2  AST 65*  --  101*  --   --  106* 83* 61*  ALT 41  --  35  --   --  43 42 40  ALKPHOS 120  --  86  --   --  85 69 67  BILITOT 0.8  --  1.2  --   --  0.7 0.5 0.6   < > = values in this interval not displayed.   ------------------------------------------------------------------------------------------------------------------ No results for input(s): CHOL, HDL, LDLCALC, TRIG, CHOLHDL, LDLDIRECT in the last 72 hours.  Lab Results  Component Value Date   HGBA1C 6.1 (H) 09/11/2020   ------------------------------------------------------------------------------------------------------------------ No results for input(s): TSH, T4TOTAL, T3FREE, THYROIDAB in the last 72 hours.  Invalid input(s): FREET3 ------------------------------------------------------------------------------------------------------------------ Recent Labs    09/13/20 0103 09/14/20 0049  FERRITIN 159 268    Coagulation profile No results for input(s): INR, PROTIME in the last 168 hours.  Recent Labs    09/13/20 0103 09/14/20 0049  DDIMER 2.43* 1.87*    Cardiac Enzymes No results for input(s): CKMB, TROPONINI, MYOGLOBIN in the last 168 hours.  Invalid input(s): CK ------------------------------------------------------------------------------------------------------------------    Component Value Date/Time   BNP 447.7 (H) 09/14/2020 0049    Micro Results Recent Results (from the past 240 hour(s))  Resp Panel by RT-PCR (Flu A&B, Covid) Nasopharyngeal Swab     Status: Abnormal   Collection Time: 09/10/20  1:27 PM   Specimen: Nasopharyngeal Swab; Nasopharyngeal(NP) swabs in vial transport medium  Result Value Ref Range Status   SARS Coronavirus 2 by RT PCR POSITIVE (A) NEGATIVE Final    Comment: RESULT CALLED TO, READ BACK BY AND VERIFIED WITH: JOE B. [id number] (NOTE) SARS-CoV-2 target nucleic acids are  DETECTED.  The SARS-CoV-2 RNA is generally detectable in upper respiratory specimens during the acute phase of infection. Positive results are indicative of the presence of the identified virus, but do not rule out bacterial infection or co-infection with other pathogens not detected by the test.  Clinical correlation with patient history and other diagnostic information is necessary to determine patient infection status. The expected result is Negative.  Fact Sheet for Patients: EntrepreneurPulse.com.au  Fact Sheet for Healthcare Providers: IncredibleEmployment.be  This test is not yet approved or cleared by the [place] FDA and  has been authorized for detection and/or diagnosis of SARS-CoV-2 by FDA under an Emergency Use Authorization (EUA).  This EUA will remain in effect (meaning this test can be used) for the  duration of  the COVID-19 declaration under Section 564(b)(1) of the Act, 21 U.S.C. section 360bbb-3(b)(1), unless the authorization is terminated or revoked sooner.     Influenza A by PCR NEGATIVE NEGATIVE Final   Influenza B by PCR NEGATIVE NEGATIVE Final    Comment: (NOTE) The Xpert Xpress SARS-CoV-2/FLU/RSV plus assay is intended as an aid in the diagnosis of influenza from Nasopharyngeal swab specimens and should not be used as a sole basis for treatment. Nasal washings and aspirates are unacceptable for Xpert Xpress SARS-CoV-2/FLU/RSV testing.  Fact Sheet for Patients: EntrepreneurPulse.com.au  Fact Sheet for Healthcare Providers: IncredibleEmployment.be  This test is not yet approved or cleared by the [place] FDA and has been authorized for detection and/or diagnosis of SARS-CoV-2 by FDA under an Emergency Use Authorization (EUA). This EUA will remain in effect (meaning this test can be used) for the duration of the COVID-19 declaration under Section 564(b)(1) of the Act, 21  U.S.C. section 360bbb-3(b)(1), unless the authorization is terminated or revoked.  Performed at [facility], [place] [address] [place], [place] [address]     Radiology Reports DG Chest 1 View  Result Date: 09/10/2020 CLINICAL DATA:  Shortness of breath EXAM: CHEST  1 VIEW COMPARISON:  06/30/2018 FINDINGS: Cardiac shadow is within normal limits. Postsurgical changes are seen. The lungs are hyperinflated. No focal infiltrate or sizable effusion is seen. No bony abnormality is noted. IMPRESSION: COPD without acute abnormality. Electronically Signed   By: Inez Catalina [name]   On: 09/10/2020 13:45   CT Head Wo Contrast  Result Date: 09/10/2020 CLINICAL DATA:  Recent head trauma, initial encounter EXAM: CT HEAD WITHOUT CONTRAST TECHNIQUE: Contiguous axial images were obtained from the base of the skull through the vertex without intravenous contrast. COMPARISON:  None. FINDINGS: Brain: No evidence of acute infarction, hemorrhage, hydrocephalus, extra-axial collection or mass lesion/mass effect. Chronic atrophic and ischemic changes are noted. Vascular: No hyperdense vessel or unexpected calcification. Skull: Normal. Negative for fracture or focal lesion. Sinuses/Orbits: No acute finding. Other: None. IMPRESSION: Chronic atrophic and ischemic changes without acute abnormality. Electronically Signed   By: Inez Catalina [name]   On: 09/10/2020 14:28   CT CHEST WO CONTRAST  Result Date: 09/10/2020 CLINICAL DATA:  Respiratory failure.  COVID-19 positive.  Hypoxia. EXAM: CT CHEST WITHOUT CONTRAST TECHNIQUE: Multidetector CT imaging of the chest was performed following the standard protocol without IV contrast. COMPARISON:  None. FINDINGS: Cardiovascular: Heart is normal size. Patient is post median sternotomy/CABG. Mild ectasia of the ascending thoracic aorta measuring 3.7 cm. There is calcified plaque throughout the thoracic aorta. Remaining vascular structures are unremarkable. Mediastinum/Nodes: No  mediastinal or hilar adenopathy. Remaining mediastinal structures are normal. Lungs/Pleura: Lungs are hyperexpanded with centrilobular emphysematous disease. Biapical pleural thickening right worse than left. There is a 1 cm nodule opacity over the posteromedial right lower lobe with subtle adjacent possible hazy nodular opacification 7-8 mm nodule over the more superior aspect of the left lower lobe. Mild linear density with focal nodularity over  the right middle lobe medially. No effusion. Airways are normal. Upper Abdomen: Calcified plaque over the abdominal aorta. No acute findings Musculoskeletal: Degenerative change of the spine. IMPRESSION: 1. Emphysematous disease with biapical pleural thickening right worse than left. 1 cm nodule opacity over the posteromedial right lower lobe with subtle adjacent hazy nodular opacification. 7-8 mm nodule over the more superior aspect of the left lower lobe. Findings may be seen with atypical infection versus inflammatory process and less likely underlying neoplastic/metastatic disease. Recommend follow-up CT 6-8 weeks. 2. Emphysema and aortic atherosclerosis. Mild dilatation of the ascending thoracic aorta measuring 3.7 cm. Recommend annual imaging followup by CTA or MRA. This recommendation follows 2010 ACCF/AHA/AATS/ACR/ASA/SCA/SCAI/SIR/STS/SVM Guidelines for the Diagnosis and Management of Patients with Thoracic Aortic Disease. Circulation.2010; [id number]. Aortic aneurysm NOS (ICD10-I71.9). Aortic Atherosclerosis (ICD10-I70.0) and Emphysema (ICD10-J43.9). Electronically Signed   By: Marin Olp [name]   On: 09/10/2020 18:08   CT Cervical Spine Wo Contrast  Result Date: 09/10/2020 CLINICAL DATA:  Recent trauma with neck pain, initial encounter EXAM: CT CERVICAL SPINE WITHOUT CONTRAST TECHNIQUE: Multidetector CT imaging of the cervical spine was performed without intravenous contrast. Multiplanar CT image reconstructions were also generated. COMPARISON:  None.  FINDINGS: Alignment: Within normal limits. Skull base and vertebrae: 7 cervical segments are well visualized. Vertebral body height is well maintained. Multilevel osteophytic changes and facet hypertrophic changes are seen. No acute fracture or acute facet abnormality is noted. The odontoid is within normal limits. Soft tissues and spinal canal: Surrounding soft tissue structures are within normal limits with the exception of vascular calcifications. Upper chest: Pleural and parenchymal scarring is noted in the apices bilaterally. Other: None IMPRESSION: Multilevel degenerative change without acute abnormality. Electronically Signed   By: Inez Catalina [name]   On: 09/10/2020 14:32   DG Chest Port 1 View  Result Date: 09/13/2020 CLINICAL DATA:  Shortness of breath.  Reported COVID-19 positive EXAM: PORTABLE CHEST 1 VIEW COMPARISON:  Chest radiograph and chest CT September 10, 2020 FINDINGS: There are areas of underlying fibrotic change. There is ill-defined airspace opacity superimposed on apparent fibrotic change in the lower lobe regions. No consolidation. The heart size is normal. The pulmonary vascularity is stable and appears consistent with underlying emphysematous change. Patient is status post coronary artery bypass grafting. No adenopathy. There is aortic atherosclerosis. No appreciable bone lesions. IMPRESSION: The appearance is felt to be indicative of ill-defined airspace opacity in the lower lung regions superimposed on underlying fibrotic type change. Underlying emphysematous change noted with decreased vascularity in the upper lobes, stable. Stable cardiac silhouette. Status post coronary artery bypass grafting. Aortic Atherosclerosis (ICD10-I70.0) and Emphysema (ICD10-J43.9). Electronically Signed   By: Lowella Grip III [name]   On: 09/13/2020 08:46   ECHOCARDIOGRAM COMPLETE  Result Date: 09/11/2020    ECHOCARDIOGRAM REPORT   Patient Name:   RANBIR YOHEY [facility] Date of Exam: 09/11/2020 Medical  Rec #:  [id number]        Height:       68.0 in Accession #:    [id number]       Weight:       155.0 lb Date of Birth:  [birth year]       BSA:          1.834 m Patient Age:    63 years         BP:           135/71 mmHg Patient Gender: M  HR:           77 bpm. Exam Location:  Inpatient Procedure: 2D Echo, Cardiac Doppler and Color Doppler Indications:    I50.40* Unspecified combined systolic (congestive) and diastolic                 (congestive) heart failure  History:        Patient has prior history of Echocardiogram examinations, most                 recent 01/02/2013. CHF, CAD, Abnormal ECG, COPD, Arrythmias:SVT,                 Signs/Symptoms:Shortness of Breath, Dyspnea, Altered Mental                 Status and Alzheimer's; Risk Factors:Dyslipidemia. Covid                 positive.  Sonographer:    Roseanna Rainbow RDCS Referring Phys: [place] Comments: Technically difficult study due to poor echo windows. Patient could not turn. Study interrupted to answer phone for endo. IMPRESSIONS  1. Left ventricular ejection fraction, by estimation, is 55 to 60%. The left ventricle has normal function. Abnormal sepal motion, no other regional wall motion abnormalities noted. There is mild concentric left ventricular hypertrophy. Left ventricular  diastolic parameters are indeterminate.  2. Right ventricular systolic function is normal. The right ventricular size is normal. Mildly increased right ventricular wall thickness.  3. The mitral valve is grossly normal. No evidence of mitral valve regurgitation.  4. The aortic valve was not well visualized. There is mild calcification of the aortic valve. Aortic valve regurgitation is not visualized. No aortic stenosis is present.  5. Aortic dilatation noted. There is mild dilatation of the aortic root, measuring 40 mm.  6. The inferior vena cava is normal in size with greater than 50% respiratory variability, suggesting right atrial pressure of  3 mmHg. Comparison(s): A prior study was performed on 12/23/2012. Prior images reviewed side by side. Compared to prior, increase in aortic root size. FINDINGS  Left Ventricle: Left ventricular ejection fraction, by estimation, is 55 to 60%. The left ventricle has normal function. The left ventricle has no regional wall motion abnormalities. The left ventricular internal cavity size was normal in size. There is  mild concentric left ventricular hypertrophy. Left ventricular diastolic parameters are indeterminate. Right Ventricle: The right ventricular size is normal. Mildly increased right ventricular wall thickness. Right ventricular systolic function is normal. Left Atrium: Left atrial size was normal in size. Right Atrium: Right atrial size was normal in size. Pericardium: The pericardium was not well visualized. Mitral Valve: The mitral valve is grossly normal. No evidence of mitral valve regurgitation. Tricuspid Valve: The tricuspid valve is grossly normal. Tricuspid valve regurgitation is not demonstrated. Aortic Valve: The aortic valve was not well visualized. There is mild calcification of the aortic valve. Aortic valve regurgitation is not visualized. No aortic stenosis is present. Pulmonic Valve: The pulmonic valve was not well visualized. Pulmonic valve regurgitation is not visualized. Aorta: Aortic dilatation noted. There is mild dilatation of the aortic root, measuring 40 mm. Venous: The inferior vena cava is normal in size with greater than 50% respiratory variability, suggesting right atrial pressure of 3 mmHg. IAS/Shunts: The atrial septum is grossly normal.  LEFT VENTRICLE PLAX 2D LVIDd:         4.05 cm     Diastology LVIDs:  2.63 cm     LV e' medial:    7.29 cm/s LV PW:         1.10 cm     LV E/e' medial:  9.1 LV IVS:        1.10 cm     LV e' lateral:   8.27 cm/s LVOT diam:     2.30 cm     LV E/e' lateral: 8.0 LV SV:         94 LV SV Index:   51 LVOT Area:     4.15 cm  LV Volumes (MOD) LV vol  d, MOD A2C: 32.1 ml LV vol d, MOD A4C: 54.6 ml LV vol s, MOD A2C: 10.3 ml LV vol s, MOD A4C: 12.4 ml LV SV MOD A2C:     21.8 ml LV SV MOD A4C:     54.6 ml LV SV MOD BP:      32.1 ml RIGHT VENTRICLE             IVC RV S prime:     14.50 cm/s  IVC diam: 1.80 cm TAPSE (M-mode): 1.7 cm LEFT ATRIUM           Index      RIGHT ATRIUM          Index LA diam:      2.40 cm 1.31 cm/m RA Area:     9.59 cm LA Vol (A2C): 14.7 ml 8.02 ml/m RA Volume:   18.60 ml 10.14 ml/m LA Vol (A4C): 8.7 ml  4.72 ml/m  AORTIC VALVE LVOT Vmax:   116.00 cm/s LVOT Vmean:  70.400 cm/s LVOT VTI:    0.227 m  AORTA Ao Root diam: 4.00 cm MITRAL VALVE MV Area (PHT): 2.20 cm     SHUNTS MV Decel Time: 345 msec     Systemic VTI:  0.23 m MV E velocity: 66.20 cm/s   Systemic Diam: 2.30 cm MV A velocity: 114.00 cm/s MV E/A ratio:  0.58 Riley Lam MD Electronically signed by Riley Lam MD Signature Date/Time: 09/11/2020/1:12:15 PM    Final    VAS [place] LOWER EXTREMITY VENOUS (DVT)  Result Date: 09/13/2020  Lower Venous DVT Study Indications: Swelling.  Risk Factors: COVID 19 positive. Comparison Study: No prior studies. Performing Technologist: Chanda Busing RVT  Examination Guidelines: A complete evaluation includes B-mode imaging, spectral Doppler, color Doppler, and power Doppler as needed of all accessible portions of each vessel. Bilateral testing is considered an integral part of a complete examination. Limited examinations for reoccurring indications may be performed as noted. The reflux portion of the exam is performed with the patient in reverse Trendelenburg.  +---------+---------------+---------+-----------+----------+--------------+ RIGHT    CompressibilityPhasicitySpontaneityPropertiesThrombus Aging +---------+---------------+---------+-----------+----------+--------------+ CFV      Full           Yes      Yes                                  +---------+---------------+---------+-----------+----------+--------------+ SFJ      Full                                                        +---------+---------------+---------+-----------+----------+--------------+ FV Prox  Full                                                        +---------+---------------+---------+-----------+----------+--------------+  FV Mid   Full                                                        +---------+---------------+---------+-----------+----------+--------------+ FV DistalFull                                                        +---------+---------------+---------+-----------+----------+--------------+ PFV      Full                                                        +---------+---------------+---------+-----------+----------+--------------+ POP      Full           Yes      Yes                                 +---------+---------------+---------+-----------+----------+--------------+ PTV      Full                                                        +---------+---------------+---------+-----------+----------+--------------+ PERO     Full                                                        +---------+---------------+---------+-----------+----------+--------------+   +---------+---------------+---------+-----------+----------+--------------+ LEFT     CompressibilityPhasicitySpontaneityPropertiesThrombus Aging +---------+---------------+---------+-----------+----------+--------------+ CFV      Full           Yes      Yes                                 +---------+---------------+---------+-----------+----------+--------------+ SFJ      Full                                                        +---------+---------------+---------+-----------+----------+--------------+ FV Prox  Full                                                         +---------+---------------+---------+-----------+----------+--------------+ FV Mid   Full                                                        +---------+---------------+---------+-----------+----------+--------------+  FV DistalFull                                                        +---------+---------------+---------+-----------+----------+--------------+ PFV      Full                                                        +---------+---------------+---------+-----------+----------+--------------+ POP      Full           Yes      Yes                                 +---------+---------------+---------+-----------+----------+--------------+ PTV      Full                                                        +---------+---------------+---------+-----------+----------+--------------+ PERO     Full                                                        +---------+---------------+---------+-----------+----------+--------------+     Summary: RIGHT: - There is no evidence of deep vein thrombosis in the lower extremity.  - No cystic structure found in the popliteal fossa.  LEFT: - There is no evidence of deep vein thrombosis in the lower extremity.  - No cystic structure found in the popliteal fossa.  *See table(s) above for measurements and observations. Electronically signed by Jamelle Haring on 09/13/2020 at 5:38:10 PM.    Final

## 2020-09-15 ENCOUNTER — Other Ambulatory Visit: Payer: Self-pay

## 2020-09-15 DIAGNOSIS — U071 COVID-19: Secondary | ICD-10-CM | POA: Diagnosis not present

## 2020-09-15 DIAGNOSIS — J431 Panlobular emphysema: Secondary | ICD-10-CM | POA: Diagnosis not present

## 2020-09-15 DIAGNOSIS — I5032 Chronic diastolic (congestive) heart failure: Secondary | ICD-10-CM | POA: Diagnosis not present

## 2020-09-15 DIAGNOSIS — N179 Acute kidney failure, unspecified: Secondary | ICD-10-CM | POA: Diagnosis not present

## 2020-09-15 LAB — CBC WITH DIFFERENTIAL/PLATELET
Abs Immature Granulocytes: 0.07 10*3/uL (ref 0.00–0.07)
Basophils Absolute: 0 10*3/uL (ref 0.0–0.1)
Basophils Relative: 0 %
Eosinophils Absolute: 0 10*3/uL (ref 0.0–0.5)
Eosinophils Relative: 0 %
HCT: 35.1 % — ABNORMAL LOW (ref 39.0–52.0)
Hemoglobin: 11.6 g/dL — ABNORMAL LOW (ref 13.0–17.0)
Immature Granulocytes: 1 %
Lymphocytes Relative: 9 %
Lymphs Abs: 0.6 10*3/uL — ABNORMAL LOW (ref 0.7–4.0)
MCH: 30.2 pg (ref 26.0–34.0)
MCHC: 33 g/dL (ref 30.0–36.0)
MCV: 91.4 fL (ref 80.0–100.0)
Monocytes Absolute: 0.5 10*3/uL (ref 0.1–1.0)
Monocytes Relative: 6 %
Neutro Abs: 6.2 10*3/uL (ref 1.7–7.7)
Neutrophils Relative %: 84 %
Platelets: 104 10*3/uL — ABNORMAL LOW (ref 150–400)
RBC: 3.84 MIL/uL — ABNORMAL LOW (ref 4.22–5.81)
RDW: 14.6 % (ref 11.5–15.5)
WBC: 7.4 10*3/uL (ref 4.0–10.5)
nRBC: 0 % (ref 0.0–0.2)

## 2020-09-15 LAB — COMPREHENSIVE METABOLIC PANEL
ALT: 40 U/L (ref 0–44)
AST: 45 U/L — ABNORMAL HIGH (ref 15–41)
Albumin: 2.8 g/dL — ABNORMAL LOW (ref 3.5–5.0)
Alkaline Phosphatase: 81 U/L (ref 38–126)
Anion gap: 11 (ref 5–15)
BUN: 63 mg/dL — ABNORMAL HIGH (ref 8–23)
CO2: 18 mmol/L — ABNORMAL LOW (ref 22–32)
Calcium: 8.7 mg/dL — ABNORMAL LOW (ref 8.9–10.3)
Chloride: 116 mmol/L — ABNORMAL HIGH (ref 98–111)
Creatinine, Ser: 1.88 mg/dL — ABNORMAL HIGH (ref 0.61–1.24)
GFR, Estimated: 35 mL/min — ABNORMAL LOW (ref >60)
Glucose, Bld: 138 mg/dL — ABNORMAL HIGH (ref 70–99)
Potassium: 5.1 mmol/L (ref 3.5–5.1)
Sodium: 145 mmol/L (ref 135–145)
Total Bilirubin: 0.8 mg/dL (ref 0.3–1.2)
Total Protein: 6.5 g/dL (ref 6.5–8.1)

## 2020-09-15 LAB — GLUCOSE, CAPILLARY
Glucose-Capillary: 107 mg/dL — ABNORMAL HIGH (ref 70–99)
Glucose-Capillary: 190 mg/dL — ABNORMAL HIGH (ref 70–99)
Glucose-Capillary: 176 mg/dL — ABNORMAL HIGH (ref 70–99)
Glucose-Capillary: 153 mg/dL — ABNORMAL HIGH (ref 70–99)

## 2020-09-15 LAB — D-DIMER, QUANTITATIVE: D-Dimer, Quant: 1.47 ug/mL-FEU — ABNORMAL HIGH (ref 0.00–0.50)

## 2020-09-15 LAB — LACTATE DEHYDROGENASE: LDH: 383 U/L — ABNORMAL HIGH (ref 98–192)

## 2020-09-15 LAB — MAGNESIUM: Magnesium: 2.3 mg/dL (ref 1.7–2.4)

## 2020-09-15 LAB — FERRITIN: Ferritin: 239 ng/mL (ref 24–336)

## 2020-09-15 LAB — C-REACTIVE PROTEIN: CRP: 9.2 mg/dL — ABNORMAL HIGH (ref <1.0)

## 2020-09-15 MED ORDER — LEVALBUTEROL TARTRATE 45 MCG/ACT IN AERO
6.0000 | INHALATION_SPRAY | Freq: Four times a day (QID) | RESPIRATORY_TRACT | Status: DC
  Administered 2020-09-15 – 2020-09-16 (×5): 6 via RESPIRATORY_TRACT
  Filled 2020-09-15: qty 15

## 2020-09-15 NOTE — Progress Notes (Signed)
  Speech Language Pathology Treatment: Dysphagia  Patient Details Name: Troy [name] MRN: [id number] DOB: 03/08/1933 Today's Date: 09/15/2020 Time: 9563-8756 SLP Time Calculation (min) (ACUTE ONLY): 19 min  Assessment / Plan / Recommendation Clinical Impression  Pt observed with lunch tray, not interested in pureed foods, but willing to be fed 1/2 a mgic cup and some sips of nectar thick tea. Even with controlled sips, pt had an immediate soft cough after every sip, but also observed with magic cup. SLp encouraged coughing and airway clearance and cough likely ejected penetrated residue. Pts tolerance of diet tenuous but appearing stable at this time. Will continue to monitor.   HPI HPI: Pt is an 86 y.o. male with medical history significant of COPD, CAD status post CABG, HTN, HLD, dementia, DM who presented the emergency department with reported fall and shortness of breath. Pt found to have acute hypoxic respiratory failure due to COVID-19 pneumonia. CXR 12/29: Ill-defined airspace opacity in the lower lung region superimposed on underlying fibrosis. Per referring MD's note, pt appeared "to be accumulating some secretions and wheezing/gurgling at times." and SLP was consulted "to make sure no overt aspiration".      SLP Plan          Recommendations  Diet recommendations: Dysphagia 1 (puree);Nectar-thick liquid Liquids provided via: Cup;No straw Medication Administration: Crushed with puree Supervision: Staff to assist with self feeding;Full supervision/cueing for compensatory strategies Compensations: Slow rate;Small sips/bites Postural Changes and/or Swallow Maneuvers: Upright 30-60 min after meal                Follow up Recommendations: Skilled Nursing facility SLP Visit Diagnosis: Dysphagia, unspecified (R13.10)       GO               Harlon Ditty, MA CCC-SLP  Acute Rehabilitation Services Pager [phone] Office [phone]  Claudine Mouton 09/15/2020, 2:34 PM

## 2020-09-15 NOTE — Progress Notes (Signed)
PROGRESS NOTE                                                                                                                                                                                                             Patient Demographics:    Troy [name], is a 86 y.o. male, DOB - 09-24-1932, [id number]  Outpatient Primary MD for the patient is [name], MD   Admit date - 09/10/2020   LOS - 5  Chief Complaint  Patient presents with  . Shortness of Breath       Brief Narrative: Patient is a 86 y.o. male with PMHx of COPD, CAD s/p CABG, HTN, HLD, DM-2, HL, tobacco use-presented with fall/shortness of breath-found to have acute hypoxic respiratory failure due to COVID-19 pneumonia.  Hospital course complicated by development of A. fib with RVR.  COVID-19 vaccinated status: Unvaccinated  Significant Events: 12/26>> Admit to [facility] for hypoxia due to COVID-19 pneumonia 12/28>> A. fib with RVR  Significant studies: 12/26>> CT head: No acute abnormality 12/26>> CT C-spine: No acute abnormality 12/26>> CT chest: Emphysematous changes-biapical pleural thickening.  1 cm right lower lobe nodule, 7-8 mm left lower lobe nodule.  Mild dilatation of the ascending aortic aorta measuring 3.7 cm 12/27>> Echo: EF 55-60% 12/29>> chest x-ray: Ill-defined airspace opacity in the lower lung region superimposed on underlying fibrosis. 12/29>> lower extremity Doppler: No DVT  COVID-19 medications: Steroids: 12/26>> Remdesivir: 12/26>>12/30 Baricitinib: 12/28>>  Antibiotics: Unasyn: 12/30 x 5 days  Microbiology data: None  Procedures: None  Consults: None  DVT prophylaxis: apixaban (ELIQUIS) tablet 2.5 mg Start: 09/13/20 1100 apixaban (ELIQUIS) tablet 2.5 mg     Subjective:   Feels better-still wheezing but oxygen requirements are coming down.  Less gurgling/accumulation of secretions today compared to the  past few days.   Assessment  & Plan :   Acute Hypoxic Resp Failure due to Covid 19 Viral pneumonia and aspiration pneumonia: Had severe hypoxemia but gradually improving-Down to 2 L of oxygen today-continues to have significant wheezing-remains on steroids/baricitinib-started on Unasyn for aspiration pneumonia-we will plan a 5-day course.  Appreciate SLP evaluation-downgraded to a dysphagia 2 diet today.  Fever: afebrile O2 requirements:  SpO2: 93 % O2 Flow Rate (L/min): 2 L/min   COVID-19 Labs: Recent Labs    09/13/20 0103 09/14/20 0049 09/15/20 0103  DDIMER 2.43*  1.87* 1.47*  FERRITIN 159 268 239  LDH 302* 322* 383*  CRP 18.9* 14.4* 9.2*       Component Value Date/Time   BNP 447.7 (H) 09/14/2020 0049    Recent Labs  Lab 09/11/20 0508 09/14/20 0049  PROCALCITON 4.51 1.73    Lab Results  Component Value Date   SARSCOV2NAA POSITIVE (A) 09/10/2020    Prone/Incentive Spirometry: encouraged incentive spirometry use 3-4/hour.  Atrial fibrillation with RVR: Remains in A. fib-rate better controlled-continue metoprolol-remains on Eliquis.  CHA2DS2-VASc of around 4.  COPD exacerbation: Continues to have wheezing-suspect bronchospasm triggered by microaspiration.  Continue bronchodilators-on empiric Unasyn for presumed aspiration pneumonitis.  Although wheezing continues-significant improvement in oxygen requirement over the past 24 hours-Down to just 2 L today..  Chronic diastolic heart failure: No signs of volume overload-monitor closely-May require diuretics over the next few days-we will hold off today as oxygen requirements are better.  AKI on CKD stage IIIa: AKI hemodynamically mediated--creatinine had improved-but seems to have plateaued over the past few days.  Continue to avoid nephrotoxic agents-volume status is reasonable.  Continue to follow closely.    CAD-s/p CABG: No anginal symptoms-continue statin/beta-blocker-no longer on ASA as patient on  anticoagulation  HTN: BP controlled--stopping Imdur-continue metoprolol  HLD: Continue statin  DM-2: CBGs controlled-but a.m. CBGs on the lower side-decrease Lantus to 6 units-continue SSI and follow.    Recent Labs    09/14/20 2108 09/15/20 0813 09/15/20 1234  GLUCAP 159* 107* 190*    Chronic thrombocytopenia: Some mild worsening due to COVID-19-follow closely.  GERD: Continue PPI  Tobacco abuse: Continue transdermal nicotine  Dementia with mild delirium: Appears to be mild-continue supportive care-expect some amount of delirium during this hospital stay.  Remains on Aricept, Namenda, trazodone and Zoloft  Lung nodule: Seen incidentally on CT chest-stable for outpatient follow-up by PCP   Palliative care: DNR in place-worsening hypoxemia in the past few days-concern for aspiration-has significant pre-existing COPD/fibrosis of his lungs-hopefully he will slowly improve-however he is not a good candidate for aggressive medical care.  Spoke with patient's son [name] over the phone on 12/29-he is aware that if patient worsens significantly-we may need to pursue hospice care.  Per patient's son Don-patient has expressed his views very clearly-that if it was his "time"-he did not want heroics.   ABG:    Component Value Date/Time   HCO3 15.8 (L) 09/10/2020 1354   TCO2 17 (L) 09/10/2020 1354   ACIDBASEDEF 12.0 (H) 09/10/2020 1354   O2SAT 51.0 09/10/2020 1354    Vent Settings: N/A    Condition - Extremely Guarded  Family Communication  :  Son [name] [phone]) updated over the phone on 12/31  Code Status :  DNR  Diet :  Diet Order            DIET DYS 2 Room service appropriate? Yes with Assist; Fluid consistency: Nectar Thick  Diet effective now                  Disposition Plan  :   Status is: Inpatient  Remains inpatient appropriate because:Inpatient level of care appropriate due to severity of illness   Dispo: The patient is from: Home              Anticipated  d/c is to: Home              Anticipated d/c date is: > 3 days              Patient  currently is not medically stable to d/c.    Barriers to discharge: Hypoxia requiring O2 supplementation/complete 5 days of IV Remdesivir  Antimicorbials  :    Anti-infectives (From admission, onward)   Start     Dose/Rate Route Frequency Ordered Stop   09/14/20 1200  Ampicillin-Sulbactam (UNASYN) 3 g in sodium chloride 0.9 % 100 mL IVPB        3 g 200 mL/hr over 30 Minutes Intravenous Every 12 hours 09/14/20 1053     09/11/20 1000  remdesivir 100 mg in sodium chloride 0.9 % 100 mL IVPB       "Followed by" Linked Group Details   100 mg 200 mL/hr over 30 Minutes Intravenous Daily 09/10/20 1533 09/14/20 0947   09/10/20 1600  remdesivir 200 mg in sodium chloride 0.9% 250 mL IVPB       "Followed by" Linked Group Details   200 mg 580 mL/hr over 30 Minutes Intravenous Once 09/10/20 1533 09/10/20 1819      Inpatient Medications  Scheduled Meds: . apixaban  2.5 mg Oral BID  . vitamin C  500 mg Oral Daily  . baricitinib  2 mg Oral Daily  . donepezil  10 mg Oral QHS  . insulin aspart  0-20 Units Subcutaneous TID WC  . insulin glargine  6 Units Subcutaneous QHS  . levalbuterol  4 puff Inhalation Q6H  . memantine  10 mg Oral BID  . methylPREDNISolone (SOLU-MEDROL) injection  40 mg Intravenous Q24H  . metoprolol tartrate  25 mg Oral Q8H  . nicotine  14 mg Transdermal Daily  . pantoprazole  40 mg Oral BID  . senna  1 tablet Oral BID  . sertraline  50 mg Oral Daily  . simvastatin  40 mg Oral QHS  . umeclidinium bromide  1 puff Inhalation Daily  . vitamin B-12  500 mcg Oral Daily  . zinc sulfate  220 mg Oral Daily   Continuous Infusions: . ampicillin-sulbactam (UNASYN) IV 3 g (09/15/20 1023)  . dextrose 5 % and 0.9% NaCl 10 mL/hr at 09/13/20 0725   PRN Meds:.acetaminophen, albuterol, guaiFENesin-dextromethorphan, haloperidol lactate, metoprolol tartrate, Resource ThickenUp Clear, traZODone   Time  Spent in minutes  35     See all Orders from today for further details   Oren Binet M.D on 09/15/2020 at 3:12 PM  To page go to [email or web address] - use universal password  [facility] -  Office  [phone]    Objective:   Vitals:   09/15/20 0016 09/15/20 0406 09/15/20 0814 09/15/20 1420  BP: 126/85 122/79 124/90 118/75  Pulse: 82 98 96 (!) 103  Resp: 20 18 19    Temp: 98.1 F (36.7 C) 98.1 F (36.7 C) (!) 97.5 F (36.4 C)   TempSrc: Oral Oral Oral   SpO2:  98% 93%   Weight:  68.4 kg    Height:        Wt Readings from Last 3 Encounters:  09/15/20 68.4 kg  04/10/20 71.7 kg  01/24/20 72.4 kg     Intake/Output Summary (Last 24 hours) at 09/15/2020 1512 Last data filed at 09/15/2020 1400 Gross per 24 hour  Intake 445.1 ml  Output 500 ml  Net -54.9 ml     Physical Exam Gen Exam:Alert awake-not in any distress HEENT:atraumatic, normocephalic Chest: Good air-still with significant wheezing. CVS:S1S2 regular Abdomen:soft non tender, non distended Extremities:no edema Neurology: Non focal Skin: no rash   Data Review:    CBC Recent Labs  Lab 09/11/20 0509 09/12/20  0110 09/13/20 0103 09/14/20 0049 09/15/20 0103  WBC 10.2 8.5 7.6 7.8 7.4  HGB 11.9* 12.7* 12.1* 12.0* 11.6*  HCT 34.7* 39.1 36.5* 36.7* 35.1*  PLT 82* 90* 85* 107* 104*  MCV 92.5 93.3 92.2 92.4 91.4  MCH 31.7 30.3 30.6 30.2 30.2  MCHC 34.3 32.5 33.2 32.7 33.0  RDW 14.5 14.4 14.4 14.6 14.6  LYMPHSABS 0.9 0.6* 0.7 0.8 0.6*  MONOABS 0.3 0.3 0.3 0.3 0.5  EOSABS 0.0 0.0 0.0 0.0 0.0  BASOSABS 0.0 0.0 0.0 0.0 0.0    Chemistries  Recent Labs  Lab 09/11/20 0509 09/11/20 0913 09/11/20 1035 09/12/20 0110 09/13/20 0103 09/14/20 0049 09/15/20 0103  NA 135  --  141 142 140 141 145  K 5.7*  --  4.7 4.8 4.9 4.8 5.1  CL 109  --  114* 112* 114* 112* 116*  CO2 15*  --  16* 20* 16* 17* 18*  GLUCOSE 130*  --  124* 117* 113* 113* 138*  BUN 66*  --  62* 61* 51* 60* 63*  CREATININE  2.41*  --  2.17* 1.99* 1.57* 1.85* 1.88*  CALCIUM 8.2*  --  8.3* 8.5* 8.4* 8.6* 8.7*  MG  --  2.1  --  2.1 2.1 2.2 2.3  AST 101*  --   --  106* 83* 61* 45*  ALT 35  --   --  43 42 40 40  ALKPHOS 86  --   --  85 69 67 81  BILITOT 1.2  --   --  0.7 0.5 0.6 0.8   ------------------------------------------------------------------------------------------------------------------ No results for input(s): CHOL, HDL, LDLCALC, TRIG, CHOLHDL, LDLDIRECT in the last 72 hours.  Lab Results  Component Value Date   HGBA1C 6.1 (H) 09/11/2020   ------------------------------------------------------------------------------------------------------------------ No results for input(s): TSH, T4TOTAL, T3FREE, THYROIDAB in the last 72 hours.  Invalid input(s): FREET3 ------------------------------------------------------------------------------------------------------------------ Recent Labs    09/14/20 0049 09/15/20 0103  FERRITIN 268 239    Coagulation profile No results for input(s): INR, PROTIME in the last 168 hours.  Recent Labs    09/14/20 0049 09/15/20 0103  DDIMER 1.87* 1.47*    Cardiac Enzymes No results for input(s): CKMB, TROPONINI, MYOGLOBIN in the last 168 hours.  Invalid input(s): CK ------------------------------------------------------------------------------------------------------------------    Component Value Date/Time   BNP 447.7 (H) 09/14/2020 0049    Micro Results Recent Results (from the past 240 hour(s))  Resp Panel by RT-PCR (Flu A&B, Covid) Nasopharyngeal Swab     Status: Abnormal   Collection Time: 09/10/20  1:27 PM   Specimen: Nasopharyngeal Swab; Nasopharyngeal(NP) swabs in vial transport medium  Result Value Ref Range Status   SARS Coronavirus 2 by RT PCR POSITIVE (A) NEGATIVE Final    Comment: RESULT CALLED TO, READ BACK BY AND VERIFIED WITH: JOE B. [id number] (NOTE) SARS-CoV-2 target nucleic acids are DETECTED.  The SARS-CoV-2 RNA is generally  detectable in upper respiratory specimens during the acute phase of infection. Positive results are indicative of the presence of the identified virus, but do not rule out bacterial infection or co-infection with other pathogens not detected by the test. Clinical correlation with patient history and other diagnostic information is necessary to determine patient infection status. The expected result is Negative.  Fact Sheet for Patients: EntrepreneurPulse.com.au  Fact Sheet for Healthcare Providers: IncredibleEmployment.be  This test is not yet approved or cleared by the [place] FDA and  has been authorized for detection and/or diagnosis of SARS-CoV-2 by FDA under an Emergency Use Authorization (EUA).  This EUA will remain in effect (meaning this test can be used) for the  duration of  the COVID-19 declaration under Section 564(b)(1) of the Act, 21 U.S.C. section 360bbb-3(b)(1), unless the authorization is terminated or revoked sooner.     Influenza A by PCR NEGATIVE NEGATIVE Final   Influenza B by PCR NEGATIVE NEGATIVE Final    Comment: (NOTE) The Xpert Xpress SARS-CoV-2/FLU/RSV plus assay is intended as an aid in the diagnosis of influenza from Nasopharyngeal swab specimens and should not be used as a sole basis for treatment. Nasal washings and aspirates are unacceptable for Xpert Xpress SARS-CoV-2/FLU/RSV testing.  Fact Sheet for Patients: EntrepreneurPulse.com.au  Fact Sheet for Healthcare Providers: IncredibleEmployment.be  This test is not yet approved or cleared by the [place] FDA and has been authorized for detection and/or diagnosis of SARS-CoV-2 by FDA under an Emergency Use Authorization (EUA). This EUA will remain in effect (meaning this test can be used) for the duration of the COVID-19 declaration under Section 564(b)(1) of the Act, 21 U.S.C. section 360bbb-3(b)(1), unless the  authorization is terminated or revoked.  Performed at [facility], [place] [address], [place], [place] [address]     Radiology Reports DG Chest 1 View  Result Date: 09/10/2020 CLINICAL DATA:  Shortness of breath EXAM: CHEST  1 VIEW COMPARISON:  06/30/2018 FINDINGS: Cardiac shadow is within normal limits. Postsurgical changes are seen. The lungs are hyperinflated. No focal infiltrate or sizable effusion is seen. No bony abnormality is noted. IMPRESSION: COPD without acute abnormality. Electronically Signed   By: Inez Catalina [name]   On: 09/10/2020 13:45   CT Head Wo Contrast  Result Date: 09/10/2020 CLINICAL DATA:  Recent head trauma, initial encounter EXAM: CT HEAD WITHOUT CONTRAST TECHNIQUE: Contiguous axial images were obtained from the base of the skull through the vertex without intravenous contrast. COMPARISON:  None. FINDINGS: Brain: No evidence of acute infarction, hemorrhage, hydrocephalus, extra-axial collection or mass lesion/mass effect. Chronic atrophic and ischemic changes are noted. Vascular: No hyperdense vessel or unexpected calcification. Skull: Normal. Negative for fracture or focal lesion. Sinuses/Orbits: No acute finding. Other: None. IMPRESSION: Chronic atrophic and ischemic changes without acute abnormality. Electronically Signed   By: Inez Catalina [name]   On: 09/10/2020 14:28   CT CHEST WO CONTRAST  Result Date: 09/10/2020 CLINICAL DATA:  Respiratory failure.  COVID-19 positive.  Hypoxia. EXAM: CT CHEST WITHOUT CONTRAST TECHNIQUE: Multidetector CT imaging of the chest was performed following the standard protocol without IV contrast. COMPARISON:  None. FINDINGS: Cardiovascular: Heart is normal size. Patient is post median sternotomy/CABG. Mild ectasia of the ascending thoracic aorta measuring 3.7 cm. There is calcified plaque throughout the thoracic aorta. Remaining vascular structures are unremarkable. Mediastinum/Nodes: No mediastinal or hilar adenopathy. Remaining  mediastinal structures are normal. Lungs/Pleura: Lungs are hyperexpanded with centrilobular emphysematous disease. Biapical pleural thickening right worse than left. There is a 1 cm nodule opacity over the posteromedial right lower lobe with subtle adjacent possible hazy nodular opacification 7-8 mm nodule over the more superior aspect of the left lower lobe. Mild linear density with focal nodularity over the right middle lobe medially. No effusion. Airways are normal. Upper Abdomen: Calcified plaque over the abdominal aorta. No acute findings Musculoskeletal: Degenerative change of the spine. IMPRESSION: 1. Emphysematous disease with biapical pleural thickening right worse than left. 1 cm nodule opacity over the posteromedial right lower lobe with subtle adjacent hazy nodular opacification. 7-8 mm nodule over the more superior aspect of the left lower lobe.  Findings may be seen with atypical infection versus inflammatory process and less likely underlying neoplastic/metastatic disease. Recommend follow-up CT 6-8 weeks. 2. Emphysema and aortic atherosclerosis. Mild dilatation of the ascending thoracic aorta measuring 3.7 cm. Recommend annual imaging followup by CTA or MRA. This recommendation follows 2010 ACCF/AHA/AATS/ACR/ASA/SCA/SCAI/SIR/STS/SVM Guidelines for the Diagnosis and Management of Patients with Thoracic Aortic Disease. Circulation.2010; [id number]. Aortic aneurysm NOS (ICD10-I71.9). Aortic Atherosclerosis (ICD10-I70.0) and Emphysema (ICD10-J43.9). Electronically Signed   By: Marin Olp [name]   On: 09/10/2020 18:08   CT Cervical Spine Wo Contrast  Result Date: 09/10/2020 CLINICAL DATA:  Recent trauma with neck pain, initial encounter EXAM: CT CERVICAL SPINE WITHOUT CONTRAST TECHNIQUE: Multidetector CT imaging of the cervical spine was performed without intravenous contrast. Multiplanar CT image reconstructions were also generated. COMPARISON:  None. FINDINGS: Alignment: Within normal limits.  Skull base and vertebrae: 7 cervical segments are well visualized. Vertebral body height is well maintained. Multilevel osteophytic changes and facet hypertrophic changes are seen. No acute fracture or acute facet abnormality is noted. The odontoid is within normal limits. Soft tissues and spinal canal: Surrounding soft tissue structures are within normal limits with the exception of vascular calcifications. Upper chest: Pleural and parenchymal scarring is noted in the apices bilaterally. Other: None IMPRESSION: Multilevel degenerative change without acute abnormality. Electronically Signed   By: Inez Catalina [name]   On: 09/10/2020 14:32   DG Chest Port 1 View  Result Date: 09/13/2020 CLINICAL DATA:  Shortness of breath.  Reported COVID-19 positive EXAM: PORTABLE CHEST 1 VIEW COMPARISON:  Chest radiograph and chest CT September 10, 2020 FINDINGS: There are areas of underlying fibrotic change. There is ill-defined airspace opacity superimposed on apparent fibrotic change in the lower lobe regions. No consolidation. The heart size is normal. The pulmonary vascularity is stable and appears consistent with underlying emphysematous change. Patient is status post coronary artery bypass grafting. No adenopathy. There is aortic atherosclerosis. No appreciable bone lesions. IMPRESSION: The appearance is felt to be indicative of ill-defined airspace opacity in the lower lung regions superimposed on underlying fibrotic type change. Underlying emphysematous change noted with decreased vascularity in the upper lobes, stable. Stable cardiac silhouette. Status post coronary artery bypass grafting. Aortic Atherosclerosis (ICD10-I70.0) and Emphysema (ICD10-J43.9). Electronically Signed   By: Lowella Grip III [name]   On: 09/13/2020 08:46   DG Swallowing Func-Speech Pathology  Result Date: 09/14/2020 Objective Swallowing Evaluation: Type of Study: MBS-Modified Barium Swallow Study  Patient Details Name: JAIKOB ISHIMOTO  MRN: [id number] Date of Birth: 17-Aug-1933 Today's Date: 09/14/2020 Time: SLP Start Time (ACUTE ONLY): [id number] -SLP Stop Time (ACUTE ONLY): 1409 SLP Time Calculation (min) (ACUTE ONLY): 14 min Past Medical History: Past Medical History: Diagnosis Date . Aorto-iliac atherosclerosis ([place]) 08/2015  by xray . Barrett's esophagus   EGD 2012, rpt 2014, longterm PPI . Chronic idiopathic constipation 07/17/2010 . Chronic obstructive pulmonary disease (COPD) ([facility])   spirometry with moderate COPD . Coronary atherosclerosis of artery bypass graft  . Diabetes mellitus type II ~2008 . Gastrointestinal ulcer 04/2011  with hematemesis s/p EGD by [facility] . GERD (gastroesophageal reflux disease)  . Hearing loss  . HLD (hyperlipidemia)  . HNP (herniated nucleus pulposus), lumbar 2013  with compression of L5 nerve root and foot drop, also with lumbar DDD s/p surgery . Hypertrophy of prostate without urinary obstruction and other lower urinary tract symptoms (LUTS)  . Memory loss  . Paroxysmal SVT (supraventricular tachycardia) ([place])   a. 3/14 => converted in ED with adenosine to  AFib => NSR;  b. Echo 4/14:  Mild LVH, mild FBSH, EF 55-60%, Gr 1 DD . Prostate nodule ~2009  Left-benign s/p eval Uro WNL ([place]) . Thrombocytopenia ([place])  . Tobacco use disorder  Past Surgical History: Past Surgical History: Procedure Laterality Date . COLONOSCOPY  03/2009  small hemorrhoids, 2 polyps (hyperplastic and adenomatous), rpt 5 yrs (Dr. Barron Schmid) - pt declined rpt scheduling . COLONOSCOPY  10/2014  2 hyperplastic polyp, hemmorhoids ([place]) . CORONARY ARTERY BYPASS GRAFT  1999  Dr. Roxy Manns . ESOPHAGOGASTRODUODENOSCOPY  05/16/2011  after hematemesis, no lesions found, consistent with barrett's esophagus rpt 2 yrs . ESOPHAGOGASTRODUODENOSCOPY  06/2013  barrett's, small HH, recheck 2-3 yrs ([place]) . HEMORRHOID SURGERY   . LUMBAR LAMINECTOMY/DECOMPRESSION MICRODISCECTOMY  01/31/2012  Procedure: LUMBAR LAMINECTOMY/DECOMPRESSION MICRODISCECTOMY 1 LEVEL;  Surgeon:  Winfield Cunas, MD;  Location: [facility];  Service: Neurosurgery;  Laterality: Left;  LEFT Lumbar Four-Five Diskectomy . NOSE SURGERY   . ROTATOR CUFF REPAIR  04/2010  right (but has bilat) Dr. Latanya Maudlin ([facility]) HPI: Pt is an 86 y.o. male with medical history significant of COPD, CAD status post CABG, HTN, HLD, dementia, DM who presented the emergency department with reported fall and shortness of breath. Pt found to have acute hypoxic respiratory failure due to COVID-19 pneumonia. CXR 12/29: Ill-defined airspace opacity in the lower lung region superimposed on underlying fibrosis. Per referring MD's note, pt appeared "to be accumulating some secretions and wheezing/gurgling at times." and SLP was consulted "to make sure no overt aspiration".  No data recorded Assessment / Plan / Recommendation CHL IP CLINICAL IMPRESSIONS 09/14/2020 Clinical Impression Pt presents with oropharyngeal dysphagia characterized by reduced bolus propulsion, reduced lingual retraction, and a a pharyngeal delay. He demonstrated base of tongue residue, vallecular residue, and pyriform sinus reidue which were reduced with secondary swallows. Greater than 50% of thin liquid boluses was frequently propelled to the pyriform sinuses prior to any attempt at airway closure. Penetration and aspiration (PAS 5, 7) were demonstrated with thin liquids and resulted in weak, ineffective coughing. Penetration (PAS 3) was noted with nectar thick liquids via straw, but no via cup. No functional benefit was noted with a chin tuck posture. Additional trials and compensatory strategy attempts were refused by the pt who stated the he was tired and would not participate further. A dysphagia 2 diet with nectar thick liquids via cup is recommended at this time. SLP will follow for dysphagia treatment. SLP Visit Diagnosis Dysphagia, unspecified (R13.10) Attention and concentration deficit following -- Frontal lobe and executive function deficit following --  Impact on safety and function Moderate aspiration risk   CHL IP TREATMENT RECOMMENDATION 09/14/2020 Treatment Recommendations Therapy as outlined in treatment plan below   Prognosis 09/14/2020 Prognosis for Safe Diet Advancement Good Barriers to Reach Goals Severity of deficits Barriers/Prognosis Comment -- CHL IP DIET RECOMMENDATION 09/14/2020 SLP Diet Recommendations Dysphagia 2 (Fine chop) solids;Nectar thick liquid Liquid Administration via Cup;No straw Medication Administration Whole meds with puree Compensations Slow rate;Small sips/bites Postural Changes Seated upright at 90 degrees;Remain semi-upright after after feeds/meals (Comment)   CHL IP OTHER RECOMMENDATIONS 09/14/2020 Recommended Consults -- Oral Care Recommendations Oral care BID Other Recommendations Order thickener from pharmacy   CHL IP FOLLOW UP RECOMMENDATIONS 09/14/2020 Follow up Recommendations (No Data)   CHL IP FREQUENCY AND DURATION 09/14/2020 Speech Therapy Frequency (ACUTE ONLY) min 2x/week Treatment Duration 2 weeks      CHL IP ORAL PHASE 09/14/2020 Oral Phase Impaired Oral - Pudding Teaspoon --  Oral - Pudding Cup -- Oral - Honey Teaspoon -- Oral - Honey Cup Reduced posterior propulsion Oral - Nectar Teaspoon Premature spillage;Decreased bolus cohesion Oral - Nectar Cup Premature spillage;Decreased bolus cohesion Oral - Nectar Straw Premature spillage;Decreased bolus cohesion Oral - Thin Teaspoon -- Oral - Thin Cup Premature spillage;Decreased bolus cohesion Oral - Thin Straw Premature spillage;Decreased bolus cohesion Oral - Puree Reduced posterior propulsion Oral - Mech Soft Reduced posterior propulsion Oral - Regular Reduced posterior propulsion Oral - Multi-Consistency -- Oral - Pill -- Oral Phase - Comment --  CHL IP PHARYNGEAL PHASE 09/14/2020 Pharyngeal Phase Impaired Pharyngeal- Pudding Teaspoon -- Pharyngeal -- Pharyngeal- Pudding Cup -- Pharyngeal -- Pharyngeal- Honey Teaspoon -- Pharyngeal -- Pharyngeal- Honey Cup Pharyngeal  residue - valleculae;Pharyngeal residue - pyriform;Delayed swallow initiation-vallecula;Reduced tongue base retraction Pharyngeal -- Pharyngeal- Nectar Teaspoon -- Pharyngeal -- Pharyngeal- Nectar Cup Pharyngeal residue - valleculae;Pharyngeal residue - pyriform;Delayed swallow initiation-vallecula;Reduced tongue base retraction Pharyngeal Material does not enter airway Pharyngeal- Nectar Straw Pharyngeal residue - valleculae;Pharyngeal residue - pyriform;Delayed swallow initiation-vallecula;Reduced tongue base retraction Pharyngeal Material enters airway, remains ABOVE vocal cords and not ejected out Pharyngeal- Thin Teaspoon -- Pharyngeal -- Pharyngeal- Thin Cup Pharyngeal residue - valleculae;Pharyngeal residue - pyriform;Delayed swallow initiation-vallecula;Reduced tongue base retraction Pharyngeal Material enters airway, passes BELOW cords and not ejected out despite cough attempt by patient Pharyngeal- Thin Straw Pharyngeal residue - valleculae;Pharyngeal residue - pyriform;Delayed swallow initiation-vallecula;Reduced tongue base retraction Pharyngeal Material enters airway, passes BELOW cords and not ejected out despite cough attempt by patient;Material enters airway, remains ABOVE vocal cords and not ejected out Pharyngeal- Puree Pharyngeal residue - valleculae;Pharyngeal residue - pyriform;Delayed swallow initiation-vallecula;Reduced tongue base retraction Pharyngeal -- Pharyngeal- Mechanical Soft Pharyngeal residue - valleculae;Pharyngeal residue - pyriform;Delayed swallow initiation-vallecula;Reduced tongue base retraction Pharyngeal -- Pharyngeal- Regular Pharyngeal residue - valleculae;Pharyngeal residue - pyriform;Delayed swallow initiation-vallecula;Reduced tongue base retraction Pharyngeal -- Pharyngeal- Multi-consistency NT Pharyngeal -- Pharyngeal- Pill NT Pharyngeal -- Pharyngeal Comment --  CHL IP CERVICAL ESOPHAGEAL PHASE 09/14/2020 Cervical Esophageal Phase WFL Pudding Teaspoon -- Pudding Cup  -- Honey Teaspoon -- Honey Cup -- Nectar Teaspoon -- Nectar Cup -- Nectar Straw -- Thin Teaspoon -- Thin Cup -- Thin Straw -- Puree -- Mechanical Soft -- Regular -- Multi-consistency -- Pill -- Cervical Esophageal Comment -- Shanika I. Hardin Negus, [place], [place] Office number [phone] Pager [phone] Horton Marshall 09/14/2020, 4:14 PM              ECHOCARDIOGRAM COMPLETE  Result Date: 09/11/2020    ECHOCARDIOGRAM REPORT   Patient Name:   GRACESON HEBDA [facility] Date of Exam: 09/11/2020 Medical Rec #:  [id number]        Height:       68.0 in Accession #:    [id number]       Weight:       155.0 lb Date of Birth:  Jan 16, 1933       BSA:          1.834 m Patient Age:    57 years         BP:           135/71 mmHg Patient Gender: M                HR:           77 bpm. Exam Location:  Inpatient Procedure: 2D Echo, Cardiac Doppler and Color Doppler Indications:    I50.40* Unspecified combined systolic (congestive) and diastolic                 (  congestive) heart failure  History:        Patient has prior history of Echocardiogram examinations, most                 recent 01/02/2013. CHF, CAD, Abnormal ECG, COPD, Arrythmias:SVT,                 Signs/Symptoms:Shortness of Breath, Dyspnea, Altered Mental                 Status and Alzheimer's; Risk Factors:Dyslipidemia. Covid                 positive.  Sonographer:    Sheralyn Boatman RDCS Referring Phys: [id number] MICHAEL E NORINS  Sonographer Comments: Technically difficult study due to poor echo windows. Patient could not turn. Study interrupted to answer phone for endo. IMPRESSIONS  1. Left ventricular ejection fraction, by estimation, is 55 to 60%. The left ventricle has normal function. Abnormal sepal motion, no other regional wall motion abnormalities noted. There is mild concentric left ventricular hypertrophy. Left ventricular  diastolic parameters are indeterminate.  2. Right ventricular systolic function is normal. The right ventricular size is  normal. Mildly increased right ventricular wall thickness.  3. The mitral valve is grossly normal. No evidence of mitral valve regurgitation.  4. The aortic valve was not well visualized. There is mild calcification of the aortic valve. Aortic valve regurgitation is not visualized. No aortic stenosis is present.  5. Aortic dilatation noted. There is mild dilatation of the aortic root, measuring 40 mm.  6. The inferior vena cava is normal in size with greater than 50% respiratory variability, suggesting right atrial pressure of 3 mmHg. Comparison(s): A prior study was performed on 12/23/2012. Prior images reviewed side by side. Compared to prior, increase in aortic root size. FINDINGS  Left Ventricle: Left ventricular ejection fraction, by estimation, is 55 to 60%. The left ventricle has normal function. The left ventricle has no regional wall motion abnormalities. The left ventricular internal cavity size was normal in size. There is  mild concentric left ventricular hypertrophy. Left ventricular diastolic parameters are indeterminate. Right Ventricle: The right ventricular size is normal. Mildly increased right ventricular wall thickness. Right ventricular systolic function is normal. Left Atrium: Left atrial size was normal in size. Right Atrium: Right atrial size was normal in size. Pericardium: The pericardium was not well visualized. Mitral Valve: The mitral valve is grossly normal. No evidence of mitral valve regurgitation. Tricuspid Valve: The tricuspid valve is grossly normal. Tricuspid valve regurgitation is not demonstrated. Aortic Valve: The aortic valve was not well visualized. There is mild calcification of the aortic valve. Aortic valve regurgitation is not visualized. No aortic stenosis is present. Pulmonic Valve: The pulmonic valve was not well visualized. Pulmonic valve regurgitation is not visualized. Aorta: Aortic dilatation noted. There is mild dilatation of the aortic root, measuring 40 mm.  Venous: The inferior vena cava is normal in size with greater than 50% respiratory variability, suggesting right atrial pressure of 3 mmHg. IAS/Shunts: The atrial septum is grossly normal.  LEFT VENTRICLE PLAX 2D LVIDd:         4.05 cm     Diastology LVIDs:         2.63 cm     LV e' medial:    7.29 cm/s LV PW:         1.10 cm     LV E/e' medial:  9.1 LV IVS:        1.10 cm  LV e' lateral:   8.27 cm/s LVOT diam:     2.30 cm     LV E/e' lateral: 8.0 LV SV:         94 LV SV Index:   51 LVOT Area:     4.15 cm  LV Volumes (MOD) LV vol d, MOD A2C: 32.1 ml LV vol d, MOD A4C: 54.6 ml LV vol s, MOD A2C: 10.3 ml LV vol s, MOD A4C: 12.4 ml LV SV MOD A2C:     21.8 ml LV SV MOD A4C:     54.6 ml LV SV MOD BP:      32.1 ml RIGHT VENTRICLE             IVC RV S prime:     14.50 cm/s  IVC diam: 1.80 cm TAPSE (M-mode): 1.7 cm LEFT ATRIUM           Index      RIGHT ATRIUM          Index LA diam:      2.40 cm 1.31 cm/m RA Area:     9.59 cm LA Vol (A2C): 14.7 ml 8.02 ml/m RA Volume:   18.60 ml 10.14 ml/m LA Vol (A4C): 8.7 ml  4.72 ml/m  AORTIC VALVE LVOT Vmax:   116.00 cm/s LVOT Vmean:  70.400 cm/s LVOT VTI:    0.227 m  AORTA Ao Root diam: 4.00 cm MITRAL VALVE MV Area (PHT): 2.20 cm     SHUNTS MV Decel Time: 345 msec     Systemic VTI:  0.23 m MV E velocity: 66.20 cm/s   Systemic Diam: 2.30 cm MV A velocity: 114.00 cm/s MV E/A ratio:  0.58 Rudean Haskell MD Electronically signed by Rudean Haskell MD Signature Date/Time: 09/11/2020/1:12:15 PM    Final    VAS [place] LOWER EXTREMITY VENOUS (DVT)  Result Date: 09/13/2020  Lower Venous DVT Study Indications: Swelling.  Risk Factors: COVID 19 positive. Comparison Study: No prior studies. Performing Technologist: Oliver Hum RVT  Examination Guidelines: A complete evaluation includes B-mode imaging, spectral Doppler, color Doppler, and power Doppler as needed of all accessible portions of each vessel. Bilateral testing is considered an integral part of a complete  examination. Limited examinations for reoccurring indications may be performed as noted. The reflux portion of the exam is performed with the patient in reverse Trendelenburg.  +---------+---------------+---------+-----------+----------+--------------+ RIGHT    CompressibilityPhasicitySpontaneityPropertiesThrombus Aging +---------+---------------+---------+-----------+----------+--------------+ CFV      Full           Yes      Yes                                 +---------+---------------+---------+-----------+----------+--------------+ SFJ      Full                                                        +---------+---------------+---------+-----------+----------+--------------+ FV Prox  Full                                                        +---------+---------------+---------+-----------+----------+--------------+ FV Mid   Full                                                        +---------+---------------+---------+-----------+----------+--------------+  FV DistalFull                                                        +---------+---------------+---------+-----------+----------+--------------+ PFV      Full                                                        +---------+---------------+---------+-----------+----------+--------------+ POP      Full           Yes      Yes                                 +---------+---------------+---------+-----------+----------+--------------+ PTV      Full                                                        +---------+---------------+---------+-----------+----------+--------------+ PERO     Full                                                        +---------+---------------+---------+-----------+----------+--------------+   +---------+---------------+---------+-----------+----------+--------------+ LEFT     CompressibilityPhasicitySpontaneityPropertiesThrombus Aging  +---------+---------------+---------+-----------+----------+--------------+ CFV      Full           Yes      Yes                                 +---------+---------------+---------+-----------+----------+--------------+ SFJ      Full                                                        +---------+---------------+---------+-----------+----------+--------------+ FV Prox  Full                                                        +---------+---------------+---------+-----------+----------+--------------+ FV Mid   Full                                                        +---------+---------------+---------+-----------+----------+--------------+ FV DistalFull                                                        +---------+---------------+---------+-----------+----------+--------------+   PFV      Full                                                        +---------+---------------+---------+-----------+----------+--------------+ POP      Full           Yes      Yes                                 +---------+---------------+---------+-----------+----------+--------------+ PTV      Full                                                        +---------+---------------+---------+-----------+----------+--------------+ PERO     Full                                                        +---------+---------------+---------+-----------+----------+--------------+     Summary: RIGHT: - There is no evidence of deep vein thrombosis in the lower extremity.  - No cystic structure found in the popliteal fossa.  LEFT: - There is no evidence of deep vein thrombosis in the lower extremity.  - No cystic structure found in the popliteal fossa.  *See table(s) above for measurements and observations. Electronically signed by Heath Lark on 09/13/2020 at 5:38:10 PM.    Final

## 2020-09-15 NOTE — Plan of Care (Signed)

## 2020-09-16 DIAGNOSIS — U071 COVID-19: Secondary | ICD-10-CM | POA: Diagnosis not present

## 2020-09-16 DIAGNOSIS — I5032 Chronic diastolic (congestive) heart failure: Secondary | ICD-10-CM | POA: Diagnosis not present

## 2020-09-16 DIAGNOSIS — J431 Panlobular emphysema: Secondary | ICD-10-CM | POA: Diagnosis not present

## 2020-09-16 DIAGNOSIS — N179 Acute kidney failure, unspecified: Secondary | ICD-10-CM | POA: Diagnosis not present

## 2020-09-16 LAB — COMPREHENSIVE METABOLIC PANEL
ALT: 40 U/L (ref 0–44)
AST: 39 U/L (ref 15–41)
Albumin: 2.9 g/dL — ABNORMAL LOW (ref 3.5–5.0)
Alkaline Phosphatase: 88 U/L (ref 38–126)
Anion gap: 12 (ref 5–15)
BUN: 51 mg/dL — ABNORMAL HIGH (ref 8–23)
CO2: 17 mmol/L — ABNORMAL LOW (ref 22–32)
Calcium: 8.6 mg/dL — ABNORMAL LOW (ref 8.9–10.3)
Chloride: 112 mmol/L — ABNORMAL HIGH (ref 98–111)
Creatinine, Ser: 1.7 mg/dL — ABNORMAL HIGH (ref 0.61–1.24)
GFR, Estimated: 39 mL/min — ABNORMAL LOW (ref >60)
Glucose, Bld: 163 mg/dL — ABNORMAL HIGH (ref 70–99)
Potassium: 4.3 mmol/L (ref 3.5–5.1)
Sodium: 141 mmol/L (ref 135–145)
Total Bilirubin: 1.2 mg/dL (ref 0.3–1.2)
Total Protein: 6.7 g/dL (ref 6.5–8.1)

## 2020-09-16 LAB — CBC
HCT: 35.5 % — ABNORMAL LOW (ref 39.0–52.0)
Hemoglobin: 12 g/dL — ABNORMAL LOW (ref 13.0–17.0)
MCH: 31 pg (ref 26.0–34.0)
MCHC: 33.8 g/dL (ref 30.0–36.0)
MCV: 91.7 fL (ref 80.0–100.0)
Platelets: 147 10*3/uL — ABNORMAL LOW (ref 150–400)
RBC: 3.87 MIL/uL — ABNORMAL LOW (ref 4.22–5.81)
RDW: 14.7 % (ref 11.5–15.5)
WBC: 8.3 10*3/uL (ref 4.0–10.5)
nRBC: 0.2 % (ref 0.0–0.2)

## 2020-09-16 LAB — GLUCOSE, CAPILLARY
Glucose-Capillary: 119 mg/dL — ABNORMAL HIGH (ref 70–99)
Glucose-Capillary: 166 mg/dL — ABNORMAL HIGH (ref 70–99)
Glucose-Capillary: 140 mg/dL — ABNORMAL HIGH (ref 70–99)

## 2020-09-16 LAB — FERRITIN: Ferritin: 211 ng/mL (ref 24–336)

## 2020-09-16 LAB — D-DIMER, QUANTITATIVE: D-Dimer, Quant: 1.57 ug/mL-FEU — ABNORMAL HIGH (ref 0.00–0.50)

## 2020-09-16 LAB — LACTATE DEHYDROGENASE: LDH: 334 U/L — ABNORMAL HIGH (ref 98–192)

## 2020-09-16 LAB — MAGNESIUM: Magnesium: 2.1 mg/dL (ref 1.7–2.4)

## 2020-09-16 MED ORDER — LEVALBUTEROL TARTRATE 45 MCG/ACT IN AERO
6.0000 | INHALATION_SPRAY | Freq: Four times a day (QID) | RESPIRATORY_TRACT | Status: DC
  Administered 2020-09-17 – 2020-09-19 (×9): 6 via RESPIRATORY_TRACT
  Filled 2020-09-16: qty 15

## 2020-09-16 NOTE — Progress Notes (Signed)
PROGRESS NOTE                                                                                                                                                                                                             Patient Demographics:    Troy [name], is a 87 y.o. male, DOB - Apr 13, 1933, [id number]  Outpatient Primary MD for the patient is [name], MD   Admit date - 09/10/2020   LOS - 6  Chief Complaint  Patient presents with  . Shortness of Breath       Brief Narrative: Patient is a 87 y.o. male with PMHx of COPD, CAD s/p CABG, HTN, HLD, DM-2, HL, tobacco use-presented with fall/shortness of breath-found to have acute hypoxic respiratory failure due to COVID-19 pneumonia.  Hospital course complicated by development of A. fib with RVR and aspiration pneumonia provoking bronchospasm/COPD exacerbation.  COVID-19 vaccinated status: Unvaccinated  Significant Events: 12/26>> Admit to [facility] for hypoxia due to COVID-19 pneumonia 12/28>> A. fib with RVR  Significant studies: 12/26>> CT head: No acute abnormality 12/26>> CT C-spine: No acute abnormality 12/26>> CT chest: Emphysematous changes-biapical pleural thickening.  1 cm right lower lobe nodule, 7-8 mm left lower lobe nodule.  Mild dilatation of the ascending aortic aorta measuring 3.7 cm 12/27>> Echo: EF 55-60% 12/29>> chest x-ray: Ill-defined airspace opacity in the lower lung region superimposed on underlying fibrosis. 12/29>> lower extremity Doppler: No DVT  COVID-19 medications: Steroids: 12/26>> Remdesivir: 12/26>>12/30 Baricitinib: 12/28>>  Antibiotics: Unasyn: 12/30 x 5 days  Microbiology data: None  Procedures: None  Consults: None  DVT prophylaxis: apixaban (ELIQUIS) tablet 2.5 mg Start: 09/13/20 1100 apixaban (ELIQUIS) tablet 2.5 mg     Subjective:   Feels weak/debilitated and deconditioned but overall  improving-stable on 2 L of oxygen.  Per nursing staff-no major events overnight.  Assessment  & Plan :   Acute Hypoxic Resp Failure due to Covid 19 Viral pneumonia,aspiration pneumonia and COPD exacerbation: Had severe hypoxemia (was requiring around 10-11 L via HFNC)-has been gradually improving-continue bronchodilators/steroids/baricitinib and Unasyn (5-day course from 12/30).  Has dementia at baseline-suspect became debilitated/weak from COVID-19 infection-causing aspiration pneumonia which provoked COPD exacerbation.  SLP following-seems to be tolerating a dysphagia 2 diet.  Fever: afebrile O2 requirements:  SpO2: 93 % O2 Flow Rate (L/min): 2 L/min   COVID-19 Labs: Recent Labs  09/14/20 0049 09/15/20 0103 09/16/20 0337  DDIMER 1.87* 1.47* 1.57*  FERRITIN 268 239 211  LDH 322* 383* 334*  CRP 14.4* 9.2*  --        Component Value Date/Time   BNP 447.7 (H) 09/14/2020 0049    Recent Labs  Lab 09/11/20 0508 09/14/20 0049  PROCALCITON 4.51 1.73    Lab Results  Component Value Date   SARSCOV2NAA POSITIVE (A) 09/10/2020    Prone/Incentive Spirometry: encouraged incentive spirometry use 3-4/hour.  Atrial fibrillation with RVR: Remains in A. fib-rate better controlled-continue metoprolol-remains on Eliquis.  CHA2DS2-VASc of around 4.  COPD exacerbation: Wheezing has markedly improved this morning-provoked by aspiration which seems to have gotten better following initiation of a dysphagia 2 diet.  Continue bronchodilators-on tapering steroids.  Hypoxia clearly improving over the past few days.    Chronic diastolic heart failure: No signs of volume overload-with improving hypoxemia-stable volume status-continue to hold off on diuretics for now.  AKI on CKD stage IIIa: AKI hemodynamically mediated--creatinine slowly improving-but seems to have plateaued around 1.7-1.8 range (baseline of 1.2-1.4).  Volume status stable-continue to avoid nephrotoxic agents-and follow closely.     CAD-s/p CABG: No anginal symptoms-continue statin/beta-blocker-no longer on ASA as patient on anticoagulation  HTN: BP controlled--continue metoprolol  HLD: Continue statin  DM-2: CBGs controlled-continue Lantus 6 units daily and SSI.  Follow and adjust.  Recent Labs    09/15/20 1716 09/15/20 2103 09/16/20 0745  GLUCAP 176* 153* 119*    Chronic thrombocytopenia: Some mild worsening due to COVID-19-follow closely.  GERD: Continue PPI  Tobacco abuse: Continue transdermal nicotine  Dementia with mild delirium: Appears to be mild-continue supportive care-expect some amount of delirium during this hospital stay.  Remains on Aricept, Namenda, trazodone and Zoloft  Lung nodule: Seen incidentally on CT chest-stable for outpatient follow-up by PCP   Debility/deconditioning: Secondary to acute infection with COVID superimposed on dementia-previously living by himself with close supervision from son-needs SNF on discharge-have sent a epic message to social worker on 1/1.  Palliative care: DNR in place-worsening hypoxemia in the past few days-concern for aspiration-has significant pre-existing COPD/fibrosis of his lungs-hopefully he will slowly improve-however he is not a good candidate for aggressive medical care.  Spoke with patient's son [name] over the phone on 12/29-he is aware that if patient worsens significantly-we may need to pursue hospice care.  Per patient's son Don-patient has expressed his views very clearly-that if it was his "time"-he wanted to be made comfortable-and did not want any heroic measures.    ABG:    Component Value Date/Time   HCO3 15.8 (L) 09/10/2020 1354   TCO2 17 (L) 09/10/2020 1354   ACIDBASEDEF 12.0 (H) 09/10/2020 1354   O2SAT 51.0 09/10/2020 1354    Vent Settings: N/A    Condition - Extremely Guarded  Family Communication  :  Son [name] [phone]) updated over the phone on 12/31  Code Status :  DNR  Diet :  Diet Order            DIET DYS 2  Room service appropriate? Yes with Assist; Fluid consistency: Nectar Thick  Diet effective now                  Disposition Plan  :   Status is: Inpatient  Remains inpatient appropriate because:Inpatient level of care appropriate due to severity of illness   Dispo: The patient is from: Home              Anticipated d/c  is to: SNF              Anticipated d/c date is: > 3 days              Patient currently is not medically stable to d/c.    Barriers to discharge: Hypoxia requiring O2 supplementation/complete 5 days of IV Remdesivir  Antimicorbials  :    Anti-infectives (From admission, onward)   Start     Dose/Rate Route Frequency Ordered Stop   09/14/20 1200  Ampicillin-Sulbactam (UNASYN) 3 g in sodium chloride 0.9 % 100 mL IVPB        3 g 200 mL/hr over 30 Minutes Intravenous Every 12 hours 09/14/20 1053 09/19/20 1159   09/11/20 1000  remdesivir 100 mg in sodium chloride 0.9 % 100 mL IVPB       "Followed by" Linked Group Details   100 mg 200 mL/hr over 30 Minutes Intravenous Daily 09/10/20 1533 09/14/20 0947   09/10/20 1600  remdesivir 200 mg in sodium chloride 0.9% 250 mL IVPB       "Followed by" Linked Group Details   200 mg 580 mL/hr over 30 Minutes Intravenous Once 09/10/20 1533 09/10/20 1819      Inpatient Medications  Scheduled Meds: . apixaban  2.5 mg Oral BID  . vitamin C  500 mg Oral Daily  . baricitinib  2 mg Oral Daily  . donepezil  10 mg Oral QHS  . insulin aspart  0-20 Units Subcutaneous TID WC  . insulin glargine  6 Units Subcutaneous QHS  . levalbuterol  6 puff Inhalation Q6H  . memantine  10 mg Oral BID  . methylPREDNISolone (SOLU-MEDROL) injection  40 mg Intravenous Q24H  . metoprolol tartrate  25 mg Oral Q8H  . nicotine  14 mg Transdermal Daily  . pantoprazole  40 mg Oral BID  . senna  1 tablet Oral BID  . sertraline  50 mg Oral Daily  . simvastatin  40 mg Oral QHS  . umeclidinium bromide  1 puff Inhalation Daily  . vitamin B-12  500 mcg  Oral Daily  . zinc sulfate  220 mg Oral Daily   Continuous Infusions: . ampicillin-sulbactam (UNASYN) IV 3 g (09/16/20 1120)  . dextrose 5 % and 0.9% NaCl 10 mL/hr at 09/13/20 0725   PRN Meds:.acetaminophen, albuterol, guaiFENesin-dextromethorphan, haloperidol lactate, metoprolol tartrate, Resource ThickenUp Clear, traZODone   Time Spent in minutes  25   See all Orders from today for further details   Oren Binet M.D on 09/16/2020 at 11:37 AM  To page go to [email or web address] - use universal password  [facility] -  Office  [phone]    Objective:   Vitals:   09/15/20 2200 09/16/20 0401 09/16/20 0600 09/16/20 0738  BP: 112/71 124/82    Pulse: 98 91    Resp: 18 20    Temp:  (!) 97.5 F (36.4 C)    TempSrc:  Oral    SpO2: 97% 95%  93%  Weight:   69.9 kg   Height:        Wt Readings from Last 3 Encounters:  09/16/20 69.9 kg  04/10/20 71.7 kg  01/24/20 72.4 kg     Intake/Output Summary (Last 24 hours) at 09/16/2020 1137 Last data filed at 09/16/2020 1123 Gross per 24 hour  Intake 960 ml  Output 750 ml  Net 210 ml     Physical Exam Gen Exam:Alert awake-not in any distress HEENT:atraumatic, normocephalic Chest: B/L clear to auscultation anteriorly CVS:S1S2 regular  Abdomen:soft non tender, non distended Extremities:no edema Neurology: Non focal Skin: no rash   Data Review:    CBC Recent Labs  Lab 09/11/20 0509 09/12/20 0110 09/13/20 0103 09/14/20 0049 09/15/20 0103 09/16/20 0733  WBC 10.2 8.5 7.6 7.8 7.4 PENDING  HGB 11.9* 12.7* 12.1* 12.0* 11.6* 12.0*  HCT 34.7* 39.1 36.5* 36.7* 35.1* 35.5*  PLT 82* 90* 85* 107* 104* 147*  MCV 92.5 93.3 92.2 92.4 91.4 91.7  MCH 31.7 30.3 30.6 30.2 30.2 31.0  MCHC 34.3 32.5 33.2 32.7 33.0 33.8  RDW 14.5 14.4 14.4 14.6 14.6 14.7  LYMPHSABS 0.9 0.6* 0.7 0.8 0.6*  --   MONOABS 0.3 0.3 0.3 0.3 0.5  --   EOSABS 0.0 0.0 0.0 0.0 0.0  --   BASOSABS 0.0 0.0 0.0 0.0 0.0  --     Chemistries  Recent Labs  Lab  09/12/20 0110 09/13/20 0103 09/14/20 0049 09/15/20 0103 09/16/20 0337 09/16/20 0945  NA 142 140 141 145  --  141  K 4.8 4.9 4.8 5.1  --  4.3  CL 112* 114* 112* 116*  --  112*  CO2 20* 16* 17* 18*  --  17*  GLUCOSE 117* 113* 113* 138*  --  163*  BUN 61* 51* 60* 63*  --  51*  CREATININE 1.99* 1.57* 1.85* 1.88*  --  1.70*  CALCIUM 8.5* 8.4* 8.6* 8.7*  --  8.6*  MG 2.1 2.1 2.2 2.3 2.1  --   AST 106* 83* 61* 45*  --  39  ALT 43 42 40 40  --  40  ALKPHOS 85 69 67 81  --  88  BILITOT 0.7 0.5 0.6 0.8  --  1.2   ------------------------------------------------------------------------------------------------------------------ No results for input(s): CHOL, HDL, LDLCALC, TRIG, CHOLHDL, LDLDIRECT in the last 72 hours.  Lab Results  Component Value Date   HGBA1C 6.1 (H) 09/11/2020   ------------------------------------------------------------------------------------------------------------------ No results for input(s): TSH, T4TOTAL, T3FREE, THYROIDAB in the last 72 hours.  Invalid input(s): FREET3 ------------------------------------------------------------------------------------------------------------------ Recent Labs    09/15/20 0103 09/16/20 0337  FERRITIN 239 211    Coagulation profile No results for input(s): INR, PROTIME in the last 168 hours.  Recent Labs    09/15/20 0103 09/16/20 0337  DDIMER 1.47* 1.57*    Cardiac Enzymes No results for input(s): CKMB, TROPONINI, MYOGLOBIN in the last 168 hours.  Invalid input(s): CK ------------------------------------------------------------------------------------------------------------------    Component Value Date/Time   BNP 447.7 (H) 09/14/2020 0049    Micro Results Recent Results (from the past 240 hour(s))  Resp Panel by RT-PCR (Flu A&B, Covid) Nasopharyngeal Swab     Status: Abnormal   Collection Time: 09/10/20  1:27 PM   Specimen: Nasopharyngeal Swab; Nasopharyngeal(NP) swabs in vial transport medium  Result  Value Ref Range Status   SARS Coronavirus 2 by RT PCR POSITIVE (A) NEGATIVE Final    Comment: RESULT CALLED TO, READ BACK BY AND VERIFIED WITH: JOE B. [phone] (NOTE) SARS-CoV-2 target nucleic acids are DETECTED.  The SARS-CoV-2 RNA is generally detectable in upper respiratory specimens during the acute phase of infection. Positive results are indicative of the presence of the identified virus, but do not rule out bacterial infection or co-infection with other pathogens not detected by the test. Clinical correlation with patient history and other diagnostic information is necessary to determine patient infection status. The expected result is Negative.  Fact Sheet for Patients: BloggerCourse.com  Fact Sheet for Healthcare Providers: SeriousBroker.it  This test is not yet approved or  cleared by the [place] and  has been authorized for detection and/or diagnosis of SARS-CoV-2 by FDA under an Emergency Use Authorization (EUA).  This EUA will remain in effect (meaning this test can be used) for the  duration of  the COVID-19 declaration under Section 564(b)(1) of the Act, 21 U.S.C. section 360bbb-3(b)(1), unless the authorization is terminated or revoked sooner.     Influenza A by PCR NEGATIVE NEGATIVE Final   Influenza B by PCR NEGATIVE NEGATIVE Final    Comment: (NOTE) The Xpert Xpress SARS-CoV-2/FLU/RSV plus assay is intended as an aid in the diagnosis of influenza from Nasopharyngeal swab specimens and should not be used as a sole basis for treatment. Nasal washings and aspirates are unacceptable for Xpert Xpress SARS-CoV-2/FLU/RSV testing.  Fact Sheet for Patients: EntrepreneurPulse.com.au  Fact Sheet for Healthcare Providers: IncredibleEmployment.be  This test is not yet approved or cleared by the [place] FDA and has been authorized for detection and/or diagnosis of  SARS-CoV-2 by FDA under an Emergency Use Authorization (EUA). This EUA will remain in effect (meaning this test can be used) for the duration of the COVID-19 declaration under Section 564(b)(1) of the Act, 21 U.S.C. section 360bbb-3(b)(1), unless the authorization is terminated or revoked.  Performed at [facility], [place] [address] [place], [place] [address]     Radiology Reports DG Chest 1 View  Result Date: 09/10/2020 CLINICAL DATA:  Shortness of breath EXAM: CHEST  1 VIEW COMPARISON:  06/30/2018 FINDINGS: Cardiac shadow is within normal limits. Postsurgical changes are seen. The lungs are hyperinflated. No focal infiltrate or sizable effusion is seen. No bony abnormality is noted. IMPRESSION: COPD without acute abnormality. Electronically Signed   By: Inez Catalina [name]   On: 09/10/2020 13:45   CT Head Wo Contrast  Result Date: 09/10/2020 CLINICAL DATA:  Recent head trauma, initial encounter EXAM: CT HEAD WITHOUT CONTRAST TECHNIQUE: Contiguous axial images were obtained from the base of the skull through the vertex without intravenous contrast. COMPARISON:  None. FINDINGS: Brain: No evidence of acute infarction, hemorrhage, hydrocephalus, extra-axial collection or mass lesion/mass effect. Chronic atrophic and ischemic changes are noted. Vascular: No hyperdense vessel or unexpected calcification. Skull: Normal. Negative for fracture or focal lesion. Sinuses/Orbits: No acute finding. Other: None. IMPRESSION: Chronic atrophic and ischemic changes without acute abnormality. Electronically Signed   By: Inez Catalina [name]   On: 09/10/2020 14:28   CT CHEST WO CONTRAST  Result Date: 09/10/2020 CLINICAL DATA:  Respiratory failure.  COVID-19 positive.  Hypoxia. EXAM: CT CHEST WITHOUT CONTRAST TECHNIQUE: Multidetector CT imaging of the chest was performed following the standard protocol without IV contrast. COMPARISON:  None. FINDINGS: Cardiovascular: Heart is normal size. Patient is post  median sternotomy/CABG. Mild ectasia of the ascending thoracic aorta measuring 3.7 cm. There is calcified plaque throughout the thoracic aorta. Remaining vascular structures are unremarkable. Mediastinum/Nodes: No mediastinal or hilar adenopathy. Remaining mediastinal structures are normal. Lungs/Pleura: Lungs are hyperexpanded with centrilobular emphysematous disease. Biapical pleural thickening right worse than left. There is a 1 cm nodule opacity over the posteromedial right lower lobe with subtle adjacent possible hazy nodular opacification 7-8 mm nodule over the more superior aspect of the left lower lobe. Mild linear density with focal nodularity over the right middle lobe medially. No effusion. Airways are normal. Upper Abdomen: Calcified plaque over the abdominal aorta. No acute findings Musculoskeletal: Degenerative change of the spine. IMPRESSION: 1. Emphysematous disease with biapical pleural thickening right worse than left. 1 cm nodule  opacity over the posteromedial right lower lobe with subtle adjacent hazy nodular opacification. 7-8 mm nodule over the more superior aspect of the left lower lobe. Findings may be seen with atypical infection versus inflammatory process and less likely underlying neoplastic/metastatic disease. Recommend follow-up CT 6-8 weeks. 2. Emphysema and aortic atherosclerosis. Mild dilatation of the ascending thoracic aorta measuring 3.7 cm. Recommend annual imaging followup by CTA or MRA. This recommendation follows 2010 ACCF/AHA/AATS/ACR/ASA/SCA/SCAI/SIR/STS/SVM Guidelines for the Diagnosis and Management of Patients with Thoracic Aortic Disease. Circulation.2010; [id number]. Aortic aneurysm NOS (ICD10-I71.9). Aortic Atherosclerosis (ICD10-I70.0) and Emphysema (ICD10-J43.9). Electronically Signed   By: Marin Olp [name]   On: 09/10/2020 18:08   CT Cervical Spine Wo Contrast  Result Date: 09/10/2020 CLINICAL DATA:  Recent trauma with neck pain, initial encounter EXAM:  CT CERVICAL SPINE WITHOUT CONTRAST TECHNIQUE: Multidetector CT imaging of the cervical spine was performed without intravenous contrast. Multiplanar CT image reconstructions were also generated. COMPARISON:  None. FINDINGS: Alignment: Within normal limits. Skull base and vertebrae: 7 cervical segments are well visualized. Vertebral body height is well maintained. Multilevel osteophytic changes and facet hypertrophic changes are seen. No acute fracture or acute facet abnormality is noted. The odontoid is within normal limits. Soft tissues and spinal canal: Surrounding soft tissue structures are within normal limits with the exception of vascular calcifications. Upper chest: Pleural and parenchymal scarring is noted in the apices bilaterally. Other: None IMPRESSION: Multilevel degenerative change without acute abnormality. Electronically Signed   By: Inez Catalina [name]   On: 09/10/2020 14:32   DG Chest Port 1 View  Result Date: 09/13/2020 CLINICAL DATA:  Shortness of breath.  Reported COVID-19 positive EXAM: PORTABLE CHEST 1 VIEW COMPARISON:  Chest radiograph and chest CT September 10, 2020 FINDINGS: There are areas of underlying fibrotic change. There is ill-defined airspace opacity superimposed on apparent fibrotic change in the lower lobe regions. No consolidation. The heart size is normal. The pulmonary vascularity is stable and appears consistent with underlying emphysematous change. Patient is status post coronary artery bypass grafting. No adenopathy. There is aortic atherosclerosis. No appreciable bone lesions. IMPRESSION: The appearance is felt to be indicative of ill-defined airspace opacity in the lower lung regions superimposed on underlying fibrotic type change. Underlying emphysematous change noted with decreased vascularity in the upper lobes, stable. Stable cardiac silhouette. Status post coronary artery bypass grafting. Aortic Atherosclerosis (ICD10-I70.0) and Emphysema (ICD10-J43.9).  Electronically Signed   By: Lowella Grip III [name]   On: 09/13/2020 08:46   DG Swallowing Func-Speech Pathology  Result Date: 09/14/2020 Objective Swallowing Evaluation: Type of Study: MBS-Modified Barium Swallow Study  Patient Details Name: SHAUNTA LINDNER MRN: [id number] Date of Birth: [birth year] Today's Date: 09/14/2020 Time: SLP Start Time (ACUTE ONLY): [id number] -SLP Stop Time (ACUTE ONLY): 1409 SLP Time Calculation (min) (ACUTE ONLY): 14 min Past Medical History: Past Medical History: Diagnosis Date . Aorto-iliac atherosclerosis ([place]) 08/2015  by xray . Barrett's esophagus   EGD 2012, rpt 2014, longterm PPI . Chronic idiopathic constipation 07/17/2010 . Chronic obstructive pulmonary disease (COPD) ([facility])   spirometry with moderate COPD . Coronary atherosclerosis of artery bypass graft  . Diabetes mellitus type II ~2008 . Gastrointestinal ulcer 04/2011  with hematemesis s/p EGD by [facility] . GERD (gastroesophageal reflux disease)  . Hearing loss  . HLD (hyperlipidemia)  . HNP (herniated nucleus pulposus), lumbar 2013  with compression of L5 nerve root and foot drop, also with lumbar DDD s/p surgery . Hypertrophy of prostate without urinary obstruction and other lower  urinary tract symptoms (LUTS)  . Memory loss  . Paroxysmal SVT (supraventricular tachycardia) ([place])   a. 3/14 => converted in ED with adenosine to AFib => NSR;  b. Echo 4/14:  Mild LVH, mild FBSH, EF 55-60%, Gr 1 DD . Prostate nodule ~2009  Left-benign s/p eval Uro WNL ([place]) . Thrombocytopenia ([place])  . Tobacco use disorder  Past Surgical History: Past Surgical History: Procedure Laterality Date . COLONOSCOPY  03/2009  small hemorrhoids, 2 polyps (hyperplastic and adenomatous), rpt 5 yrs (Dr. Barron Schmid) - pt declined rpt scheduling . COLONOSCOPY  10/2014  2 hyperplastic polyp, hemmorhoids ([place]) . CORONARY ARTERY BYPASS GRAFT  1999  Dr. Roxy Manns . ESOPHAGOGASTRODUODENOSCOPY  05/16/2011  after hematemesis, no lesions found, consistent with barrett's  esophagus rpt 2 yrs . ESOPHAGOGASTRODUODENOSCOPY  06/2013  barrett's, small HH, recheck 2-3 yrs ([place]) . HEMORRHOID SURGERY   . LUMBAR LAMINECTOMY/DECOMPRESSION MICRODISCECTOMY  01/31/2012  Procedure: LUMBAR LAMINECTOMY/DECOMPRESSION MICRODISCECTOMY 1 LEVEL;  Surgeon: Winfield Cunas, MD;  Location: [facility];  Service: Neurosurgery;  Laterality: Left;  LEFT Lumbar Four-Five Diskectomy . NOSE SURGERY   . ROTATOR CUFF REPAIR  04/2010  right (but has bilat) Dr. Latanya Maudlin ([facility]) HPI: Pt is an 87 y.o. male with medical history significant of COPD, CAD status post CABG, HTN, HLD, dementia, DM who presented the emergency department with reported fall and shortness of breath. Pt found to have acute hypoxic respiratory failure due to COVID-19 pneumonia. CXR 12/29: Ill-defined airspace opacity in the lower lung region superimposed on underlying fibrosis. Per referring MD's note, pt appeared "to be accumulating some secretions and wheezing/gurgling at times." and SLP was consulted "to make sure no overt aspiration".  No data recorded Assessment / Plan / Recommendation CHL IP CLINICAL IMPRESSIONS 09/14/2020 Clinical Impression Pt presents with oropharyngeal dysphagia characterized by reduced bolus propulsion, reduced lingual retraction, and a a pharyngeal delay. He demonstrated base of tongue residue, vallecular residue, and pyriform sinus reidue which were reduced with secondary swallows. Greater than 50% of thin liquid boluses was frequently propelled to the pyriform sinuses prior to any attempt at airway closure. Penetration and aspiration (PAS 5, 7) were demonstrated with thin liquids and resulted in weak, ineffective coughing. Penetration (PAS 3) was noted with nectar thick liquids via straw, but no via cup. No functional benefit was noted with a chin tuck posture. Additional trials and compensatory strategy attempts were refused by the pt who stated the he was tired and would not participate further. A dysphagia  2 diet with nectar thick liquids via cup is recommended at this time. SLP will follow for dysphagia treatment. SLP Visit Diagnosis Dysphagia, unspecified (R13.10) Attention and concentration deficit following -- Frontal lobe and executive function deficit following -- Impact on safety and function Moderate aspiration risk   CHL IP TREATMENT RECOMMENDATION 09/14/2020 Treatment Recommendations Therapy as outlined in treatment plan below   Prognosis 09/14/2020 Prognosis for Safe Diet Advancement Good Barriers to Reach Goals Severity of deficits Barriers/Prognosis Comment -- CHL IP DIET RECOMMENDATION 09/14/2020 SLP Diet Recommendations Dysphagia 2 (Fine chop) solids;Nectar thick liquid Liquid Administration via Cup;No straw Medication Administration Whole meds with puree Compensations Slow rate;Small sips/bites Postural Changes Seated upright at 90 degrees;Remain semi-upright after after feeds/meals (Comment)   CHL IP OTHER RECOMMENDATIONS 09/14/2020 Recommended Consults -- Oral Care Recommendations Oral care BID Other Recommendations Order thickener from pharmacy   [facility] IP FOLLOW UP RECOMMENDATIONS 09/14/2020 Follow up Recommendations (No Data)   [facility] IP FREQUENCY AND DURATION 09/14/2020 Speech Therapy Frequency (  ACUTE ONLY) min 2x/week Treatment Duration 2 weeks      [facility] IP ORAL PHASE 09/14/2020 Oral Phase Impaired Oral - Pudding Teaspoon -- Oral - Pudding Cup -- Oral - Honey Teaspoon -- Oral - Honey Cup Reduced posterior propulsion Oral - Nectar Teaspoon Premature spillage;Decreased bolus cohesion Oral - Nectar Cup Premature spillage;Decreased bolus cohesion Oral - Nectar Straw Premature spillage;Decreased bolus cohesion Oral - Thin Teaspoon -- Oral - Thin Cup Premature spillage;Decreased bolus cohesion Oral - Thin Straw Premature spillage;Decreased bolus cohesion Oral - Puree Reduced posterior propulsion Oral - Mech Soft Reduced posterior propulsion Oral - Regular Reduced posterior propulsion Oral - Multi-Consistency  -- Oral - Pill -- Oral Phase - Comment --  [facility] IP PHARYNGEAL PHASE 09/14/2020 Pharyngeal Phase Impaired Pharyngeal- Pudding Teaspoon -- Pharyngeal -- Pharyngeal- Pudding Cup -- Pharyngeal -- Pharyngeal- Honey Teaspoon -- Pharyngeal -- Pharyngeal- Honey Cup Pharyngeal residue - valleculae;Pharyngeal residue - pyriform;Delayed swallow initiation-vallecula;Reduced tongue base retraction Pharyngeal -- Pharyngeal- Nectar Teaspoon -- Pharyngeal -- Pharyngeal- Nectar Cup Pharyngeal residue - valleculae;Pharyngeal residue - pyriform;Delayed swallow initiation-vallecula;Reduced tongue base retraction Pharyngeal Material does not enter airway Pharyngeal- Nectar Straw Pharyngeal residue - valleculae;Pharyngeal residue - pyriform;Delayed swallow initiation-vallecula;Reduced tongue base retraction Pharyngeal Material enters airway, remains ABOVE vocal cords and not ejected out Pharyngeal- Thin Teaspoon -- Pharyngeal -- Pharyngeal- Thin Cup Pharyngeal residue - valleculae;Pharyngeal residue - pyriform;Delayed swallow initiation-vallecula;Reduced tongue base retraction Pharyngeal Material enters airway, passes BELOW cords and not ejected out despite cough attempt by patient Pharyngeal- Thin Straw Pharyngeal residue - valleculae;Pharyngeal residue - pyriform;Delayed swallow initiation-vallecula;Reduced tongue base retraction Pharyngeal Material enters airway, passes BELOW cords and not ejected out despite cough attempt by patient;Material enters airway, remains ABOVE vocal cords and not ejected out Pharyngeal- Puree Pharyngeal residue - valleculae;Pharyngeal residue - pyriform;Delayed swallow initiation-vallecula;Reduced tongue base retraction Pharyngeal -- Pharyngeal- Mechanical Soft Pharyngeal residue - valleculae;Pharyngeal residue - pyriform;Delayed swallow initiation-vallecula;Reduced tongue base retraction Pharyngeal -- Pharyngeal- Regular Pharyngeal residue - valleculae;Pharyngeal residue - pyriform;Delayed swallow  initiation-vallecula;Reduced tongue base retraction Pharyngeal -- Pharyngeal- Multi-consistency NT Pharyngeal -- Pharyngeal- Pill NT Pharyngeal -- Pharyngeal Comment --  CH[facility] IP CERVICAL ESOPHAGEAL PHASE 09/14/2020 Cervical Esophageal Phase WFL Pudding Teaspoon -- Pudding Cup -- Honey Teaspoon -- Honey Cup -- Nectar Teaspoon -- Nectar Cup -- Nectar Straw -- Thin Teaspoon -- Thin Cup -- Thin Straw -- Puree -- Mechanical Soft -- Regular -- Multi-consistency -- Pill -- Cervical Esophageal Comment -- Shanika I. Hardin Negus, [place], [place] Office number [phone] Pager [place] 09/14/2020, 4:14 PM              ECHOCARDIOGRAM COMPLETE  Result Date: 09/11/2020    ECHOCARDIOGRAM REPORT   Patient Name:   EMRAH CORRIEA [facility] Date of Exam: 09/11/2020 Medical Rec #:  [id number]        Height:       68.0 in Accession #:    [id number]       Weight:       155.0 lb Date of Birth:  1933-01-30       BSA:          1.834 m Patient Age:    41 years         BP:           135/71 mmHg Patient Gender: M                HR:           77 bpm. Exam Location:  Inpatient  Procedure: 2D Echo, Cardiac Doppler and Color Doppler Indications:    I50.40* Unspecified combined systolic (congestive) and diastolic                 (congestive) heart failure  History:        Patient has prior history of Echocardiogram examinations, most                 recent 01/02/2013. CHF, CAD, Abnormal ECG, COPD, Arrythmias:SVT,                 Signs/Symptoms:Shortness of Breath, Dyspnea, Altered Mental                 Status and Alzheimer's; Risk Factors:Dyslipidemia. Covid                 positive.  Sonographer:    Sheralyn Boatmanina West RDCS Referring Phys: [id number] MICHAEL E NORINS  Sonographer Comments: Technically difficult study due to poor echo windows. Patient could not turn. Study interrupted to answer phone for endo. IMPRESSIONS  1. Left ventricular ejection fraction, by estimation, is 55 to 60%. The left ventricle has normal  function. Abnormal sepal motion, no other regional wall motion abnormalities noted. There is mild concentric left ventricular hypertrophy. Left ventricular  diastolic parameters are indeterminate.  2. Right ventricular systolic function is normal. The right ventricular size is normal. Mildly increased right ventricular wall thickness.  3. The mitral valve is grossly normal. No evidence of mitral valve regurgitation.  4. The aortic valve was not well visualized. There is mild calcification of the aortic valve. Aortic valve regurgitation is not visualized. No aortic stenosis is present.  5. Aortic dilatation noted. There is mild dilatation of the aortic root, measuring 40 mm.  6. The inferior vena cava is normal in size with greater than 50% respiratory variability, suggesting right atrial pressure of 3 mmHg. Comparison(s): A prior study was performed on 12/23/2012. Prior images reviewed side by side. Compared to prior, increase in aortic root size. FINDINGS  Left Ventricle: Left ventricular ejection fraction, by estimation, is 55 to 60%. The left ventricle has normal function. The left ventricle has no regional wall motion abnormalities. The left ventricular internal cavity size was normal in size. There is  mild concentric left ventricular hypertrophy. Left ventricular diastolic parameters are indeterminate. Right Ventricle: The right ventricular size is normal. Mildly increased right ventricular wall thickness. Right ventricular systolic function is normal. Left Atrium: Left atrial size was normal in size. Right Atrium: Right atrial size was normal in size. Pericardium: The pericardium was not well visualized. Mitral Valve: The mitral valve is grossly normal. No evidence of mitral valve regurgitation. Tricuspid Valve: The tricuspid valve is grossly normal. Tricuspid valve regurgitation is not demonstrated. Aortic Valve: The aortic valve was not well visualized. There is mild calcification of the aortic valve. Aortic  valve regurgitation is not visualized. No aortic stenosis is present. Pulmonic Valve: The pulmonic valve was not well visualized. Pulmonic valve regurgitation is not visualized. Aorta: Aortic dilatation noted. There is mild dilatation of the aortic root, measuring 40 mm. Venous: The inferior vena cava is normal in size with greater than 50% respiratory variability, suggesting right atrial pressure of 3 mmHg. IAS/Shunts: The atrial septum is grossly normal.  LEFT VENTRICLE PLAX 2D LVIDd:         4.05 cm     Diastology LVIDs:         2.63 cm     LV e' medial:    7.29  cm/s LV PW:         1.10 cm     LV E/e' medial:  9.1 LV IVS:        1.10 cm     LV e' lateral:   8.27 cm/s LVOT diam:     2.30 cm     LV E/e' lateral: 8.0 LV SV:         94 LV SV Index:   51 LVOT Area:     4.15 cm  LV Volumes (MOD) LV vol d, MOD A2C: 32.1 ml LV vol d, MOD A4C: 54.6 ml LV vol s, MOD A2C: 10.3 ml LV vol s, MOD A4C: 12.4 ml LV SV MOD A2C:     21.8 ml LV SV MOD A4C:     54.6 ml LV SV MOD BP:      32.1 ml RIGHT VENTRICLE             IVC RV S prime:     14.50 cm/s  IVC diam: 1.80 cm TAPSE (M-mode): 1.7 cm LEFT ATRIUM           Index      RIGHT ATRIUM          Index LA diam:      2.40 cm 1.31 cm/m RA Area:     9.59 cm LA Vol (A2C): 14.7 ml 8.02 ml/m RA Volume:   18.60 ml 10.14 ml/m LA Vol (A4C): 8.7 ml  4.72 ml/m  AORTIC VALVE LVOT Vmax:   116.00 cm/s LVOT Vmean:  70.400 cm/s LVOT VTI:    0.227 m  AORTA Ao Root diam: 4.00 cm MITRAL VALVE MV Area (PHT): 2.20 cm     SHUNTS MV Decel Time: 345 msec     Systemic VTI:  0.23 m MV E velocity: 66.20 cm/s   Systemic Diam: 2.30 cm MV A velocity: 114.00 cm/s MV E/A ratio:  0.58 Rudean Haskell MD Electronically signed by Rudean Haskell MD Signature Date/Time: 09/11/2020/1:12:15 PM    Final    VAS [place] LOWER EXTREMITY VENOUS (DVT)  Result Date: 09/13/2020  Lower Venous DVT Study Indications: Swelling.  Risk Factors: COVID 19 positive. Comparison Study: No prior studies. Performing  Technologist: Oliver Hum RVT  Examination Guidelines: A complete evaluation includes B-mode imaging, spectral Doppler, color Doppler, and power Doppler as needed of all accessible portions of each vessel. Bilateral testing is considered an integral part of a complete examination. Limited examinations for reoccurring indications may be performed as noted. The reflux portion of the exam is performed with the patient in reverse Trendelenburg.  +---------+---------------+---------+-----------+----------+--------------+ RIGHT    CompressibilityPhasicitySpontaneityPropertiesThrombus Aging +---------+---------------+---------+-----------+----------+--------------+ CFV      Full           Yes      Yes                                 +---------+---------------+---------+-----------+----------+--------------+ SFJ      Full                                                        +---------+---------------+---------+-----------+----------+--------------+ FV Prox  Full                                                        +---------+---------------+---------+-----------+----------+--------------+  FV Mid   Full                                                        +---------+---------------+---------+-----------+----------+--------------+ FV DistalFull                                                        +---------+---------------+---------+-----------+----------+--------------+ PFV      Full                                                        +---------+---------------+---------+-----------+----------+--------------+ POP      Full           Yes      Yes                                 +---------+---------------+---------+-----------+----------+--------------+ PTV      Full                                                        +---------+---------------+---------+-----------+----------+--------------+ PERO     Full                                                         +---------+---------------+---------+-----------+----------+--------------+   +---------+---------------+---------+-----------+----------+--------------+ LEFT     CompressibilityPhasicitySpontaneityPropertiesThrombus Aging +---------+---------------+---------+-----------+----------+--------------+ CFV      Full           Yes      Yes                                 +---------+---------------+---------+-----------+----------+--------------+ SFJ      Full                                                        +---------+---------------+---------+-----------+----------+--------------+ FV Prox  Full                                                        +---------+---------------+---------+-----------+----------+--------------+ FV Mid   Full                                                        +---------+---------------+---------+-----------+----------+--------------+  FV DistalFull                                                        +---------+---------------+---------+-----------+----------+--------------+ PFV      Full                                                        +---------+---------------+---------+-----------+----------+--------------+ POP      Full           Yes      Yes                                 +---------+---------------+---------+-----------+----------+--------------+ PTV      Full                                                        +---------+---------------+---------+-----------+----------+--------------+ PERO     Full                                                        +---------+---------------+---------+-----------+----------+--------------+     Summary: RIGHT: - There is no evidence of deep vein thrombosis in the lower extremity.  - No cystic structure found in the popliteal fossa.  LEFT: - There is no evidence of deep vein thrombosis in the lower extremity.  - No cystic structure found in  the popliteal fossa.  *See table(s) above for measurements and observations. Electronically signed by Jamelle Haring on 09/13/2020 at 5:38:10 PM.    Final

## 2020-09-16 NOTE — TOC Initial Note (Signed)
Transition of Care [facility]) - Initial/Assessment Note    Patient Details  Name: Troy [name] MRN: [id number] Date of Birth: Nov 03, 1932  [facility] of [facility]) CM/SW Contact:    Carley Hammed, LCSWA Phone Number: 09/16/2020, 3:06 PM  Clinical Narrative:                 CSW spoke with pt's son and explained SNF recommendation. Son, Roe Coombs, is agreeable and has no preference for facility, except that it be in [place]. He noted that pt lives alone, but has a large support system in place. The plan is for him to return home after rehab. FL2 was completed and information sent out. CSW will hold off on insurance for now as DC date may not be until Tuesday.  Expected Discharge Plan: Skilled Nursing Facility Barriers to Discharge: Continued Medical Work up,Insurance Authorization,SNF Pending bed offer   Patient Goals and CMS Choice Patient states their goals for this hospitalization and ongoing recovery are:: For pt to return home safely. CMS Medicare.gov Compare Post Acute Care list provided to:: Patient Represenative (must comment) Roe Coombs, son) Choice offered to / list presented to : Adult Children  Expected Discharge Plan and Services Expected Discharge Plan: Skilled Nursing Facility   Discharge Planning Services: CM Consult Post Acute Care Choice: Skilled Nursing Facility Living arrangements for the past 2 months: Single Family Home                                      Prior Living Arrangements/Services Living arrangements for the past 2 months: Single Family Home Lives with:: Self Patient language and need for interpreter reviewed:: Yes Do you feel safe going back to the place where you live?: Yes      Need for Family Participation in Patient Care: Yes (Comment) Care giver support system in place?: Yes (comment) Current home services: DME (RW, but does not use it) Criminal Activity/Legal Involvement Pertinent to Current Situation/Hospitalization: No - Comment as  needed  Activities of Daily Living Home Assistive Devices/Equipment: None ADL Screening (condition at time of admission) Patient's cognitive ability adequate to safely complete daily activities?: Yes Is the patient deaf or have difficulty hearing?: No Does the patient have difficulty seeing, even when wearing glasses/contacts?: No Does the patient have difficulty concentrating, remembering, or making decisions?: No Patient able to express need for assistance with ADLs?: Yes Does the patient have difficulty dressing or bathing?: Yes Independently performs ADLs?: Yes (appropriate for developmental age) Does the patient have difficulty walking or climbing stairs?: Yes Weakness of Legs: Both Weakness of Arms/Hands: Both  Permission Sought/Granted Permission sought to share information with : Family Supports Permission granted to share information with : Yes, Verbal Permission Granted  Share Information with NAME: Milbert Bixler     Permission granted to share info w Relationship: Son  Permission granted to share info w Contact Information: [phone]  Emotional Assessment Appearance:: Other (Comment Required (Unable to assess) Attitude/Demeanor/Rapport: Unable to Assess Affect (typically observed): Unable to Assess Orientation: : Oriented to Self,Oriented to Place Alcohol / Substance Use: Not Applicable Psych Involvement: No (comment)  Admission diagnosis:  Hypoxia [R09.02] Acute respiratory failure with hypoxemia (HCC) [J96.01] Patient Active Problem List   Diagnosis Date Noted  . Severe hypoxemia 09/10/2020  . Acute respiratory disease due to COVID-19 virus 09/10/2020  . Acute respiratory failure with hypoxemia (HCC) 09/10/2020  . AKI (acute kidney injury) (HCC) 09/10/2020  .  Chronic diastolic CHF (congestive heart failure) ([place]) 09/10/2020  . Cerebral vascular disease 04/10/2020  . Diabetic cataract of both eyes ([place]) 01/24/2020  . Vitamin B12 deficiency 08/03/2019  .  History of stroke 08/03/2019  . Pseudodementia 10/01/2018  . Sensorineural hearing loss (SNHL) of both ears 12/31/2017  . Candidal intertrigo 12/22/2017  . Left serous otitis media 07/04/2017  . Hypertension associated with diabetes ([place]) 05/22/2017  . Eustachian tube dysfunction, left 04/04/2017  . Dry skin dermatitis 12/02/2016  . Health maintenance examination 10/08/2016  . Advanced care planning/counseling discussion 10/08/2016  . Thyromegaly 10/08/2016  . Anemia 10/08/2016  . Aorto-iliac atherosclerosis ([place]) 08/17/2015  . Bleeding hemorrhoid 06/13/2015  . Dementia without behavioral disturbance ([place]) 06/13/2015  . Orthostatic dizziness 08/01/2014  . Paresthesia of foot, bilateral 08/01/2014  . Supraventricular tachycardia ([place]) 11/24/2012  . CAD (coronary artery disease) 11/24/2012  . COPD exacerbation ([place]) 11/10/2012  . Lumbosacral radiculopathy at L5 12/23/2011  . Thrombocytopenia ([place])   . Barrett's esophagus   . Skin lesion of back 09/16/2011  . PUD (peptic ulcer disease) 05/16/2011  . Medicare annual wellness visit, subsequent 04/30/2011  . COPD (chronic obstructive pulmonary disease) ([place]) 12/26/2010  . Diabetes mellitus type 2 with complications ([place]) [id number]  . Chronic idiopathic constipation 07/17/2010  . Hyperlipidemia associated with type 2 diabetes mellitus ([place]) 05/13/2009  . Tobacco use 05/13/2009   PCP:  [name], MD Pharmacy:   [id number] [place], [address] [address] Phone: [phone] Fax: [phone]  [place], [address], [address] [place], [place] [address] Phone: [phone] Fax: [place], [place], [place] [id number], [place] [address] Phone: [phone] Fax: [phone]     Social Determinants of Health (SDOH) Interventions     Readmission Risk Interventions No flowsheet data found.

## 2020-09-17 DIAGNOSIS — R0902 Hypoxemia: Secondary | ICD-10-CM | POA: Diagnosis not present

## 2020-09-17 LAB — CBC
HCT: 29.5 % — ABNORMAL LOW (ref 39.0–52.0)
Hemoglobin: 9.9 g/dL — ABNORMAL LOW (ref 13.0–17.0)
MCH: 30.1 pg (ref 26.0–34.0)
MCHC: 33.6 g/dL (ref 30.0–36.0)
MCV: 89.7 fL (ref 80.0–100.0)
Platelets: 142 10*3/uL — ABNORMAL LOW (ref 150–400)
RBC: 3.29 MIL/uL — ABNORMAL LOW (ref 4.22–5.81)
RDW: 14.2 % (ref 11.5–15.5)
WBC: 10.3 10*3/uL (ref 4.0–10.5)
nRBC: 0 % (ref 0.0–0.2)

## 2020-09-17 LAB — URINALYSIS, ROUTINE W REFLEX MICROSCOPIC
Bacteria, UA: NONE SEEN
Bilirubin Urine: NEGATIVE
Glucose, UA: 50 mg/dL — AB
Ketones, ur: NEGATIVE mg/dL
Leukocytes,Ua: NEGATIVE
Nitrite: NEGATIVE
Protein, ur: NEGATIVE mg/dL
RBC / HPF: >50 RBC/hpf — ABNORMAL HIGH (ref 0–5)
Specific Gravity, Urine: 1.02 (ref 1.005–1.030)
pH: 5 (ref 5.0–8.0)

## 2020-09-17 LAB — COMPREHENSIVE METABOLIC PANEL
ALT: 37 U/L (ref 0–44)
AST: 32 U/L (ref 15–41)
Albumin: 2.4 g/dL — ABNORMAL LOW (ref 3.5–5.0)
Alkaline Phosphatase: 80 U/L (ref 38–126)
Anion gap: 9 (ref 5–15)
BUN: 42 mg/dL — ABNORMAL HIGH (ref 8–23)
CO2: 17 mmol/L — ABNORMAL LOW (ref 22–32)
Calcium: 8.1 mg/dL — ABNORMAL LOW (ref 8.9–10.3)
Chloride: 111 mmol/L (ref 98–111)
Creatinine, Ser: 1.54 mg/dL — ABNORMAL HIGH (ref 0.61–1.24)
GFR, Estimated: 44 mL/min — ABNORMAL LOW (ref >60)
Glucose, Bld: 128 mg/dL — ABNORMAL HIGH (ref 70–99)
Potassium: 4.6 mmol/L (ref 3.5–5.1)
Sodium: 137 mmol/L (ref 135–145)
Total Bilirubin: 0.8 mg/dL (ref 0.3–1.2)
Total Protein: 5.6 g/dL — ABNORMAL LOW (ref 6.5–8.1)

## 2020-09-17 LAB — GLUCOSE, CAPILLARY
Glucose-Capillary: 136 mg/dL — ABNORMAL HIGH (ref 70–99)
Glucose-Capillary: 74 mg/dL (ref 70–99)
Glucose-Capillary: 93 mg/dL (ref 70–99)
Glucose-Capillary: 135 mg/dL — ABNORMAL HIGH (ref 70–99)
Glucose-Capillary: 141 mg/dL — ABNORMAL HIGH (ref 70–99)

## 2020-09-17 MED ORDER — DOCUSATE SODIUM 100 MG PO CAPS
200.0000 mg | ORAL_CAPSULE | Freq: Two times a day (BID) | ORAL | Status: DC
  Administered 2020-09-17 (×2): 200 mg via ORAL
  Filled 2020-09-17 (×4): qty 2

## 2020-09-17 MED ORDER — INSULIN GLARGINE 100 UNIT/ML ~~LOC~~ SOLN
3.0000 [IU] | Freq: Every day | SUBCUTANEOUS | Status: DC
  Administered 2020-09-17: 3 [IU] via SUBCUTANEOUS
  Filled 2020-09-17 (×2): qty 0.03

## 2020-09-17 MED ORDER — TAMSULOSIN HCL 0.4 MG PO CAPS
0.4000 mg | ORAL_CAPSULE | Freq: Every day | ORAL | Status: DC
  Administered 2020-09-17 – 2020-09-19 (×3): 0.4 mg via ORAL
  Filled 2020-09-17 (×3): qty 1

## 2020-09-17 MED ORDER — POLYETHYLENE GLYCOL 3350 17 G PO PACK
17.0000 g | PACK | Freq: Two times a day (BID) | ORAL | Status: DC
  Administered 2020-09-17: 17 g via ORAL
  Filled 2020-09-17 (×3): qty 1

## 2020-09-17 NOTE — Progress Notes (Addendum)
Patient complaining that he could not urinate, bladder scan for 468cc in and out cath for 500cc.  Patient stating that he feels like he is going to have a bowel movement and continues to try to get out of bed, after placing bed pan under patient several drops of bright blood noted in bedpan however pt is unable to have complete bowel movement.  On call provider notified, HgB down from 12 to 9.9.  0600 Patient had very large formed bowel movement that contained red streaks throughout

## 2020-09-17 NOTE — Progress Notes (Signed)
PROGRESS NOTE                                                                                                                                                                                                             Patient Demographics:    Troy [name], is a 87 y.o. male, DOB - 02/16/1933, [id number]  Outpatient Primary MD for the patient is [name], MD   Admit date - 09/10/2020   LOS - 7  Chief Complaint  Patient presents with  . Shortness of Breath       Brief Narrative: Patient is a 87 y.o. male with PMHx of COPD, CAD s/p CABG, HTN, HLD, DM-2, HL, tobacco use-presented with fall/shortness of breath-found to have acute hypoxic respiratory failure due to COVID-19 pneumonia.  Hospital course complicated by development of A. fib with RVR and aspiration pneumonia provoking bronchospasm/COPD exacerbation.  COVID-19 vaccinated status: Unvaccinated  Significant Events: 12/26>> Admit to [facility] for hypoxia due to COVID-19 pneumonia 12/28>> A. fib with RVR  Significant studies: 12/26>> CT head: No acute abnormality 12/26>> CT C-spine: No acute abnormality 12/26>> CT chest: Emphysematous changes-biapical pleural thickening.  1 cm right lower lobe nodule, 7-8 mm left lower lobe nodule.  Mild dilatation of the ascending aortic aorta measuring 3.7 cm 12/27>> Echo: EF 55-60% 12/29>> chest x-ray: Ill-defined airspace opacity in the lower lung region superimposed on underlying fibrosis. 12/29>> lower extremity Doppler: No DVT  COVID-19 medications: Steroids: 12/26>> Remdesivir: 12/26>>12/30 Baricitinib: 12/28>>  Antibiotics: Unasyn: 12/30 x 5 days  Microbiology data: None  Procedures: None  Consults: None  DVT prophylaxis: apixaban (ELIQUIS) tablet 2.5 mg Start: 09/13/20 1100 apixaban (ELIQUIS) tablet 2.5 mg     Subjective:   Patient in bed, appears comfortable, denies any headache, no fever,  no chest pain or pressure, no shortness of breath , no abdominal pain. No focal weakness.   Assessment  & Plan :   Acute Hypoxic Resp Failure due to Covid 19 Viral pneumonia,aspiration pneumonia and COPD exacerbation and aspiration pneumonia : Had severe hypoxemia (was requiring around 10-11 L via HFNC)-has been gradually improving-continue bronchodilators/steroids/baricitinib and Unasyn (5-day course from 12/30).  Has dementia at baseline-suspect became debilitated/weak from COVID-19 infection-causing aspiration pneumonia which provoked COPD exacerbation.  SLP following-seems to be tolerating a dysphagia 2 diet.  Fever: afebrile O2 requirements:  SpO2: 98 %  O2 Flow Rate (L/min): 2 L/min   COVID-19 Labs: Recent Labs    09/15/20 0103 09/16/20 0337  DDIMER 1.47* 1.57*  FERRITIN 239 211  LDH 383* 334*  CRP 9.2*  --        Component Value Date/Time   BNP 447.7 (H) 09/14/2020 0049    Recent Labs  Lab 09/11/20 0508 09/14/20 0049  PROCALCITON 4.51 1.73    Lab Results  Component Value Date   SARSCOV2NAA POSITIVE (A) 09/10/2020    Prone/Incentive Spirometry: encouraged incentive spirometry use 3-4/hour.  Atrial fibrillation with RVR: Remains in A. fib-rate better controlled-continue metoprolol-remains on Eliquis.  CHA2DS2-VASc of around 4.  COPD exacerbation: Wheezing has markedly improved this morning-provoked by aspiration which seems to have gotten better following initiation of a dysphagia 2 diet.  Continue bronchodilators-on tapering steroids.  Hypoxia clearly improving over the past few days.    Chronic diastolic heart failure: No signs of volume overload-with improving hypoxemia-stable volume status-continue to hold off on diuretics for now.  AKI on CKD stage IIIa: AKI hemodynamically mediated--creatinine slowly improving-but seems to have plateaued around 1.7-1.8 range (baseline of 1.2-1.4).  Volume status stable-continue to avoid nephrotoxic agents-and follow closely.     CAD-s/p CABG: No anginal symptoms-continue statin/beta-blocker-no longer on ASA as patient on anticoagulation  HTN: BP controlled--continue metoprolol  HLD: Continue statin  DM-2: CBGs controlled-continue Lantus 6 units daily and SSI.  Follow and adjust.  Recent Labs    09/16/20 1659 09/16/20 2132 09/17/20 0758  GLUCAP 140* 136* 74    Chronic thrombocytopenia: Some mild worsening due to COVID-19-follow closely.  GERD: Continue PPI  Tobacco abuse: Continue transdermal nicotine  Dementia with mild delirium: Appears to be mild-continue supportive care-expect some amount of delirium during this hospital stay.  Remains on Aricept, Namenda, trazodone and Zoloft  Lung nodule: Seen incidentally on CT chest-stable for outpatient follow-up by PCP   Debility/deconditioning: Secondary to acute infection with COVID superimposed on dementia-previously living by himself with close supervision from son-needs SNF on discharge-have sent a epic message to social worker on 1/1.  Palliative care: DNR in place-worsening hypoxemia in the past few days-concern for aspiration-has significant pre-existing COPD/fibrosis of his lungs-hopefully he will slowly improve-however he is not a good candidate for aggressive medical care.  Spoke with patient's son [name] over the phone on 12/29-he is aware that if patient worsens significantly-we may need to pursue hospice care.  Per patient's son Don-patient has expressed his views very clearly-that if it was his "time"-he wanted to be made comfortable-and did not want any heroic measures.      Condition - Extremely Guarded  Family Communication  :  Son [name] [phone]) updated over the phone on 09/15/20  Code Status :  DNR  Diet :  Diet Order            DIET - DYS 1 Room service appropriate? No; Fluid consistency: Nectar Thick  Diet effective now                  Disposition Plan  :   Status is: Inpatient  Remains inpatient appropriate  because:Inpatient level of care appropriate due to severity of illness   Dispo: The patient is from: Home              Anticipated d/c is to: SNF              Anticipated d/c date is: > 3 days  Patient currently is not medically stable to d/c.    Barriers to discharge: Hypoxia requiring O2 supplementation/complete 5 days of IV Remdesivir  Antimicorbials  :    Anti-infectives (From admission, onward)   Start     Dose/Rate Route Frequency Ordered Stop   09/14/20 1200  Ampicillin-Sulbactam (UNASYN) 3 g in sodium chloride 0.9 % 100 mL IVPB        3 g 200 mL/hr over 30 Minutes Intravenous Every 12 hours 09/14/20 1053 09/19/20 1159   09/11/20 1000  remdesivir 100 mg in sodium chloride 0.9 % 100 mL IVPB       "Followed by" Linked Group Details   100 mg 200 mL/hr over 30 Minutes Intravenous Daily 09/10/20 1533 09/14/20 0947   09/10/20 1600  remdesivir 200 mg in sodium chloride 0.9% 250 mL IVPB       "Followed by" Linked Group Details   200 mg 580 mL/hr over 30 Minutes Intravenous Once 09/10/20 1533 09/10/20 1819      Inpatient Medications  Scheduled Meds: . apixaban  2.5 mg Oral BID  . vitamin C  500 mg Oral Daily  . baricitinib  2 mg Oral Daily  . docusate sodium  200 mg Oral BID  . donepezil  10 mg Oral QHS  . insulin aspart  0-20 Units Subcutaneous TID WC  . insulin glargine  6 Units Subcutaneous QHS  . levalbuterol  6 puff Inhalation Q6H  . memantine  10 mg Oral BID  . methylPREDNISolone (SOLU-MEDROL) injection  40 mg Intravenous Q24H  . metoprolol tartrate  25 mg Oral Q8H  . nicotine  14 mg Transdermal Daily  . pantoprazole  40 mg Oral BID  . polyethylene glycol  17 g Oral BID  . senna  1 tablet Oral BID  . sertraline  50 mg Oral Daily  . simvastatin  40 mg Oral QHS  . tamsulosin  0.4 mg Oral Daily  . umeclidinium bromide  1 puff Inhalation Daily  . vitamin B-12  500 mcg Oral Daily  . zinc sulfate  220 mg Oral Daily   Continuous Infusions: .  ampicillin-sulbactam (UNASYN) IV Stopped (09/17/20 0142)  . dextrose 5 % and 0.9% NaCl 10 mL/hr at 09/13/20 0725   PRN Meds:.acetaminophen, albuterol, guaiFENesin-dextromethorphan, haloperidol lactate, metoprolol tartrate, Resource ThickenUp Clear, traZODone   Time Spent in minutes  25   See all Orders from today for further details   Lala Lund M.D on 09/17/2020 at 11:40 AM  To page go to [email or web address] - use universal password  [facility] -  Office  [phone]    Objective:   Vitals:   09/16/20 2338 09/17/20 0000 09/17/20 0200 09/17/20 0441  BP:  105/70 120/86 (!) 140/97  Pulse: 85 88 94 100  Resp: 20 18 19 20   Temp:    97.6 F (36.4 C)  TempSrc:    Oral  SpO2: 99% 100% 99% 98%  Weight:    69.3 kg  Height:        Wt Readings from Last 3 Encounters:  09/17/20 69.3 kg  04/10/20 71.7 kg  01/24/20 72.4 kg     Intake/Output Summary (Last 24 hours) at 09/17/2020 1140 Last data filed at 09/17/2020 0546 Gross per 24 hour  Intake 1017.81 ml  Output 800 ml  Net 217.81 ml     Physical Exam  Awake Alert, No new F.N deficits, Normal affect [place].AT,PERRAL Supple Neck,No JVD, No cervical lymphadenopathy appriciated.  Symmetrical Chest wall movement, Good air movement bilaterally, CTAB  RRR,No Gallops, Rubs or new Murmurs, No Parasternal Heave +ve B.Sounds, Abd Soft, No tenderness, No organomegaly appriciated, No rebound - guarding or rigidity. No Cyanosis, Clubbing or edema, No new Rash or bruise    Data Review:    CBC Recent Labs  Lab 09/11/20 0509 09/12/20 0110 09/13/20 0103 09/14/20 0049 09/15/20 0103 09/16/20 0733 09/17/20 0113  WBC 10.2 8.5 7.6 7.8 7.4 8.3 10.3  HGB 11.9* 12.7* 12.1* 12.0* 11.6* 12.0* 9.9*  HCT 34.7* 39.1 36.5* 36.7* 35.1* 35.5* 29.5*  PLT 82* 90* 85* 107* 104* 147* 142*  MCV 92.5 93.3 92.2 92.4 91.4 91.7 89.7  MCH 31.7 30.3 30.6 30.2 30.2 31.0 30.1  MCHC 34.3 32.5 33.2 32.7 33.0 33.8 33.6  RDW 14.5 14.4 14.4 14.6 14.6 14.7  14.2  LYMPHSABS 0.9 0.6* 0.7 0.8 0.6*  --   --   MONOABS 0.3 0.3 0.3 0.3 0.5  --   --   EOSABS 0.0 0.0 0.0 0.0 0.0  --   --   BASOSABS 0.0 0.0 0.0 0.0 0.0  --   --     Chemistries  Recent Labs  Lab 09/12/20 0110 09/13/20 0103 09/14/20 0049 09/15/20 0103 09/16/20 0337 09/16/20 0945 09/17/20 0113  NA 142 140 141 145  --  141 137  K 4.8 4.9 4.8 5.1  --  4.3 4.6  CL 112* 114* 112* 116*  --  112* 111  CO2 20* 16* 17* 18*  --  17* 17*  GLUCOSE 117* 113* 113* 138*  --  163* 128*  BUN 61* 51* 60* 63*  --  51* 42*  CREATININE 1.99* 1.57* 1.85* 1.88*  --  1.70* 1.54*  CALCIUM 8.5* 8.4* 8.6* 8.7*  --  8.6* 8.1*  MG 2.1 2.1 2.2 2.3 2.1  --   --   AST 106* 83* 61* 45*  --  39 32  ALT 43 42 40 40  --  40 37  ALKPHOS 85 69 67 81  --  88 80  BILITOT 0.7 0.5 0.6 0.8  --  1.2 0.8   ------------------------------------------------------------------------------------------------------------------ No results for input(s): CHOL, HDL, LDLCALC, TRIG, CHOLHDL, LDLDIRECT in the last 72 hours.  Lab Results  Component Value Date   HGBA1C 6.1 (H) 09/11/2020   ------------------------------------------------------------------------------------------------------------------ No results for input(s): TSH, T4TOTAL, T3FREE, THYROIDAB in the last 72 hours.  Invalid input(s): FREET3 ------------------------------------------------------------------------------------------------------------------ Recent Labs    09/15/20 0103 09/16/20 0337  FERRITIN 239 211    Coagulation profile No results for input(s): INR, PROTIME in the last 168 hours.  Recent Labs    09/15/20 0103 09/16/20 0337  DDIMER 1.47* 1.57*    Cardiac Enzymes No results for input(s): CKMB, TROPONINI, MYOGLOBIN in the last 168 hours.  Invalid input(s): CK ------------------------------------------------------------------------------------------------------------------    Component Value Date/Time   BNP 447.7 (H) 09/14/2020 0049     Micro Results Recent Results (from the past 240 hour(s))  Resp Panel by RT-PCR (Flu A&B, Covid) Nasopharyngeal Swab     Status: Abnormal   Collection Time: 09/10/20  1:27 PM   Specimen: Nasopharyngeal Swab; Nasopharyngeal(NP) swabs in vial transport medium  Result Value Ref Range Status   SARS Coronavirus 2 by RT PCR POSITIVE (A) NEGATIVE Final    Comment: RESULT CALLED TO, READ BACK BY AND VERIFIED WITH: JOE B. [id number] (NOTE) SARS-CoV-2 target nucleic acids are DETECTED.  The SARS-CoV-2 RNA is generally detectable in upper respiratory specimens during the acute phase of infection. Positive results are indicative of the presence of the  identified virus, but do not rule out bacterial infection or co-infection with other pathogens not detected by the test. Clinical correlation with patient history and other diagnostic information is necessary to determine patient infection status. The expected result is Negative.  Fact Sheet for Patients: BloggerCourse.com  Fact Sheet for Healthcare Providers: SeriousBroker.it  This test is not yet approved or cleared by the [place] FDA and  has been authorized for detection and/or diagnosis of SARS-CoV-2 by FDA under an Emergency Use Authorization (EUA).  This EUA will remain in effect (meaning this test can be used) for the  duration of  the COVID-19 declaration under Section 564(b)(1) of the Act, 21 U.S.C. section 360bbb-3(b)(1), unless the authorization is terminated or revoked sooner.     Influenza A by PCR NEGATIVE NEGATIVE Final   Influenza B by PCR NEGATIVE NEGATIVE Final    Comment: (NOTE) The Xpert Xpress SARS-CoV-2/FLU/RSV plus assay is intended as an aid in the diagnosis of influenza from Nasopharyngeal swab specimens and should not be used as a sole basis for treatment. Nasal washings and aspirates are unacceptable for Xpert Xpress SARS-CoV-2/FLU/RSV testing.  Fact  Sheet for Patients: BloggerCourse.com  Fact Sheet for Healthcare Providers: SeriousBroker.it  This test is not yet approved or cleared by the [place] FDA and has been authorized for detection and/or diagnosis of SARS-CoV-2 by FDA under an Emergency Use Authorization (EUA). This EUA will remain in effect (meaning this test can be used) for the duration of the COVID-19 declaration under Section 564(b)(1) of the Act, 21 U.S.C. section 360bbb-3(b)(1), unless the authorization is terminated or revoked.  Performed at [facility], [address] [place], [place] [address]     Radiology Reports DG Chest 1 View  Result Date: 09/10/2020 CLINICAL DATA:  Shortness of breath EXAM: CHEST  1 VIEW COMPARISON:  06/30/2018 FINDINGS: Cardiac shadow is within normal limits. Postsurgical changes are seen. The lungs are hyperinflated. No focal infiltrate or sizable effusion is seen. No bony abnormality is noted. IMPRESSION: COPD without acute abnormality. Electronically Signed   By: Alcide Clever [name]   On: 09/10/2020 13:45   CT Head Wo Contrast  Result Date: 09/10/2020 CLINICAL DATA:  Recent head trauma, initial encounter EXAM: CT HEAD WITHOUT CONTRAST TECHNIQUE: Contiguous axial images were obtained from the base of the skull through the vertex without intravenous contrast. COMPARISON:  None. FINDINGS: Brain: No evidence of acute infarction, hemorrhage, hydrocephalus, extra-axial collection or mass lesion/mass effect. Chronic atrophic and ischemic changes are noted. Vascular: No hyperdense vessel or unexpected calcification. Skull: Normal. Negative for fracture or focal lesion. Sinuses/Orbits: No acute finding. Other: None. IMPRESSION: Chronic atrophic and ischemic changes without acute abnormality. Electronically Signed   By: Alcide Clever [name]   On: 09/10/2020 14:28   CT CHEST WO CONTRAST  Result Date: 09/10/2020 CLINICAL DATA:  Respiratory  failure.  COVID-19 positive.  Hypoxia. EXAM: CT CHEST WITHOUT CONTRAST TECHNIQUE: Multidetector CT imaging of the chest was performed following the standard protocol without IV contrast. COMPARISON:  None. FINDINGS: Cardiovascular: Heart is normal size. Patient is post median sternotomy/CABG. Mild ectasia of the ascending thoracic aorta measuring 3.7 cm. There is calcified plaque throughout the thoracic aorta. Remaining vascular structures are unremarkable. Mediastinum/Nodes: No mediastinal or hilar adenopathy. Remaining mediastinal structures are normal. Lungs/Pleura: Lungs are hyperexpanded with centrilobular emphysematous disease. Biapical pleural thickening right worse than left. There is a 1 cm nodule opacity over the posteromedial right lower lobe with subtle adjacent possible hazy nodular opacification 7-8  mm nodule over the more superior aspect of the left lower lobe. Mild linear density with focal nodularity over the right middle lobe medially. No effusion. Airways are normal. Upper Abdomen: Calcified plaque over the abdominal aorta. No acute findings Musculoskeletal: Degenerative change of the spine. IMPRESSION: 1. Emphysematous disease with biapical pleural thickening right worse than left. 1 cm nodule opacity over the posteromedial right lower lobe with subtle adjacent hazy nodular opacification. 7-8 mm nodule over the more superior aspect of the left lower lobe. Findings may be seen with atypical infection versus inflammatory process and less likely underlying neoplastic/metastatic disease. Recommend follow-up CT 6-8 weeks. 2. Emphysema and aortic atherosclerosis. Mild dilatation of the ascending thoracic aorta measuring 3.7 cm. Recommend annual imaging followup by CTA or MRA. This recommendation follows 2010 ACCF/AHA/AATS/ACR/ASA/SCA/SCAI/SIR/STS/SVM Guidelines for the Diagnosis and Management of Patients with Thoracic Aortic Disease. Circulation.2010; [id number]. Aortic aneurysm NOS (ICD10-I71.9).  Aortic Atherosclerosis (ICD10-I70.0) and Emphysema (ICD10-J43.9). Electronically Signed   By: Marin Olp [name]   On: 09/10/2020 18:08   CT Cervical Spine Wo Contrast  Result Date: 09/10/2020 CLINICAL DATA:  Recent trauma with neck pain, initial encounter EXAM: CT CERVICAL SPINE WITHOUT CONTRAST TECHNIQUE: Multidetector CT imaging of the cervical spine was performed without intravenous contrast. Multiplanar CT image reconstructions were also generated. COMPARISON:  None. FINDINGS: Alignment: Within normal limits. Skull base and vertebrae: 7 cervical segments are well visualized. Vertebral body height is well maintained. Multilevel osteophytic changes and facet hypertrophic changes are seen. No acute fracture or acute facet abnormality is noted. The odontoid is within normal limits. Soft tissues and spinal canal: Surrounding soft tissue structures are within normal limits with the exception of vascular calcifications. Upper chest: Pleural and parenchymal scarring is noted in the apices bilaterally. Other: None IMPRESSION: Multilevel degenerative change without acute abnormality. Electronically Signed   By: Inez Catalina [name]   On: 09/10/2020 14:32   DG Chest Port 1 View  Result Date: 09/13/2020 CLINICAL DATA:  Shortness of breath.  Reported COVID-19 positive EXAM: PORTABLE CHEST 1 VIEW COMPARISON:  Chest radiograph and chest CT September 10, 2020 FINDINGS: There are areas of underlying fibrotic change. There is ill-defined airspace opacity superimposed on apparent fibrotic change in the lower lobe regions. No consolidation. The heart size is normal. The pulmonary vascularity is stable and appears consistent with underlying emphysematous change. Patient is status post coronary artery bypass grafting. No adenopathy. There is aortic atherosclerosis. No appreciable bone lesions. IMPRESSION: The appearance is felt to be indicative of ill-defined airspace opacity in the lower lung regions superimposed on  underlying fibrotic type change. Underlying emphysematous change noted with decreased vascularity in the upper lobes, stable. Stable cardiac silhouette. Status post coronary artery bypass grafting. Aortic Atherosclerosis (ICD10-I70.0) and Emphysema (ICD10-J43.9). Electronically Signed   By: Lowella Grip III [name]   On: 09/13/2020 08:46   DG Swallowing Func-Speech Pathology  Result Date: 09/14/2020 Objective Swallowing Evaluation: Type of Study: MBS-Modified Barium Swallow Study  Patient Details Name: GUSTABO BECH MRN: [id number] Date of Birth: 1933-04-09 Today's Date: 09/14/2020 Time: SLP Start Time (ACUTE ONLY): [id number] -SLP Stop Time (ACUTE ONLY): 1409 SLP Time Calculation (min) (ACUTE ONLY): 14 min Past Medical History: Past Medical History: Diagnosis Date . Aorto-iliac atherosclerosis ([place]) 08/2015  by xray . Barrett's esophagus   EGD 2012, rpt 2014, longterm PPI . Chronic idiopathic constipation 07/17/2010 . Chronic obstructive pulmonary disease (COPD) ([facility])   spirometry with moderate COPD . Coronary atherosclerosis of artery bypass graft  . Diabetes mellitus  type II ~2008 . Gastrointestinal ulcer 04/2011  with hematemesis s/p EGD by [facility] . GERD (gastroesophageal reflux disease)  . Hearing loss  . HLD (hyperlipidemia)  . HNP (herniated nucleus pulposus), lumbar 2013  with compression of L5 nerve root and foot drop, also with lumbar DDD s/p surgery . Hypertrophy of prostate without urinary obstruction and other lower urinary tract symptoms (LUTS)  . Memory loss  . Paroxysmal SVT (supraventricular tachycardia) ([place])   a. 3/14 => converted in ED with adenosine to AFib => NSR;  b. Echo 4/14:  Mild LVH, mild FBSH, EF 55-60%, Gr 1 DD . Prostate nodule ~2009  Left-benign s/p eval Uro WNL ([place]) . Thrombocytopenia ([place])  . Tobacco use disorder  Past Surgical History: Past Surgical History: Procedure Laterality Date . COLONOSCOPY  03/2009  small hemorrhoids, 2 polyps (hyperplastic and adenomatous), rpt 5 yrs  (Dr. Barron Schmid) - pt declined rpt scheduling . COLONOSCOPY  10/2014  2 hyperplastic polyp, hemmorhoids ([place]) . CORONARY ARTERY BYPASS GRAFT  1999  Dr. Roxy Manns . ESOPHAGOGASTRODUODENOSCOPY  05/16/2011  after hematemesis, no lesions found, consistent with barrett's esophagus rpt 2 yrs . ESOPHAGOGASTRODUODENOSCOPY  06/2013  barrett's, small HH, recheck 2-3 yrs ([place]) . HEMORRHOID SURGERY   . LUMBAR LAMINECTOMY/DECOMPRESSION MICRODISCECTOMY  01/31/2012  Procedure: LUMBAR LAMINECTOMY/DECOMPRESSION MICRODISCECTOMY 1 LEVEL;  Surgeon: Winfield Cunas, MD;  Location: [place] [facility];  Service: Neurosurgery;  Laterality: Left;  LEFT Lumbar Four-Five Diskectomy . NOSE SURGERY   . ROTATOR CUFF REPAIR  04/2010  right (but has bilat) Dr. Latanya Maudlin ([facility]) HPI: Pt is an 87 y.o. male with medical history significant of COPD, CAD status post CABG, HTN, HLD, dementia, DM who presented the emergency department with reported fall and shortness of breath. Pt found to have acute hypoxic respiratory failure due to COVID-19 pneumonia. CXR 12/29: Ill-defined airspace opacity in the lower lung region superimposed on underlying fibrosis. Per referring MD's note, pt appeared "to be accumulating some secretions and wheezing/gurgling at times." and SLP was consulted "to make sure no overt aspiration".  No data recorded Assessment / Plan / Recommendation CHL IP CLINICAL IMPRESSIONS 09/14/2020 Clinical Impression Pt presents with oropharyngeal dysphagia characterized by reduced bolus propulsion, reduced lingual retraction, and a a pharyngeal delay. He demonstrated base of tongue residue, vallecular residue, and pyriform sinus reidue which were reduced with secondary swallows. Greater than 50% of thin liquid boluses was frequently propelled to the pyriform sinuses prior to any attempt at airway closure. Penetration and aspiration (PAS 5, 7) were demonstrated with thin liquids and resulted in weak, ineffective coughing. Penetration (PAS 3) was  noted with nectar thick liquids via straw, but no via cup. No functional benefit was noted with a chin tuck posture. Additional trials and compensatory strategy attempts were refused by the pt who stated the he was tired and would not participate further. A dysphagia 2 diet with nectar thick liquids via cup is recommended at this time. SLP will follow for dysphagia treatment. SLP Visit Diagnosis Dysphagia, unspecified (R13.10) Attention and concentration deficit following -- Frontal lobe and executive function deficit following -- Impact on safety and function Moderate aspiration risk   CHL IP TREATMENT RECOMMENDATION 09/14/2020 Treatment Recommendations Therapy as outlined in treatment plan below   Prognosis 09/14/2020 Prognosis for Safe Diet Advancement Good Barriers to Reach Goals Severity of deficits Barriers/Prognosis Comment -- CHL IP DIET RECOMMENDATION 09/14/2020 SLP Diet Recommendations Dysphagia 2 (Fine chop) solids;Nectar thick liquid Liquid Administration via Cup;No straw Medication Administration Whole meds with puree Compensations  Slow rate;Small sips/bites Postural Changes Seated upright at 90 degrees;Remain semi-upright after after feeds/meals (Comment)   CHL IP OTHER RECOMMENDATIONS 09/14/2020 Recommended Consults -- Oral Care Recommendations Oral care BID Other Recommendations Order thickener from pharmacy   CHL IP FOLLOW UP RECOMMENDATIONS 09/14/2020 Follow up Recommendations (No Data)   CHL IP FREQUENCY AND DURATION 09/14/2020 Speech Therapy Frequency (ACUTE ONLY) min 2x/week Treatment Duration 2 weeks      CHL IP ORAL PHASE 09/14/2020 Oral Phase Impaired Oral - Pudding Teaspoon -- Oral - Pudding Cup -- Oral - Honey Teaspoon -- Oral - Honey Cup Reduced posterior propulsion Oral - Nectar Teaspoon Premature spillage;Decreased bolus cohesion Oral - Nectar Cup Premature spillage;Decreased bolus cohesion Oral - Nectar Straw Premature spillage;Decreased bolus cohesion Oral - Thin Teaspoon -- Oral -  Thin Cup Premature spillage;Decreased bolus cohesion Oral - Thin Straw Premature spillage;Decreased bolus cohesion Oral - Puree Reduced posterior propulsion Oral - Mech Soft Reduced posterior propulsion Oral - Regular Reduced posterior propulsion Oral - Multi-Consistency -- Oral - Pill -- Oral Phase - Comment --  CHL IP PHARYNGEAL PHASE 09/14/2020 Pharyngeal Phase Impaired Pharyngeal- Pudding Teaspoon -- Pharyngeal -- Pharyngeal- Pudding Cup -- Pharyngeal -- Pharyngeal- Honey Teaspoon -- Pharyngeal -- Pharyngeal- Honey Cup Pharyngeal residue - valleculae;Pharyngeal residue - pyriform;Delayed swallow initiation-vallecula;Reduced tongue base retraction Pharyngeal -- Pharyngeal- Nectar Teaspoon -- Pharyngeal -- Pharyngeal- Nectar Cup Pharyngeal residue - valleculae;Pharyngeal residue - pyriform;Delayed swallow initiation-vallecula;Reduced tongue base retraction Pharyngeal Material does not enter airway Pharyngeal- Nectar Straw Pharyngeal residue - valleculae;Pharyngeal residue - pyriform;Delayed swallow initiation-vallecula;Reduced tongue base retraction Pharyngeal Material enters airway, remains ABOVE vocal cords and not ejected out Pharyngeal- Thin Teaspoon -- Pharyngeal -- Pharyngeal- Thin Cup Pharyngeal residue - valleculae;Pharyngeal residue - pyriform;Delayed swallow initiation-vallecula;Reduced tongue base retraction Pharyngeal Material enters airway, passes BELOW cords and not ejected out despite cough attempt by patient Pharyngeal- Thin Straw Pharyngeal residue - valleculae;Pharyngeal residue - pyriform;Delayed swallow initiation-vallecula;Reduced tongue base retraction Pharyngeal Material enters airway, passes BELOW cords and not ejected out despite cough attempt by patient;Material enters airway, remains ABOVE vocal cords and not ejected out Pharyngeal- Puree Pharyngeal residue - valleculae;Pharyngeal residue - pyriform;Delayed swallow initiation-vallecula;Reduced tongue base retraction Pharyngeal --  Pharyngeal- Mechanical Soft Pharyngeal residue - valleculae;Pharyngeal residue - pyriform;Delayed swallow initiation-vallecula;Reduced tongue base retraction Pharyngeal -- Pharyngeal- Regular Pharyngeal residue - valleculae;Pharyngeal residue - pyriform;Delayed swallow initiation-vallecula;Reduced tongue base retraction Pharyngeal -- Pharyngeal- Multi-consistency NT Pharyngeal -- Pharyngeal- Pill NT Pharyngeal -- Pharyngeal Comment --  CHL IP CERVICAL ESOPHAGEAL PHASE 09/14/2020 Cervical Esophageal Phase WFL Pudding Teaspoon -- Pudding Cup -- Honey Teaspoon -- Honey Cup -- Nectar Teaspoon -- Nectar Cup -- Nectar Straw -- Thin Teaspoon -- Thin Cup -- Thin Straw -- Puree -- Mechanical Soft -- Regular -- Multi-consistency -- Pill -- Cervical Esophageal Comment -- Shanika I. Hardin Negus, [place], [place] Office number [phone] Pager [place] 09/14/2020, 4:14 PM              ECHOCARDIOGRAM COMPLETE  Result Date: 09/11/2020    ECHOCARDIOGRAM REPORT   Patient Name:   LEANDROS HELSLEY [facility] Date of Exam: 09/11/2020 Medical Rec #:  [id number]        Height:       68.0 in Accession #:    [id number]       Weight:       155.0 lb Date of Birth:  Dec 08, 1932       BSA:          1.834 m Patient Age:  87 years         BP:           135/71 mmHg Patient Gender: M                HR:           77 bpm. Exam Location:  Inpatient Procedure: 2D Echo, Cardiac Doppler and Color Doppler Indications:    I50.40* Unspecified combined systolic (congestive) and diastolic                 (congestive) heart failure  History:        Patient has prior history of Echocardiogram examinations, most                 recent 01/02/2013. CHF, CAD, Abnormal ECG, COPD, Arrythmias:SVT,                 Signs/Symptoms:Shortness of Breath, Dyspnea, Altered Mental                 Status and Alzheimer's; Risk Factors:Dyslipidemia. Covid                 positive.  Sonographer:    Roseanna Rainbow RDCS Referring Phys: Burns Comments: Technically difficult study due to poor echo windows. Patient could not turn. Study interrupted to answer phone for endo. IMPRESSIONS  1. Left ventricular ejection fraction, by estimation, is 55 to 60%. The left ventricle has normal function. Abnormal sepal motion, no other regional wall motion abnormalities noted. There is mild concentric left ventricular hypertrophy. Left ventricular  diastolic parameters are indeterminate.  2. Right ventricular systolic function is normal. The right ventricular size is normal. Mildly increased right ventricular wall thickness.  3. The mitral valve is grossly normal. No evidence of mitral valve regurgitation.  4. The aortic valve was not well visualized. There is mild calcification of the aortic valve. Aortic valve regurgitation is not visualized. No aortic stenosis is present.  5. Aortic dilatation noted. There is mild dilatation of the aortic root, measuring 40 mm.  6. The inferior vena cava is normal in size with greater than 50% respiratory variability, suggesting right atrial pressure of 3 mmHg. Comparison(s): A prior study was performed on 12/23/2012. Prior images reviewed side by side. Compared to prior, increase in aortic root size. FINDINGS  Left Ventricle: Left ventricular ejection fraction, by estimation, is 55 to 60%. The left ventricle has normal function. The left ventricle has no regional wall motion abnormalities. The left ventricular internal cavity size was normal in size. There is  mild concentric left ventricular hypertrophy. Left ventricular diastolic parameters are indeterminate. Right Ventricle: The right ventricular size is normal. Mildly increased right ventricular wall thickness. Right ventricular systolic function is normal. Left Atrium: Left atrial size was normal in size. Right Atrium: Right atrial size was normal in size. Pericardium: The pericardium was not well visualized. Mitral Valve: The mitral valve is  grossly normal. No evidence of mitral valve regurgitation. Tricuspid Valve: The tricuspid valve is grossly normal. Tricuspid valve regurgitation is not demonstrated. Aortic Valve: The aortic valve was not well visualized. There is mild calcification of the aortic valve. Aortic valve regurgitation is not visualized. No aortic stenosis is present. Pulmonic Valve: The pulmonic valve was not well visualized. Pulmonic valve regurgitation is not visualized. Aorta: Aortic dilatation noted. There is mild dilatation of the aortic root, measuring 40 mm. Venous: The inferior vena cava is normal in size with greater than 50% respiratory  variability, suggesting right atrial pressure of 3 mmHg. IAS/Shunts: The atrial septum is grossly normal.  LEFT VENTRICLE PLAX 2D LVIDd:         4.05 cm     Diastology LVIDs:         2.63 cm     LV e' medial:    7.29 cm/s LV PW:         1.10 cm     LV E/e' medial:  9.1 LV IVS:        1.10 cm     LV e' lateral:   8.27 cm/s LVOT diam:     2.30 cm     LV E/e' lateral: 8.0 LV SV:         94 LV SV Index:   51 LVOT Area:     4.15 cm  LV Volumes (MOD) LV vol d, MOD A2C: 32.1 ml LV vol d, MOD A4C: 54.6 ml LV vol s, MOD A2C: 10.3 ml LV vol s, MOD A4C: 12.4 ml LV SV MOD A2C:     21.8 ml LV SV MOD A4C:     54.6 ml LV SV MOD BP:      32.1 ml RIGHT VENTRICLE             IVC RV S prime:     14.50 cm/s  IVC diam: 1.80 cm TAPSE (M-mode): 1.7 cm LEFT ATRIUM           Index      RIGHT ATRIUM          Index LA diam:      2.40 cm 1.31 cm/m RA Area:     9.59 cm LA Vol (A2C): 14.7 ml 8.02 ml/m RA Volume:   18.60 ml 10.14 ml/m LA Vol (A4C): 8.7 ml  4.72 ml/m  AORTIC VALVE LVOT Vmax:   116.00 cm/s LVOT Vmean:  70.400 cm/s LVOT VTI:    0.227 m  AORTA Ao Root diam: 4.00 cm MITRAL VALVE MV Area (PHT): 2.20 cm     SHUNTS MV Decel Time: 345 msec     Systemic VTI:  0.23 m MV E velocity: 66.20 cm/s   Systemic Diam: 2.30 cm MV A velocity: 114.00 cm/s MV E/A ratio:  0.58 Rudean Haskell MD Electronically signed by  Rudean Haskell MD Signature Date/Time: 09/11/2020/1:12:15 PM    Final    VAS [place] LOWER EXTREMITY VENOUS (DVT)  Result Date: 09/13/2020  Lower Venous DVT Study Indications: Swelling.  Risk Factors: COVID 19 positive. Comparison Study: No prior studies. Performing Technologist: Oliver Hum RVT  Examination Guidelines: A complete evaluation includes B-mode imaging, spectral Doppler, color Doppler, and power Doppler as needed of all accessible portions of each vessel. Bilateral testing is considered an integral part of a complete examination. Limited examinations for reoccurring indications may be performed as noted. The reflux portion of the exam is performed with the patient in reverse Trendelenburg.  +---------+---------------+---------+-----------+----------+--------------+ RIGHT    CompressibilityPhasicitySpontaneityPropertiesThrombus Aging +---------+---------------+---------+-----------+----------+--------------+ CFV      Full           Yes      Yes                                 +---------+---------------+---------+-----------+----------+--------------+ SFJ      Full                                                        +---------+---------------+---------+-----------+----------+--------------+  FV Prox  Full                                                        +---------+---------------+---------+-----------+----------+--------------+ FV Mid   Full                                                        +---------+---------------+---------+-----------+----------+--------------+ FV DistalFull                                                        +---------+---------------+---------+-----------+----------+--------------+ PFV      Full                                                        +---------+---------------+---------+-----------+----------+--------------+ POP      Full           Yes      Yes                                  +---------+---------------+---------+-----------+----------+--------------+ PTV      Full                                                        +---------+---------------+---------+-----------+----------+--------------+ PERO     Full                                                        +---------+---------------+---------+-----------+----------+--------------+   +---------+---------------+---------+-----------+----------+--------------+ LEFT     CompressibilityPhasicitySpontaneityPropertiesThrombus Aging +---------+---------------+---------+-----------+----------+--------------+ CFV      Full           Yes      Yes                                 +---------+---------------+---------+-----------+----------+--------------+ SFJ      Full                                                        +---------+---------------+---------+-----------+----------+--------------+ FV Prox  Full                                                        +---------+---------------+---------+-----------+----------+--------------+  FV Mid   Full                                                        +---------+---------------+---------+-----------+----------+--------------+ FV DistalFull                                                        +---------+---------------+---------+-----------+----------+--------------+ PFV      Full                                                        +---------+---------------+---------+-----------+----------+--------------+ POP      Full           Yes      Yes                                 +---------+---------------+---------+-----------+----------+--------------+ PTV      Full                                                        +---------+---------------+---------+-----------+----------+--------------+ PERO     Full                                                         +---------+---------------+---------+-----------+----------+--------------+     Summary: RIGHT: - There is no evidence of deep vein thrombosis in the lower extremity.  - No cystic structure found in the popliteal fossa.  LEFT: - There is no evidence of deep vein thrombosis in the lower extremity.  - No cystic structure found in the popliteal fossa.  *See table(s) above for measurements and observations. Electronically signed by Jamelle Haring on 09/13/2020 at 5:38:10 PM.    Final

## 2020-09-17 NOTE — Plan of Care (Signed)

## 2020-09-18 DIAGNOSIS — R0902 Hypoxemia: Secondary | ICD-10-CM | POA: Diagnosis not present

## 2020-09-18 LAB — COMPREHENSIVE METABOLIC PANEL
ALT: 38 U/L (ref 0–44)
AST: 29 U/L (ref 15–41)
Albumin: 2.4 g/dL — ABNORMAL LOW (ref 3.5–5.0)
Alkaline Phosphatase: 94 U/L (ref 38–126)
Anion gap: 10 (ref 5–15)
BUN: 41 mg/dL — ABNORMAL HIGH (ref 8–23)
CO2: 20 mmol/L — ABNORMAL LOW (ref 22–32)
Calcium: 8.3 mg/dL — ABNORMAL LOW (ref 8.9–10.3)
Chloride: 110 mmol/L (ref 98–111)
Creatinine, Ser: 1.65 mg/dL — ABNORMAL HIGH (ref 0.61–1.24)
GFR, Estimated: 40 mL/min — ABNORMAL LOW (ref >60)
Glucose, Bld: 127 mg/dL — ABNORMAL HIGH (ref 70–99)
Potassium: 4.6 mmol/L (ref 3.5–5.1)
Sodium: 140 mmol/L (ref 135–145)
Total Bilirubin: 0.9 mg/dL (ref 0.3–1.2)
Total Protein: 5.8 g/dL — ABNORMAL LOW (ref 6.5–8.1)

## 2020-09-18 LAB — CBC
HCT: 33.6 % — ABNORMAL LOW (ref 39.0–52.0)
Hemoglobin: 11 g/dL — ABNORMAL LOW (ref 13.0–17.0)
MCH: 30.1 pg (ref 26.0–34.0)
MCHC: 32.7 g/dL (ref 30.0–36.0)
MCV: 92.1 fL (ref 80.0–100.0)
Platelets: 171 10*3/uL (ref 150–400)
RBC: 3.65 MIL/uL — ABNORMAL LOW (ref 4.22–5.81)
RDW: 14.2 % (ref 11.5–15.5)
WBC: 11.3 10*3/uL — ABNORMAL HIGH (ref 4.0–10.5)
nRBC: 0 % (ref 0.0–0.2)

## 2020-09-18 LAB — GLUCOSE, CAPILLARY
Glucose-Capillary: 102 mg/dL — ABNORMAL HIGH (ref 70–99)
Glucose-Capillary: 103 mg/dL — ABNORMAL HIGH (ref 70–99)
Glucose-Capillary: 118 mg/dL — ABNORMAL HIGH (ref 70–99)
Glucose-Capillary: 145 mg/dL — ABNORMAL HIGH (ref 70–99)

## 2020-09-18 MED ORDER — INSULIN ASPART 100 UNIT/ML ~~LOC~~ SOLN: 0.0000 [IU] | Freq: Three times a day (TID) | SUBCUTANEOUS | Status: DC

## 2020-09-18 MED ORDER — METHYLPREDNISOLONE SODIUM SUCC 40 MG IJ SOLR
20.0000 mg | INTRAMUSCULAR | Status: DC
  Administered 2020-09-19: 20 mg via INTRAVENOUS
  Filled 2020-09-18: qty 1

## 2020-09-18 NOTE — Progress Notes (Signed)
Physical Therapy Treatment Patient Details Name: Troy [name] MRN: [id number] DOB: [birth year] Today's Date: 09/18/2020    History of Present Illness Pt is an 87 y.o. male admitted 09/10/20 with fall and SOB. Found to have acute hypoxic respiratory failure secondary to COVID-19 PNA, AKI on CKD 3. Head CT with no acute abnormality. Course complicated by afib with RVR, delirium. PMH includes COPD, CAD s/p CABG, HTN, DM2, tobacco use, memory loss.   PT Comments    Pt slowly progressing with mobility. Able to tolerate prolonged seated EOB and short standing/ambulation attempts with RW; pt requiring consistent modA to maintain balance with standing activity. Pt remains limited by generalized weakness, decreased activity tolerance, impaired short-term memory, and decreased attention. Productive cough with activity. Continue to recommend SNF-level therapies to maximize functional mobility and independence.    Follow Up Recommendations  SNF;Supervision/Assistance - 24 hour     Equipment Recommendations  Rolling walker with 5" wheels;3in1 (PT);Wheelchair cushion (measurements PT);Wheelchair (measurements PT)    Recommendations for Other Services       Precautions / Restrictions Precautions Precautions: Fall;Other (comment) Precaution Comments: Urine incontinence, bilateral soft mitts Restrictions Weight Bearing Restrictions: No    Mobility  Bed Mobility Overal bed mobility: Needs Assistance Bed Mobility: Supine to Sit;Rolling Rolling: Supervision   Supine to sit: Min assist;HOB elevated Sit to supine: Supervision   General bed mobility comments: Repeated cues to attend to task, minA for trunk elevation from elevated HOB; return to supine with supervision for line management; pt rolling to reposition well with use of bed rail  Transfers Overall transfer level: Needs assistance Equipment used: Rolling walker (2 wheeled) Transfers: Sit to/from Stand Sit to Stand: Mod assist          General transfer comment: Reliant on momentum to power into standing with modA for trunk elevation and stabilizing RW; still with posterior lean, although improved ability to translate weight anteriorly noted; modA for eccentric control to sit  Ambulation/Gait Ambulation/Gait assistance: Mod assist Gait Distance (Feet): 4 Feet Assistive device: Rolling walker (2 wheeled)   Gait velocity: Decreased   General Gait Details: Sides step R/L at EOB with RW, repeated cues for sequencing, modA to maintain balance due to posterior lean and instability; pt declined further distance secondary to fatigue requiring return to sit   Stairs             Wheelchair Mobility    Modified Rankin (Stroke Patients Only)       Balance Overall balance assessment: Needs assistance Sitting-balance support: No upper extremity supported;Bilateral upper extremity supported Sitting balance-Leahy Scale: Fair     Standing balance support: Bilateral upper extremity supported;During functional activity Standing balance-Leahy Scale: Poor Standing balance comment: Reliant on UE support and external assist to maintain static standing; posterior lean improving but still present, pt with difficulty correcting                            Cognition Arousal/Alertness: Awake/alert Behavior During Therapy: WFL for tasks assessed/performed Overall Cognitive Status: No family/caregiver present to determine baseline cognitive functioning Area of Impairment: Orientation;Awareness;Attention;Problem solving;Memory;Following commands;Safety/judgement                 Orientation Level: Disoriented to;Situation;Time Current Attention Level: Sustained Memory: Decreased short-term memory;Decreased recall of precautions Following Commands: Follows one step commands consistently;Follows one step commands with increased time Safety/Judgement: Decreased awareness of safety;Decreased awareness of  deficits Awareness: Intellectual Problem Solving: Requires verbal cues;Requires tactile  cues General Comments: Per chart, h/o dementia. Significant STM deficits. Does not know what COVID is      Exercises      General Comments        Pertinent Vitals/Pain Pain Assessment: No/denies pain    Home Living                      Prior Function            PT Goals (current goals can now be found in the care plan section) Progress towards PT goals: Progressing toward goals (slowly)    Frequency    Min 2X/week      PT Plan Frequency needs to be updated    Co-evaluation              AM-PAC PT "6 Clicks" Mobility   Outcome Measure  Help needed turning from your back to your side while in a flat bed without using bedrails?: A Little Help needed moving from lying on your back to sitting on the side of a flat bed without using bedrails?: A Little Help needed moving to and from a bed to a chair (including a wheelchair)?: A Lot Help needed standing up from a chair using your arms (e.g., wheelchair or bedside chair)?: A Lot Help needed to walk in hospital room?: A Lot Help needed climbing 3-5 steps with a railing? : A Lot 6 Click Score: 14    End of Session Equipment Utilized During Treatment: Oxygen Activity Tolerance: Patient limited by fatigue Patient left: in bed;with call bell/phone within reach;with bed alarm set Nurse Communication: Mobility status (RN agrees can leave bilateral soft mitts off pt for now; NT alerted pt needs new condom cath) PT Visit Diagnosis: Unsteadiness on feet (R26.81);Muscle weakness (generalized) (M62.81)     Time: [id number] PT Time Calculation (min) (ACUTE ONLY): 24 min  Charges:  $Therapeutic Activity: 23-37 mins                     Mabeline Caras, PT, DPT Acute Rehabilitation Services  Pager [phone] Office [phone]  Derry Lory 09/18/2020, 5:09 PM

## 2020-09-18 NOTE — Progress Notes (Signed)
PROGRESS NOTE                                                                                                                                                                                                             Patient Demographics:    Troy [name], is a 87 y.o. male, DOB - 01-20-1933, [id number]  Outpatient Primary MD for the patient is [name], MD   Admit date - 09/10/2020   LOS - 8  Chief Complaint  Patient presents with  . Shortness of Breath       Brief Narrative: Patient is a 87 y.o. male with PMHx of COPD, CAD s/p CABG, HTN, HLD, DM-2, HL, tobacco use-presented with fall/shortness of breath-found to have acute hypoxic respiratory failure due to COVID-19 pneumonia.  Hospital course complicated by development of A. fib with RVR and aspiration pneumonia provoking bronchospasm/COPD exacerbation.  COVID-19 vaccinated status: Unvaccinated  Significant Events: 12/26>> Admit to [facility] for hypoxia due to COVID-19 pneumonia 12/28>> A. fib with RVR  Significant studies: 12/26>> CT head: No acute abnormality 12/26>> CT C-spine: No acute abnormality 12/26>> CT chest: Emphysematous changes-biapical pleural thickening.  1 cm right lower lobe nodule, 7-8 mm left lower lobe nodule.  Mild dilatation of the ascending aortic aorta measuring 3.7 cm 12/27>> Echo: EF 55-60% 12/29>> chest x-ray: Ill-defined airspace opacity in the lower lung region superimposed on underlying fibrosis. 12/29>> lower extremity Doppler: No DVT  COVID-19 medications: Steroids: 12/26>> Remdesivir: 12/26>>12/30 Baricitinib: 12/28>>  Antibiotics: Unasyn: 12/30 x 5 days  Microbiology data: None  Procedures: None  Consults: None  DVT prophylaxis: apixaban (ELIQUIS) tablet 2.5 mg Start: 09/13/20 1100 apixaban (ELIQUIS) tablet 2.5 mg     Subjective:   Patient in bed, appears comfortable, denies any headache, no fever,  no chest pain or pressure, no shortness of breath , no abdominal pain. No focal weakness.    Assessment  & Plan :   Acute Hypoxic Resp Failure due to Covid 19 Viral pneumonia,aspiration pneumonia and COPD exacerbation and aspiration pneumonia : Had severe hypoxemia (was requiring around 10-11 L via HFNC)-has been gradually improving-continue bronchodilators/steroids/baricitinib and Unasyn (5-day course from 12/30).  Has dementia at baseline-suspect became debilitated/weak from COVID-19 infection-causing aspiration pneumonia which provoked COPD exacerbation.  SLP following-seems to be tolerating a dysphagia 2 diet.   O2 requirements:  SpO2: 97 %  O2 Flow Rate (L/min): 2 L/min   Prone/Incentive Spirometry: encouraged incentive spirometry use 3-4/hour.   Recent Labs  Lab 09/12/20 0110 09/13/20 0103 09/14/20 0049 09/15/20 0103 09/16/20 [id number] 09/16/20 0733 09/16/20 0945 09/17/20 0113 09/18/20 0301  WBC 8.5 7.6 7.8 7.4  --  8.3  --  10.3 11.3*  HGB 12.7* 12.1* 12.0* 11.6*  --  12.0*  --  9.9* 11.0*  HCT 39.1 36.5* 36.7* 35.1*  --  35.5*  --  29.5* 33.6*  PLT 90* 85* 107* 104*  --  147*  --  142* 171  CRP 12.4* 18.9* 14.4* 9.2*  --   --   --   --   --   BNP  --   --  447.7*  --   --   --   --   --   --   DDIMER 3.27* 2.43* 1.87* 1.47* 1.57*  --   --   --   --   PROCALCITON  --   --  1.73  --   --   --   --   --   --   AST 106* 83* 61* 45*  --   --  39 32 29  ALT 43 42 40 40  --   --  40 37 38  ALKPHOS 85 69 67 81  --   --  88 80 94  BILITOT 0.7 0.5 0.6 0.8  --   --  1.2 0.8 0.9  ALBUMIN 3.4* 2.8* 2.7* 2.8*  --   --  2.9* 2.4* 2.4*     Atrial fibrillation with RVR: Remains in A. fib-rate better controlled-continue metoprolol-remains on Eliquis.  CHA2DS2-VASc of around 4.  COPD exacerbation: Wheezing has markedly improved this morning-provoked by aspiration which seems to have gotten better following initiation of a dysphagia 2 diet.  Continue bronchodilators-on tapering steroids.   Hypoxia clearly improving over the past few days.    Chronic diastolic heart failure: No signs of volume overload-with improving hypoxemia-stable volume status-continue to hold off on diuretics for now.  AKI on CKD stage IIIa: AKI hemodynamically mediated--creatinine slowly improving-but seems to have plateaued around 1.7-1.8 range (baseline of 1.2-1.4).  Volume status stable-continue to avoid nephrotoxic agents-and follow closely.    CAD-s/p CABG: No anginal symptoms-continue statin/beta-blocker-no longer on ASA as patient on anticoagulation  HTN: BP controlled--continue metoprolol  HLD: Continue statin  DM-2: Dose adjusted 09/18/2020 with tapering steroids.  Recent Labs    09/17/20 1605 09/17/20 2117 09/18/20 0753  GLUCAP 135* 141* 102*   Lab Results  Component Value Date   HGBA1C 6.1 (H) 09/11/2020    Chronic thrombocytopenia: Some mild worsening due to COVID-19-follow closely.  GERD: Continue PPI  Tobacco abuse: Continue transdermal nicotine  Dementia with mild delirium: Appears to be mild-continue supportive care-expect some amount of delirium during this hospital stay.  Remains on Aricept, Namenda, trazodone and Zoloft  Lung nodule: Seen incidentally on CT chest-stable for outpatient follow-up by PCP   Debility/deconditioning: Secondary to acute infection with COVID superimposed on dementia-previously living by himself with close supervision from son-needs SNF on discharge-have sent a epic message to social worker on 1/1.  Palliative care: DNR in place-worsening hypoxemia in the past few days-concern for aspiration-has significant pre-existing COPD/fibrosis of his lungs-hopefully he will slowly improve-however he is not a good candidate for aggressive medical care.  Spoke with patient's son [name] over the phone on 12/29-he is aware that if patient worsens significantly-we may need to pursue hospice care.  Per patient's son Don-patient has expressed his views very clearly-that  if it was his "time"-he wanted to be made comfortable-and did not want any heroic measures.      Condition - Extremely Guarded  Family Communication  :  Son [name] [phone]) updated over the phone on 09/15/20, 09/18/20  Code Status :  DNR  Diet :  Diet Order            DIET - DYS 1 Room service appropriate? No; Fluid consistency: Nectar Thick  Diet effective now                  Disposition Plan  :   Status is: Inpatient  Remains inpatient appropriate because:Inpatient level of care appropriate due to severity of illness   Dispo: The patient is from: Home              Anticipated d/c is to: SNF              Anticipated d/c date is: > 3 days              Patient currently is not medically stable to d/c.    Barriers to discharge: Hypoxia requiring O2 supplementation/complete 5 days of IV Remdesivir  Antimicorbials  :    Anti-infectives (From admission, onward)   Start     Dose/Rate Route Frequency Ordered Stop   09/14/20 1200  Ampicillin-Sulbactam (UNASYN) 3 g in sodium chloride 0.9 % 100 mL IVPB        3 g 200 mL/hr over 30 Minutes Intravenous Every 12 hours 09/14/20 1053 09/19/20 1159   09/11/20 1000  remdesivir 100 mg in sodium chloride 0.9 % 100 mL IVPB       "Followed by" Linked Group Details   100 mg 200 mL/hr over 30 Minutes Intravenous Daily 09/10/20 1533 09/14/20 0947   09/10/20 1600  remdesivir 200 mg in sodium chloride 0.9% 250 mL IVPB       "Followed by" Linked Group Details   200 mg 580 mL/hr over 30 Minutes Intravenous Once 09/10/20 1533 09/10/20 1819      Inpatient Medications  Scheduled Meds: . apixaban  2.5 mg Oral BID  . vitamin C  500 mg Oral Daily  . baricitinib  2 mg Oral Daily  . docusate sodium  200 mg Oral BID  . donepezil  10 mg Oral QHS  . insulin aspart  0-20 Units Subcutaneous TID WC  . insulin glargine  3 Units Subcutaneous QHS  . levalbuterol  6 puff Inhalation Q6H  . memantine  10 mg Oral BID  . methylPREDNISolone  (SOLU-MEDROL) injection  40 mg Intravenous Q24H  . metoprolol tartrate  25 mg Oral Q8H  . nicotine  14 mg Transdermal Daily  . pantoprazole  40 mg Oral BID  . polyethylene glycol  17 g Oral BID  . senna  1 tablet Oral BID  . sertraline  50 mg Oral Daily  . simvastatin  40 mg Oral QHS  . tamsulosin  0.4 mg Oral Daily  . umeclidinium bromide  1 puff Inhalation Daily  . vitamin B-12  500 mcg Oral Daily  . zinc sulfate  220 mg Oral Daily   Continuous Infusions: . ampicillin-sulbactam (UNASYN) IV Stopped (09/18/20 0130)  . dextrose 5 % and 0.9% NaCl 10 mL/hr at 09/13/20 0725   PRN Meds:.acetaminophen, albuterol, guaiFENesin-dextromethorphan, haloperidol lactate, metoprolol tartrate, Resource ThickenUp Clear, traZODone   Time Spent in minutes  25   See all Orders from  today for further details   Lala Lund M.D on 09/18/2020 at 11:49 AM  To page go to [email or web address] - use universal password  [facility] -  Office  [phone]    Objective:   Vitals:   09/17/20 1601 09/17/20 2113 09/18/20 0500 09/18/20 0510  BP: 121/75 (!) 134/96  133/71  Pulse: 83 90  79  Resp:  15  19  Temp:  98.1 F (36.7 C)  97.6 F (36.4 C)  TempSrc:  Axillary  Oral  SpO2: 97% 98%  97%  Weight:   69.3 kg   Height:        Wt Readings from Last 3 Encounters:  09/18/20 69.3 kg  04/10/20 71.7 kg  01/24/20 72.4 kg     Intake/Output Summary (Last 24 hours) at 09/18/2020 1149 Last data filed at 09/18/2020 0935 Gross per 24 hour  Intake 465.28 ml  Output 400 ml  Net 65.28 ml     Physical Exam  Awake Alert, No new F.N deficits, Normal affect [place].AT,PERRAL Supple Neck,No JVD, No cervical lymphadenopathy appriciated.  Symmetrical Chest wall movement, Good air movement bilaterally, CTAB RRR,No Gallops, Rubs or new Murmurs, No Parasternal Heave +ve B.Sounds, Abd Soft, No tenderness, No organomegaly appriciated, No rebound - guarding or rigidity. No Cyanosis, Clubbing or edema, No new Rash or  bruise    Data Review:    CBC Recent Labs  Lab 09/12/20 0110 09/13/20 0103 09/14/20 0049 09/15/20 0103 09/16/20 0733 09/17/20 0113 09/18/20 0301  WBC 8.5 7.6 7.8 7.4 8.3 10.3 11.3*  HGB 12.7* 12.1* 12.0* 11.6* 12.0* 9.9* 11.0*  HCT 39.1 36.5* 36.7* 35.1* 35.5* 29.5* 33.6*  PLT 90* 85* 107* 104* 147* 142* 171  MCV 93.3 92.2 92.4 91.4 91.7 89.7 92.1  MCH 30.3 30.6 30.2 30.2 31.0 30.1 30.1  MCHC 32.5 33.2 32.7 33.0 33.8 33.6 32.7  RDW 14.4 14.4 14.6 14.6 14.7 14.2 14.2  LYMPHSABS 0.6* 0.7 0.8 0.6*  --   --   --   MONOABS 0.3 0.3 0.3 0.5  --   --   --   EOSABS 0.0 0.0 0.0 0.0  --   --   --   BASOSABS 0.0 0.0 0.0 0.0  --   --   --     Chemistries  Recent Labs  Lab 09/12/20 0110 09/13/20 0103 09/14/20 0049 09/15/20 0103 09/16/20 0337 09/16/20 0945 09/17/20 0113 09/18/20 0301  NA 142 140 141 145  --  141 137 140  K 4.8 4.9 4.8 5.1  --  4.3 4.6 4.6  CL 112* 114* 112* 116*  --  112* 111 110  CO2 20* 16* 17* 18*  --  17* 17* 20*  GLUCOSE 117* 113* 113* 138*  --  163* 128* 127*  BUN 61* 51* 60* 63*  --  51* 42* 41*  CREATININE 1.99* 1.57* 1.85* 1.88*  --  1.70* 1.54* 1.65*  CALCIUM 8.5* 8.4* 8.6* 8.7*  --  8.6* 8.1* 8.3*  MG 2.1 2.1 2.2 2.3 2.1  --   --   --   AST 106* 83* 61* 45*  --  39 32 29  ALT 43 42 40 40  --  40 37 38  ALKPHOS 85 69 67 81  --  88 80 94  BILITOT 0.7 0.5 0.6 0.8  --  1.2 0.8 0.9   ------------------------------------------------------------------------------------------------------------------ No results for input(s): CHOL, HDL, LDLCALC, TRIG, CHOLHDL, LDLDIRECT in the last 72 hours.  Lab Results  Component Value Date   HGBA1C  6.1 (H) 09/11/2020   ------------------------------------------------------------------------------------------------------------------ No results for input(s): TSH, T4TOTAL, T3FREE, THYROIDAB in the last 72 hours.  Invalid input(s):  FREET3 ------------------------------------------------------------------------------------------------------------------ Recent Labs    09/16/20 0337  FERRITIN 211    Coagulation profile No results for input(s): INR, PROTIME in the last 168 hours.  Recent Labs    09/16/20 0337  DDIMER 1.57*    Cardiac Enzymes No results for input(s): CKMB, TROPONINI, MYOGLOBIN in the last 168 hours.  Invalid input(s): CK ------------------------------------------------------------------------------------------------------------------    Component Value Date/Time   BNP 447.7 (H) 09/14/2020 0049    Micro Results Recent Results (from the past 240 hour(s))  Resp Panel by RT-PCR (Flu A&B, Covid) Nasopharyngeal Swab     Status: Abnormal   Collection Time: 09/10/20  1:27 PM   Specimen: Nasopharyngeal Swab; Nasopharyngeal(NP) swabs in vial transport medium  Result Value Ref Range Status   SARS Coronavirus 2 by RT PCR POSITIVE (A) NEGATIVE Final    Comment: RESULT CALLED TO, READ BACK BY AND VERIFIED WITH: JOE B. [id number] (NOTE) SARS-CoV-2 target nucleic acids are DETECTED.  The SARS-CoV-2 RNA is generally detectable in upper respiratory specimens during the acute phase of infection. Positive results are indicative of the presence of the identified virus, but do not rule out bacterial infection or co-infection with other pathogens not detected by the test. Clinical correlation with patient history and other diagnostic information is necessary to determine patient infection status. The expected result is Negative.  Fact Sheet for Patients: EntrepreneurPulse.com.au  Fact Sheet for Healthcare Providers: IncredibleEmployment.be  This test is not yet approved or cleared by the [place] FDA and  has been authorized for detection and/or diagnosis of SARS-CoV-2 by FDA under an Emergency Use Authorization (EUA).  This EUA will remain in effect (meaning  this test can be used) for the  duration of  the COVID-19 declaration under Section 564(b)(1) of the Act, 21 U.S.C. section 360bbb-3(b)(1), unless the authorization is terminated or revoked sooner.     Influenza A by PCR NEGATIVE NEGATIVE Final   Influenza B by PCR NEGATIVE NEGATIVE Final    Comment: (NOTE) The Xpert Xpress SARS-CoV-2/FLU/RSV plus assay is intended as an aid in the diagnosis of influenza from Nasopharyngeal swab specimens and should not be used as a sole basis for treatment. Nasal washings and aspirates are unacceptable for Xpert Xpress SARS-CoV-2/FLU/RSV testing.  Fact Sheet for Patients: EntrepreneurPulse.com.au  Fact Sheet for Healthcare Providers: IncredibleEmployment.be  This test is not yet approved or cleared by the [place] FDA and has been authorized for detection and/or diagnosis of SARS-CoV-2 by FDA under an Emergency Use Authorization (EUA). This EUA will remain in effect (meaning this test can be used) for the duration of the COVID-19 declaration under Section 564(b)(1) of the Act, 21 U.S.C. section 360bbb-3(b)(1), unless the authorization is terminated or revoked.  Performed at [facility], [place] [address], [place], [place] [address]     Radiology Reports DG Chest 1 View  Result Date: 09/10/2020 CLINICAL DATA:  Shortness of breath EXAM: CHEST  1 VIEW COMPARISON:  06/30/2018 FINDINGS: Cardiac shadow is within normal limits. Postsurgical changes are seen. The lungs are hyperinflated. No focal infiltrate or sizable effusion is seen. No bony abnormality is noted. IMPRESSION: COPD without acute abnormality. Electronically Signed   By: Inez Catalina [name]   On: 09/10/2020 13:45   CT Head Wo Contrast  Result Date: 09/10/2020 CLINICAL DATA:  Recent head trauma, initial encounter EXAM: CT HEAD WITHOUT CONTRAST TECHNIQUE:  Contiguous axial images were obtained from the base of the skull through the vertex  without intravenous contrast. COMPARISON:  None. FINDINGS: Brain: No evidence of acute infarction, hemorrhage, hydrocephalus, extra-axial collection or mass lesion/mass effect. Chronic atrophic and ischemic changes are noted. Vascular: No hyperdense vessel or unexpected calcification. Skull: Normal. Negative for fracture or focal lesion. Sinuses/Orbits: No acute finding. Other: None. IMPRESSION: Chronic atrophic and ischemic changes without acute abnormality. Electronically Signed   By: Inez Catalina [name]   On: 09/10/2020 14:28   CT CHEST WO CONTRAST  Result Date: 09/10/2020 CLINICAL DATA:  Respiratory failure.  COVID-19 positive.  Hypoxia. EXAM: CT CHEST WITHOUT CONTRAST TECHNIQUE: Multidetector CT imaging of the chest was performed following the standard protocol without IV contrast. COMPARISON:  None. FINDINGS: Cardiovascular: Heart is normal size. Patient is post median sternotomy/CABG. Mild ectasia of the ascending thoracic aorta measuring 3.7 cm. There is calcified plaque throughout the thoracic aorta. Remaining vascular structures are unremarkable. Mediastinum/Nodes: No mediastinal or hilar adenopathy. Remaining mediastinal structures are normal. Lungs/Pleura: Lungs are hyperexpanded with centrilobular emphysematous disease. Biapical pleural thickening right worse than left. There is a 1 cm nodule opacity over the posteromedial right lower lobe with subtle adjacent possible hazy nodular opacification 7-8 mm nodule over the more superior aspect of the left lower lobe. Mild linear density with focal nodularity over the right middle lobe medially. No effusion. Airways are normal. Upper Abdomen: Calcified plaque over the abdominal aorta. No acute findings Musculoskeletal: Degenerative change of the spine. IMPRESSION: 1. Emphysematous disease with biapical pleural thickening right worse than left. 1 cm nodule opacity over the posteromedial right lower lobe with subtle adjacent hazy nodular opacification. 7-8  mm nodule over the more superior aspect of the left lower lobe. Findings may be seen with atypical infection versus inflammatory process and less likely underlying neoplastic/metastatic disease. Recommend follow-up CT 6-8 weeks. 2. Emphysema and aortic atherosclerosis. Mild dilatation of the ascending thoracic aorta measuring 3.7 cm. Recommend annual imaging followup by CTA or MRA. This recommendation follows 2010 ACCF/AHA/AATS/ACR/ASA/SCA/SCAI/SIR/STS/SVM Guidelines for the Diagnosis and Management of Patients with Thoracic Aortic Disease. Circulation.2010; [id number]. Aortic aneurysm NOS (ICD10-I71.9). Aortic Atherosclerosis (ICD10-I70.0) and Emphysema (ICD10-J43.9). Electronically Signed   By: Marin Olp [name]   On: 09/10/2020 18:08   CT Cervical Spine Wo Contrast  Result Date: 09/10/2020 CLINICAL DATA:  Recent trauma with neck pain, initial encounter EXAM: CT CERVICAL SPINE WITHOUT CONTRAST TECHNIQUE: Multidetector CT imaging of the cervical spine was performed without intravenous contrast. Multiplanar CT image reconstructions were also generated. COMPARISON:  None. FINDINGS: Alignment: Within normal limits. Skull base and vertebrae: 7 cervical segments are well visualized. Vertebral body height is well maintained. Multilevel osteophytic changes and facet hypertrophic changes are seen. No acute fracture or acute facet abnormality is noted. The odontoid is within normal limits. Soft tissues and spinal canal: Surrounding soft tissue structures are within normal limits with the exception of vascular calcifications. Upper chest: Pleural and parenchymal scarring is noted in the apices bilaterally. Other: None IMPRESSION: Multilevel degenerative change without acute abnormality. Electronically Signed   By: Inez Catalina [name]   On: 09/10/2020 14:32   DG Chest Port 1 View  Result Date: 09/13/2020 CLINICAL DATA:  Shortness of breath.  Reported COVID-19 positive EXAM: PORTABLE CHEST 1 VIEW COMPARISON:   Chest radiograph and chest CT September 10, 2020 FINDINGS: There are areas of underlying fibrotic change. There is ill-defined airspace opacity superimposed on apparent fibrotic change in the lower lobe regions. No consolidation. The  heart size is normal. The pulmonary vascularity is stable and appears consistent with underlying emphysematous change. Patient is status post coronary artery bypass grafting. No adenopathy. There is aortic atherosclerosis. No appreciable bone lesions. IMPRESSION: The appearance is felt to be indicative of ill-defined airspace opacity in the lower lung regions superimposed on underlying fibrotic type change. Underlying emphysematous change noted with decreased vascularity in the upper lobes, stable. Stable cardiac silhouette. Status post coronary artery bypass grafting. Aortic Atherosclerosis (ICD10-I70.0) and Emphysema (ICD10-J43.9). Electronically Signed   By: Lowella Grip III [name]   On: 09/13/2020 08:46   DG Swallowing Func-Speech Pathology  Result Date: 09/14/2020 Objective Swallowing Evaluation: Type of Study: MBS-Modified Barium Swallow Study  Patient Details Name: KAZDEN MULVENNA MRN: [id number] Date of Birth: [birth year] Today's Date: 09/14/2020 Time: SLP Start Time (ACUTE ONLY): [id number] -SLP Stop Time (ACUTE ONLY): 1409 SLP Time Calculation (min) (ACUTE ONLY): 14 min Past Medical History: Past Medical History: Diagnosis Date . Aorto-iliac atherosclerosis ([place]) 08/2015  by xray . Barrett's esophagus   EGD 2012, rpt 2014, longterm PPI . Chronic idiopathic constipation 07/17/2010 . Chronic obstructive pulmonary disease (COPD) ([facility])   spirometry with moderate COPD . Coronary atherosclerosis of artery bypass graft  . Diabetes mellitus type II ~2008 . Gastrointestinal ulcer 04/2011  with hematemesis s/p EGD by [facility] . GERD (gastroesophageal reflux disease)  . Hearing loss  . HLD (hyperlipidemia)  . HNP (herniated nucleus pulposus), lumbar 2013  with compression of L5 nerve root and  foot drop, also with lumbar DDD s/p surgery . Hypertrophy of prostate without urinary obstruction and other lower urinary tract symptoms (LUTS)  . Memory loss  . Paroxysmal SVT (supraventricular tachycardia) ([place])   a. 3/14 => converted in ED with adenosine to AFib => NSR;  b. Echo 4/14:  Mild LVH, mild FBSH, EF 55-60%, Gr 1 DD . Prostate nodule ~2009  Left-benign s/p eval Uro WNL ([place]) . Thrombocytopenia ([place])  . Tobacco use disorder  Past Surgical History: Past Surgical History: Procedure Laterality Date . COLONOSCOPY  03/2009  small hemorrhoids, 2 polyps (hyperplastic and adenomatous), rpt 5 yrs (Dr. Barron Schmid) - pt declined rpt scheduling . COLONOSCOPY  10/2014  2 hyperplastic polyp, hemmorhoids ([place]) . CORONARY ARTERY BYPASS GRAFT  1999  Dr. Roxy Manns . ESOPHAGOGASTRODUODENOSCOPY  05/16/2011  after hematemesis, no lesions found, consistent with barrett's esophagus rpt 2 yrs . ESOPHAGOGASTRODUODENOSCOPY  06/2013  barrett's, small HH, recheck 2-3 yrs ([place]) . HEMORRHOID SURGERY   . LUMBAR LAMINECTOMY/DECOMPRESSION MICRODISCECTOMY  01/31/2012  Procedure: LUMBAR LAMINECTOMY/DECOMPRESSION MICRODISCECTOMY 1 LEVEL;  Surgeon: Winfield Cunas, MD;  Location: [facility];  Service: Neurosurgery;  Laterality: Left;  LEFT Lumbar Four-Five Diskectomy . NOSE SURGERY   . ROTATOR CUFF REPAIR  04/2010  right (but has bilat) Dr. Latanya Maudlin ([facility]) HPI: Pt is an 87 y.o. male with medical history significant of COPD, CAD status post CABG, HTN, HLD, dementia, DM who presented the emergency department with reported fall and shortness of breath. Pt found to have acute hypoxic respiratory failure due to COVID-19 pneumonia. CXR 12/29: Ill-defined airspace opacity in the lower lung region superimposed on underlying fibrosis. Per referring MD's note, pt appeared "to be accumulating some secretions and wheezing/gurgling at times." and SLP was consulted "to make sure no overt aspiration".  No data recorded Assessment / Plan /  Recommendation CHL IP CLINICAL IMPRESSIONS 09/14/2020 Clinical Impression Pt presents with oropharyngeal dysphagia characterized by reduced bolus propulsion, reduced lingual retraction, and a a pharyngeal delay. He demonstrated base  of tongue residue, vallecular residue, and pyriform sinus reidue which were reduced with secondary swallows. Greater than 50% of thin liquid boluses was frequently propelled to the pyriform sinuses prior to any attempt at airway closure. Penetration and aspiration (PAS 5, 7) were demonstrated with thin liquids and resulted in weak, ineffective coughing. Penetration (PAS 3) was noted with nectar thick liquids via straw, but no via cup. No functional benefit was noted with a chin tuck posture. Additional trials and compensatory strategy attempts were refused by the pt who stated the he was tired and would not participate further. A dysphagia 2 diet with nectar thick liquids via cup is recommended at this time. SLP will follow for dysphagia treatment. SLP Visit Diagnosis Dysphagia, unspecified (R13.10) Attention and concentration deficit following -- Frontal lobe and executive function deficit following -- Impact on safety and function Moderate aspiration risk   [facility] IP TREATMENT RECOMMENDATION 09/14/2020 Treatment Recommendations Therapy as outlined in treatment plan below   Prognosis 09/14/2020 Prognosis for Safe Diet Advancement Good Barriers to Reach Goals Severity of deficits Barriers/Prognosis Comment -- [facility] IP DIET RECOMMENDATION 09/14/2020 SLP Diet Recommendations Dysphagia 2 (Fine chop) solids;Nectar thick liquid Liquid Administration via Cup;No straw Medication Administration Whole meds with puree Compensations Slow rate;Small sips/bites Postural Changes Seated upright at 90 degrees;Remain semi-upright after after feeds/meals (Comment)   [facility] IP OTHER RECOMMENDATIONS 09/14/2020 Recommended Consults -- Oral Care Recommendations Oral care BID Other Recommendations Order thickener from  pharmacy   [facility] IP FOLLOW UP RECOMMENDATIONS 09/14/2020 Follow up Recommendations (No Data)   [facility] IP FREQUENCY AND DURATION 09/14/2020 Speech Therapy Frequency (ACUTE ONLY) min 2x/week Treatment Duration 2 weeks      [facility] IP ORAL PHASE 09/14/2020 Oral Phase Impaired Oral - Pudding Teaspoon -- Oral - Pudding Cup -- Oral - Honey Teaspoon -- Oral - Honey Cup Reduced posterior propulsion Oral - Nectar Teaspoon Premature spillage;Decreased bolus cohesion Oral - Nectar Cup Premature spillage;Decreased bolus cohesion Oral - Nectar Straw Premature spillage;Decreased bolus cohesion Oral - Thin Teaspoon -- Oral - Thin Cup Premature spillage;Decreased bolus cohesion Oral - Thin Straw Premature spillage;Decreased bolus cohesion Oral - Puree Reduced posterior propulsion Oral - Mech Soft Reduced posterior propulsion Oral - Regular Reduced posterior propulsion Oral - Multi-Consistency -- Oral - Pill -- Oral Phase - Comment --  [facility] IP PHARYNGEAL PHASE 09/14/2020 Pharyngeal Phase Impaired Pharyngeal- Pudding Teaspoon -- Pharyngeal -- Pharyngeal- Pudding Cup -- Pharyngeal -- Pharyngeal- Honey Teaspoon -- Pharyngeal -- Pharyngeal- Honey Cup Pharyngeal residue - valleculae;Pharyngeal residue - pyriform;Delayed swallow initiation-vallecula;Reduced tongue base retraction Pharyngeal -- Pharyngeal- Nectar Teaspoon -- Pharyngeal -- Pharyngeal- Nectar Cup Pharyngeal residue - valleculae;Pharyngeal residue - pyriform;Delayed swallow initiation-vallecula;Reduced tongue base retraction Pharyngeal Material does not enter airway Pharyngeal- Nectar Straw Pharyngeal residue - valleculae;Pharyngeal residue - pyriform;Delayed swallow initiation-vallecula;Reduced tongue base retraction Pharyngeal Material enters airway, remains ABOVE vocal cords and not ejected out Pharyngeal- Thin Teaspoon -- Pharyngeal -- Pharyngeal- Thin Cup Pharyngeal residue - valleculae;Pharyngeal residue - pyriform;Delayed swallow initiation-vallecula;Reduced tongue base  retraction Pharyngeal Material enters airway, passes BELOW cords and not ejected out despite cough attempt by patient Pharyngeal- Thin Straw Pharyngeal residue - valleculae;Pharyngeal residue - pyriform;Delayed swallow initiation-vallecula;Reduced tongue base retraction Pharyngeal Material enters airway, passes BELOW cords and not ejected out despite cough attempt by patient;Material enters airway, remains ABOVE vocal cords and not ejected out Pharyngeal- Puree Pharyngeal residue - valleculae;Pharyngeal residue - pyriform;Delayed swallow initiation-vallecula;Reduced tongue base retraction Pharyngeal -- Pharyngeal- Mechanical Soft Pharyngeal residue - valleculae;Pharyngeal residue - pyriform;Delayed swallow initiation-vallecula;Reduced tongue  base retraction Pharyngeal -- Pharyngeal- Regular Pharyngeal residue - valleculae;Pharyngeal residue - pyriform;Delayed swallow initiation-vallecula;Reduced tongue base retraction Pharyngeal -- Pharyngeal- Multi-consistency NT Pharyngeal -- Pharyngeal- Pill NT Pharyngeal -- Pharyngeal Comment --  [facility] IP CERVICAL ESOPHAGEAL PHASE 09/14/2020 Cervical Esophageal Phase WFL Pudding Teaspoon -- Pudding Cup -- Honey Teaspoon -- Honey Cup -- Nectar Teaspoon -- Nectar Cup -- Nectar Straw -- Thin Teaspoon -- Thin Cup -- Thin Straw -- Puree -- Mechanical Soft -- Regular -- Multi-consistency -- Pill -- Cervical Esophageal Comment -- Shanika I. Hardin Negus, [place], [place] Office number [phone] Pager [phone] Horton Marshall 09/14/2020, 4:14 PM              ECHOCARDIOGRAM COMPLETE  Result Date: 09/11/2020    ECHOCARDIOGRAM REPORT   Patient Name:   KROSS NICASIO [facility] Date of Exam: 09/11/2020 Medical Rec #:  [id number]        Height:       68.0 in Accession #:    [id number]       Weight:       155.0 lb Date of Birth:  [birth year]       BSA:          1.834 m Patient Age:    34 years         BP:           135/71 mmHg Patient Gender: M                HR:            77 bpm. Exam Location:  Inpatient Procedure: 2D Echo, Cardiac Doppler and Color Doppler Indications:    I50.40* Unspecified combined systolic (congestive) and diastolic                 (congestive) heart failure  History:        Patient has prior history of Echocardiogram examinations, most                 recent 01/02/2013. CHF, CAD, Abnormal ECG, COPD, Arrythmias:SVT,                 Signs/Symptoms:Shortness of Breath, Dyspnea, Altered Mental                 Status and Alzheimer's; Risk Factors:Dyslipidemia. Covid                 positive.  Sonographer:    Roseanna Rainbow RDCS Referring Phys: [place] Comments: Technically difficult study due to poor echo windows. Patient could not turn. Study interrupted to answer phone for endo. IMPRESSIONS  1. Left ventricular ejection fraction, by estimation, is 55 to 60%. The left ventricle has normal function. Abnormal sepal motion, no other regional wall motion abnormalities noted. There is mild concentric left ventricular hypertrophy. Left ventricular  diastolic parameters are indeterminate.  2. Right ventricular systolic function is normal. The right ventricular size is normal. Mildly increased right ventricular wall thickness.  3. The mitral valve is grossly normal. No evidence of mitral valve regurgitation.  4. The aortic valve was not well visualized. There is mild calcification of the aortic valve. Aortic valve regurgitation is not visualized. No aortic stenosis is present.  5. Aortic dilatation noted. There is mild dilatation of the aortic root, measuring 40 mm.  6. The inferior vena cava is normal in size with greater than 50% respiratory variability, suggesting right atrial pressure of 3 mmHg. Comparison(s): A prior study was performed  on 12/23/2012. Prior images reviewed side by side. Compared to prior, increase in aortic root size. FINDINGS  Left Ventricle: Left ventricular ejection fraction, by estimation, is 55 to 60%. The left ventricle  has normal function. The left ventricle has no regional wall motion abnormalities. The left ventricular internal cavity size was normal in size. There is  mild concentric left ventricular hypertrophy. Left ventricular diastolic parameters are indeterminate. Right Ventricle: The right ventricular size is normal. Mildly increased right ventricular wall thickness. Right ventricular systolic function is normal. Left Atrium: Left atrial size was normal in size. Right Atrium: Right atrial size was normal in size. Pericardium: The pericardium was not well visualized. Mitral Valve: The mitral valve is grossly normal. No evidence of mitral valve regurgitation. Tricuspid Valve: The tricuspid valve is grossly normal. Tricuspid valve regurgitation is not demonstrated. Aortic Valve: The aortic valve was not well visualized. There is mild calcification of the aortic valve. Aortic valve regurgitation is not visualized. No aortic stenosis is present. Pulmonic Valve: The pulmonic valve was not well visualized. Pulmonic valve regurgitation is not visualized. Aorta: Aortic dilatation noted. There is mild dilatation of the aortic root, measuring 40 mm. Venous: The inferior vena cava is normal in size with greater than 50% respiratory variability, suggesting right atrial pressure of 3 mmHg. IAS/Shunts: The atrial septum is grossly normal.  LEFT VENTRICLE PLAX 2D LVIDd:         4.05 cm     Diastology LVIDs:         2.63 cm     LV e' medial:    7.29 cm/s LV PW:         1.10 cm     LV E/e' medial:  9.1 LV IVS:        1.10 cm     LV e' lateral:   8.27 cm/s LVOT diam:     2.30 cm     LV E/e' lateral: 8.0 LV SV:         94 LV SV Index:   51 LVOT Area:     4.15 cm  LV Volumes (MOD) LV vol d, MOD A2C: 32.1 ml LV vol d, MOD A4C: 54.6 ml LV vol s, MOD A2C: 10.3 ml LV vol s, MOD A4C: 12.4 ml LV SV MOD A2C:     21.8 ml LV SV MOD A4C:     54.6 ml LV SV MOD BP:      32.1 ml RIGHT VENTRICLE             IVC RV S prime:     14.50 cm/s  IVC diam: 1.80  cm TAPSE (M-mode): 1.7 cm LEFT ATRIUM           Index      RIGHT ATRIUM          Index LA diam:      2.40 cm 1.31 cm/m RA Area:     9.59 cm LA Vol (A2C): 14.7 ml 8.02 ml/m RA Volume:   18.60 ml 10.14 ml/m LA Vol (A4C): 8.7 ml  4.72 ml/m  AORTIC VALVE LVOT Vmax:   116.00 cm/s LVOT Vmean:  70.400 cm/s LVOT VTI:    0.227 m  AORTA Ao Root diam: 4.00 cm MITRAL VALVE MV Area (PHT): 2.20 cm     SHUNTS MV Decel Time: 345 msec     Systemic VTI:  0.23 m MV E velocity: 66.20 cm/s   Systemic Diam: 2.30 cm MV A velocity: 114.00 cm/s MV E/A ratio:  0.58 Riley Lam MD Electronically signed by Riley Lam MD Signature Date/Time: 09/11/2020/1:12:15 PM    Final    VAS [place] LOWER EXTREMITY VENOUS (DVT)  Result Date: 09/13/2020  Lower Venous DVT Study Indications: Swelling.  Risk Factors: COVID 19 positive. Comparison Study: No prior studies. Performing Technologist: Chanda Busing RVT  Examination Guidelines: A complete evaluation includes B-mode imaging, spectral Doppler, color Doppler, and power Doppler as needed of all accessible portions of each vessel. Bilateral testing is considered an integral part of a complete examination. Limited examinations for reoccurring indications may be performed as noted. The reflux portion of the exam is performed with the patient in reverse Trendelenburg.  +---------+---------------+---------+-----------+----------+--------------+ RIGHT    CompressibilityPhasicitySpontaneityPropertiesThrombus Aging +---------+---------------+---------+-----------+----------+--------------+ CFV      Full           Yes      Yes                                 +---------+---------------+---------+-----------+----------+--------------+ SFJ      Full                                                        +---------+---------------+---------+-----------+----------+--------------+ FV Prox  Full                                                         +---------+---------------+---------+-----------+----------+--------------+ FV Mid   Full                                                        +---------+---------------+---------+-----------+----------+--------------+ FV DistalFull                                                        +---------+---------------+---------+-----------+----------+--------------+ PFV      Full                                                        +---------+---------------+---------+-----------+----------+--------------+ POP      Full           Yes      Yes                                 +---------+---------------+---------+-----------+----------+--------------+ PTV      Full                                                        +---------+---------------+---------+-----------+----------+--------------+  PERO     Full                                                        +---------+---------------+---------+-----------+----------+--------------+   +---------+---------------+---------+-----------+----------+--------------+ LEFT     CompressibilityPhasicitySpontaneityPropertiesThrombus Aging +---------+---------------+---------+-----------+----------+--------------+ CFV      Full           Yes      Yes                                 +---------+---------------+---------+-----------+----------+--------------+ SFJ      Full                                                        +---------+---------------+---------+-----------+----------+--------------+ FV Prox  Full                                                        +---------+---------------+---------+-----------+----------+--------------+ FV Mid   Full                                                        +---------+---------------+---------+-----------+----------+--------------+ FV DistalFull                                                         +---------+---------------+---------+-----------+----------+--------------+ PFV      Full                                                        +---------+---------------+---------+-----------+----------+--------------+ POP      Full           Yes      Yes                                 +---------+---------------+---------+-----------+----------+--------------+ PTV      Full                                                        +---------+---------------+---------+-----------+----------+--------------+ PERO     Full                                                        +---------+---------------+---------+-----------+----------+--------------+  Summary: RIGHT: - There is no evidence of deep vein thrombosis in the lower extremity.  - No cystic structure found in the popliteal fossa.  LEFT: - There is no evidence of deep vein thrombosis in the lower extremity.  - No cystic structure found in the popliteal fossa.  *See table(s) above for measurements and observations. Electronically signed by Jamelle Haring on 09/13/2020 at 5:38:10 PM.    Final

## 2020-09-18 NOTE — Progress Notes (Signed)
  Speech Language Pathology Treatment: Dysphagia  Patient Details Name: Troy [name] MRN: [id number] DOB: 1933/06/12 Today's Date: 09/18/2020 Time: 9833-8250 SLP Time Calculation (min) (ACUTE ONLY): 9 min  Assessment / Plan / Recommendation Clinical Impression  Pt was seen for dysphagia treatment. Pt's NT denied any signs of aspiration with p.o. intake, but stated that he did not want to eat much at lunch. Pt refused all solids during this session and refused optimal positioning for p.o. despite education and encouragement. The session was completed with pt in a slightly reclined position. He consistently exhibited weak coughing with thin liquids via cup, suggesting continued aspiration. Pt's session was abbreviate due to his refusal to participate further. It is recommended that the current diet be continued and SLP will continue to follow pt.    HPI HPI: Pt is an 87 y.o. male with medical history significant of COPD, CAD status post CABG, HTN, HLD, dementia, DM who presented the emergency department with reported fall and shortness of breath. Pt found to have acute hypoxic respiratory failure due to COVID-19 pneumonia. CXR 12/29: Ill-defined airspace opacity in the lower lung region superimposed on underlying fibrosis. Per referring MD's note, pt appeared "to be accumulating some secretions and wheezing/gurgling at times." and SLP was consulted "to make sure no overt aspiration".      SLP Plan  Continue with current plan of care       Recommendations  Diet recommendations: Dysphagia 1 (puree);Nectar-thick liquid Liquids provided via: Cup;No straw Medication Administration: Crushed with puree Supervision: Staff to assist with self feeding;Full supervision/cueing for compensatory strategies Compensations: Slow rate;Small sips/bites Postural Changes and/or Swallow Maneuvers: Upright 30-60 min after meal                Oral Care Recommendations: Oral care BID Follow up  Recommendations: Skilled Nursing facility SLP Visit Diagnosis: Dysphagia, unspecified (R13.10) Plan: Continue with current plan of care       Troy [name] I. Vear Clock, MS, CCC-SLP Acute Rehabilitation Services Office number [phone] Pager [phone]                Troy [name] 09/18/2020, 5:29 PM

## 2020-09-18 NOTE — TOC Progression Note (Signed)
Transition of Care [facility]) - Progression Note    Patient Details  Name: Troy [name] MRN: [id number] Date of Birth: [birth year]  [facility] CM/SW Contact  Mearl Latin, LCSW Phone Number: 09/18/2020, 5:32 PM  Clinical Narrative:    [facility] able to accept patient tomorrow pending insurance authorization. CSW faxed clinicals to [facility].    Expected Discharge Plan: Skilled Nursing Facility Barriers to Discharge: Continued Medical Work up,Insurance Authorization,SNF Pending bed offer  Expected Discharge Plan and Services Expected Discharge Plan: Skilled Nursing Facility   Discharge Planning Services: CM Consult Post Acute Care Choice: Skilled Nursing Facility Living arrangements for the past 2 months: Single Family Home                                       Social Determinants of Health (SDOH) Interventions    Readmission Risk Interventions No flowsheet data found.

## 2020-09-19 ENCOUNTER — Inpatient Hospital Stay (HOSPITAL_COMMUNITY): Payer: Medicare Other

## 2020-09-19 ENCOUNTER — Ambulatory Visit: Payer: Medicare Other

## 2020-09-19 ENCOUNTER — Ambulatory Visit: Payer: Medicare Other | Admitting: Podiatry

## 2020-09-19 DIAGNOSIS — R0902 Hypoxemia: Secondary | ICD-10-CM | POA: Diagnosis not present

## 2020-09-19 LAB — COMPREHENSIVE METABOLIC PANEL
ALT: 35 U/L (ref 0–44)
AST: 25 U/L (ref 15–41)
Albumin: 2.3 g/dL — ABNORMAL LOW (ref 3.5–5.0)
Alkaline Phosphatase: 88 U/L (ref 38–126)
Anion gap: 7 (ref 5–15)
BUN: 37 mg/dL — ABNORMAL HIGH (ref 8–23)
CO2: 19 mmol/L — ABNORMAL LOW (ref 22–32)
Calcium: 8.2 mg/dL — ABNORMAL LOW (ref 8.9–10.3)
Chloride: 111 mmol/L (ref 98–111)
Creatinine, Ser: 1.55 mg/dL — ABNORMAL HIGH (ref 0.61–1.24)
GFR, Estimated: 44 mL/min — ABNORMAL LOW (ref >60)
Glucose, Bld: 129 mg/dL — ABNORMAL HIGH (ref 70–99)
Potassium: 4.6 mmol/L (ref 3.5–5.1)
Sodium: 137 mmol/L (ref 135–145)
Total Bilirubin: 1 mg/dL (ref 0.3–1.2)
Total Protein: 5.6 g/dL — ABNORMAL LOW (ref 6.5–8.1)

## 2020-09-19 LAB — CBC
HCT: 31.4 % — ABNORMAL LOW (ref 39.0–52.0)
Hemoglobin: 10.5 g/dL — ABNORMAL LOW (ref 13.0–17.0)
MCH: 30.6 pg (ref 26.0–34.0)
MCHC: 33.4 g/dL (ref 30.0–36.0)
MCV: 91.5 fL (ref 80.0–100.0)
Platelets: 196 10*3/uL (ref 150–400)
RBC: 3.43 MIL/uL — ABNORMAL LOW (ref 4.22–5.81)
RDW: 14 % (ref 11.5–15.5)
WBC: 11.9 10*3/uL — ABNORMAL HIGH (ref 4.0–10.5)
nRBC: 0 % (ref 0.0–0.2)

## 2020-09-19 LAB — GLUCOSE, CAPILLARY
Glucose-Capillary: 67 mg/dL — ABNORMAL LOW (ref 70–99)
Glucose-Capillary: 142 mg/dL — ABNORMAL HIGH (ref 70–99)
Glucose-Capillary: 145 mg/dL — ABNORMAL HIGH (ref 70–99)
Glucose-Capillary: 190 mg/dL — ABNORMAL HIGH (ref 70–99)

## 2020-09-19 MED ORDER — TAMSULOSIN HCL 0.4 MG PO CAPS: 0.4000 mg | ORAL_CAPSULE | Freq: Every day | ORAL | Status: AC

## 2020-09-19 MED ORDER — APIXABAN 2.5 MG PO TABS: 2.5000 mg | ORAL_TABLET | Freq: Two times a day (BID) | ORAL | Status: DC

## 2020-09-19 MED ORDER — LEVALBUTEROL TARTRATE 45 MCG/ACT IN AERO
6.0000 | INHALATION_SPRAY | RESPIRATORY_TRACT | Status: DC | PRN
  Filled 2020-09-19: qty 15

## 2020-09-19 NOTE — Discharge Summary (Addendum)
Troy [name] [id number] DOB: 1933-08-11 DOA: 09/10/2020  PCP: [name], MD  Admit date: 09/10/2020  Discharge date: 09/19/2020  Admitted From: Home   Disposition:  SNF   Recommendations for Outpatient Follow-up:   Follow up with PCP in 1-2 weeks  PCP Please obtain BMP/CBC, 2 view CXR in 1week,  (see Discharge instructions)   PCP Please follow up on the following pending results:    Home Health: None   Equipment/Devices: None  Consultations: None  Discharge Condition: Stable    CODE STATUS: DNR    Diet Recommendation:   Diet Order            DIET - DYS 1 Room service appropriate? No; Fluid consistency: Nectar Thick  Diet effective now                  Chief Complaint  Patient presents with  . Shortness of Breath     Brief history of present illness from the day of admission and additional interim summary    Patient is a 87 y.o. male with PMHx of COPD, CAD s/p CABG, HTN, HLD, DM-2, HL, tobacco use-presented with fall/shortness of breath-found to have acute hypoxic respiratory failure due to COVID-19 pneumonia.  Hospital course complicated by development of A. fib with RVR and aspiration pneumonia provoking bronchospasm/COPD exacerbation.  COVID-19 vaccinated status: Unvaccinated  Significant Events: 12/26>> Admit to [facility] for hypoxia due to COVID-19 pneumonia 12/28>> A. fib with RVR  Significant studies: 12/26>> CT head: No acute abnormality 12/26>> CT C-spine: No acute abnormality 12/26>> CT chest: Emphysematous changes-biapical pleural thickening.  1 cm right lower lobe nodule, 7-8 mm left lower lobe nodule.  Mild dilatation of the ascending aortic aorta measuring 3.7 cm 12/27>> Echo: EF 55-60% 12/29>> chest x-ray: Ill-defined airspace opacity in the lower lung region  superimposed on underlying fibrosis. 12/29>> lower extremity Doppler: No DVT  COVID-19 medications: Steroids: 12/26>> Remdesivir: 12/26>>12/30 Baricitinib: 12/28>>  Antibiotics: Unasyn: 12/30 x 5 days                                                                 Hospital Course   Acute Hypoxic Resp Failure due to Covid 19 Viral pneumonia,aspiration pneumonia and COPD exacerbation and aspiration pneumonia : Had severe hypoxemia and was treated with IV steroids remdesivir and Baricitinib, now much improved and down to 2 L nasal cannula oxygen at rest and symptom-free, also completed 5 days of Unasyn for aspiration pneumonia, continue dysphagia 1 diet with nectar thick liquids as he remains high risk for aspiration, discharged to SNF with 2 L nasal cannula oxygen, must see speech therapy at SNF.  SpO2: 94 % O2 Flow Rate (L/min): 2 L/min  Atrial fibrillation with RVR: Remains in A. fib-rate better controlled-continue metoprolol-remains on Eliquis.  CHA2DS2-VASc of around  4.  COPD exacerbation:  Resolved after IV steroids, continue home nebulizer treatments.    Chronic diastolic heart failure: No signs of volume overload..  AKI on CKD stage IIIa: AKI hemodynamically mediated--creatinine slowly improving-but seems to have plateaued around 1.7-1.8 range (baseline of 1.2-1.4).    Renal function at baseline.   CAD-s/p CABG: No anginal symptoms-continue statin/beta-blocker-no longer on ASA as patient on anticoagulation  HTN: BP controlled--continue metoprolol  HLD: Continue statin  DM-2:  On Glucophage continue.  Check CBGs before every meal at bedtime for a few days at nursing home to document stability.  Recently coming off of IV steroids  Discharge diagnosis     Active Problems:   Diabetes mellitus type 2 with complications ([place])   Hyperlipidemia associated with type 2 diabetes mellitus (HCC)   Tobacco use   COPD (chronic obstructive pulmonary disease) (HCC)   PUD  (peptic ulcer disease)   Thrombocytopenia (HCC)   Dementia without behavioral disturbance (HCC)   Severe hypoxemia   Acute respiratory disease due to COVID-19 virus   Acute respiratory failure with hypoxemia (HCC)   AKI (acute kidney injury) ([place])   Chronic diastolic CHF (congestive heart failure) [facility])    Discharge instructions    Discharge Instructions    Discharge instructions   Complete by: As directed    Follow with Primary MD [name], MD in 7 days   Get CBC, CMP, 2 view Chest X ray -  checked next visit within 1 week by Primary MD or SNF MD   Activity: As tolerated with Full fall precautions use walker/cane & assistance as needed  Disposition SNF  Diet: Dysphagia 1 diet, nectar thick liquids with full feeding assistance and aspiration precautions.  Special Instructions: If you have smoked or chewed Tobacco  in the last 2 yrs please stop smoking, stop any regular Alcohol  and or any Recreational drug use.  On your next visit with your primary care physician please Get Medicines reviewed and adjusted.  Please request your Prim.MD to go over all Hospital Tests and Procedure/Radiological results at the follow up, please get all Hospital records sent to your Prim MD by signing hospital release before you go home.  If you experience worsening of your admission symptoms, develop shortness of breath, life threatening emergency, suicidal or homicidal thoughts you must seek medical attention immediately by calling 911 or calling your MD immediately  if symptoms less severe.  You Must read complete instructions/literature along with all the possible adverse reactions/side effects for all the Medicines you take and that have been prescribed to you. Take any new Medicines after you have completely understood and accpet all the possible adverse reactions/side effects.   Increase activity slowly   Complete by: As directed       Discharge Medications   Allergies as of  09/19/2020      Reactions   Codeine    REACTION: vomiting   Cyclobenzaprine Hcl    REACTION: vomiting   Hydrocodone Nausea Only, Other (See Comments)   "crazy"   Tramadol Other (See Comments)   woozy   Valium Other (See Comments)   sedation      Medication List    STOP taking these medications   aspirin 81 MG tablet     TAKE these medications   albuterol 108 (90 Base) MCG/ACT inhaler Commonly known as: VENTOLIN HFA Inhale 2 puffs into the lungs every 6 (six) hours as needed for wheezing or shortness of breath.   apixaban 2.5 MG  Tabs tablet Commonly known as: ELIQUIS Take 1 tablet (2.5 mg total) by mouth 2 (two) times daily.   Breo Ellipta 100-25 MCG/INH Aepb Generic drug: fluticasone furoate-vilanterol USE 1 INHALATION BY MOUTH  INTO THE LUNGS DAILY What changed: See the new instructions.   donepezil 10 MG tablet Commonly known as: ARICEPT Take 1 tablet (10 mg total) by mouth at bedtime.   isosorbide mononitrate 30 MG 24 hr tablet Commonly known as: IMDUR Take 1 tablet (30 mg total) by mouth daily.   magnesium hydroxide 400 MG/5ML suspension Commonly known as: Milk of Magnesia Take 15 mLs by mouth daily as needed for moderate constipation.   memantine 10 MG tablet Commonly known as: NAMENDA Take 1 tablet (10 mg total) by mouth 2 (two) times daily.   metFORMIN 500 MG tablet Commonly known as: GLUCOPHAGE Take 1 tablet (500 mg total) by mouth daily with breakfast.   metoprolol tartrate 25 MG tablet Commonly known as: LOPRESSOR TAKE ONE-HALF TABLET BY  MOUTH TWICE DAILY What changed:   how much to take  when to take this  additional instructions   omeprazole 40 MG capsule Commonly known as: PRILOSEC TAKE 1 CAPSULE BY MOUTH  DAILY   sertraline 50 MG tablet Commonly known as: ZOLOFT TAKE 1 TABLET BY MOUTH  DAILY   simvastatin 40 MG tablet Commonly known as: ZOCOR TAKE 1 TABLET BY MOUTH AT  BEDTIME   tamsulosin 0.4 MG Caps capsule Commonly known  as: FLOMAX Take 1 capsule (0.4 mg total) by mouth daily. Start taking on: September 20, 2020   vitamin B-12 500 MCG tablet Commonly known as: CYANOCOBALAMIN Take 1 tablet (500 mcg total) by mouth every Monday, Wednesday, and Friday. What changed: when to take this        Follow-up Information    [name], MD. Schedule an appointment as soon as possible for a visit in 1 week(s).   Specialty: Family Medicine Contact information: [place] [address] [phone]               Major procedures and Radiology Reports - PLEASE review detailed and final reports thoroughly  -       DG Chest 1 View  Result Date: 09/10/2020 CLINICAL DATA:  Shortness of breath EXAM: CHEST  1 VIEW COMPARISON:  06/30/2018 FINDINGS: Cardiac shadow is within normal limits. Postsurgical changes are seen. The lungs are hyperinflated. No focal infiltrate or sizable effusion is seen. No bony abnormality is noted. IMPRESSION: COPD without acute abnormality. Electronically Signed   By: Inez Catalina [name]   On: 09/10/2020 13:45   CT Head Wo Contrast  Result Date: 09/10/2020 CLINICAL DATA:  Recent head trauma, initial encounter EXAM: CT HEAD WITHOUT CONTRAST TECHNIQUE: Contiguous axial images were obtained from the base of the skull through the vertex without intravenous contrast. COMPARISON:  None. FINDINGS: Brain: No evidence of acute infarction, hemorrhage, hydrocephalus, extra-axial collection or mass lesion/mass effect. Chronic atrophic and ischemic changes are noted. Vascular: No hyperdense vessel or unexpected calcification. Skull: Normal. Negative for fracture or focal lesion. Sinuses/Orbits: No acute finding. Other: None. IMPRESSION: Chronic atrophic and ischemic changes without acute abnormality. Electronically Signed   By: Inez Catalina [name]   On: 09/10/2020 14:28   CT CHEST WO CONTRAST  Result Date: 09/10/2020 CLINICAL DATA:  Respiratory failure.  COVID-19 positive.   Hypoxia. EXAM: CT CHEST WITHOUT CONTRAST TECHNIQUE: Multidetector CT imaging of the chest was performed following the standard protocol without IV contrast. COMPARISON:  None. FINDINGS: Cardiovascular: Heart is normal size. Patient is post median sternotomy/CABG. Mild ectasia of the ascending thoracic aorta measuring 3.7 cm. There is calcified plaque throughout the thoracic aorta. Remaining vascular structures are unremarkable. Mediastinum/Nodes: No mediastinal or hilar adenopathy. Remaining mediastinal structures are normal. Lungs/Pleura: Lungs are hyperexpanded with centrilobular emphysematous disease. Biapical pleural thickening right worse than left. There is a 1 cm nodule opacity over the posteromedial right lower lobe with subtle adjacent possible hazy nodular opacification 7-8 mm nodule over the more superior aspect of the left lower lobe. Mild linear density with focal nodularity over the right middle lobe medially. No effusion. Airways are normal. Upper Abdomen: Calcified plaque over the abdominal aorta. No acute findings Musculoskeletal: Degenerative change of the spine. IMPRESSION: 1. Emphysematous disease with biapical pleural thickening right worse than left. 1 cm nodule opacity over the posteromedial right lower lobe with subtle adjacent hazy nodular opacification. 7-8 mm nodule over the more superior aspect of the left lower lobe. Findings may be seen with atypical infection versus inflammatory process and less likely underlying neoplastic/metastatic disease. Recommend follow-up CT 6-8 weeks. 2. Emphysema and aortic atherosclerosis. Mild dilatation of the ascending thoracic aorta measuring 3.7 cm. Recommend annual imaging followup by CTA or MRA. This recommendation follows 2010 ACCF/AHA/AATS/ACR/ASA/SCA/SCAI/SIR/STS/SVM Guidelines for the Diagnosis and Management of Patients with Thoracic Aortic Disease. Circulation.2010; [id number]. Aortic aneurysm NOS (ICD10-I71.9). Aortic Atherosclerosis  (ICD10-I70.0) and Emphysema (ICD10-J43.9). Electronically Signed   By: Marin Olp [name]   On: 09/10/2020 18:08   CT Cervical Spine Wo Contrast  Result Date: 09/10/2020 CLINICAL DATA:  Recent trauma with neck pain, initial encounter EXAM: CT CERVICAL SPINE WITHOUT CONTRAST TECHNIQUE: Multidetector CT imaging of the cervical spine was performed without intravenous contrast. Multiplanar CT image reconstructions were also generated. COMPARISON:  None. FINDINGS: Alignment: Within normal limits. Skull base and vertebrae: 7 cervical segments are well visualized. Vertebral body height is well maintained. Multilevel osteophytic changes and facet hypertrophic changes are seen. No acute fracture or acute facet abnormality is noted. The odontoid is within normal limits. Soft tissues and spinal canal: Surrounding soft tissue structures are within normal limits with the exception of vascular calcifications. Upper chest: Pleural and parenchymal scarring is noted in the apices bilaterally. Other: None IMPRESSION: Multilevel degenerative change without acute abnormality. Electronically Signed   By: Inez Catalina [name]   On: 09/10/2020 14:32   DG Chest Port 1 View  Result Date: 09/19/2020 CLINICAL DATA:  Shortness of breath.  COVID. EXAM: PORTABLE CHEST 1 VIEW COMPARISON:  09/13/2020.  06/30/2018.  CT 09/10/2020. FINDINGS: Prior CABG. Heart size normal. Bilateral interstitial prominence again noted. Pneumonitis could present this fashion. Slight interim improvement from prior exam. No pleural effusion or pneumothorax. Reference is made to prior CT report of 09/10/2020 for discussion of pulmonary nodule present. No acute bony abnormality. IMPRESSION: 1. Prior CABG. Heart size normal. 2. Bilateral interstitial prominence again noted. Pneumonitis could present in this fashion. Slight interim improvement from prior exam. 3. Reference is made to prior CT report of 09/10/2020 for discussion of pulmonary nodule present.  Electronically Signed   By: Marcello Moores  Register   On: 09/19/2020 08:45   DG Chest Port 1 View  Result Date: 09/13/2020 CLINICAL DATA:  Shortness of breath.  Reported COVID-19 positive EXAM: PORTABLE CHEST 1 VIEW COMPARISON:  Chest radiograph and chest CT September 10, 2020 FINDINGS: There are areas of underlying fibrotic change. There is ill-defined airspace opacity superimposed on apparent fibrotic change in the lower lobe regions. No  consolidation. The heart size is normal. The pulmonary vascularity is stable and appears consistent with underlying emphysematous change. Patient is status post coronary artery bypass grafting. No adenopathy. There is aortic atherosclerosis. No appreciable bone lesions. IMPRESSION: The appearance is felt to be indicative of ill-defined airspace opacity in the lower lung regions superimposed on underlying fibrotic type change. Underlying emphysematous change noted with decreased vascularity in the upper lobes, stable. Stable cardiac silhouette. Status post coronary artery bypass grafting. Aortic Atherosclerosis (ICD10-I70.0) and Emphysema (ICD10-J43.9). Electronically Signed   By: Bretta Bang III [name]   On: 09/13/2020 08:46   DG Swallowing Func-Speech Pathology  Result Date: 09/14/2020 Objective Swallowing Evaluation: Type of Study: MBS-Modified Barium Swallow Study  Patient Details Name: ELIOTT AMPARAN MRN: [id number] Date of Birth: October 16, 1932 Today's Date: 09/14/2020 Time: SLP Start Time (ACUTE ONLY): 1355 -SLP Stop Time (ACUTE ONLY): 1409 SLP Time Calculation (min) (ACUTE ONLY): 14 min Past Medical History: Past Medical History: Diagnosis Date . Aorto-iliac atherosclerosis (HCC) 08/2015  by xray . Barrett's esophagus   EGD 2012, rpt 2014, longterm PPI . Chronic idiopathic constipation 07/17/2010 . Chronic obstructive pulmonary disease (COPD) (HCC)   spirometry with moderate COPD . Coronary atherosclerosis of artery bypass graft  . Diabetes mellitus type II ~2008 .  Gastrointestinal ulcer 04/2011  with hematemesis s/p EGD by [facility] . GERD (gastroesophageal reflux disease)  . Hearing loss  . HLD (hyperlipidemia)  . HNP (herniated nucleus pulposus), lumbar 2013  with compression of L5 nerve root and foot drop, also with lumbar DDD s/p surgery . Hypertrophy of prostate without urinary obstruction and other lower urinary tract symptoms (LUTS)  . Memory loss  . Paroxysmal SVT (supraventricular tachycardia) (HCC)   a. 3/14 => converted in ED with adenosine to AFib => NSR;  b. Echo 4/14:  Mild LVH, mild FBSH, EF 55-60%, Gr 1 DD . Prostate nodule ~2009  Left-benign s/p eval Uro WNL ([place]) . Thrombocytopenia (HCC)  . Tobacco use disorder  Past Surgical History: Past Surgical History: Procedure Laterality Date . COLONOSCOPY  03/2009  small hemorrhoids, 2 polyps (hyperplastic and adenomatous), rpt 5 yrs (Dr. Maryjane Hurter) - pt declined rpt scheduling . COLONOSCOPY  10/2014  2 hyperplastic polyp, hemmorhoids ([place]) . CORONARY ARTERY BYPASS GRAFT  1999  Dr. Cornelius Moras . ESOPHAGOGASTRODUODENOSCOPY  05/16/2011  after hematemesis, no lesions found, consistent with barrett's esophagus rpt 2 yrs . ESOPHAGOGASTRODUODENOSCOPY  06/2013  barrett's, small HH, recheck 2-3 yrs ([place]) . HEMORRHOID SURGERY   . LUMBAR LAMINECTOMY/DECOMPRESSION MICRODISCECTOMY  01/31/2012  Procedure: LUMBAR LAMINECTOMY/DECOMPRESSION MICRODISCECTOMY 1 LEVEL;  Surgeon: Carmela Hurt, MD;  Location: [facility];  Service: Neurosurgery;  Laterality: Left;  LEFT Lumbar Four-Five Diskectomy . NOSE SURGERY   . ROTATOR CUFF REPAIR  04/2010  right (but has bilat) Dr. Yisroel Ramming ([facility]) HPI: Pt is an 87 y.o. male with medical history significant of COPD, CAD status post CABG, HTN, HLD, dementia, DM who presented the emergency department with reported fall and shortness of breath. Pt found to have acute hypoxic respiratory failure due to COVID-19 pneumonia. CXR 12/29: Ill-defined airspace opacity in the lower lung region superimposed  on underlying fibrosis. Per referring MD's note, pt appeared "to be accumulating some secretions and wheezing/gurgling at times." and SLP was consulted "to make sure no overt aspiration".  No data recorded Assessment / Plan / Recommendation CHL IP CLINICAL IMPRESSIONS 09/14/2020 Clinical Impression Pt presents with oropharyngeal dysphagia characterized by reduced bolus propulsion, reduced lingual retraction, and a a pharyngeal delay. He  demonstrated base of tongue residue, vallecular residue, and pyriform sinus reidue which were reduced with secondary swallows. Greater than 50% of thin liquid boluses was frequently propelled to the pyriform sinuses prior to any attempt at airway closure. Penetration and aspiration (PAS 5, 7) were demonstrated with thin liquids and resulted in weak, ineffective coughing. Penetration (PAS 3) was noted with nectar thick liquids via straw, but no via cup. No functional benefit was noted with a chin tuck posture. Additional trials and compensatory strategy attempts were refused by the pt who stated the he was tired and would not participate further. A dysphagia 2 diet with nectar thick liquids via cup is recommended at this time. SLP will follow for dysphagia treatment. SLP Visit Diagnosis Dysphagia, unspecified (R13.10) Attention and concentration deficit following -- Frontal lobe and executive function deficit following -- Impact on safety and function Moderate aspiration risk   CHL IP TREATMENT RECOMMENDATION 09/14/2020 Treatment Recommendations Therapy as outlined in treatment plan below   Prognosis 09/14/2020 Prognosis for Safe Diet Advancement Good Barriers to Reach Goals Severity of deficits Barriers/Prognosis Comment -- CHL IP DIET RECOMMENDATION 09/14/2020 SLP Diet Recommendations Dysphagia 2 (Fine chop) solids;Nectar thick liquid Liquid Administration via Cup;No straw Medication Administration Whole meds with puree Compensations Slow rate;Small sips/bites Postural Changes  Seated upright at 90 degrees;Remain semi-upright after after feeds/meals (Comment)   CHL IP OTHER RECOMMENDATIONS 09/14/2020 Recommended Consults -- Oral Care Recommendations Oral care BID Other Recommendations Order thickener from pharmacy   CHL IP FOLLOW UP RECOMMENDATIONS 09/14/2020 Follow up Recommendations (No Data)   CHL IP FREQUENCY AND DURATION 09/14/2020 Speech Therapy Frequency (ACUTE ONLY) min 2x/week Treatment Duration 2 weeks      CHL IP ORAL PHASE 09/14/2020 Oral Phase Impaired Oral - Pudding Teaspoon -- Oral - Pudding Cup -- Oral - Honey Teaspoon -- Oral - Honey Cup Reduced posterior propulsion Oral - Nectar Teaspoon Premature spillage;Decreased bolus cohesion Oral - Nectar Cup Premature spillage;Decreased bolus cohesion Oral - Nectar Straw Premature spillage;Decreased bolus cohesion Oral - Thin Teaspoon -- Oral - Thin Cup Premature spillage;Decreased bolus cohesion Oral - Thin Straw Premature spillage;Decreased bolus cohesion Oral - Puree Reduced posterior propulsion Oral - Mech Soft Reduced posterior propulsion Oral - Regular Reduced posterior propulsion Oral - Multi-Consistency -- Oral - Pill -- Oral Phase - Comment --  CHL IP PHARYNGEAL PHASE 09/14/2020 Pharyngeal Phase Impaired Pharyngeal- Pudding Teaspoon -- Pharyngeal -- Pharyngeal- Pudding Cup -- Pharyngeal -- Pharyngeal- Honey Teaspoon -- Pharyngeal -- Pharyngeal- Honey Cup Pharyngeal residue - valleculae;Pharyngeal residue - pyriform;Delayed swallow initiation-vallecula;Reduced tongue base retraction Pharyngeal -- Pharyngeal- Nectar Teaspoon -- Pharyngeal -- Pharyngeal- Nectar Cup Pharyngeal residue - valleculae;Pharyngeal residue - pyriform;Delayed swallow initiation-vallecula;Reduced tongue base retraction Pharyngeal Material does not enter airway Pharyngeal- Nectar Straw Pharyngeal residue - valleculae;Pharyngeal residue - pyriform;Delayed swallow initiation-vallecula;Reduced tongue base retraction Pharyngeal Material enters airway,  remains ABOVE vocal cords and not ejected out Pharyngeal- Thin Teaspoon -- Pharyngeal -- Pharyngeal- Thin Cup Pharyngeal residue - valleculae;Pharyngeal residue - pyriform;Delayed swallow initiation-vallecula;Reduced tongue base retraction Pharyngeal Material enters airway, passes BELOW cords and not ejected out despite cough attempt by patient Pharyngeal- Thin Straw Pharyngeal residue - valleculae;Pharyngeal residue - pyriform;Delayed swallow initiation-vallecula;Reduced tongue base retraction Pharyngeal Material enters airway, passes BELOW cords and not ejected out despite cough attempt by patient;Material enters airway, remains ABOVE vocal cords and not ejected out Pharyngeal- Puree Pharyngeal residue - valleculae;Pharyngeal residue - pyriform;Delayed swallow initiation-vallecula;Reduced tongue base retraction Pharyngeal -- Pharyngeal- Mechanical Soft Pharyngeal residue - valleculae;Pharyngeal residue - pyriform;Delayed swallow  initiation-vallecula;Reduced tongue base retraction Pharyngeal -- Pharyngeal- Regular Pharyngeal residue - valleculae;Pharyngeal residue - pyriform;Delayed swallow initiation-vallecula;Reduced tongue base retraction Pharyngeal -- Pharyngeal- Multi-consistency NT Pharyngeal -- Pharyngeal- Pill NT Pharyngeal -- Pharyngeal Comment --  CHL IP CERVICAL ESOPHAGEAL PHASE 09/14/2020 Cervical Esophageal Phase WFL Pudding Teaspoon -- Pudding Cup -- Honey Teaspoon -- Honey Cup -- Nectar Teaspoon -- Nectar Cup -- Nectar Straw -- Thin Teaspoon -- Thin Cup -- Thin Straw -- Puree -- Mechanical Soft -- Regular -- Multi-consistency -- Pill -- Cervical Esophageal Comment -- Shanika I. Hardin Negus, [place], [place] Office number [phone] Pager [phone] Horton Marshall 09/14/2020, 4:14 PM              ECHOCARDIOGRAM COMPLETE  Result Date: 09/11/2020    ECHOCARDIOGRAM REPORT   Patient Name:   BECKUM PETRICK [facility] Date of Exam: 09/11/2020 Medical Rec #:  [id number]         Height:       68.0 in Accession #:    [id number]       Weight:       155.0 lb Date of Birth:  1933-01-01       BSA:          1.834 m Patient Age:    61 years         BP:           135/71 mmHg Patient Gender: M                HR:           77 bpm. Exam Location:  Inpatient Procedure: 2D Echo, Cardiac Doppler and Color Doppler Indications:    I50.40* Unspecified combined systolic (congestive) and diastolic                 (congestive) heart failure  History:        Patient has prior history of Echocardiogram examinations, most                 recent 01/02/2013. CHF, CAD, Abnormal ECG, COPD, Arrythmias:SVT,                 Signs/Symptoms:Shortness of Breath, Dyspnea, Altered Mental                 Status and Alzheimer's; Risk Factors:Dyslipidemia. Covid                 positive.  Sonographer:    Roseanna Rainbow RDCS Referring Phys: White Comments: Technically difficult study due to poor echo windows. Patient could not turn. Study interrupted to answer phone for endo. IMPRESSIONS  1. Left ventricular ejection fraction, by estimation, is 55 to 60%. The left ventricle has normal function. Abnormal sepal motion, no other regional wall motion abnormalities noted. There is mild concentric left ventricular hypertrophy. Left ventricular  diastolic parameters are indeterminate.  2. Right ventricular systolic function is normal. The right ventricular size is normal. Mildly increased right ventricular wall thickness.  3. The mitral valve is grossly normal. No evidence of mitral valve regurgitation.  4. The aortic valve was not well visualized. There is mild calcification of the aortic valve. Aortic valve regurgitation is not visualized. No aortic stenosis is present.  5. Aortic dilatation noted. There is mild dilatation of the aortic root, measuring 40 mm.  6. The inferior vena cava is normal in size with greater than 50% respiratory variability, suggesting right atrial pressure of 3 mmHg. Comparison(s): A  prior  study was performed on 12/23/2012. Prior images reviewed side by side. Compared to prior, increase in aortic root size. FINDINGS  Left Ventricle: Left ventricular ejection fraction, by estimation, is 55 to 60%. The left ventricle has normal function. The left ventricle has no regional wall motion abnormalities. The left ventricular internal cavity size was normal in size. There is  mild concentric left ventricular hypertrophy. Left ventricular diastolic parameters are indeterminate. Right Ventricle: The right ventricular size is normal. Mildly increased right ventricular wall thickness. Right ventricular systolic function is normal. Left Atrium: Left atrial size was normal in size. Right Atrium: Right atrial size was normal in size. Pericardium: The pericardium was not well visualized. Mitral Valve: The mitral valve is grossly normal. No evidence of mitral valve regurgitation. Tricuspid Valve: The tricuspid valve is grossly normal. Tricuspid valve regurgitation is not demonstrated. Aortic Valve: The aortic valve was not well visualized. There is mild calcification of the aortic valve. Aortic valve regurgitation is not visualized. No aortic stenosis is present. Pulmonic Valve: The pulmonic valve was not well visualized. Pulmonic valve regurgitation is not visualized. Aorta: Aortic dilatation noted. There is mild dilatation of the aortic root, measuring 40 mm. Venous: The inferior vena cava is normal in size with greater than 50% respiratory variability, suggesting right atrial pressure of 3 mmHg. IAS/Shunts: The atrial septum is grossly normal.  LEFT VENTRICLE PLAX 2D LVIDd:         4.05 cm     Diastology LVIDs:         2.63 cm     LV e' medial:    7.29 cm/s LV PW:         1.10 cm     LV E/e' medial:  9.1 LV IVS:        1.10 cm     LV e' lateral:   8.27 cm/s LVOT diam:     2.30 cm     LV E/e' lateral: 8.0 LV SV:         94 LV SV Index:   51 LVOT Area:     4.15 cm  LV Volumes (MOD) LV vol d, MOD A2C: 32.1 ml LV  vol d, MOD A4C: 54.6 ml LV vol s, MOD A2C: 10.3 ml LV vol s, MOD A4C: 12.4 ml LV SV MOD A2C:     21.8 ml LV SV MOD A4C:     54.6 ml LV SV MOD BP:      32.1 ml RIGHT VENTRICLE             IVC RV S prime:     14.50 cm/s  IVC diam: 1.80 cm TAPSE (M-mode): 1.7 cm LEFT ATRIUM           Index      RIGHT ATRIUM          Index LA diam:      2.40 cm 1.31 cm/m RA Area:     9.59 cm LA Vol (A2C): 14.7 ml 8.02 ml/m RA Volume:   18.60 ml 10.14 ml/m LA Vol (A4C): 8.7 ml  4.72 ml/m  AORTIC VALVE LVOT Vmax:   116.00 cm/s LVOT Vmean:  70.400 cm/s LVOT VTI:    0.227 m  AORTA Ao Root diam: 4.00 cm MITRAL VALVE MV Area (PHT): 2.20 cm     SHUNTS MV Decel Time: 345 msec     Systemic VTI:  0.23 m MV E velocity: 66.20 cm/s   Systemic Diam: 2.30 cm MV A velocity: 114.00 cm/s MV  E/A ratio:  0.58 Rudean Haskell MD Electronically signed by Rudean Haskell MD Signature Date/Time: 09/11/2020/1:12:15 PM    Final    VAS [place] LOWER EXTREMITY VENOUS (DVT)  Result Date: 09/13/2020  Lower Venous DVT Study Indications: Swelling.  Risk Factors: COVID 19 positive. Comparison Study: No prior studies. Performing Technologist: Oliver Hum RVT  Examination Guidelines: A complete evaluation includes B-mode imaging, spectral Doppler, color Doppler, and power Doppler as needed of all accessible portions of each vessel. Bilateral testing is considered an integral part of a complete examination. Limited examinations for reoccurring indications may be performed as noted. The reflux portion of the exam is performed with the patient in reverse Trendelenburg.  +---------+---------------+---------+-----------+----------+--------------+ RIGHT    CompressibilityPhasicitySpontaneityPropertiesThrombus Aging +---------+---------------+---------+-----------+----------+--------------+ CFV      Full           Yes      Yes                                 +---------+---------------+---------+-----------+----------+--------------+ SFJ       Full                                                        +---------+---------------+---------+-----------+----------+--------------+ FV Prox  Full                                                        +---------+---------------+---------+-----------+----------+--------------+ FV Mid   Full                                                        +---------+---------------+---------+-----------+----------+--------------+ FV DistalFull                                                        +---------+---------------+---------+-----------+----------+--------------+ PFV      Full                                                        +---------+---------------+---------+-----------+----------+--------------+ POP      Full           Yes      Yes                                 +---------+---------------+---------+-----------+----------+--------------+ PTV      Full                                                        +---------+---------------+---------+-----------+----------+--------------+  PERO     Full                                                        +---------+---------------+---------+-----------+----------+--------------+   +---------+---------------+---------+-----------+----------+--------------+ LEFT     CompressibilityPhasicitySpontaneityPropertiesThrombus Aging +---------+---------------+---------+-----------+----------+--------------+ CFV      Full           Yes      Yes                                 +---------+---------------+---------+-----------+----------+--------------+ SFJ      Full                                                        +---------+---------------+---------+-----------+----------+--------------+ FV Prox  Full                                                        +---------+---------------+---------+-----------+----------+--------------+ FV Mid   Full                                                         +---------+---------------+---------+-----------+----------+--------------+ FV DistalFull                                                        +---------+---------------+---------+-----------+----------+--------------+ PFV      Full                                                        +---------+---------------+---------+-----------+----------+--------------+ POP      Full           Yes      Yes                                 +---------+---------------+---------+-----------+----------+--------------+ PTV      Full                                                        +---------+---------------+---------+-----------+----------+--------------+ PERO     Full                                                        +---------+---------------+---------+-----------+----------+--------------+  Summary: RIGHT: - There is no evidence of deep vein thrombosis in the lower extremity.  - No cystic structure found in the popliteal fossa.  LEFT: - There is no evidence of deep vein thrombosis in the lower extremity.  - No cystic structure found in the popliteal fossa.  *See table(s) above for measurements and observations. Electronically signed by Jamelle Haring on 09/13/2020 at 5:38:10 PM.    Final     Micro Results     Recent Results (from the past 240 hour(s))  Resp Panel by RT-PCR (Flu A&B, Covid) Nasopharyngeal Swab     Status: Abnormal   Collection Time: 09/10/20  1:27 PM   Specimen: Nasopharyngeal Swab; Nasopharyngeal(NP) swabs in vial transport medium  Result Value Ref Range Status   SARS Coronavirus 2 by RT PCR POSITIVE (A) NEGATIVE Final    Comment: RESULT CALLED TO, READ BACK BY AND VERIFIED WITH: JOE B. [id number] (NOTE) SARS-CoV-2 target nucleic acids are DETECTED.  The SARS-CoV-2 RNA is generally detectable in upper respiratory specimens during the acute phase of infection. Positive results are indicative of the presence of the identified  virus, but do not rule out bacterial infection or co-infection with other pathogens not detected by the test. Clinical correlation with patient history and other diagnostic information is necessary to determine patient infection status. The expected result is Negative.  Fact Sheet for Patients: EntrepreneurPulse.com.au  Fact Sheet for Healthcare Providers: IncredibleEmployment.be  This test is not yet approved or cleared by the [place] FDA and  has been authorized for detection and/or diagnosis of SARS-CoV-2 by FDA under an Emergency Use Authorization (EUA).  This EUA will remain in effect (meaning this test can be used) for the  duration of  the COVID-19 declaration under Section 564(b)(1) of the Act, 21 U.S.C. section 360bbb-3(b)(1), unless the authorization is terminated or revoked sooner.     Influenza A by PCR NEGATIVE NEGATIVE Final   Influenza B by PCR NEGATIVE NEGATIVE Final    Comment: (NOTE) The Xpert Xpress SARS-CoV-2/FLU/RSV plus assay is intended as an aid in the diagnosis of influenza from Nasopharyngeal swab specimens and should not be used as a sole basis for treatment. Nasal washings and aspirates are unacceptable for Xpert Xpress SARS-CoV-2/FLU/RSV testing.  Fact Sheet for Patients: EntrepreneurPulse.com.au  Fact Sheet for Healthcare Providers: IncredibleEmployment.be  This test is not yet approved or cleared by the [place] FDA and has been authorized for detection and/or diagnosis of SARS-CoV-2 by FDA under an Emergency Use Authorization (EUA). This EUA will remain in effect (meaning this test can be used) for the duration of the COVID-19 declaration under Section 564(b)(1) of the Act, 21 U.S.C. section 360bbb-3(b)(1), unless the authorization is terminated or revoked.  Performed at [facility], [place] [address], [place], [place] [address]     Today   Subjective     Shelia Ohler today has no headache,no chest abdominal pain,no new weakness tingling or numbness, feels much better     Objective   Blood pressure 128/72, pulse 80, temperature 98.5 F (36.9 C), temperature source Oral, resp. rate 16, height 5\' 8"  (1.727 m), weight 69.3 kg, SpO2 94 %.   Intake/Output Summary (Last 24 hours) at 09/19/2020 0937 Last data filed at 09/19/2020 0400 Gross per 24 hour  Intake 340.45 ml  Output --  Net 340.45 ml    Exam  Awake Alert, No new F.N deficits, Normal affect [place].AT,PERRAL Supple Neck,No JVD, No cervical lymphadenopathy appriciated.  Symmetrical Chest wall  movement, Good air movement bilaterally, CTAB RRR,No Gallops,Rubs or new Murmurs, No Parasternal Heave +ve B.Sounds, Abd Soft, Non tender, No organomegaly appriciated, No rebound -guarding or rigidity. No Cyanosis, Clubbing or edema, No new Rash or bruise   Data Review   CBC w Diff:  Lab Results  Component Value Date   WBC 11.9 (H) 09/19/2020   HGB 10.5 (L) 09/19/2020   HCT 31.4 (L) 09/19/2020   PLT 196 09/19/2020   LYMPHOPCT 9 09/15/2020   MONOPCT 6 09/15/2020   EOSPCT 0 09/15/2020   BASOPCT 0 09/15/2020    CMP:  Lab Results  Component Value Date   NA 137 09/19/2020   K 4.6 09/19/2020   CL 111 09/19/2020   CO2 19 (L) 09/19/2020   BUN 37 (H) 09/19/2020   CREATININE 1.55 (H) 09/19/2020   PROT 5.6 (L) 09/19/2020   ALBUMIN 2.3 (L) 09/19/2020   BILITOT 1.0 09/19/2020   ALKPHOS 88 09/19/2020   AST 25 09/19/2020   ALT 35 09/19/2020  .   Total Time in preparing paper work, data evaluation and todays exam - 19 minutes  Lala Lund M.D on 09/19/2020 at 9:37 AM  [facility]

## 2020-09-19 NOTE — TOC Transition Note (Signed)
Transition of Care [facility]) - CM/SW Discharge Note   Patient Details  Name: SERGI GELLNER MRN: [id number] Date of Birth: October 01, 1932  [facility] CM/SW Contact:  Mearl Latin, LCSW Phone Number: 09/19/2020, 3:10 PM   Clinical Narrative:    Patient will DC to: [facility] Anticipated DC date: 09/19/20 Family notified: Son, Roe Coombs Transport by: Sharin Mons   Per MD patient ready for DC to [facility]. RN to call report prior to discharge [phone]). RN, patient, patient's family, and facility notified of DC. Discharge Summary and FL2 sent to facility. DC packet on chart. Ambulance transport requested for patient.   CSW will sign off for now as social work intervention is no longer needed. Please consult [place] again if new needs arise.      Final next level of care: Skilled Nursing Facility Barriers to Discharge: Barriers Resolved   Patient Goals and CMS Choice Patient states their goals for this hospitalization and ongoing recovery are:: For pt to return home safely. CMS Medicare.gov Compare Post Acute Care list provided to:: Patient Represenative (must comment) Roe Coombs, son) Choice offered to / list presented to : Adult Children  Discharge Placement   Existing PASRR number confirmed : 09/19/20          Patient chooses bed at: [facility] Patient to be transferred to facility by: ptar Name of family member notified: Son Patient and family notified of of transfer: 09/19/20  Discharge Plan and Services In-house Referral: Clinical Social Work Discharge Planning Services: [employer] Consult Post Acute Care Choice: Skilled Nursing Facility                               Social Determinants of Health (SDOH) Interventions     Readmission Risk Interventions No flowsheet data found.

## 2020-09-19 NOTE — Discharge Instructions (Signed)
Follow with Primary MD Eustaquio Boyden, MD in 7 days   Get CBC, CMP, 2 view Chest X ray -  checked next visit within 1 week by Primary MD or SNF MD   Activity: As tolerated with Full fall precautions use walker/cane & assistance as needed  Disposition SNF  Diet: Dysphagia 1 diet, nectar thick liquids with full feeding assistance and aspiration precautions.  Special Instructions: If you have smoked or chewed Tobacco  in the last 2 yrs please stop smoking, stop any regular Alcohol  and or any Recreational drug use.  On your next visit with your primary care physician please Get Medicines reviewed and adjusted.  Please request your Prim.MD to go over all Hospital Tests and Procedure/Radiological results at the follow up, please get all Hospital records sent to your Prim MD by signing hospital release before you go home.  If you experience worsening of your admission symptoms, develop shortness of breath, life threatening emergency, suicidal or homicidal thoughts you must seek medical attention immediately by calling 911 or calling your MD immediately  if symptoms less severe.  You Must read complete instructions/literature along with all the possible adverse reactions/side effects for all the Medicines you take and that have been prescribed to you. Take any new Medicines after you have completely understood and accpet all the possible adverse reactions/side effects.      Information on my medicine - ELIQUIS (apixaban)  This medication education was reviewed with me or my healthcare representative as part of my discharge preparation.  Why was Eliquis prescribed for you? Eliquis was prescribed for you to reduce the risk of a blood clot forming that can cause a stroke if you have a medical condition called atrial fibrillation (a type of irregular heartbeat).  What do You need to know about Eliquis ? Take your Eliquis TWICE DAILY - one tablet in the morning and one tablet in the evening  with or without food. If you have difficulty swallowing the tablet whole please discuss with your pharmacist how to take the medication safely.  Take Eliquis exactly as prescribed by your doctor and DO NOT stop taking Eliquis without talking to the doctor who prescribed the medication.  Stopping may increase your risk of developing a stroke.  Refill your prescription before you run out.  After discharge, you should have regular check-up appointments with your healthcare provider that is prescribing your Eliquis.  In the future your dose may need to be changed if your kidney function or weight changes by a significant amount or as you get older.  What do you do if you miss a dose? If you miss a dose, take it as soon as you remember on the same day and resume taking twice daily.  Do not take more than one dose of ELIQUIS at the same time to make up a missed dose.  Important Safety Information A possible side effect of Eliquis is bleeding. You should call your healthcare provider right away if you experience any of the following: ? Bleeding from an injury or your nose that does not stop. ? Unusual colored urine (red or dark brown) or unusual colored stools (red or black). ? Unusual bruising for unknown reasons. ? A serious fall or if you hit your head (even if there is no bleeding).  Some medicines may interact with Eliquis and might increase your risk of bleeding or clotting while on Eliquis. To help avoid this, consult your healthcare provider or pharmacist prior to using any  new prescription or non-prescription medications, including herbals, vitamins, non-steroidal anti-inflammatory drugs (NSAIDs) and supplements.  This website has more information on Eliquis (apixaban): http://www.eliquis.com/eliquis/home

## 2020-09-19 NOTE — TOC Progression Note (Addendum)
Transition of Care [facility]) - Progression Note    Patient Details  Name: Troy [name] MRN: [id number] Date of Birth: [birth year]  Transition [facility]) CM/SW Contact  Mearl Latin, LCSW Phone Number: 09/19/2020, 11:05 AM  Clinical Narrative:    CSW received authorization for [facility]: # [id number], effective 09/19/20-09/21/20. CSW updated patient's son.   Expected Discharge Plan: Skilled Nursing Facility Barriers to Discharge: Barriers Resolved  Expected Discharge Plan and Services Expected Discharge Plan: Skilled Nursing Facility In-house Referral: Clinical Social Work Discharge Planning Services: CM Consult Post Acute Care Choice: Skilled Nursing Facility Living arrangements for the past 2 months: Single Family Home Expected Discharge Date: 09/19/20                                     Social Determinants of Health (SDOH) Interventions    Readmission Risk Interventions No flowsheet data found.

## 2020-09-19 NOTE — Care Management Important Message (Signed)
Important Message  Patient Details  Name: Troy [name] MRN: [id number] Date of Birth: 1933-06-06   Medicare Important Message Given:  Yes - Important Message mailed due to current National Emergency  Verbal consent obtained due to current National Emergency  Relationship to patient: Self Contact Name: Kaio Kuhlman Call Date: 09/19/20  Time: 1424 Phone: [phone] Outcome: No Answer/Busy Important Message mailed to: Patient address on file    Orson Aloe 09/19/2020, 2:25 PM

## 2020-09-19 NOTE — Consult Note (Signed)
   [facility] Inpatient Consult   09/19/2020  KOSTAS MARROW [birth year] [id number]   [facility] [ACO] Patient:  [employer]   Patient screened for extreme high score for unplanned readmission risk and for length of stay hospitalizations to check transition of care follow up needs for potential [place] Management service.  Review of patient's medical record reveals patient is for a skilled nursing facility level of care for currently rehab noted and 24 hour supervision needs.   Plan:  No current [facility] Care Management needs for transition at this time, as TOC needs to be met at SNF-rehab.  For questions contact:   Natividad Brood, RN BSN [facility] Liaison  [phone] business mobile phone Toll free office [phone]  Fax number: [phone] [email or web address]

## 2020-09-20 ENCOUNTER — Encounter: Payer: Self-pay | Admitting: Adult Health

## 2020-09-20 ENCOUNTER — Other Ambulatory Visit: Payer: Medicare Other

## 2020-09-20 ENCOUNTER — Non-Acute Institutional Stay (SKILLED_NURSING_FACILITY): Payer: Medicare Other | Admitting: Adult Health

## 2020-09-20 DIAGNOSIS — J449 Chronic obstructive pulmonary disease, unspecified: Secondary | ICD-10-CM | POA: Diagnosis not present

## 2020-09-20 DIAGNOSIS — I2510 Atherosclerotic heart disease of native coronary artery without angina pectoris: Secondary | ICD-10-CM | POA: Diagnosis not present

## 2020-09-20 DIAGNOSIS — N1832 Chronic kidney disease, stage 3b: Secondary | ICD-10-CM | POA: Diagnosis not present

## 2020-09-20 DIAGNOSIS — N179 Acute kidney failure, unspecified: Secondary | ICD-10-CM | POA: Diagnosis not present

## 2020-09-20 DIAGNOSIS — U071 COVID-19: Secondary | ICD-10-CM | POA: Diagnosis not present

## 2020-09-20 DIAGNOSIS — J9601 Acute respiratory failure with hypoxia: Secondary | ICD-10-CM | POA: Diagnosis not present

## 2020-09-20 DIAGNOSIS — R2681 Unsteadiness on feet: Secondary | ICD-10-CM | POA: Diagnosis not present

## 2020-09-20 DIAGNOSIS — I5032 Chronic diastolic (congestive) heart failure: Secondary | ICD-10-CM | POA: Diagnosis not present

## 2020-09-20 DIAGNOSIS — J1282 Pneumonia due to coronavirus disease 2019: Secondary | ICD-10-CM

## 2020-09-20 DIAGNOSIS — E118 Type 2 diabetes mellitus with unspecified complications: Secondary | ICD-10-CM | POA: Diagnosis not present

## 2020-09-20 DIAGNOSIS — M6281 Muscle weakness (generalized): Secondary | ICD-10-CM | POA: Diagnosis not present

## 2020-09-20 DIAGNOSIS — J069 Acute upper respiratory infection, unspecified: Secondary | ICD-10-CM | POA: Diagnosis not present

## 2020-09-20 DIAGNOSIS — E538 Deficiency of other specified B group vitamins: Secondary | ICD-10-CM | POA: Diagnosis not present

## 2020-09-20 DIAGNOSIS — Z720 Tobacco use: Secondary | ICD-10-CM | POA: Diagnosis not present

## 2020-09-20 DIAGNOSIS — F0390 Unspecified dementia without behavioral disturbance: Secondary | ICD-10-CM | POA: Diagnosis not present

## 2020-09-20 DIAGNOSIS — Z951 Presence of aortocoronary bypass graft: Secondary | ICD-10-CM | POA: Diagnosis not present

## 2020-09-20 DIAGNOSIS — D518 Other vitamin B12 deficiency anemias: Secondary | ICD-10-CM | POA: Diagnosis not present

## 2020-09-20 DIAGNOSIS — R1310 Dysphagia, unspecified: Secondary | ICD-10-CM | POA: Diagnosis not present

## 2020-09-20 DIAGNOSIS — I25119 Atherosclerotic heart disease of native coronary artery with unspecified angina pectoris: Secondary | ICD-10-CM | POA: Diagnosis not present

## 2020-09-20 DIAGNOSIS — N390 Urinary tract infection, site not specified: Secondary | ICD-10-CM | POA: Diagnosis not present

## 2020-09-20 DIAGNOSIS — J441 Chronic obstructive pulmonary disease with (acute) exacerbation: Secondary | ICD-10-CM

## 2020-09-20 DIAGNOSIS — N171 Acute kidney failure with acute cortical necrosis: Secondary | ICD-10-CM | POA: Diagnosis not present

## 2020-09-20 DIAGNOSIS — N400 Enlarged prostate without lower urinary tract symptoms: Secondary | ICD-10-CM | POA: Diagnosis not present

## 2020-09-20 DIAGNOSIS — E119 Type 2 diabetes mellitus without complications: Secondary | ICD-10-CM | POA: Diagnosis not present

## 2020-09-20 DIAGNOSIS — J431 Panlobular emphysema: Secondary | ICD-10-CM | POA: Diagnosis not present

## 2020-09-20 DIAGNOSIS — I4891 Unspecified atrial fibrillation: Secondary | ICD-10-CM | POA: Diagnosis not present

## 2020-09-20 DIAGNOSIS — I10 Essential (primary) hypertension: Secondary | ICD-10-CM | POA: Diagnosis not present

## 2020-09-20 DIAGNOSIS — I129 Hypertensive chronic kidney disease with stage 1 through stage 4 chronic kidney disease, or unspecified chronic kidney disease: Secondary | ICD-10-CM | POA: Diagnosis not present

## 2020-09-20 DIAGNOSIS — N1831 Chronic kidney disease, stage 3a: Secondary | ICD-10-CM | POA: Diagnosis not present

## 2020-09-20 DIAGNOSIS — D696 Thrombocytopenia, unspecified: Secondary | ICD-10-CM | POA: Diagnosis not present

## 2020-09-20 DIAGNOSIS — E440 Moderate protein-calorie malnutrition: Secondary | ICD-10-CM | POA: Diagnosis not present

## 2020-09-20 DIAGNOSIS — R1312 Dysphagia, oropharyngeal phase: Secondary | ICD-10-CM | POA: Diagnosis not present

## 2020-09-20 DIAGNOSIS — K279 Peptic ulcer, site unspecified, unspecified as acute or chronic, without hemorrhage or perforation: Secondary | ICD-10-CM | POA: Diagnosis not present

## 2020-09-20 DIAGNOSIS — F5102 Adjustment insomnia: Secondary | ICD-10-CM | POA: Diagnosis not present

## 2020-09-20 DIAGNOSIS — E785 Hyperlipidemia, unspecified: Secondary | ICD-10-CM | POA: Diagnosis not present

## 2020-09-20 DIAGNOSIS — D649 Anemia, unspecified: Secondary | ICD-10-CM | POA: Diagnosis not present

## 2020-09-20 NOTE — Progress Notes (Signed)
Location:  [place] Room Number: 307 A Place of Service:  SNF (31) Provider:  Durenda Age, DNP, FNP-BC  Patient Care Team: [name], MD as PCP - General  Extended Emergency Contact Information Primary Emergency Contact: [place] Phone: [phone] Mobile Phone: [phone] Relation: Son Secondary Emergency Contact: Dorothey Baseman Mobile Phone: [phone] Relation: Daughter  Code Status:  DNR  Goals of care: Advanced Directive information Advanced Directives 09/11/2020  Does Patient Have a Medical Advance Directive? Yes  Type of Advance Directive [place]  Does patient want to make changes to medical advance directive? No - Patient declined  Copy of [place] in Chart? Yes - validated most recent copy scanned in chart (See row information)  Would patient like information on creating a medical advance directive? -  Pre-existing out of facility DNR order (yellow form or pink MOST form) -     Chief Complaint  Patient presents with  . Hospitalization Follow-up    Admission to SNF    HPI:  Pt is a 87 y.o. male seen today for hospital follow up visit. He has a PMH of COPD, CAD S/P CABG, hypertension, hyperlipidemia, diabetes mellitus type 2, hyperlipidemia and tobacco use. He was brought in to the hospital S/P fall and was short of breath. He lives alone and son found him sitting in the tub and was short of breath. EMS was called and was reportedly having O2 sat at 60% on room air. He was found to have COVID-19 pneumonia and was treated with remdesivir, Baricitinib and completed 5 days of Unasyn.  O2 2 L with O2 sat 94%.  He is unvaccinated with COVID-19 vaccine.  Hospital course was complicated by development of atrial fibrillation with RVR and aspiration pneumonia causing bronchospasm/COPD exacerbation.  He was seen in the room today.  When asked where he was,  he responded," I am at my own house."  No SOB nor coughing noted.  Past Medical History:  Diagnosis Date  . Aorto-iliac atherosclerosis ([place]) 08/2015   by xray  . Barrett's esophagus    EGD 2012, rpt 2014, longterm PPI  . Chronic idiopathic constipation 07/17/2010  . Chronic obstructive pulmonary disease (COPD) (HCC)    spirometry with moderate COPD  . Coronary atherosclerosis of artery bypass graft   . Diabetes mellitus type II ~2008  . Gastrointestinal ulcer 04/2011   with hematemesis s/p EGD by [facility]  . GERD (gastroesophageal reflux disease)   . Hearing loss   . HLD (hyperlipidemia)   . HNP (herniated nucleus pulposus), lumbar 2013   with compression of L5 nerve root and foot drop, also with lumbar DDD s/p surgery  . Hypertrophy of prostate without urinary obstruction and other lower urinary tract symptoms (LUTS)   . Memory loss   . Paroxysmal SVT (supraventricular tachycardia) ([place])    a. 3/14 => converted in ED with adenosine to AFib => NSR;  b. Echo 4/14:  Mild LVH, mild FBSH, EF 55-60%, Gr 1 DD  . Prostate nodule ~2009   Left-benign s/p eval Uro WNL ([place])  . Thrombocytopenia ([place])   . Tobacco use disorder    Past Surgical History:  Procedure Laterality Date  . COLONOSCOPY  03/2009   small hemorrhoids, 2 polyps (hyperplastic and adenomatous), rpt 5 yrs (Dr. Barron Schmid) - pt declined rpt scheduling  . COLONOSCOPY  10/2014   2 hyperplastic polyp, hemmorhoids ([place])  . [place]  Dr. Roxy Manns  . ESOPHAGOGASTRODUODENOSCOPY  05/16/2011   after hematemesis, no lesions found, consistent with barrett's esophagus rpt 2 yrs  . ESOPHAGOGASTRODUODENOSCOPY  06/2013   barrett's, small HH, recheck 2-3 yrs ([place])  . HEMORRHOID SURGERY    . LUMBAR LAMINECTOMY/DECOMPRESSION MICRODISCECTOMY  01/31/2012   Procedure: LUMBAR LAMINECTOMY/DECOMPRESSION MICRODISCECTOMY 1 LEVEL;  Surgeon: Winfield Cunas, MD;  Location: [place] [facility];  Service: Neurosurgery;  Laterality:  Left;  LEFT Lumbar Four-Five Diskectomy  . NOSE SURGERY    . ROTATOR CUFF REPAIR  04/2010   right (but has bilat) Dr. Latanya Maudlin ([facility])    Allergies  Allergen Reactions  . Codeine     REACTION: vomiting  . Cyclobenzaprine Hcl     REACTION: vomiting  . Hydrocodone Nausea Only and Other (See Comments)    "crazy"  . Tramadol Other (See Comments)    woozy  . Valium Other (See Comments)    sedation    Outpatient Encounter Medications as of 09/20/2020  Medication Sig  . albuterol (VENTOLIN HFA) 108 (90 Base) MCG/ACT inhaler Inhale 2 puffs into the lungs every 6 (six) hours as needed for wheezing or shortness of breath.  [name] apixaban (ELIQUIS) 2.5 MG TABS tablet Take 1 tablet (2.5 mg total) by mouth 2 (two) times daily.  [name] BREO ELLIPTA 100-25 MCG/INH AEPB USE 1 INHALATION BY MOUTH  INTO THE LUNGS DAILY (Patient taking differently: Inhale 1 puff into the lungs daily.)  . donepezil (ARICEPT) 10 MG tablet Take 1 tablet (10 mg total) by mouth at bedtime.  . isosorbide mononitrate (IMDUR) 30 MG 24 hr tablet Take 1 tablet (30 mg total) by mouth daily.  . magnesium hydroxide (MILK OF MAGNESIA) 400 MG/5ML suspension Take 15 mLs by mouth daily as needed for moderate constipation.  . memantine (NAMENDA) 10 MG tablet Take 1 tablet (10 mg total) by mouth 2 (two) times daily.  . metFORMIN (GLUCOPHAGE) 500 MG tablet Take 1 tablet (500 mg total) by mouth daily with breakfast.  . metoprolol tartrate (LOPRESSOR) 25 MG tablet TAKE ONE-HALF TABLET BY  MOUTH TWICE DAILY (Patient taking differently: Take 12.5-25 mg by mouth See admin instructions. Taking 25mg  in the AM and 12.5mg  in the PM)  . omeprazole (PRILOSEC) 40 MG capsule TAKE 1 CAPSULE BY MOUTH  DAILY (Patient taking differently: Take 40 mg by mouth daily.)  . sertraline (ZOLOFT) 50 MG tablet TAKE 1 TABLET BY MOUTH  DAILY (Patient taking differently: Take 50 mg by mouth daily.)  . simvastatin (ZOCOR) 40 MG tablet TAKE 1 TABLET BY MOUTH AT  BEDTIME  (Patient taking differently: Take 40 mg by mouth at bedtime.)  . tamsulosin (FLOMAX) 0.4 MG CAPS capsule Take 1 capsule (0.4 mg total) by mouth daily.  . vitamin B-12 (CYANOCOBALAMIN) 500 MCG tablet Take 1 tablet (500 mcg total) by mouth every Monday, Wednesday, and Friday. (Patient taking differently: Take 500 mcg by mouth daily.)   No facility-administered encounter medications on file as of 09/20/2020.    Review of Systems  Unable to obtain due to dementia    Immunization History  Administered Date(s) Administered  . Fluad Quad(high Dose 65+) 08/02/2019  . Influenza Whole 06/20/2010  . Influenza, High Dose Seasonal PF 06/02/2015, 07/04/2017, 06/15/2018  . Influenza, Seasonal, Injecte, Preservative Fre 06/16/2014  . Influenza-Unspecified 07/17/2016  . Pneumococcal Polysaccharide-23 09/17/2011  . Td 07/01/1999, 08/14/2010   Pertinent  Health Maintenance Due  Topic Date Due  . OPHTHALMOLOGY EXAM  06/16/2012  . COLONOSCOPY (Pts 45-72yrs Insurance coverage will  need to be confirmed)  11/10/2019  . INFLUENZA VACCINE  04/16/2020  . URINE MICROALBUMIN  07/25/2020  . PNA vac Low Risk Adult (2 of 2 - PCV13) 12/14/2025 (Originally 09/16/2012)  . FOOT EXAM  01/23/2021  . HEMOGLOBIN A1C  03/12/2021   Fall Risk  04/10/2020 08/02/2019 07/07/2018 10/15/2017 10/01/2016  Falls in the past year? 0 1 No No No  Number falls in past yr: - 0 - - -  Injury with Fall? - 0 - - -     Vitals:   09/20/20 1100  Weight: 154 lb (69.9 kg)  Height: 5\' 8"  (1.727 m)   Body mass index is 23.42 kg/m.  Physical Exam  GENERAL APPEARANCE: Well nourished. In no acute distress. Normal body habitus SKIN:  Skin is warm and dry.  MOUTH and THROAT: Lips are without lesions. Oral mucosa is moist and without lesions.  RESPIRATORY: Breathing is even & unlabored, BS CTAB CARDIAC: RRR, no murmur,no extra heart sounds, no edema GI: Abdomen soft, normal BS, no masses, no tenderness EXTREMITIES:  Able to move X 4  extremities NEUROLOGICAL: There is no tremor. Speech is clear. Alert to self, disoriented to time and place. PSYCHIATRIC:  Affect and behavior are appropriate  Labs reviewed: Recent Labs    09/11/20 0913 09/11/20 1035 09/12/20 0110 09/13/20 0103 09/14/20 0049 09/15/20 0103 09/16/20 [id number] 09/16/20 0945 09/17/20 0113 09/18/20 0301 09/19/20 0114  NA  --    < > 142 140 141 145  --    < > 137 140 137  K  --    < > 4.8 4.9 4.8 5.1  --    < > 4.6 4.6 4.6  CL  --    < > 112* 114* 112* 116*  --    < > 111 110 111  CO2  --    < > 20* 16* 17* 18*  --    < > 17* 20* 19*  GLUCOSE  --    < > 117* 113* 113* 138*  --    < > 128* 127* 129*  BUN  --    < > 61* 51* 60* 63*  --    < > 42* 41* 37*  CREATININE  --    < > 1.99* 1.57* 1.85* 1.88*  --    < > 1.54* 1.65* 1.55*  CALCIUM  --    < > 8.5* 8.4* 8.6* 8.7*  --    < > 8.1* 8.3* 8.2*  MG 2.1  --  2.1 2.1 2.2 2.3 2.1  --   --   --   --   PHOS 4.1  --  3.1 2.4*  --   --   --   --   --   --   --    < > = values in this interval not displayed.   Recent Labs    09/17/20 0113 09/18/20 0301 09/19/20 0114  AST 32 29 25  ALT 37 38 35  ALKPHOS 80 94 88  BILITOT 0.8 0.9 1.0  PROT 5.6* 5.8* 5.6*  ALBUMIN 2.4* 2.4* 2.3*   Recent Labs    09/13/20 0103 09/14/20 0049 09/15/20 0103 09/16/20 0733 09/17/20 0113 09/18/20 0301 09/19/20 0114  WBC 7.6 7.8 7.4   < > 10.3 11.3* 11.9*  NEUTROABS 6.6 6.6 6.2  --   --   --   --   HGB 12.1* 12.0* 11.6*   < > 9.9* 11.0* 10.5*  HCT 36.5* 36.7* 35.1*   < >  29.5* 33.6* 31.4*  MCV 92.2 92.4 91.4   < > 89.7 92.1 91.5  PLT 85* 107* 104*   < > 142* 171 196   < > = values in this interval not displayed.   Lab Results  Component Value Date   TSH 3.56 07/26/2019   Lab Results  Component Value Date   HGBA1C 6.1 (H) 09/11/2020   Lab Results  Component Value Date   CHOL 141 07/26/2019   HDL 35.80 (L) 07/26/2019   LDLCALC 82 07/26/2019   LDLDIRECT 74.0 08/25/2015   TRIG 118.0 07/26/2019   CHOLHDL 4  07/26/2019    Significant Diagnostic Results in last 30 days:  DG Chest 1 View  Result Date: 09/10/2020 CLINICAL DATA:  Shortness of breath EXAM: CHEST  1 VIEW COMPARISON:  06/30/2018 FINDINGS: Cardiac shadow is within normal limits. Postsurgical changes are seen. The lungs are hyperinflated. No focal infiltrate or sizable effusion is seen. No bony abnormality is noted. IMPRESSION: COPD without acute abnormality. Electronically Signed   By: Inez Catalina [name]   On: 09/10/2020 13:45   CT Head Wo Contrast  Result Date: 09/10/2020 CLINICAL DATA:  Recent head trauma, initial encounter EXAM: CT HEAD WITHOUT CONTRAST TECHNIQUE: Contiguous axial images were obtained from the base of the skull through the vertex without intravenous contrast. COMPARISON:  None. FINDINGS: Brain: No evidence of acute infarction, hemorrhage, hydrocephalus, extra-axial collection or mass lesion/mass effect. Chronic atrophic and ischemic changes are noted. Vascular: No hyperdense vessel or unexpected calcification. Skull: Normal. Negative for fracture or focal lesion. Sinuses/Orbits: No acute finding. Other: None. IMPRESSION: Chronic atrophic and ischemic changes without acute abnormality. Electronically Signed   By: Inez Catalina [name]   On: 09/10/2020 14:28   CT CHEST WO CONTRAST  Result Date: 09/10/2020 CLINICAL DATA:  Respiratory failure.  COVID-19 positive.  Hypoxia. EXAM: CT CHEST WITHOUT CONTRAST TECHNIQUE: Multidetector CT imaging of the chest was performed following the standard protocol without IV contrast. COMPARISON:  None. FINDINGS: Cardiovascular: Heart is normal size. Patient is post median sternotomy/CABG. Mild ectasia of the ascending thoracic aorta measuring 3.7 cm. There is calcified plaque throughout the thoracic aorta. Remaining vascular structures are unremarkable. Mediastinum/Nodes: No mediastinal or hilar adenopathy. Remaining mediastinal structures are normal. Lungs/Pleura: Lungs are hyperexpanded with  centrilobular emphysematous disease. Biapical pleural thickening right worse than left. There is a 1 cm nodule opacity over the posteromedial right lower lobe with subtle adjacent possible hazy nodular opacification 7-8 mm nodule over the more superior aspect of the left lower lobe. Mild linear density with focal nodularity over the right middle lobe medially. No effusion. Airways are normal. Upper Abdomen: Calcified plaque over the abdominal aorta. No acute findings Musculoskeletal: Degenerative change of the spine. IMPRESSION: 1. Emphysematous disease with biapical pleural thickening right worse than left. 1 cm nodule opacity over the posteromedial right lower lobe with subtle adjacent hazy nodular opacification. 7-8 mm nodule over the more superior aspect of the left lower lobe. Findings may be seen with atypical infection versus inflammatory process and less likely underlying neoplastic/metastatic disease. Recommend follow-up CT 6-8 weeks. 2. Emphysema and aortic atherosclerosis. Mild dilatation of the ascending thoracic aorta measuring 3.7 cm. Recommend annual imaging followup by CTA or MRA. This recommendation follows 2010 ACCF/AHA/AATS/ACR/ASA/SCA/SCAI/SIR/STS/SVM Guidelines for the Diagnosis and Management of Patients with Thoracic Aortic Disease. Circulation.2010; [id number]. Aortic aneurysm NOS (ICD10-I71.9). Aortic Atherosclerosis (ICD10-I70.0) and Emphysema (ICD10-J43.9). Electronically Signed   By: Marin Olp [name]   On: 09/10/2020 18:08   CT Cervical  Spine Wo Contrast  Result Date: 09/10/2020 CLINICAL DATA:  Recent trauma with neck pain, initial encounter EXAM: CT CERVICAL SPINE WITHOUT CONTRAST TECHNIQUE: Multidetector CT imaging of the cervical spine was performed without intravenous contrast. Multiplanar CT image reconstructions were also generated. COMPARISON:  None. FINDINGS: Alignment: Within normal limits. Skull base and vertebrae: 7 cervical segments are well visualized. Vertebral  body height is well maintained. Multilevel osteophytic changes and facet hypertrophic changes are seen. No acute fracture or acute facet abnormality is noted. The odontoid is within normal limits. Soft tissues and spinal canal: Surrounding soft tissue structures are within normal limits with the exception of vascular calcifications. Upper chest: Pleural and parenchymal scarring is noted in the apices bilaterally. Other: None IMPRESSION: Multilevel degenerative change without acute abnormality. Electronically Signed   By: Inez Catalina [name]   On: 09/10/2020 14:32   DG Chest Port 1 View  Result Date: 09/19/2020 CLINICAL DATA:  Shortness of breath.  COVID. EXAM: PORTABLE CHEST 1 VIEW COMPARISON:  09/13/2020.  06/30/2018.  CT 09/10/2020. FINDINGS: Prior CABG. Heart size normal. Bilateral interstitial prominence again noted. Pneumonitis could present this fashion. Slight interim improvement from prior exam. No pleural effusion or pneumothorax. Reference is made to prior CT report of 09/10/2020 for discussion of pulmonary nodule present. No acute bony abnormality. IMPRESSION: 1. Prior CABG. Heart size normal. 2. Bilateral interstitial prominence again noted. Pneumonitis could present in this fashion. Slight interim improvement from prior exam. 3. Reference is made to prior CT report of 09/10/2020 for discussion of pulmonary nodule present. Electronically Signed   By: Marcello Moores  Register   On: 09/19/2020 08:45   DG Chest Port 1 View  Result Date: 09/13/2020 CLINICAL DATA:  Shortness of breath.  Reported COVID-19 positive EXAM: PORTABLE CHEST 1 VIEW COMPARISON:  Chest radiograph and chest CT September 10, 2020 FINDINGS: There are areas of underlying fibrotic change. There is ill-defined airspace opacity superimposed on apparent fibrotic change in the lower lobe regions. No consolidation. The heart size is normal. The pulmonary vascularity is stable and appears consistent with underlying emphysematous change. Patient is  status post coronary artery bypass grafting. No adenopathy. There is aortic atherosclerosis. No appreciable bone lesions. IMPRESSION: The appearance is felt to be indicative of ill-defined airspace opacity in the lower lung regions superimposed on underlying fibrotic type change. Underlying emphysematous change noted with decreased vascularity in the upper lobes, stable. Stable cardiac silhouette. Status post coronary artery bypass grafting. Aortic Atherosclerosis (ICD10-I70.0) and Emphysema (ICD10-J43.9). Electronically Signed   By: Lowella Grip III [name]   On: 09/13/2020 08:46   DG Swallowing Func-Speech Pathology  Result Date: 09/14/2020 Objective Swallowing Evaluation: Type of Study: MBS-Modified Barium Swallow Study  Patient Details Name: JHAYLEN TISCHER MRN: [id number] Date of Birth: 07/01/1933 Today's Date: 09/14/2020 Time: SLP Start Time (ACUTE ONLY): [id number] -SLP Stop Time (ACUTE ONLY): 1409 SLP Time Calculation (min) (ACUTE ONLY): 14 min Past Medical History: Past Medical History: Diagnosis Date . Aorto-iliac atherosclerosis ([place]) 08/2015  by xray . Barrett's esophagus   EGD 2012, rpt 2014, longterm PPI . Chronic idiopathic constipation 07/17/2010 . Chronic obstructive pulmonary disease (COPD) (HCC)   spirometry with moderate COPD . Coronary atherosclerosis of artery bypass graft  . Diabetes mellitus type II ~2008 . Gastrointestinal ulcer 04/2011  with hematemesis s/p EGD by [facility] . GERD (gastroesophageal reflux disease)  . Hearing loss  . HLD (hyperlipidemia)  . HNP (herniated nucleus pulposus), lumbar 2013  with compression of L5 nerve root and foot drop, also with  lumbar DDD s/p surgery . Hypertrophy of prostate without urinary obstruction and other lower urinary tract symptoms (LUTS)  . Memory loss  . Paroxysmal SVT (supraventricular tachycardia) ([place])   a. 3/14 => converted in ED with adenosine to AFib => NSR;  b. Echo 4/14:  Mild LVH, mild FBSH, EF 55-60%, Gr 1 DD . Prostate nodule ~2009   Left-benign s/p eval Uro WNL ([place]) . Thrombocytopenia ([place])  . Tobacco use disorder  Past Surgical History: Past Surgical History: Procedure Laterality Date . COLONOSCOPY  03/2009  small hemorrhoids, 2 polyps (hyperplastic and adenomatous), rpt 5 yrs (Dr. Barron Schmid) - pt declined rpt scheduling . COLONOSCOPY  10/2014  2 hyperplastic polyp, hemmorhoids ([place]) . CORONARY ARTERY BYPASS GRAFT  1999  Dr. Roxy Manns . ESOPHAGOGASTRODUODENOSCOPY  05/16/2011  after hematemesis, no lesions found, consistent with barrett's esophagus rpt 2 yrs . ESOPHAGOGASTRODUODENOSCOPY  06/2013  barrett's, small HH, recheck 2-3 yrs ([place]) . HEMORRHOID SURGERY   . LUMBAR LAMINECTOMY/DECOMPRESSION MICRODISCECTOMY  01/31/2012  Procedure: LUMBAR LAMINECTOMY/DECOMPRESSION MICRODISCECTOMY 1 LEVEL;  Surgeon: Winfield Cunas, MD;  Location: [place] [facility];  Service: Neurosurgery;  Laterality: Left;  LEFT Lumbar Four-Five Diskectomy . NOSE SURGERY   . ROTATOR CUFF REPAIR  04/2010  right (but has bilat) Dr. Latanya Maudlin ([facility]) HPI: Pt is an 87 y.o. male with medical history significant of COPD, CAD status post CABG, HTN, HLD, dementia, DM who presented the emergency department with reported fall and shortness of breath. Pt found to have acute hypoxic respiratory failure due to COVID-19 pneumonia. CXR 12/29: Ill-defined airspace opacity in the lower lung region superimposed on underlying fibrosis. Per referring MD's note, pt appeared "to be accumulating some secretions and wheezing/gurgling at times." and SLP was consulted "to make sure no overt aspiration".  No data recorded Assessment / Plan / Recommendation CHL IP CLINICAL IMPRESSIONS 09/14/2020 Clinical Impression Pt presents with oropharyngeal dysphagia characterized by reduced bolus propulsion, reduced lingual retraction, and a a pharyngeal delay. He demonstrated base of tongue residue, vallecular residue, and pyriform sinus reidue which were reduced with secondary swallows. Greater than 50% of  thin liquid boluses was frequently propelled to the pyriform sinuses prior to any attempt at airway closure. Penetration and aspiration (PAS 5, 7) were demonstrated with thin liquids and resulted in weak, ineffective coughing. Penetration (PAS 3) was noted with nectar thick liquids via straw, but no via cup. No functional benefit was noted with a chin tuck posture. Additional trials and compensatory strategy attempts were refused by the pt who stated the he was tired and would not participate further. A dysphagia 2 diet with nectar thick liquids via cup is recommended at this time. SLP will follow for dysphagia treatment. SLP Visit Diagnosis Dysphagia, unspecified (R13.10) Attention and concentration deficit following -- Frontal lobe and executive function deficit following -- Impact on safety and function Moderate aspiration risk   CHL IP TREATMENT RECOMMENDATION 09/14/2020 Treatment Recommendations Therapy as outlined in treatment plan below   Prognosis 09/14/2020 Prognosis for Safe Diet Advancement Good Barriers to Reach Goals Severity of deficits Barriers/Prognosis Comment -- CHL IP DIET RECOMMENDATION 09/14/2020 SLP Diet Recommendations Dysphagia 2 (Fine chop) solids;Nectar thick liquid Liquid Administration via Cup;No straw Medication Administration Whole meds with puree Compensations Slow rate;Small sips/bites Postural Changes Seated upright at 90 degrees;Remain semi-upright after after feeds/meals (Comment)   CHL IP OTHER RECOMMENDATIONS 09/14/2020 Recommended Consults -- Oral Care Recommendations Oral care BID Other Recommendations Order thickener from pharmacy   CHL IP FOLLOW UP RECOMMENDATIONS 09/14/2020 Follow up  Recommendations (No Data)   CHL IP FREQUENCY AND DURATION 09/14/2020 Speech Therapy Frequency (ACUTE ONLY) min 2x/week Treatment Duration 2 weeks      CHL IP ORAL PHASE 09/14/2020 Oral Phase Impaired Oral - Pudding Teaspoon -- Oral - Pudding Cup -- Oral - Honey Teaspoon -- Oral - Honey Cup  Reduced posterior propulsion Oral - Nectar Teaspoon Premature spillage;Decreased bolus cohesion Oral - Nectar Cup Premature spillage;Decreased bolus cohesion Oral - Nectar Straw Premature spillage;Decreased bolus cohesion Oral - Thin Teaspoon -- Oral - Thin Cup Premature spillage;Decreased bolus cohesion Oral - Thin Straw Premature spillage;Decreased bolus cohesion Oral - Puree Reduced posterior propulsion Oral - Mech Soft Reduced posterior propulsion Oral - Regular Reduced posterior propulsion Oral - Multi-Consistency -- Oral - Pill -- Oral Phase - Comment --  CHL IP PHARYNGEAL PHASE 09/14/2020 Pharyngeal Phase Impaired Pharyngeal- Pudding Teaspoon -- Pharyngeal -- Pharyngeal- Pudding Cup -- Pharyngeal -- Pharyngeal- Honey Teaspoon -- Pharyngeal -- Pharyngeal- Honey Cup Pharyngeal residue - valleculae;Pharyngeal residue - pyriform;Delayed swallow initiation-vallecula;Reduced tongue base retraction Pharyngeal -- Pharyngeal- Nectar Teaspoon -- Pharyngeal -- Pharyngeal- Nectar Cup Pharyngeal residue - valleculae;Pharyngeal residue - pyriform;Delayed swallow initiation-vallecula;Reduced tongue base retraction Pharyngeal Material does not enter airway Pharyngeal- Nectar Straw Pharyngeal residue - valleculae;Pharyngeal residue - pyriform;Delayed swallow initiation-vallecula;Reduced tongue base retraction Pharyngeal Material enters airway, remains ABOVE vocal cords and not ejected out Pharyngeal- Thin Teaspoon -- Pharyngeal -- Pharyngeal- Thin Cup Pharyngeal residue - valleculae;Pharyngeal residue - pyriform;Delayed swallow initiation-vallecula;Reduced tongue base retraction Pharyngeal Material enters airway, passes BELOW cords and not ejected out despite cough attempt by patient Pharyngeal- Thin Straw Pharyngeal residue - valleculae;Pharyngeal residue - pyriform;Delayed swallow initiation-vallecula;Reduced tongue base retraction Pharyngeal Material enters airway, passes BELOW cords and not ejected out despite cough  attempt by patient;Material enters airway, remains ABOVE vocal cords and not ejected out Pharyngeal- Puree Pharyngeal residue - valleculae;Pharyngeal residue - pyriform;Delayed swallow initiation-vallecula;Reduced tongue base retraction Pharyngeal -- Pharyngeal- Mechanical Soft Pharyngeal residue - valleculae;Pharyngeal residue - pyriform;Delayed swallow initiation-vallecula;Reduced tongue base retraction Pharyngeal -- Pharyngeal- Regular Pharyngeal residue - valleculae;Pharyngeal residue - pyriform;Delayed swallow initiation-vallecula;Reduced tongue base retraction Pharyngeal -- Pharyngeal- Multi-consistency NT Pharyngeal -- Pharyngeal- Pill NT Pharyngeal -- Pharyngeal Comment --  CHL IP CERVICAL ESOPHAGEAL PHASE 09/14/2020 Cervical Esophageal Phase WFL Pudding Teaspoon -- Pudding Cup -- Honey Teaspoon -- Honey Cup -- Nectar Teaspoon -- Nectar Cup -- Nectar Straw -- Thin Teaspoon -- Thin Cup -- Thin Straw -- Puree -- Mechanical Soft -- Regular -- Multi-consistency -- Pill -- Cervical Esophageal Comment -- Shanika I. Vear ClockPhillips, MS, CCC-SLP Acute Rehabilitation Services Office number [phone] Pager [phone] Scheryl MartenShanika I Phillips 09/14/2020, 4:14 PM              ECHOCARDIOGRAM COMPLETE  Result Date: 09/11/2020    ECHOCARDIOGRAM REPORT   Patient Name:   [facility] Date of Exam: 09/11/2020 Medical Rec #:  [id number]        Height:       68.0 in Accession #:    [id number]       Weight:       155.0 lb Date of Birth:  Apr 07, 1933       BSA:          1.834 m Patient Age:    87 years         BP:           135/71 mmHg Patient Gender: M                HR:  77 bpm. Exam Location:  Inpatient Procedure: 2D Echo, Cardiac Doppler and Color Doppler Indications:    I50.40* Unspecified combined systolic (congestive) and diastolic                 (congestive) heart failure  History:        Patient has prior history of Echocardiogram examinations, most                 recent 01/02/2013. CHF, CAD, Abnormal ECG,  COPD, Arrythmias:SVT,                 Signs/Symptoms:Shortness of Breath, Dyspnea, Altered Mental                 Status and Alzheimer's; Risk Factors:Dyslipidemia. Covid                 positive.  Sonographer:    Roseanna Rainbow RDCS Referring Phys: [place] Comments: Technically difficult study due to poor echo windows. Patient could not turn. Study interrupted to answer phone for endo. IMPRESSIONS  1. Left ventricular ejection fraction, by estimation, is 55 to 60%. The left ventricle has normal function. Abnormal sepal motion, no other regional wall motion abnormalities noted. There is mild concentric left ventricular hypertrophy. Left ventricular  diastolic parameters are indeterminate.  2. Right ventricular systolic function is normal. The right ventricular size is normal. Mildly increased right ventricular wall thickness.  3. The mitral valve is grossly normal. No evidence of mitral valve regurgitation.  4. The aortic valve was not well visualized. There is mild calcification of the aortic valve. Aortic valve regurgitation is not visualized. No aortic stenosis is present.  5. Aortic dilatation noted. There is mild dilatation of the aortic root, measuring 40 mm.  6. The inferior vena cava is normal in size with greater than 50% respiratory variability, suggesting right atrial pressure of 3 mmHg. Comparison(s): A prior study was performed on 12/23/2012. Prior images reviewed side by side. Compared to prior, increase in aortic root size. FINDINGS  Left Ventricle: Left ventricular ejection fraction, by estimation, is 55 to 60%. The left ventricle has normal function. The left ventricle has no regional wall motion abnormalities. The left ventricular internal cavity size was normal in size. There is  mild concentric left ventricular hypertrophy. Left ventricular diastolic parameters are indeterminate. Right Ventricle: The right ventricular size is normal. Mildly increased right ventricular wall  thickness. Right ventricular systolic function is normal. Left Atrium: Left atrial size was normal in size. Right Atrium: Right atrial size was normal in size. Pericardium: The pericardium was not well visualized. Mitral Valve: The mitral valve is grossly normal. No evidence of mitral valve regurgitation. Tricuspid Valve: The tricuspid valve is grossly normal. Tricuspid valve regurgitation is not demonstrated. Aortic Valve: The aortic valve was not well visualized. There is mild calcification of the aortic valve. Aortic valve regurgitation is not visualized. No aortic stenosis is present. Pulmonic Valve: The pulmonic valve was not well visualized. Pulmonic valve regurgitation is not visualized. Aorta: Aortic dilatation noted. There is mild dilatation of the aortic root, measuring 40 mm. Venous: The inferior vena cava is normal in size with greater than 50% respiratory variability, suggesting right atrial pressure of 3 mmHg. IAS/Shunts: The atrial septum is grossly normal.  LEFT VENTRICLE PLAX 2D LVIDd:         4.05 cm     Diastology LVIDs:         2.63 cm     LV  e' medial:    7.29 cm/s LV PW:         1.10 cm     LV E/e' medial:  9.1 LV IVS:        1.10 cm     LV e' lateral:   8.27 cm/s LVOT diam:     2.30 cm     LV E/e' lateral: 8.0 LV SV:         94 LV SV Index:   51 LVOT Area:     4.15 cm  LV Volumes (MOD) LV vol d, MOD A2C: 32.1 ml LV vol d, MOD A4C: 54.6 ml LV vol s, MOD A2C: 10.3 ml LV vol s, MOD A4C: 12.4 ml LV SV MOD A2C:     21.8 ml LV SV MOD A4C:     54.6 ml LV SV MOD BP:      32.1 ml RIGHT VENTRICLE             IVC RV S prime:     14.50 cm/s  IVC diam: 1.80 cm TAPSE (M-mode): 1.7 cm LEFT ATRIUM           Index      RIGHT ATRIUM          Index LA diam:      2.40 cm 1.31 cm/m RA Area:     9.59 cm LA Vol (A2C): 14.7 ml 8.02 ml/m RA Volume:   18.60 ml 10.14 ml/m LA Vol (A4C): 8.7 ml  4.72 ml/m  AORTIC VALVE LVOT Vmax:   116.00 cm/s LVOT Vmean:  70.400 cm/s LVOT VTI:    0.227 m  AORTA Ao Root diam: 4.00  cm MITRAL VALVE MV Area (PHT): 2.20 cm     SHUNTS MV Decel Time: 345 msec     Systemic VTI:  0.23 m MV E velocity: 66.20 cm/s   Systemic Diam: 2.30 cm MV A velocity: 114.00 cm/s MV E/A ratio:  0.58 Rudean Haskell MD Electronically signed by Rudean Haskell MD Signature Date/Time: 09/11/2020/1:12:15 PM    Final    VAS [place] LOWER EXTREMITY VENOUS (DVT)  Result Date: 09/13/2020  Lower Venous DVT Study Indications: Swelling.  Risk Factors: COVID 19 positive. Comparison Study: No prior studies. Performing Technologist: Oliver Hum RVT  Examination Guidelines: A complete evaluation includes B-mode imaging, spectral Doppler, color Doppler, and power Doppler as needed of all accessible portions of each vessel. Bilateral testing is considered an integral part of a complete examination. Limited examinations for reoccurring indications may be performed as noted. The reflux portion of the exam is performed with the patient in reverse Trendelenburg.  +---------+---------------+---------+-----------+----------+--------------+ RIGHT    CompressibilityPhasicitySpontaneityPropertiesThrombus Aging +---------+---------------+---------+-----------+----------+--------------+ CFV      Full           Yes      Yes                                 +---------+---------------+---------+-----------+----------+--------------+ SFJ      Full                                                        +---------+---------------+---------+-----------+----------+--------------+ FV Prox  Full                                                        +---------+---------------+---------+-----------+----------+--------------+  FV Mid   Full                                                        +---------+---------------+---------+-----------+----------+--------------+ FV DistalFull                                                         +---------+---------------+---------+-----------+----------+--------------+ PFV      Full                                                        +---------+---------------+---------+-----------+----------+--------------+ POP      Full           Yes      Yes                                 +---------+---------------+---------+-----------+----------+--------------+ PTV      Full                                                        +---------+---------------+---------+-----------+----------+--------------+ PERO     Full                                                        +---------+---------------+---------+-----------+----------+--------------+   +---------+---------------+---------+-----------+----------+--------------+ LEFT     CompressibilityPhasicitySpontaneityPropertiesThrombus Aging +---------+---------------+---------+-----------+----------+--------------+ CFV      Full           Yes      Yes                                 +---------+---------------+---------+-----------+----------+--------------+ SFJ      Full                                                        +---------+---------------+---------+-----------+----------+--------------+ FV Prox  Full                                                        +---------+---------------+---------+-----------+----------+--------------+ FV Mid   Full                                                        +---------+---------------+---------+-----------+----------+--------------+  FV DistalFull                                                        +---------+---------------+---------+-----------+----------+--------------+ PFV      Full                                                        +---------+---------------+---------+-----------+----------+--------------+ POP      Full           Yes      Yes                                  +---------+---------------+---------+-----------+----------+--------------+ PTV      Full                                                        +---------+---------------+---------+-----------+----------+--------------+ PERO     Full                                                        +---------+---------------+---------+-----------+----------+--------------+     Summary: RIGHT: - There is no evidence of deep vein thrombosis in the lower extremity.  - No cystic structure found in the popliteal fossa.  LEFT: - There is no evidence of deep vein thrombosis in the lower extremity.  - No cystic structure found in the popliteal fossa.  *See table(s) above for measurements and observations. Electronically signed by Heath Lark on 09/13/2020 at 5:38:10 PM.    Final     Assessment/Plan  1. Acute respiratory failure with hypoxia (HCC) -Due to COVID-19 pneumonia, aspiration pneumonia and COPD exacerbation -  Now on O2 @ 2L/min via [place] -  On puree and nectar thick liquid, speech therapy to evaluate  2. Pneumonia due to COVID-19 virus -  Was treated with remdesivir, Baricitinib and Unasyn X 5 days  3. Atrial fibrillation with RVR (HCC) -Rate controlled, continue Eliquis for anticoagulation and metoprolol titrate for rate control  4. COPD exacerbation (HCC) -No wheezing, continue Brio Ellipta inhaler and PRN albuterol  5. Chronic diastolic CHF (congestive heart failure) (HCC) -  No SOB nor edema, stable  6. Acute renal failure with acute renal cortical necrosis superimposed on stage 3a chronic kidney disease (HCC) Lab Results  Component Value Date   NA 137 09/19/2020   K 4.6 09/19/2020   CO2 19 (L) 09/19/2020   GLUCOSE 129 (H) 09/19/2020   BUN 37 (H) 09/19/2020   CREATININE 1.55 (H) 09/19/2020   CALCIUM 8.2 (L) 09/19/2020   GFRNONAA 44 (L) 09/19/2020   GFRAA 59 (L) 04/27/2018   -  Will re-check BMP in 1 week  7. Diabetes mellitus type 2 with complications [facility]) Lab Results   Component Value Date   HGBA1C 6.1 (H) 09/11/2020   -  Continue Metformin and CBG checks  8. Coronary artery disease involving native coronary artery of native heart with angina pectoris (HCC) -No complaints of chest pains -Continue isosorbide mononitrate ER and simvastatin     Family/ staff Communication:   Discussed plan of care with resident and charge nurse.  Labs/tests ordered: None  Goals of care:   Short-term care   Durenda Age, DNP, MSN, FNP-BC [facility] [phone] (Monday-Friday 8:00 a.m. - 5:00 p.m.) [phone] (after hours)

## 2020-09-21 ENCOUNTER — Encounter: Payer: Self-pay | Admitting: Internal Medicine

## 2020-09-21 ENCOUNTER — Non-Acute Institutional Stay (SKILLED_NURSING_FACILITY): Payer: Medicare Other | Admitting: Internal Medicine

## 2020-09-21 DIAGNOSIS — J069 Acute upper respiratory infection, unspecified: Secondary | ICD-10-CM | POA: Diagnosis not present

## 2020-09-21 DIAGNOSIS — U071 COVID-19: Secondary | ICD-10-CM | POA: Diagnosis not present

## 2020-09-21 DIAGNOSIS — N179 Acute kidney failure, unspecified: Secondary | ICD-10-CM | POA: Diagnosis not present

## 2020-09-21 DIAGNOSIS — R1310 Dysphagia, unspecified: Secondary | ICD-10-CM

## 2020-09-21 DIAGNOSIS — I4891 Unspecified atrial fibrillation: Secondary | ICD-10-CM | POA: Diagnosis not present

## 2020-09-21 DIAGNOSIS — F0390 Unspecified dementia without behavioral disturbance: Secondary | ICD-10-CM

## 2020-09-21 NOTE — Patient Instructions (Signed)
See assessment and plan under each diagnosis in the problem list and acutely for this visit 

## 2020-09-21 NOTE — Assessment & Plan Note (Addendum)
09/20/2020 BIMS score 3 09/21/2020 he could not provide me with the date or describe why he had been hospitalized.  Although there may be a component of COVID related "mental fog" he appears to have vascular dementia based on the MRI findings as previously noted.

## 2020-09-21 NOTE — Assessment & Plan Note (Signed)
Respiratory status is stable; he exhibits the stigmata of advanced, end-stage COPD

## 2020-09-21 NOTE — Progress Notes (Signed)
NURSING HOME LOCATION:  [facility] ROOM NUMBER:  [id number]  CODE STATUS:  DNR PCP:  Eustaquio Boyden, MD   This is a comprehensive admission note to [facility] performed on this date less than 30 days from date of admission. Included are preadmission medical/surgical history; reconciled medication list; family history; social history and comprehensive review of systems.  Corrections and additions to the records were documented. Comprehensive physical exam was also performed. Additionally a clinical summary was entered for each active diagnosis pertinent to this admission in the Problem List to enhance continuity of care.  HPI: Patient was hospitalized 09/10/2020 - 09/19/2020 with acute hypoxic respiratory failure due to COVID-19 pneumonia complicated by aspiration pneumonia with COPD exacerbation.  COVID treatment including steroids beginning 12/26; remdesivir 12/26 - 09/14/2020; and baricitinib as per protocol.O2 was weaned to maintenance 2 L/min. Aspiration pneumonia was treated with Unasyn x5 days.  Dysphagia 1 diet with nectar thick fluids was initiated because of the high risk of aspiration.   Hospital course complicated by A. fib with RVR.  Rate was controlled with metoprolol and it Eliquis prophylaxis continued.  CHADS2-vas the score approximately 4. Course also complicated by AKI on CKD stage IIIa.  Creatinine plateaued in the 1.7-1.8 range; baseline was felt to be 1.2-1.4. He was discharged to the SNF with speech therapy follow-up and PT/OT for debilitation.  CODE STATUS was DNR.  Past medical and surgical history: Diagnoses include history of chronic diastolic heart failure, CAD, essential hypertension, dyslipidemia, diabetes with vascular complications, history of PSVT, BPH, GERD, history of gastric ulcer, history of Barrett's esophagus, and history of tobacco abuse. Surgeries and procedures include CABG and lumbar laminectomy/microdiscectomy.  Social history: He drinks  socially; he has a 60-pack-year history of smoking.  Family history: Noncontributory due to advanced age.   Review of systems: He could not give me the date and his reply to all answers was "I do not know".  His responses were brusque as if he were irritated by the queries.  He had difficulty comprehending why I had come to interview and examine him.  I did get him to engage slightly when I asked about his career prior to retirement.  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 BIMS score 01/05 by Speech Therapy was 3. Chart includes a diagnosis of dementia.  MMSE performed in November 2020 revealed a score of 24 out of 30.  Neurology had prescribed Namenda and Aricept according to Dr. Nicanor Alcon notes.  He was unable to tell me why he had been in the hospital.  He was unaware of the COVID infection, aspiration, or the A. fib. In fact he did not understand what A. fib was. He did deny any active cardiopulmonary or infectious symptoms.  Physical exam:  Pertinent or positive findings: He appears his age and somewhat chronically ill.  He has pattern alopecia.  He has a mustache.  The maxilla is edentulous.  Chest is barrel-shaped and lungs are quiet.  Heart sounds are distant and rhythm irregular.  Posterior tibial pulses are stronger than dorsalis pedis pulses which are reduced.  He has scattered ecchymoses, abrasions, and eschar over the extremities, especially upper extremities.  He exhibits atrophy especially of the lower extremities.  Strength could not be assessed well as he followed commands poorly.  When I placed my hand on the right thigh he lifted the left with same  pattern on left.  General appearance: no acute distress, increased work of breathing is present.   Lymphatic: No lymphadenopathy about the head, neck, axilla. Eyes: No conjunctival inflammation or lid edema is present. There is no scleral  icterus. Ears:  External ear exam shows no significant lesions or deformities.   Nose:  External nasal examination shows no deformity or inflammation. Nasal mucosa are pink and moist without lesions, exudates Neck:  No thyromegaly, masses, tenderness noted.    Heart:  No gallop, murmur, click, rub.  Lungs:  without wheezes, rhonchi, rales, rubs. Abdomen: Bowel sounds are normal.  Abdomen is soft and nontender with no organomegaly, hernias, masses. GU: Deferred  Extremities:  No cyanosis, clubbing, edema. Neurologic exam: Balance, Rhomberg, finger to nose testing could not be completed due to clinical state Skin: Warm & dry . No significant rash.  See clinical summary under each active problem in the Problem List with associated updated therapeutic plan

## 2020-09-21 NOTE — Assessment & Plan Note (Addendum)
Speech Therapy evaluation at [facility]

## 2020-09-21 NOTE — Assessment & Plan Note (Signed)
Rate adequately controlled 

## 2020-09-21 NOTE — Assessment & Plan Note (Signed)
Monitor BMET @ SNF

## 2020-09-26 ENCOUNTER — Encounter: Payer: Medicare Other | Admitting: Family Medicine

## 2020-09-28 ENCOUNTER — Encounter: Payer: Self-pay | Admitting: Adult Health

## 2020-09-28 ENCOUNTER — Non-Acute Institutional Stay (SKILLED_NURSING_FACILITY): Payer: Medicare Other | Admitting: Adult Health

## 2020-09-28 DIAGNOSIS — Z720 Tobacco use: Secondary | ICD-10-CM | POA: Diagnosis not present

## 2020-09-28 DIAGNOSIS — F5102 Adjustment insomnia: Secondary | ICD-10-CM

## 2020-09-28 DIAGNOSIS — F0390 Unspecified dementia without behavioral disturbance: Secondary | ICD-10-CM

## 2020-09-28 NOTE — Progress Notes (Addendum)
Location:  [facility] Room Number: 307 A Place of Service:  SNF (31) Provider:  Durenda Age, DNP, FNP-BC  Patient Care Team: [name], MD as PCP - General  Extended Emergency Contact Information Primary Emergency Contact: [place] Phone: [phone] Mobile Phone: [phone] Relation: Son Secondary Emergency Contact: Dorothey Baseman Mobile Phone: [phone] Relation: Daughter  Code Status:  DNR  Goals of care: Advanced Directive information Advanced Directives 09/28/2020  Does Patient Have a Medical Advance Directive? Yes  Type of Paramedic of [place];Living will;Out of facility DNR (pink MOST or yellow form)  Does patient want to make changes to medical advance directive? No - Patient declined  Copy of [place] in Chart? Yes - validated most recent copy scanned in chart (See row information)  Would patient like information on creating a medical advance directive? -  Pre-existing out of facility DNR order (yellow form or pink MOST form) -     Chief Complaint  Patient presents with   Acute Visit    Tobacco use and not sleeping     HPI:  Pt is a 87 y.o. male seen today for an acute visit. He is a short-term care resident of [facility]. He has a PMH ofCOPD, CAD S/P CABG, hypertension, hyperlipidemia, type 2 diabetes mellitus, hyperlipidemia and tobacco use. He was reported not sleeping at night and yells for help. Son reported that he smokes and has 60 pack years. He was seen in his room today. He continues to use O2 @ 2L/min via [place]. He was admitted to [facility] on 09/19/20 post hospitalization 09/10/20 to 09/19/20 for COVID-19 pneumonia. He was treated with Remdesivir, Baricitinib and completed 5 days of Unasyn.  Past Medical History:  Diagnosis Date   Aorto-iliac atherosclerosis ([place]) 08/2015   by xray   Barrett's esophagus     EGD 2012, rpt 2014, longterm PPI   Chronic idiopathic constipation 07/17/2010   Chronic obstructive pulmonary disease (COPD) ([place])    spirometry with moderate COPD   Coronary atherosclerosis of artery bypass graft    Diabetes mellitus type II ~2008   Gastrointestinal ulcer 04/2011   with hematemesis s/p EGD by [facility]   GERD (gastroesophageal reflux disease)    Hearing loss    HLD (hyperlipidemia)    HNP (herniated nucleus pulposus), lumbar 2013   with compression of L5 nerve root and foot drop, also with lumbar DDD s/p surgery   Hypertrophy of prostate without urinary obstruction and other lower urinary tract symptoms (LUTS)    Memory loss    Paroxysmal SVT (supraventricular tachycardia) ([place])    a. 3/14 => converted in ED with adenosine to AFib => NSR;  b. Echo 4/14:  Mild LVH, mild FBSH, EF 55-60%, Gr 1 DD   Prostate nodule ~2009   Left-benign s/p eval Uro WNL ([place])   Thrombocytopenia (H[facility])    Tobacco use disorder    Past Surgical History:  Procedure Laterality Date   COLONOSCOPY  03/2009   small hemorrhoids, 2 polyps (hyperplastic and adenomatous), rpt 5 yrs (Dr. Barron Schmid) - pt declined rpt scheduling   COLONOSCOPY  10/2014   2 hyperplastic polyp, hemmorhoids ([place])   CORONARY ARTERY BYPASS GRAFT  1999   Dr. Roxy Manns   ESOPHAGOGASTRODUODENOSCOPY  05/16/2011   after hematemesis, no lesions found, consistent with barrett's esophagus rpt 2 yrs   ESOPHAGOGASTRODUODENOSCOPY  06/2013   barrett's, small HH, recheck 2-3 yrs ([place])  HEMORRHOID SURGERY     LUMBAR LAMINECTOMY/DECOMPRESSION MICRODISCECTOMY  01/31/2012   Procedure: LUMBAR LAMINECTOMY/DECOMPRESSION MICRODISCECTOMY 1 LEVEL;  Surgeon: Winfield Cunas, MD;  Location: [place] NEURO ORS;  Service: Neurosurgery;  Laterality: Left;  LEFT Lumbar Four-Five Diskectomy   NOSE SURGERY     ROTATOR CUFF REPAIR  04/2010   right (but has bilat) Dr. Latanya Maudlin ([facility])    Allergies  Allergen Reactions    Codeine     REACTION: vomiting   Cyclobenzaprine Hcl     REACTION: vomiting   Hydrocodone Nausea Only and Other (See Comments)    "crazy"   Tramadol Other (See Comments)    woozy   Valium Other (See Comments)    sedation    Outpatient Encounter Medications as of 09/28/2020  Medication Sig   albuterol (VENTOLIN HFA) 108 (90 Base) MCG/ACT inhaler Inhale 2 puffs into the lungs every 6 (six) hours as needed for wheezing or shortness of breath.   apixaban (ELIQUIS) 2.5 MG TABS tablet Take 1 tablet (2.5 mg total) by mouth 2 (two) times daily.   BREO ELLIPTA 100-25 MCG/INH AEPB USE 1 INHALATION BY MOUTH  INTO THE LUNGS DAILY   donepezil (ARICEPT) 10 MG tablet Take 1 tablet (10 mg total) by mouth at bedtime.   isosorbide mononitrate (IMDUR) 30 MG 24 hr tablet Take 1 tablet (30 mg total) by mouth daily.   magnesium hydroxide (MILK OF MAGNESIA) 400 MG/5ML suspension Take 15 mLs by mouth daily as needed for moderate constipation.   memantine (NAMENDA) 10 MG tablet Take 1 tablet (10 mg total) by mouth 2 (two) times daily.   metFORMIN (GLUCOPHAGE) 500 MG tablet Take 1 tablet (500 mg total) by mouth daily with breakfast.   metoprolol tartrate (LOPRESSOR) 25 MG tablet TAKE ONE-HALF TABLET BY  MOUTH TWICE DAILY   omeprazole (PRILOSEC) 40 MG capsule TAKE 1 CAPSULE BY MOUTH  DAILY   sertraline (ZOLOFT) 50 MG tablet TAKE 1 TABLET BY MOUTH  DAILY   simvastatin (ZOCOR) 40 MG tablet TAKE 1 TABLET BY MOUTH AT  BEDTIME   tamsulosin (FLOMAX) 0.4 MG CAPS capsule Take 1 capsule (0.4 mg total) by mouth daily.   vitamin B-12 (CYANOCOBALAMIN) 500 MCG tablet Take 1 tablet (500 mcg total) by mouth every Monday, Wednesday, and Friday.   No facility-administered encounter medications on file as of 09/28/2020.    Review of Systems  Unable to obtain due to dementia    Immunization History  Administered Date(s) Administered   Fluad Quad(high Dose 65+) 08/02/2019   Influenza Whole 06/20/2010    Influenza, High Dose Seasonal PF 06/02/2015, 07/04/2017, 06/15/2018   Influenza, Seasonal, Injecte, Preservative Fre 06/16/2014   Influenza-Unspecified 07/17/2016   Pneumococcal Polysaccharide-23 09/17/2011   Td 07/01/1999, 08/14/2010   Pertinent  Health Maintenance Due  Topic Date Due   OPHTHALMOLOGY EXAM  06/16/2012   COLONOSCOPY (Pts 45-66yrs Insurance coverage will need to be confirmed)  11/10/2019   INFLUENZA VACCINE  04/16/2020   URINE MICROALBUMIN  07/25/2020   PNA vac Low Risk Adult (2 of 2 - PCV13) 12/14/2025 (Originally 09/16/2012)   FOOT EXAM  01/23/2021   HEMOGLOBIN A1C  03/12/2021   Fall Risk  04/10/2020 08/02/2019 07/07/2018 10/15/2017 10/01/2016  Falls in the past year? 0 1 No No No  Number falls in past yr: - 0 - - -  Injury with Fall? - 0 - - -     Vitals:   09/28/20 1645  BP: 127/66  Pulse: 88  Resp: 17  Temp: 98.4 F (36.9 C)  Weight: 154 lb 6.4 oz (70 kg)  Height: 5\' 8"  (1.727 m)   Body mass index is 23.48 kg/m.  Physical Exam  GENERAL APPEARANCE: Well nourished. In no acute distress. Normal body habitus SKIN:  Skin is warm and dry.  MOUTH and THROAT: Lips are without lesions. Oral mucosa is moist and without lesions. Tongue is normal in shape, size, and color and without lesions RESPIRATORY: Breathing is even & unlabored, O2 @ 2L/min via [place] CARDIAC: RRR, no murmur,no extra heart sounds, no edema GI: Abdomen soft, normal BS, no masses, no tenderness EXTREMITIES:  Able to move X 4 extremities NEUROLOGICAL: There is no tremor. Speech is clear. Alert to self, disoriented to time and place. PSYCHIATRIC:  Affect and behavior are appropriate  Labs reviewed: Recent Labs    09/11/20 0913 09/11/20 1035 09/12/20 0110 09/13/20 0103 09/14/20 0049 09/15/20 0103 09/16/20 [id number] 09/16/20 0945 09/17/20 0113 09/18/20 0301 09/19/20 0114  NA  --    < > 142 140 141 145  --    < > 137 140 137  K  --    < > 4.8 4.9 4.8 5.1  --    < > 4.6 4.6 4.6  CL   --    < > 112* 114* 112* 116*  --    < > 111 110 111  CO2  --    < > 20* 16* 17* 18*  --    < > 17* 20* 19*  GLUCOSE  --    < > 117* 113* 113* 138*  --    < > 128* 127* 129*  BUN  --    < > 61* 51* 60* 63*  --    < > 42* 41* 37*  CREATININE  --    < > 1.99* 1.57* 1.85* 1.88*  --    < > 1.54* 1.65* 1.55*  CALCIUM  --    < > 8.5* 8.4* 8.6* 8.7*  --    < > 8.1* 8.3* 8.2*  MG 2.1  --  2.1 2.1 2.2 2.3 2.1  --   --   --   --   PHOS 4.1  --  3.1 2.4*  --   --   --   --   --   --   --    < > = values in this interval not displayed.   Recent Labs    09/17/20 0113 09/18/20 0301 09/19/20 0114  AST 32 29 25  ALT 37 38 35  ALKPHOS 80 94 88  BILITOT 0.8 0.9 1.0  PROT 5.6* 5.8* 5.6*  ALBUMIN 2.4* 2.4* 2.3*   Recent Labs    09/13/20 0103 09/14/20 0049 09/15/20 0103 09/16/20 0733 09/17/20 0113 09/18/20 0301 09/19/20 0114  WBC 7.6 7.8 7.4   < > 10.3 11.3* 11.9*  NEUTROABS 6.6 6.6 6.2  --   --   --   --   HGB 12.1* 12.0* 11.6*   < > 9.9* 11.0* 10.5*  HCT 36.5* 36.7* 35.1*   < > 29.5* 33.6* 31.4*  MCV 92.2 92.4 91.4   < > 89.7 92.1 91.5  PLT 85* 107* 104*   < > 142* 171 196   < > = values in this interval not displayed.   Lab Results  Component Value Date   TSH 3.56 07/26/2019   Lab Results  Component Value Date   HGBA1C 6.1 (H) 09/11/2020   Lab Results  Component Value Date  CHOL 141 07/26/2019   HDL 35.80 (L) 07/26/2019   LDLCALC 82 07/26/2019   LDLDIRECT 74.0 08/25/2015   TRIG 118.0 07/26/2019   CHOLHDL 4 07/26/2019    Significant Diagnostic Results in last 30 days:  DG Chest 1 View  Result Date: 09/10/2020 CLINICAL DATA:  Shortness of breath EXAM: CHEST  1 VIEW COMPARISON:  06/30/2018 FINDINGS: Cardiac shadow is within normal limits. Postsurgical changes are seen. The lungs are hyperinflated. No focal infiltrate or sizable effusion is seen. No bony abnormality is noted. IMPRESSION: COPD without acute abnormality. Electronically Signed   By: Inez Catalina [name]   On:  09/10/2020 13:45   CT Head Wo Contrast  Result Date: 09/10/2020 CLINICAL DATA:  Recent head trauma, initial encounter EXAM: CT HEAD WITHOUT CONTRAST TECHNIQUE: Contiguous axial images were obtained from the base of the skull through the vertex without intravenous contrast. COMPARISON:  None. FINDINGS: Brain: No evidence of acute infarction, hemorrhage, hydrocephalus, extra-axial collection or mass lesion/mass effect. Chronic atrophic and ischemic changes are noted. Vascular: No hyperdense vessel or unexpected calcification. Skull: Normal. Negative for fracture or focal lesion. Sinuses/Orbits: No acute finding. Other: None. IMPRESSION: Chronic atrophic and ischemic changes without acute abnormality. Electronically Signed   By: Inez Catalina [name]   On: 09/10/2020 14:28   CT CHEST WO CONTRAST  Result Date: 09/10/2020 CLINICAL DATA:  Respiratory failure.  COVID-19 positive.  Hypoxia. EXAM: CT CHEST WITHOUT CONTRAST TECHNIQUE: Multidetector CT imaging of the chest was performed following the standard protocol without IV contrast. COMPARISON:  None. FINDINGS: Cardiovascular: Heart is normal size. Patient is post median sternotomy/CABG. Mild ectasia of the ascending thoracic aorta measuring 3.7 cm. There is calcified plaque throughout the thoracic aorta. Remaining vascular structures are unremarkable. Mediastinum/Nodes: No mediastinal or hilar adenopathy. Remaining mediastinal structures are normal. Lungs/Pleura: Lungs are hyperexpanded with centrilobular emphysematous disease. Biapical pleural thickening right worse than left. There is a 1 cm nodule opacity over the posteromedial right lower lobe with subtle adjacent possible hazy nodular opacification 7-8 mm nodule over the more superior aspect of the left lower lobe. Mild linear density with focal nodularity over the right middle lobe medially. No effusion. Airways are normal. Upper Abdomen: Calcified plaque over the abdominal aorta. No acute findings  Musculoskeletal: Degenerative change of the spine. IMPRESSION: 1. Emphysematous disease with biapical pleural thickening right worse than left. 1 cm nodule opacity over the posteromedial right lower lobe with subtle adjacent hazy nodular opacification. 7-8 mm nodule over the more superior aspect of the left lower lobe. Findings may be seen with atypical infection versus inflammatory process and less likely underlying neoplastic/metastatic disease. Recommend follow-up CT 6-8 weeks. 2. Emphysema and aortic atherosclerosis. Mild dilatation of the ascending thoracic aorta measuring 3.7 cm. Recommend annual imaging followup by CTA or MRA. This recommendation follows 2010 ACCF/AHA/AATS/ACR/ASA/SCA/SCAI/SIR/STS/SVM Guidelines for the Diagnosis and Management of Patients with Thoracic Aortic Disease. Circulation.2010; 121: S010-X323. Aortic aneurysm NOS (ICD10-I71.9). Aortic Atherosclerosis (ICD10-I70.0) and Emphysema (ICD10-J43.9). Electronically Signed   By: Marin Olp [name]   On: 09/10/2020 18:08   CT Cervical Spine Wo Contrast  Result Date: 09/10/2020 CLINICAL DATA:  Recent trauma with neck pain, initial encounter EXAM: CT CERVICAL SPINE WITHOUT CONTRAST TECHNIQUE: Multidetector CT imaging of the cervical spine was performed without intravenous contrast. Multiplanar CT image reconstructions were also generated. COMPARISON:  None. FINDINGS: Alignment: Within normal limits. Skull base and vertebrae: 7 cervical segments are well visualized. Vertebral body height is well maintained. Multilevel osteophytic changes and facet hypertrophic changes are  seen. No acute fracture or acute facet abnormality is noted. The odontoid is within normal limits. Soft tissues and spinal canal: Surrounding soft tissue structures are within normal limits with the exception of vascular calcifications. Upper chest: Pleural and parenchymal scarring is noted in the apices bilaterally. Other: None IMPRESSION: Multilevel degenerative change  without acute abnormality. Electronically Signed   By: Inez Catalina [name]   On: 09/10/2020 14:32   DG Chest Port 1 View  Result Date: 09/19/2020 CLINICAL DATA:  Shortness of breath.  COVID. EXAM: PORTABLE CHEST 1 VIEW COMPARISON:  09/13/2020.  06/30/2018.  CT 09/10/2020. FINDINGS: Prior CABG. Heart size normal. Bilateral interstitial prominence again noted. Pneumonitis could present this fashion. Slight interim improvement from prior exam. No pleural effusion or pneumothorax. Reference is made to prior CT report of 09/10/2020 for discussion of pulmonary nodule present. No acute bony abnormality. IMPRESSION: 1. Prior CABG. Heart size normal. 2. Bilateral interstitial prominence again noted. Pneumonitis could present in this fashion. Slight interim improvement from prior exam. 3. Reference is made to prior CT report of 09/10/2020 for discussion of pulmonary nodule present. Electronically Signed   By: Marcello Moores  Register   On: 09/19/2020 08:45   DG Chest Port 1 View  Result Date: 09/13/2020 CLINICAL DATA:  Shortness of breath.  Reported COVID-19 positive EXAM: PORTABLE CHEST 1 VIEW COMPARISON:  Chest radiograph and chest CT September 10, 2020 FINDINGS: There are areas of underlying fibrotic change. There is ill-defined airspace opacity superimposed on apparent fibrotic change in the lower lobe regions. No consolidation. The heart size is normal. The pulmonary vascularity is stable and appears consistent with underlying emphysematous change. Patient is status post coronary artery bypass grafting. No adenopathy. There is aortic atherosclerosis. No appreciable bone lesions. IMPRESSION: The appearance is felt to be indicative of ill-defined airspace opacity in the lower lung regions superimposed on underlying fibrotic type change. Underlying emphysematous change noted with decreased vascularity in the upper lobes, stable. Stable cardiac silhouette. Status post coronary artery bypass grafting. Aortic Atherosclerosis  (ICD10-I70.0) and Emphysema (ICD10-J43.9). Electronically Signed   By: Lowella Grip III [name]   On: 09/13/2020 08:46   DG Swallowing Func-Speech Pathology  Result Date: 09/14/2020 Objective Swallowing Evaluation: Type of Study: MBS-Modified Barium Swallow Study  Patient Details Name: HELMER SLOVER MRN: [id number] Date of Birth: 1933/01/09 Today's Date: 09/14/2020 Time: SLP Start Time (ACUTE ONLY): 44 -SLP Stop Time (ACUTE ONLY): [id number] SLP Time Calculation (min) (ACUTE ONLY): 14 min Past Medical History: Past Medical History: Diagnosis Date  Aorto-iliac atherosclerosis ([place]) 08/2015  by xray  Barrett's esophagus   EGD 2012, rpt 2014, longterm PPI  Chronic idiopathic constipation 07/17/2010  Chronic obstructive pulmonary disease (COPD) ([place])   spirometry with moderate COPD  Coronary atherosclerosis of artery bypass graft   Diabetes mellitus type II ~2008  Gastrointestinal ulcer 04/2011  with hematemesis s/p EGD by [facility]  GERD (gastroesophageal reflux disease)   Hearing loss   HLD (hyperlipidemia)   HNP (herniated nucleus pulposus), lumbar 2013  with compression of L5 nerve root and foot drop, also with lumbar DDD s/p surgery  Hypertrophy of prostate without urinary obstruction and other lower urinary tract symptoms (LUTS)   Memory loss   Paroxysmal SVT (supraventricular tachycardia) ([place])   a. 3/14 => converted in ED with adenosine to AFib => NSR;  b. Echo 4/14:  Mild LVH, mild FBSH, EF 55-60%, Gr 1 DD  Prostate nodule ~2009  Left-benign s/p eval Uro WNL ([place])  Thrombocytopenia ([place])   Tobacco use  disorder  Past Surgical History: Past Surgical History: Procedure Laterality Date  COLONOSCOPY  03/2009  small hemorrhoids, 2 polyps (hyperplastic and adenomatous), rpt 5 yrs (Dr. Barron Schmid) - pt declined rpt scheduling  COLONOSCOPY  10/2014  2 hyperplastic polyp, hemmorhoids ([place])  CORONARY ARTERY BYPASS GRAFT  1999  Dr. Roxy Manns  ESOPHAGOGASTRODUODENOSCOPY  05/16/2011  after hematemesis, no  lesions found, consistent with barrett's esophagus rpt 2 yrs  ESOPHAGOGASTRODUODENOSCOPY  06/2013  barrett's, small HH, recheck 2-3 yrs ([facility])  HEMORRHOID SURGERY    LUMBAR LAMINECTOMY/DECOMPRESSION MICRODISCECTOMY  01/31/2012  Procedure: LUMBAR LAMINECTOMY/DECOMPRESSION MICRODISCECTOMY 1 LEVEL;  Surgeon: Winfield Cunas, MD;  Location: [place] [facility];  Service: Neurosurgery;  Laterality: Left;  LEFT Lumbar Four-Five Diskectomy  NOSE SURGERY    ROTATOR CUFF REPAIR  04/2010  right (but has bilat) Dr. Latanya Maudlin ([facility]) HPI: Pt is an 87 y.o. male with medical history significant of COPD, CAD status post CABG, HTN, HLD, dementia, DM who presented the emergency department with reported fall and shortness of breath. Pt found to have acute hypoxic respiratory failure due to COVID-19 pneumonia. CXR 12/29: Ill-defined airspace opacity in the lower lung region superimposed on underlying fibrosis. Per referring MD's note, pt appeared "to be accumulating some secretions and wheezing/gurgling at times." and SLP was consulted "to make sure no overt aspiration".  No data recorded Assessment / Plan / Recommendation CHL IP CLINICAL IMPRESSIONS 09/14/2020 Clinical Impression Pt presents with oropharyngeal dysphagia characterized by reduced bolus propulsion, reduced lingual retraction, and a a pharyngeal delay. He demonstrated base of tongue residue, vallecular residue, and pyriform sinus reidue which were reduced with secondary swallows. Greater than 50% of thin liquid boluses was frequently propelled to the pyriform sinuses prior to any attempt at airway closure. Penetration and aspiration (PAS 5, 7) were demonstrated with thin liquids and resulted in weak, ineffective coughing. Penetration (PAS 3) was noted with nectar thick liquids via straw, but no via cup. No functional benefit was noted with a chin tuck posture. Additional trials and compensatory strategy attempts were refused by the pt who stated the he was tired and  would not participate further. A dysphagia 2 diet with nectar thick liquids via cup is recommended at this time. SLP will follow for dysphagia treatment. SLP Visit Diagnosis Dysphagia, unspecified (R13.10) Attention and concentration deficit following -- Frontal lobe and executive function deficit following -- Impact on safety and function Moderate aspiration risk   CHL IP TREATMENT RECOMMENDATION 09/14/2020 Treatment Recommendations Therapy as outlined in treatment plan below   Prognosis 09/14/2020 Prognosis for Safe Diet Advancement Good Barriers to Reach Goals Severity of deficits Barriers/Prognosis Comment -- CHL IP DIET RECOMMENDATION 09/14/2020 SLP Diet Recommendations Dysphagia 2 (Fine chop) solids;Nectar thick liquid Liquid Administration via Cup;No straw Medication Administration Whole meds with puree Compensations Slow rate;Small sips/bites Postural Changes Seated upright at 90 degrees;Remain semi-upright after after feeds/meals (Comment)   CHL IP OTHER RECOMMENDATIONS 09/14/2020 Recommended Consults -- Oral Care Recommendations Oral care BID Other Recommendations Order thickener from pharmacy   CHL IP FOLLOW UP RECOMMENDATIONS 09/14/2020 Follow up Recommendations (No Data)   CHL IP FREQUENCY AND DURATION 09/14/2020 Speech Therapy Frequency (ACUTE ONLY) min 2x/week Treatment Duration 2 weeks      CHL IP ORAL PHASE 09/14/2020 Oral Phase Impaired Oral - Pudding Teaspoon -- Oral - Pudding Cup -- Oral - Honey Teaspoon -- Oral - Honey Cup Reduced posterior propulsion Oral - Nectar Teaspoon Premature spillage;Decreased bolus cohesion Oral - Nectar Cup Premature spillage;Decreased bolus cohesion Oral -  Nectar Straw Premature spillage;Decreased bolus cohesion Oral - Thin Teaspoon -- Oral - Thin Cup Premature spillage;Decreased bolus cohesion Oral - Thin Straw Premature spillage;Decreased bolus cohesion Oral - Puree Reduced posterior propulsion Oral - Mech Soft Reduced posterior propulsion Oral - Regular Reduced  posterior propulsion Oral - Multi-Consistency -- Oral - Pill -- Oral Phase - Comment --  CHL IP PHARYNGEAL PHASE 09/14/2020 Pharyngeal Phase Impaired Pharyngeal- Pudding Teaspoon -- Pharyngeal -- Pharyngeal- Pudding Cup -- Pharyngeal -- Pharyngeal- Honey Teaspoon -- Pharyngeal -- Pharyngeal- Honey Cup Pharyngeal residue - valleculae;Pharyngeal residue - pyriform;Delayed swallow initiation-vallecula;Reduced tongue base retraction Pharyngeal -- Pharyngeal- Nectar Teaspoon -- Pharyngeal -- Pharyngeal- Nectar Cup Pharyngeal residue - valleculae;Pharyngeal residue - pyriform;Delayed swallow initiation-vallecula;Reduced tongue base retraction Pharyngeal Material does not enter airway Pharyngeal- Nectar Straw Pharyngeal residue - valleculae;Pharyngeal residue - pyriform;Delayed swallow initiation-vallecula;Reduced tongue base retraction Pharyngeal Material enters airway, remains ABOVE vocal cords and not ejected out Pharyngeal- Thin Teaspoon -- Pharyngeal -- Pharyngeal- Thin Cup Pharyngeal residue - valleculae;Pharyngeal residue - pyriform;Delayed swallow initiation-vallecula;Reduced tongue base retraction Pharyngeal Material enters airway, passes BELOW cords and not ejected out despite cough attempt by patient Pharyngeal- Thin Straw Pharyngeal residue - valleculae;Pharyngeal residue - pyriform;Delayed swallow initiation-vallecula;Reduced tongue base retraction Pharyngeal Material enters airway, passes BELOW cords and not ejected out despite cough attempt by patient;Material enters airway, remains ABOVE vocal cords and not ejected out Pharyngeal- Puree Pharyngeal residue - valleculae;Pharyngeal residue - pyriform;Delayed swallow initiation-vallecula;Reduced tongue base retraction Pharyngeal -- Pharyngeal- Mechanical Soft Pharyngeal residue - valleculae;Pharyngeal residue - pyriform;Delayed swallow initiation-vallecula;Reduced tongue base retraction Pharyngeal -- Pharyngeal- Regular Pharyngeal residue -  valleculae;Pharyngeal residue - pyriform;Delayed swallow initiation-vallecula;Reduced tongue base retraction Pharyngeal -- Pharyngeal- Multi-consistency NT Pharyngeal -- Pharyngeal- Pill NT Pharyngeal -- Pharyngeal Comment --  CHL IP CERVICAL ESOPHAGEAL PHASE 09/14/2020 Cervical Esophageal Phase WFL Pudding Teaspoon -- Pudding Cup -- Honey Teaspoon -- Honey Cup -- Nectar Teaspoon -- Nectar Cup -- Nectar Straw -- Thin Teaspoon -- Thin Cup -- Thin Straw -- Puree -- Mechanical Soft -- Regular -- Multi-consistency -- Pill -- Cervical Esophageal Comment -- Shanika I. Hardin Negus, [place], [place] Office number [phone] Pager [phone] Horton Marshall 09/14/2020, 4:14 PM              ECHOCARDIOGRAM COMPLETE  Result Date: 09/11/2020    ECHOCARDIOGRAM REPORT   Patient Name:   [name] Date of Exam: 09/11/2020 Medical Rec #:  [id number]        Height:       68.0 in Accession #:    [id number]       Weight:       155.0 lb Date of Birth:  [birth year]       BSA:          1.834 m Patient Age:    27 years         BP:           135/71 mmHg Patient Gender: M                HR:           77 bpm. Exam Location:  Inpatient Procedure: 2D Echo, Cardiac Doppler and Color Doppler Indications:    I50.40* Unspecified combined systolic (congestive) and diastolic                 (congestive) heart failure  History:        Patient has prior history of Echocardiogram examinations, most  recent 01/02/2013. CHF, CAD, Abnormal ECG, COPD, Arrythmias:SVT,                 Signs/Symptoms:Shortness of Breath, Dyspnea, Altered Mental                 Status and Alzheimer's; Risk Factors:Dyslipidemia. Covid                 positive.  Sonographer:    Roseanna Rainbow RDCS Referring Phys: [place] Comments: Technically difficult study due to poor echo windows. Patient could not turn. Study interrupted to answer phone for endo. IMPRESSIONS  1. Left ventricular ejection fraction, by  estimation, is 55 to 60%. The left ventricle has normal function. Abnormal sepal motion, no other regional wall motion abnormalities noted. There is mild concentric left ventricular hypertrophy. Left ventricular  diastolic parameters are indeterminate.  2. Right ventricular systolic function is normal. The right ventricular size is normal. Mildly increased right ventricular wall thickness.  3. The mitral valve is grossly normal. No evidence of mitral valve regurgitation.  4. The aortic valve was not well visualized. There is mild calcification of the aortic valve. Aortic valve regurgitation is not visualized. No aortic stenosis is present.  5. Aortic dilatation noted. There is mild dilatation of the aortic root, measuring 40 mm.  6. The inferior vena cava is normal in size with greater than 50% respiratory variability, suggesting right atrial pressure of 3 mmHg. Comparison(s): A prior study was performed on 12/23/2012. Prior images reviewed side by side. Compared to prior, increase in aortic root size. FINDINGS  Left Ventricle: Left ventricular ejection fraction, by estimation, is 55 to 60%. The left ventricle has normal function. The left ventricle has no regional wall motion abnormalities. The left ventricular internal cavity size was normal in size. There is  mild concentric left ventricular hypertrophy. Left ventricular diastolic parameters are indeterminate. Right Ventricle: The right ventricular size is normal. Mildly increased right ventricular wall thickness. Right ventricular systolic function is normal. Left Atrium: Left atrial size was normal in size. Right Atrium: Right atrial size was normal in size. Pericardium: The pericardium was not well visualized. Mitral Valve: The mitral valve is grossly normal. No evidence of mitral valve regurgitation. Tricuspid Valve: The tricuspid valve is grossly normal. Tricuspid valve regurgitation is not demonstrated. Aortic Valve: The aortic valve was not well visualized.  There is mild calcification of the aortic valve. Aortic valve regurgitation is not visualized. No aortic stenosis is present. Pulmonic Valve: The pulmonic valve was not well visualized. Pulmonic valve regurgitation is not visualized. Aorta: Aortic dilatation noted. There is mild dilatation of the aortic root, measuring 40 mm. Venous: The inferior vena cava is normal in size with greater than 50% respiratory variability, suggesting right atrial pressure of 3 mmHg. IAS/Shunts: The atrial septum is grossly normal.  LEFT VENTRICLE PLAX 2D LVIDd:         4.05 cm     Diastology LVIDs:         2.63 cm     LV e' medial:    7.29 cm/s LV PW:         1.10 cm     LV E/e' medial:  9.1 LV IVS:        1.10 cm     LV e' lateral:   8.27 cm/s LVOT diam:     2.30 cm     LV E/e' lateral: 8.0 LV SV:         94  LV SV Index:   51 LVOT Area:     4.15 cm  LV Volumes (MOD) LV vol d, MOD A2C: 32.1 ml LV vol d, MOD A4C: 54.6 ml LV vol s, MOD A2C: 10.3 ml LV vol s, MOD A4C: 12.4 ml LV SV MOD A2C:     21.8 ml LV SV MOD A4C:     54.6 ml LV SV MOD BP:      32.1 ml RIGHT VENTRICLE             IVC RV S prime:     14.50 cm/s  IVC diam: 1.80 cm TAPSE (M-mode): 1.7 cm LEFT ATRIUM           Index      RIGHT ATRIUM          Index LA diam:      2.40 cm 1.31 cm/m RA Area:     9.59 cm LA Vol (A2C): 14.7 ml 8.02 ml/m RA Volume:   18.60 ml 10.14 ml/m LA Vol (A4C): 8.7 ml  4.72 ml/m  AORTIC VALVE LVOT Vmax:   116.00 cm/s LVOT Vmean:  70.400 cm/s LVOT VTI:    0.227 m  AORTA Ao Root diam: 4.00 cm MITRAL VALVE MV Area (PHT): 2.20 cm     SHUNTS MV Decel Time: 345 msec     Systemic VTI:  0.23 m MV E velocity: 66.20 cm/s   Systemic Diam: 2.30 cm MV A velocity: 114.00 cm/s MV E/A ratio:  0.58 Rudean Haskell MD Electronically signed by Rudean Haskell MD Signature Date/Time: 09/11/2020/1:12:15 PM    Final    VAS [place] LOWER EXTREMITY VENOUS (DVT)  Result Date: 09/13/2020  Lower Venous DVT Study Indications: Swelling.  Risk Factors: COVID 19  positive. Comparison Study: No prior studies. Performing Technologist: Oliver Hum RVT  Examination Guidelines: A complete evaluation includes B-mode imaging, spectral Doppler, color Doppler, and power Doppler as needed of all accessible portions of each vessel. Bilateral testing is considered an integral part of a complete examination. Limited examinations for reoccurring indications may be performed as noted. The reflux portion of the exam is performed with the patient in reverse Trendelenburg.  +---------+---------------+---------+-----------+----------+--------------+  RIGHT     Compressibility Phasicity Spontaneity Properties Thrombus Aging  +---------+---------------+---------+-----------+----------+--------------+  CFV       Full            Yes       Yes                                    +---------+---------------+---------+-----------+----------+--------------+  SFJ       Full                                                             +---------+---------------+---------+-----------+----------+--------------+  FV Prox   Full                                                             +---------+---------------+---------+-----------+----------+--------------+  FV Mid    Full                                                             +---------+---------------+---------+-----------+----------+--------------+  FV Distal Full                                                             +---------+---------------+---------+-----------+----------+--------------+  PFV       Full                                                             +---------+---------------+---------+-----------+----------+--------------+  POP       Full            Yes       Yes                                    +---------+---------------+---------+-----------+----------+--------------+  PTV       Full                                                              +---------+---------------+---------+-----------+----------+--------------+  PERO      Full                                                             +---------+---------------+---------+-----------+----------+--------------+   +---------+---------------+---------+-----------+----------+--------------+  LEFT      Compressibility Phasicity Spontaneity Properties Thrombus Aging  +---------+---------------+---------+-----------+----------+--------------+  CFV       Full            Yes       Yes                                    +---------+---------------+---------+-----------+----------+--------------+  SFJ       Full                                                             +---------+---------------+---------+-----------+----------+--------------+  FV Prox   Full                                                             +---------+---------------+---------+-----------+----------+--------------+  FV Mid    Full                                                             +---------+---------------+---------+-----------+----------+--------------+  FV Distal Full                                                             +---------+---------------+---------+-----------+----------+--------------+  PFV       Full                                                             +---------+---------------+---------+-----------+----------+--------------+  POP       Full            Yes       Yes                                    +---------+---------------+---------+-----------+----------+--------------+  PTV       Full                                                             +---------+---------------+---------+-----------+----------+--------------+  PERO      Full                                                             +---------+---------------+---------+-----------+----------+--------------+     Summary: RIGHT: - There is no evidence of deep vein thrombosis in the lower extremity.  - No cystic structure found in  the popliteal fossa.  LEFT: - There is no evidence of deep vein thrombosis in the lower extremity.  - No cystic structure found in the popliteal fossa.  *See table(s) above for measurements and observations. Electronically signed by Jamelle Haring on 09/13/2020 at 5:38:10 PM.    Final     Assessment/Plan  1. Tobacco use -  Will start Nicotine 21 mg/24H patch daily   2. Adjustment insomnia -  Will start Melatonin 5 mg at bedtime -  Staff to assist resident when needed  3. Dementia without behavioral disturbance, unspecified dementia type ([place]) -  Continue Donepezil and supportive care    Family/ staff Communication: Discussed plan of care with charge nurse.  Labs/tests ordered: None  Goals of care:   Short-term care   Durenda Age, DNP, MSN, FNP-BC [facility] [phone] (Monday-Friday 8:00 a.m. - 5:00 p.m.) [phone] (after hours)

## 2020-10-06 ENCOUNTER — Non-Acute Institutional Stay (SKILLED_NURSING_FACILITY): Payer: Medicare Other | Admitting: Adult Health

## 2020-10-06 ENCOUNTER — Encounter: Payer: Self-pay | Admitting: Adult Health

## 2020-10-06 DIAGNOSIS — F0390 Unspecified dementia without behavioral disturbance: Secondary | ICD-10-CM

## 2020-10-06 DIAGNOSIS — I4891 Unspecified atrial fibrillation: Secondary | ICD-10-CM

## 2020-10-06 DIAGNOSIS — I25119 Atherosclerotic heart disease of native coronary artery with unspecified angina pectoris: Secondary | ICD-10-CM | POA: Diagnosis not present

## 2020-10-06 DIAGNOSIS — E118 Type 2 diabetes mellitus with unspecified complications: Secondary | ICD-10-CM | POA: Diagnosis not present

## 2020-10-06 DIAGNOSIS — J431 Panlobular emphysema: Secondary | ICD-10-CM | POA: Diagnosis not present

## 2020-10-06 NOTE — Progress Notes (Signed)
Location:  [place] Room Number: [id number] Place of Service:  SNF (31) Provider:  Durenda Age, DNP, FNP-BC  Patient Care Team: [name], MD as PCP - General  Extended Emergency Contact Information Primary Emergency Contact: [place] Phone: [phone] Mobile Phone: [phone] Relation: Son Secondary Emergency Contact: Dorothey Baseman Mobile Phone: [phone] Relation: Daughter  Code Status:  DNR  Goals of care: Advanced Directive information Advanced Directives 10/06/2020  Does Patient Have a Medical Advance Directive? Yes  Type of Paramedic of [place];Living will;Out of facility DNR (pink MOST or yellow form)  Does patient want to make changes to medical advance directive? No - Patient declined  Copy of [place] in Chart? Yes - validated most recent copy scanned in chart (See row information)  Would patient like information on creating a medical advance directive? -  Pre-existing out of facility DNR order (yellow form or pink MOST form) -     Chief Complaint  Patient presents with  . Medical Management of Chronic Issues    Short Term Rehab     HPI:  Pt is a 87 y.o. male seen today for medical management of chronic diseases. He is a short-term care resident of [facility]. He has a PMH of COPD, CAD S/P CABG, hypertension, hyperlipidemia and tobacco use. Resident was seen in the room today. He asked,"What are we waiting for?" No reported SOB nor wheezing. He takes PRN Albuterol and Breo Ellipta 100-25 mcg 1 puff daily for COPD. CBGs ranging from 91 to 185. He takes Metformin 500 mg daily for diabetes mellitus.  Past Medical History:  Diagnosis Date  . Aorto-iliac atherosclerosis ([place]) 08/2015   by xray  . Barrett's esophagus    EGD 2012, rpt 2014, longterm PPI  . Chronic idiopathic constipation 07/17/2010  . Chronic obstructive  pulmonary disease (COPD) (HCC)    spirometry with moderate COPD  . Coronary atherosclerosis of artery bypass graft   . Diabetes mellitus type II ~2008  . Gastrointestinal ulcer 04/2011   with hematemesis s/p EGD by [facility]  . GERD (gastroesophageal reflux disease)   . Hearing loss   . HLD (hyperlipidemia)   . HNP (herniated nucleus pulposus), lumbar 2013   with compression of L5 nerve root and foot drop, also with lumbar DDD s/p surgery  . Hypertrophy of prostate without urinary obstruction and other lower urinary tract symptoms (LUTS)   . Memory loss   . Paroxysmal SVT (supraventricular tachycardia) ([place])    a. 3/14 => converted in ED with adenosine to AFib => NSR;  b. Echo 4/14:  Mild LVH, mild FBSH, EF 55-60%, Gr 1 DD  . Prostate nodule ~2009   Left-benign s/p eval Uro WNL ([place])  . Thrombocytopenia ([place])   . Tobacco use disorder    Past Surgical History:  Procedure Laterality Date  . COLONOSCOPY  03/2009   small hemorrhoids, 2 polyps (hyperplastic and adenomatous), rpt 5 yrs (Dr. Barron Schmid) - pt declined rpt scheduling  . COLONOSCOPY  10/2014   2 hyperplastic polyp, hemmorhoids ([place])  . CORONARY ARTERY BYPASS GRAFT  1999   Dr. Roxy Manns  . ESOPHAGOGASTRODUODENOSCOPY  05/16/2011   after hematemesis, no lesions found, consistent with barrett's esophagus rpt 2 yrs  . ESOPHAGOGASTRODUODENOSCOPY  06/2013   barrett's, small HH, recheck 2-3 yrs ([place])  . HEMORRHOID SURGERY    . LUMBAR LAMINECTOMY/DECOMPRESSION MICRODISCECTOMY  01/31/2012   Procedure: LUMBAR LAMINECTOMY/DECOMPRESSION MICRODISCECTOMY 1 LEVEL;  Surgeon: Carmela HurtKyle L Cabbell, MD;  Location: [facility];  Service: Neurosurgery;  Laterality: Left;  LEFT Lumbar Four-Five Diskectomy  . NOSE SURGERY    . ROTATOR CUFF REPAIR  04/2010   right (but has bilat) Dr. Yisroel Rammingaldorf ([facility])    Allergies  Allergen Reactions  . Codeine     REACTION: vomiting  . Cyclobenzaprine Hcl     REACTION: vomiting  . Hydrocodone Nausea Only and  Other (See Comments)    "crazy"  . Tramadol Other (See Comments)    woozy  . Valium Other (See Comments)    sedation    Outpatient Encounter Medications as of 10/06/2020  Medication Sig  . albuterol (VENTOLIN HFA) 108 (90 Base) MCG/ACT inhaler Inhale 2 puffs into the lungs every 6 (six) hours as needed for wheezing or shortness of breath.  [name]. apixaban (ELIQUIS) 2.5 MG TABS tablet Take 1 tablet (2.5 mg total) by mouth 2 (two) times daily.  [name]. BREO ELLIPTA 100-25 MCG/INH AEPB USE 1 INHALATION BY MOUTH  INTO THE LUNGS DAILY  . donepezil (ARICEPT) 10 MG tablet Take 1 tablet (10 mg total) by mouth at bedtime.  . isosorbide mononitrate (IMDUR) 30 MG 24 hr tablet Take 1 tablet (30 mg total) by mouth daily.  . magnesium hydroxide (MILK OF MAGNESIA) 400 MG/5ML suspension Take 15 mLs by mouth daily as needed for moderate constipation.  . memantine (NAMENDA) 10 MG tablet Take 1 tablet (10 mg total) by mouth 2 (two) times daily.  . metFORMIN (GLUCOPHAGE) 500 MG tablet Take 1 tablet (500 mg total) by mouth daily with breakfast.  . metoprolol tartrate (LOPRESSOR) 25 MG tablet TAKE ONE-HALF TABLET BY  MOUTH TWICE DAILY  . omeprazole (PRILOSEC) 40 MG capsule TAKE 1 CAPSULE BY MOUTH  DAILY  . sertraline (ZOLOFT) 50 MG tablet TAKE 1 TABLET BY MOUTH  DAILY  . simvastatin (ZOCOR) 40 MG tablet TAKE 1 TABLET BY MOUTH AT  BEDTIME  . tamsulosin (FLOMAX) 0.4 MG CAPS capsule Take 1 capsule (0.4 mg total) by mouth daily.  . vitamin B-12 (CYANOCOBALAMIN) 500 MCG tablet Take 1 tablet (500 mcg total) by mouth every Monday, Wednesday, and Friday.   No facility-administered encounter medications on file as of 10/06/2020.    Review of Systems  Unable to obtain due to dementia   Immunization History  Administered Date(s) Administered  . Fluad Quad(high Dose 65+) 08/02/2019  . Influenza Whole 06/20/2010  . Influenza, High Dose Seasonal PF 06/02/2015, 07/04/2017, 06/15/2018  . Influenza, Seasonal, Injecte, Preservative  Fre 06/16/2014  . Influenza-Unspecified 07/17/2016  . Pneumococcal Polysaccharide-23 09/17/2011  . Td 07/01/1999, 08/14/2010   Pertinent  Health Maintenance Due  Topic Date Due  . OPHTHALMOLOGY EXAM  06/16/2012  . COLONOSCOPY (Pts 45-5867yrs Insurance coverage will need to be confirmed)  11/10/2019  . INFLUENZA VACCINE  04/16/2020  . URINE MICROALBUMIN  07/25/2020  . PNA vac Low Risk Adult (2 of 2 - PCV13) 12/14/2025 (Originally 09/16/2012)  . FOOT EXAM  01/23/2021  . HEMOGLOBIN A1C  03/12/2021   Fall Risk  04/10/2020 08/02/2019 07/07/2018 10/15/2017 10/01/2016  Falls in the past year? 0 1 No No No  Number falls in past yr: - 0 - - -  Injury with Fall? - 0 - - -     Vitals:   10/06/20 1418  BP: 127/66  Pulse: 88  Resp: 17  Temp: 98.4 F (36.9 C)  Weight: 154 lb 6.4 oz (70 kg)  Height: 5\' 8"  (1.727 m)  Body mass index is 23.48 kg/m.  Physical Exam  GENERAL APPEARANCE: Well nourished. In no acute distress. Normal body habitus SKIN:  Skin is warm and dry.  MOUTH and THROAT: Lips are without lesions. Oral mucosa is moist and without lesions.  RESPIRATORY: Breathing is even & unlabored, BS CTAB CARDIAC: RRR, no murmur,no extra heart sounds, no edema GI: Abdomen soft, normal BS, no masses, no tenderness EXTREMITIES:  Able to move X 4 extremities NEUROLOGICAL: There is no tremor. Speech is clear. Alert to self, disoriented to time and place. PSYCHIATRIC:  Affect and behavior are appropriate  Labs reviewed: Recent Labs    09/11/20 0913 09/11/20 1035 09/12/20 0110 09/13/20 0103 09/14/20 0049 09/15/20 0103 09/16/20 [id number] 09/16/20 0945 09/17/20 0113 09/18/20 0301 09/19/20 0114  NA  --    < > 142 140 141 145  --    < > 137 140 137  K  --    < > 4.8 4.9 4.8 5.1  --    < > 4.6 4.6 4.6  CL  --    < > 112* 114* 112* 116*  --    < > 111 110 111  CO2  --    < > 20* 16* 17* 18*  --    < > 17* 20* 19*  GLUCOSE  --    < > 117* 113* 113* 138*  --    < > 128* 127* 129*  BUN  --     < > 61* 51* 60* 63*  --    < > 42* 41* 37*  CREATININE  --    < > 1.99* 1.57* 1.85* 1.88*  --    < > 1.54* 1.65* 1.55*  CALCIUM  --    < > 8.5* 8.4* 8.6* 8.7*  --    < > 8.1* 8.3* 8.2*  MG 2.1  --  2.1 2.1 2.2 2.3 2.1  --   --   --   --   PHOS 4.1  --  3.1 2.4*  --   --   --   --   --   --   --    < > = values in this interval not displayed.   Recent Labs    09/17/20 0113 09/18/20 0301 09/19/20 0114  AST 32 29 25  ALT 37 38 35  ALKPHOS 80 94 88  BILITOT 0.8 0.9 1.0  PROT 5.6* 5.8* 5.6*  ALBUMIN 2.4* 2.4* 2.3*   Recent Labs    09/13/20 0103 09/14/20 0049 09/15/20 0103 09/16/20 0733 09/17/20 0113 09/18/20 0301 09/19/20 0114  WBC 7.6 7.8 7.4   < > 10.3 11.3* 11.9*  NEUTROABS 6.6 6.6 6.2  --   --   --   --   HGB 12.1* 12.0* 11.6*   < > 9.9* 11.0* 10.5*  HCT 36.5* 36.7* 35.1*   < > 29.5* 33.6* 31.4*  MCV 92.2 92.4 91.4   < > 89.7 92.1 91.5  PLT 85* 107* 104*   < > 142* 171 196   < > = values in this interval not displayed.   Lab Results  Component Value Date   TSH 3.56 07/26/2019   Lab Results  Component Value Date   HGBA1C 6.1 (H) 09/11/2020   Lab Results  Component Value Date   CHOL 141 07/26/2019   HDL 35.80 (L) 07/26/2019   LDLCALC 82 07/26/2019   LDLDIRECT 74.0 08/25/2015   TRIG 118.0 07/26/2019   CHOLHDL 4 07/26/2019  Significant Diagnostic Results in last 30 days:  DG Chest Port 1 View  Result Date: 09/19/2020 CLINICAL DATA:  Shortness of breath.  COVID. EXAM: PORTABLE CHEST 1 VIEW COMPARISON:  09/13/2020.  06/30/2018.  CT 09/10/2020. FINDINGS: Prior CABG. Heart size normal. Bilateral interstitial prominence again noted. Pneumonitis could present this fashion. Slight interim improvement from prior exam. No pleural effusion or pneumothorax. Reference is made to prior CT report of 09/10/2020 for discussion of pulmonary nodule present. No acute bony abnormality. IMPRESSION: 1. Prior CABG. Heart size normal. 2. Bilateral interstitial prominence again noted.  Pneumonitis could present in this fashion. Slight interim improvement from prior exam. 3. Reference is made to prior CT report of 09/10/2020 for discussion of pulmonary nodule present. Electronically Signed   By: Marcello Moores  Register   On: 09/19/2020 08:45   DG Chest Port 1 View  Result Date: 09/13/2020 CLINICAL DATA:  Shortness of breath.  Reported COVID-19 positive EXAM: PORTABLE CHEST 1 VIEW COMPARISON:  Chest radiograph and chest CT September 10, 2020 FINDINGS: There are areas of underlying fibrotic change. There is ill-defined airspace opacity superimposed on apparent fibrotic change in the lower lobe regions. No consolidation. The heart size is normal. The pulmonary vascularity is stable and appears consistent with underlying emphysematous change. Patient is status post coronary artery bypass grafting. No adenopathy. There is aortic atherosclerosis. No appreciable bone lesions. IMPRESSION: The appearance is felt to be indicative of ill-defined airspace opacity in the lower lung regions superimposed on underlying fibrotic type change. Underlying emphysematous change noted with decreased vascularity in the upper lobes, stable. Stable cardiac silhouette. Status post coronary artery bypass grafting. Aortic Atherosclerosis (ICD10-I70.0) and Emphysema (ICD10-J43.9). Electronically Signed   By: Lowella Grip III [name]   On: 09/13/2020 08:46   DG Swallowing Func-Speech Pathology  Result Date: 09/14/2020 Objective Swallowing Evaluation: Type of Study: MBS-Modified Barium Swallow Study  Patient Details Name: ISAYAH IGNASIAK MRN: [id number] Date of Birth: June 14, 1933 Today's Date: 09/14/2020 Time: SLP Start Time (ACUTE ONLY): 5277 -SLP Stop Time (ACUTE ONLY): 1409 SLP Time Calculation (min) (ACUTE ONLY): 14 min Past Medical History: Past Medical History: Diagnosis Date . Aorto-iliac atherosclerosis ([place]) 08/2015  by xray . Barrett's esophagus   EGD 2012, rpt 2014, longterm PPI . Chronic idiopathic constipation  07/17/2010 . Chronic obstructive pulmonary disease (COPD) (HCC)   spirometry with moderate COPD . Coronary atherosclerosis of artery bypass graft  . Diabetes mellitus type II ~2008 . Gastrointestinal ulcer 04/2011  with hematemesis s/p EGD by [facility] . GERD (gastroesophageal reflux disease)  . Hearing loss  . HLD (hyperlipidemia)  . HNP (herniated nucleus pulposus), lumbar 2013  with compression of L5 nerve root and foot drop, also with lumbar DDD s/p surgery . Hypertrophy of prostate without urinary obstruction and other lower urinary tract symptoms (LUTS)  . Memory loss  . Paroxysmal SVT (supraventricular tachycardia) ([place])   a. 3/14 => converted in ED with adenosine to AFib => NSR;  b. Echo 4/14:  Mild LVH, mild FBSH, EF 55-60%, Gr 1 DD . Prostate nodule ~2009  Left-benign s/p eval Uro WNL ([place]) . Thrombocytopenia ([place])  . Tobacco use disorder  Past Surgical History: Past Surgical History: Procedure Laterality Date . COLONOSCOPY  03/2009  small hemorrhoids, 2 polyps (hyperplastic and adenomatous), rpt 5 yrs (Dr. Barron Schmid) - pt declined rpt scheduling . COLONOSCOPY  10/2014  2 hyperplastic polyp, hemmorhoids ([place]) . CORONARY ARTERY BYPASS GRAFT  1999  Dr. Roxy Manns . ESOPHAGOGASTRODUODENOSCOPY  05/16/2011  after hematemesis, no lesions found,  consistent with barrett's esophagus rpt 2 yrs . ESOPHAGOGASTRODUODENOSCOPY  06/2013  barrett's, small HH, recheck 2-3 yrs ([place]) . HEMORRHOID SURGERY   . LUMBAR LAMINECTOMY/DECOMPRESSION MICRODISCECTOMY  01/31/2012  Procedure: LUMBAR LAMINECTOMY/DECOMPRESSION MICRODISCECTOMY 1 LEVEL;  Surgeon: Winfield Cunas, MD;  Location: [place];  Service: Neurosurgery;  Laterality: Left;  LEFT Lumbar Four-Five Diskectomy . NOSE SURGERY   . ROTATOR CUFF REPAIR  04/2010  right (but has bilat) Dr. Latanya Maudlin ([facility]) HPI: Pt is an 87 y.o. male with medical history significant of COPD, CAD status post CABG, HTN, HLD, dementia, DM who presented the emergency department with reported fall  and shortness of breath. Pt found to have acute hypoxic respiratory failure due to COVID-19 pneumonia. CXR 12/29: Ill-defined airspace opacity in the lower lung region superimposed on underlying fibrosis. Per referring MD's note, pt appeared "to be accumulating some secretions and wheezing/gurgling at times." and SLP was consulted "to make sure no overt aspiration".  No data recorded Assessment / Plan / Recommendation CHL IP CLINICAL IMPRESSIONS 09/14/2020 Clinical Impression Pt presents with oropharyngeal dysphagia characterized by reduced bolus propulsion, reduced lingual retraction, and a a pharyngeal delay. He demonstrated base of tongue residue, vallecular residue, and pyriform sinus reidue which were reduced with secondary swallows. Greater than 50% of thin liquid boluses was frequently propelled to the pyriform sinuses prior to any attempt at airway closure. Penetration and aspiration (PAS 5, 7) were demonstrated with thin liquids and resulted in weak, ineffective coughing. Penetration (PAS 3) was noted with nectar thick liquids via straw, but no via cup. No functional benefit was noted with a chin tuck posture. Additional trials and compensatory strategy attempts were refused by the pt who stated the he was tired and would not participate further. A dysphagia 2 diet with nectar thick liquids via cup is recommended at this time. SLP will follow for dysphagia treatment. SLP Visit Diagnosis Dysphagia, unspecified (R13.10) Attention and concentration deficit following -- Frontal lobe and executive function deficit following -- Impact on safety and function Moderate aspiration risk   CHL IP TREATMENT RECOMMENDATION 09/14/2020 Treatment Recommendations Therapy as outlined in treatment plan below   Prognosis 09/14/2020 Prognosis for Safe Diet Advancement Good Barriers to Reach Goals Severity of deficits Barriers/Prognosis Comment -- CHL IP DIET RECOMMENDATION 09/14/2020 SLP Diet Recommendations Dysphagia 2 (Fine  chop) solids;Nectar thick liquid Liquid Administration via Cup;No straw Medication Administration Whole meds with puree Compensations Slow rate;Small sips/bites Postural Changes Seated upright at 90 degrees;Remain semi-upright after after feeds/meals (Comment)   CHL IP OTHER RECOMMENDATIONS 09/14/2020 Recommended Consults -- Oral Care Recommendations Oral care BID Other Recommendations Order thickener from pharmacy   CHL IP FOLLOW UP RECOMMENDATIONS 09/14/2020 Follow up Recommendations (No Data)   CHL IP FREQUENCY AND DURATION 09/14/2020 Speech Therapy Frequency (ACUTE ONLY) min 2x/week Treatment Duration 2 weeks      CHL IP ORAL PHASE 09/14/2020 Oral Phase Impaired Oral - Pudding Teaspoon -- Oral - Pudding Cup -- Oral - Honey Teaspoon -- Oral - Honey Cup Reduced posterior propulsion Oral - Nectar Teaspoon Premature spillage;Decreased bolus cohesion Oral - Nectar Cup Premature spillage;Decreased bolus cohesion Oral - Nectar Straw Premature spillage;Decreased bolus cohesion Oral - Thin Teaspoon -- Oral - Thin Cup Premature spillage;Decreased bolus cohesion Oral - Thin Straw Premature spillage;Decreased bolus cohesion Oral - Puree Reduced posterior propulsion Oral - Mech Soft Reduced posterior propulsion Oral - Regular Reduced posterior propulsion Oral - Multi-Consistency -- Oral - Pill -- Oral Phase - Comment --  CHL IP PHARYNGEAL PHASE  09/14/2020 Pharyngeal Phase Impaired Pharyngeal- Pudding Teaspoon -- Pharyngeal -- Pharyngeal- Pudding Cup -- Pharyngeal -- Pharyngeal- Honey Teaspoon -- Pharyngeal -- Pharyngeal- Honey Cup Pharyngeal residue - valleculae;Pharyngeal residue - pyriform;Delayed swallow initiation-vallecula;Reduced tongue base retraction Pharyngeal -- Pharyngeal- Nectar Teaspoon -- Pharyngeal -- Pharyngeal- Nectar Cup Pharyngeal residue - valleculae;Pharyngeal residue - pyriform;Delayed swallow initiation-vallecula;Reduced tongue base retraction Pharyngeal Material does not enter airway Pharyngeal-  Nectar Straw Pharyngeal residue - valleculae;Pharyngeal residue - pyriform;Delayed swallow initiation-vallecula;Reduced tongue base retraction Pharyngeal Material enters airway, remains ABOVE vocal cords and not ejected out Pharyngeal- Thin Teaspoon -- Pharyngeal -- Pharyngeal- Thin Cup Pharyngeal residue - valleculae;Pharyngeal residue - pyriform;Delayed swallow initiation-vallecula;Reduced tongue base retraction Pharyngeal Material enters airway, passes BELOW cords and not ejected out despite cough attempt by patient Pharyngeal- Thin Straw Pharyngeal residue - valleculae;Pharyngeal residue - pyriform;Delayed swallow initiation-vallecula;Reduced tongue base retraction Pharyngeal Material enters airway, passes BELOW cords and not ejected out despite cough attempt by patient;Material enters airway, remains ABOVE vocal cords and not ejected out Pharyngeal- Puree Pharyngeal residue - valleculae;Pharyngeal residue - pyriform;Delayed swallow initiation-vallecula;Reduced tongue base retraction Pharyngeal -- Pharyngeal- Mechanical Soft Pharyngeal residue - valleculae;Pharyngeal residue - pyriform;Delayed swallow initiation-vallecula;Reduced tongue base retraction Pharyngeal -- Pharyngeal- Regular Pharyngeal residue - valleculae;Pharyngeal residue - pyriform;Delayed swallow initiation-vallecula;Reduced tongue base retraction Pharyngeal -- Pharyngeal- Multi-consistency NT Pharyngeal -- Pharyngeal- Pill NT Pharyngeal -- Pharyngeal Comment --  CHL IP CERVICAL ESOPHAGEAL PHASE 09/14/2020 Cervical Esophageal Phase WFL Pudding Teaspoon -- Pudding Cup -- Honey Teaspoon -- Honey Cup -- Nectar Teaspoon -- Nectar Cup -- Nectar Straw -- Thin Teaspoon -- Thin Cup -- Thin Straw -- Puree -- Mechanical Soft -- Regular -- Multi-consistency -- Pill -- Cervical Esophageal Comment -- Shanika I. Vear ClockPhillips, MS, CCC-SLP Acute Rehabilitation Services Office number [phone] Pager [phone] Scheryl MartenShanika I Phillips 09/14/2020, 4:14 PM               ECHOCARDIOGRAM COMPLETE  Result Date: 09/11/2020    ECHOCARDIOGRAM REPORT   [facility]   [name] Date of Exam: 09/11/2020 Medical Rec #:  [id number]        Height:       68.0 in Accession #:    [id number]       Weight:       155.0 lb Date of Birth:  1932/11/19       BSA:          1.834 m Patient Age:    87 years         BP:           135/71 mmHg Patient Gender: M                HR:           77 bpm. Exam Location:  Inpatient Procedure: 2D Echo, Cardiac Doppler and Color Doppler Indications:    I50.40* Unspecified combined systolic (congestive) and diastolic                 (congestive) heart failure  History:        Patient has prior history of Echocardiogram examinations, most                 recent 01/02/2013. CHF, CAD, Abnormal ECG, COPD, Arrythmias:SVT,                 Signs/Symptoms:Shortness of Breath, Dyspnea, Altered Mental                 Status and Alzheimer's; Risk Factors:Dyslipidemia. Covid  positive.  Sonographer:    Roseanna Rainbow RDCS Referring Phys: [place] Comments: Technically difficult study due to poor echo windows. Patient could not turn. Study interrupted to answer phone for endo. IMPRESSIONS  1. Left ventricular ejection fraction, by estimation, is 55 to 60%. The left ventricle has normal function. Abnormal sepal motion, no other regional wall motion abnormalities noted. There is mild concentric left ventricular hypertrophy. Left ventricular  diastolic parameters are indeterminate.  2. Right ventricular systolic function is normal. The right ventricular size is normal. Mildly increased right ventricular wall thickness.  3. The mitral valve is grossly normal. No evidence of mitral valve regurgitation.  4. The aortic valve was not well visualized. There is mild calcification of the aortic valve. Aortic valve regurgitation is not visualized. No aortic stenosis is present.  5. Aortic dilatation noted. There is mild dilatation of the aortic  root, measuring 40 mm.  6. The inferior vena cava is normal in size with greater than 50% respiratory variability, suggesting right atrial pressure of 3 mmHg. Comparison(s): A prior study was performed on 12/23/2012. Prior images reviewed side by side. Compared to prior, increase in aortic root size. FINDINGS  Left Ventricle: Left ventricular ejection fraction, by estimation, is 55 to 60%. The left ventricle has normal function. The left ventricle has no regional wall motion abnormalities. The left ventricular internal cavity size was normal in size. There is  mild concentric left ventricular hypertrophy. Left ventricular diastolic parameters are indeterminate. Right Ventricle: The right ventricular size is normal. Mildly increased right ventricular wall thickness. Right ventricular systolic function is normal. Left Atrium: Left atrial size was normal in size. Right Atrium: Right atrial size was normal in size. Pericardium: The pericardium was not well visualized. Mitral Valve: The mitral valve is grossly normal. No evidence of mitral valve regurgitation. Tricuspid Valve: The tricuspid valve is grossly normal. Tricuspid valve regurgitation is not demonstrated. Aortic Valve: The aortic valve was not well visualized. There is mild calcification of the aortic valve. Aortic valve regurgitation is not visualized. No aortic stenosis is present. Pulmonic Valve: The pulmonic valve was not well visualized. Pulmonic valve regurgitation is not visualized. Aorta: Aortic dilatation noted. There is mild dilatation of the aortic root, measuring 40 mm. Venous: The inferior vena cava is normal in size with greater than 50% respiratory variability, suggesting right atrial pressure of 3 mmHg. IAS/Shunts: The atrial septum is grossly normal.  LEFT VENTRICLE PLAX 2D LVIDd:         4.05 cm     Diastology LVIDs:         2.63 cm     LV e' medial:    7.29 cm/s LV PW:         1.10 cm     LV E/e' medial:  9.1 LV IVS:        1.10 cm     LV e'  lateral:   8.27 cm/s LVOT diam:     2.30 cm     LV E/e' lateral: 8.0 LV SV:         94 LV SV Index:   51 LVOT Area:     4.15 cm  LV Volumes (MOD) LV vol d, MOD A2C: 32.1 ml LV vol d, MOD A4C: 54.6 ml LV vol s, MOD A2C: 10.3 ml LV vol s, MOD A4C: 12.4 ml LV SV MOD A2C:     21.8 ml LV SV MOD A4C:     54.6 ml LV SV  MOD BP:      32.1 ml RIGHT VENTRICLE             IVC RV S prime:     14.50 cm/s  IVC diam: 1.80 cm TAPSE (M-mode): 1.7 cm LEFT ATRIUM           Index      RIGHT ATRIUM          Index LA diam:      2.40 cm 1.31 cm/m RA Area:     9.59 cm LA Vol (A2C): 14.7 ml 8.02 ml/m RA Volume:   18.60 ml 10.14 ml/m LA Vol (A4C): 8.7 ml  4.72 ml/m  AORTIC VALVE LVOT Vmax:   116.00 cm/s LVOT Vmean:  70.400 cm/s LVOT VTI:    0.227 m  AORTA Ao Root diam: 4.00 cm MITRAL VALVE MV Area (PHT): 2.20 cm     SHUNTS MV Decel Time: 345 msec     Systemic VTI:  0.23 m MV E velocity: 66.20 cm/s   Systemic Diam: 2.30 cm MV A velocity: 114.00 cm/s MV E/A ratio:  0.58 Rudean Haskell MD Electronically signed by Rudean Haskell MD Signature Date/Time: 09/11/2020/1:12:15 PM    Final    VAS [place] LOWER EXTREMITY VENOUS (DVT)  Result Date: 09/13/2020  Lower Venous DVT Study Indications: Swelling.  Risk Factors: COVID 19 positive. Comparison Study: No prior studies. Performing Technologist: Oliver Hum RVT  Examination Guidelines: A complete evaluation includes B-mode imaging, spectral Doppler, color Doppler, and power Doppler as needed of all accessible portions of each vessel. Bilateral testing is considered an integral part of a complete examination. Limited examinations for reoccurring indications may be performed as noted. The reflux portion of the exam is performed with the patient in reverse Trendelenburg.  +---------+---------------+---------+-----------+----------+--------------+ RIGHT    CompressibilityPhasicitySpontaneityPropertiesThrombus Aging  +---------+---------------+---------+-----------+----------+--------------+ CFV      Full           Yes      Yes                                 +---------+---------------+---------+-----------+----------+--------------+ SFJ      Full                                                        +---------+---------------+---------+-----------+----------+--------------+ FV Prox  Full                                                        +---------+---------------+---------+-----------+----------+--------------+ FV Mid   Full                                                        +---------+---------------+---------+-----------+----------+--------------+ FV DistalFull                                                        +---------+---------------+---------+-----------+----------+--------------+  PFV      Full                                                        +---------+---------------+---------+-----------+----------+--------------+ POP      Full           Yes      Yes                                 +---------+---------------+---------+-----------+----------+--------------+ PTV      Full                                                        +---------+---------------+---------+-----------+----------+--------------+ PERO     Full                                                        +---------+---------------+---------+-----------+----------+--------------+   +---------+---------------+---------+-----------+----------+--------------+ LEFT     CompressibilityPhasicitySpontaneityPropertiesThrombus Aging +---------+---------------+---------+-----------+----------+--------------+ CFV      Full           Yes      Yes                                 +---------+---------------+---------+-----------+----------+--------------+ SFJ      Full                                                         +---------+---------------+---------+-----------+----------+--------------+ FV Prox  Full                                                        +---------+---------------+---------+-----------+----------+--------------+ FV Mid   Full                                                        +---------+---------------+---------+-----------+----------+--------------+ FV DistalFull                                                        +---------+---------------+---------+-----------+----------+--------------+ PFV      Full                                                        +---------+---------------+---------+-----------+----------+--------------+  POP      Full           Yes      Yes                                 +---------+---------------+---------+-----------+----------+--------------+ PTV      Full                                                        +---------+---------------+---------+-----------+----------+--------------+ PERO     Full                                                        +---------+---------------+---------+-----------+----------+--------------+     Summary: RIGHT: - There is no evidence of deep vein thrombosis in the lower extremity.  - No cystic structure found in the popliteal fossa.  LEFT: - There is no evidence of deep vein thrombosis in the lower extremity.  - No cystic structure found in the popliteal fossa.  *See table(s) above for measurements and observations. Electronically signed by Jamelle Haring on 09/13/2020 at 5:38:10 PM.    Final     Assessment/Plan  1. Diabetes mellitus type 2 with complications ([place]) Lab Results  Component Value Date   HGBA1C 6.1 (H) 09/11/2020   -  CBGs stable, continue Metformin  2. Panlobular emphysema (HCC) -  No wheezing, continue Breo Ellipta and PRN Albuterol  3. Atrial fibrillation with RVR (HCC) -  Rate-controlled, continue metoprolol tartrate for rate control and Eliquis for  anticoagulation  4. Coronary artery disease involving native coronary artery of native heart with angina pectoris (HCC) -  no complaints of chest pains, continue isosorbide mononitrate ER and simvastatin  5. Dementia without behavioral disturbance, unspecified dementia type ([place]) -  Continue donepezil and memantine -   Continue supportive care    Family/ staff Communication: Discussed plan of care with resident and charge nurse.  Labs/tests ordered: None  Goals of care:   Short-term care   Durenda Age, DNP, MSN, FNP-BC [facility] [phone] (Monday-Friday 8:00 a.m. - 5:00 p.m.) [phone] (after hours)

## 2020-10-10 NOTE — Progress Notes (Signed)
   09/14/20 0800  Assess: MEWS Score  BP 123/66  Pulse Rate (!) 107  ECG Heart Rate (!) 122  Resp (!) 30  SpO2 93 %  Assess: MEWS Score  MEWS Temp 0  MEWS Systolic 0  MEWS Pulse 2  MEWS RR 2  MEWS LOC 0  MEWS Score 4  MEWS Score Color Red  Assess: if the MEWS score is Yellow or Red  Were vital signs taken at a resting state? Yes  Focused Assessment No change from prior assessment  Early Detection of Sepsis Score *See Row Information* Low  MEWS guidelines implemented *See Row Information* Yes  Treat  MEWS Interventions Escalated (See documentation below)  Take Vital Signs  Increase Vital Sign Frequency  Red: Q 1hr X 4 then Q 4hr X 4, if remains red, continue Q 4hrs  Escalate  MEWS: Escalate Red: discuss with charge nurse/RN and provider, consider discussing with RRT  Notify: Charge Nurse/RN  Name of Charge Nurse/RN Notified [place], RN  Date Charge Nurse/RN Notified 09/14/20  Time Charge Nurse/RN Notified 0800  Notify: Provider  Provider Name/Title Dr. Sloan Leiter  Date Provider Notified 09/14/20  Time Provider Notified [phone]  Notification Type Page  Notification Reason Other (Comment) ([facility])  Response No new orders;See new orders  Date of Provider Response 09/14/20  Time of Provider Response [phone]

## 2020-10-13 ENCOUNTER — Encounter: Payer: Self-pay | Admitting: Adult Health

## 2020-10-13 ENCOUNTER — Non-Acute Institutional Stay (SKILLED_NURSING_FACILITY): Payer: Medicare Other | Admitting: Adult Health

## 2020-10-13 DIAGNOSIS — J431 Panlobular emphysema: Secondary | ICD-10-CM | POA: Diagnosis not present

## 2020-10-13 DIAGNOSIS — I25119 Atherosclerotic heart disease of native coronary artery with unspecified angina pectoris: Secondary | ICD-10-CM | POA: Diagnosis not present

## 2020-10-13 DIAGNOSIS — I4891 Unspecified atrial fibrillation: Secondary | ICD-10-CM

## 2020-10-13 DIAGNOSIS — E118 Type 2 diabetes mellitus with unspecified complications: Secondary | ICD-10-CM | POA: Diagnosis not present

## 2020-10-13 NOTE — Progress Notes (Addendum)
Location:  [facility] Room Number: 115/A Place of Service:  SNF (31) Provider:  Durenda Age, DNP, FNP-BC  Patient Care Team: [name], MD as PCP - General  Extended Emergency Contact Information Primary Emergency Contact: [place] Phone: [phone] Mobile Phone: [phone] Relation: Son Secondary Emergency Contact: Dorothey Baseman Mobile Phone: [phone] Relation: Daughter  Code Status:  DNR  Goals of care: Advanced Directive information Advanced Directives 10/13/2020  Does Patient Have a Medical Advance Directive? Yes  Type of Paramedic of [place];Living will;Out of facility DNR (pink MOST or yellow form)  Does patient want to make changes to medical advance directive? No - Patient declined  Copy of [place] in Chart? Yes - validated most recent copy scanned in chart (See row information)  Would patient like information on creating a medical advance directive? -  Pre-existing out of facility DNR order (yellow form or pink MOST form) -     Chief Complaint  Patient presents with  . Acute Visit    Short Term Rehab    HPI:  Pt is a 87 y.o. male seen today for medical management of chronic diseases. He has a PMH of COPD, CAD S/P CABG, hypertension, hyperlipidemia and tobacco use. He was seen in the room today. He was yelling. He was reported to have fallen at midnight. He was seen sitting on the floor. There was no noted injury. CBGs ranging from 88 to 185.  He takes Metformin 500 mg daily.  No complaints of chest pains, he takes isosorbide mononitrate ER 30 mg daily for CAD.   Past Medical History:  Diagnosis Date  . Aorto-iliac atherosclerosis ([place]) 08/2015   by xray  . Barrett's esophagus    EGD 2012, rpt 2014, longterm PPI  . Chronic idiopathic constipation 07/17/2010  . Chronic obstructive pulmonary disease (COPD) (HCC)    spirometry with  moderate COPD  . Coronary atherosclerosis of artery bypass graft   . Diabetes mellitus type II ~2008  . Gastrointestinal ulcer 04/2011   with hematemesis s/p EGD by [facility]  . GERD (gastroesophageal reflux disease)   . Hearing loss   . HLD (hyperlipidemia)   . HNP (herniated nucleus pulposus), lumbar 2013   with compression of L5 nerve root and foot drop, also with lumbar DDD s/p surgery  . Hypertrophy of prostate without urinary obstruction and other lower urinary tract symptoms (LUTS)   . Memory loss   . Paroxysmal SVT (supraventricular tachycardia) ([name])    a. 3/14 => converted in ED with adenosine to AFib => NSR;  b. Echo 4/14:  Mild LVH, mild FBSH, EF 55-60%, Gr 1 DD  . Prostate nodule ~2009   Left-benign s/p eval Uro WNL ([place])  . Thrombocytopenia ([place])   . Tobacco use disorder    Past Surgical History:  Procedure Laterality Date  . COLONOSCOPY  03/2009   small hemorrhoids, 2 polyps (hyperplastic and adenomatous), rpt 5 yrs (Dr. Barron Schmid) - pt declined rpt scheduling  . COLONOSCOPY  10/2014   2 hyperplastic polyp, hemmorhoids ([place])  . CORONARY ARTERY BYPASS GRAFT  1999   Dr. Roxy Manns  . ESOPHAGOGASTRODUODENOSCOPY  05/16/2011   after hematemesis, no lesions found, consistent with barrett's esophagus rpt 2 yrs  . ESOPHAGOGASTRODUODENOSCOPY  06/2013   barrett's, small HH, recheck 2-3 yrs ([place])  . HEMORRHOID SURGERY    . LUMBAR LAMINECTOMY/DECOMPRESSION MICRODISCECTOMY  01/31/2012   Procedure: LUMBAR LAMINECTOMY/DECOMPRESSION MICRODISCECTOMY 1 LEVEL;  Surgeon:  Winfield Cunas, MD;  Location: [facility]UR[facility] ORS;  Service: Neurosurgery;  Laterality: Left;  LEFT Lumbar Four-Five Diskectomy  . NOSE SURGERY    . ROTATOR CUFF REPAIR  04/2010   right (but has bilat) Dr. Latanya Maudlin ([facility])    Allergies  Allergen Reactions  . Codeine     REACTION: vomiting  . Cyclobenzaprine Hcl     REACTION: vomiting  . Hydrocodone Nausea Only and Other (See Comments)    "crazy"  . Tramadol  Other (See Comments)    woozy  . Valium Other (See Comments)    sedation    Outpatient Encounter Medications as of 10/13/2020  Medication Sig  . albuterol (VENTOLIN HFA) 108 (90 Base) MCG/ACT inhaler Inhale 2 puffs into the lungs every 6 (six) hours as needed for wheezing or shortness of breath.  [name] apixaban (ELIQUIS) 2.5 MG TABS tablet Take 1 tablet (2.5 mg total) by mouth 2 (two) times daily.  [name] donepezil (ARICEPT) 10 MG tablet Take 1 tablet (10 mg total) by mouth at bedtime.  . fluticasone furoate-vilanterol (BREO ELLIPTA) 100-25 MCG/INH AEPB Inhale 1 puff into the lungs daily. For COPD  . isosorbide mononitrate (IMDUR) 30 MG 24 hr tablet Take 1 tablet (30 mg total) by mouth daily.  . magnesium hydroxide (MILK OF MAGNESIA) 400 MG/5ML suspension Take 15 mLs by mouth daily as needed for moderate constipation.  . melatonin 5 MG TABS Take 5 mg by mouth at bedtime. For Insomnia  . memantine (NAMENDA) 10 MG tablet Take 1 tablet (10 mg total) by mouth 2 (two) times daily.  . metFORMIN (GLUCOPHAGE) 500 MG tablet Take 1 tablet (500 mg total) by mouth daily with breakfast.  . metoprolol tartrate (LOPRESSOR) 25 MG tablet TAKE ONE-HALF TABLET BY  MOUTH TWICE DAILY  . nicotine (NICODERM CQ - DOSED IN MG/24 HOURS) 21 mg/24hr patch Place 21 mg onto the skin daily.  . NON FORMULARY Med Pass 120 m l po three times a day for nutrition and to pervent wt loss  . omeprazole (PRILOSEC) 40 MG capsule TAKE 1 CAPSULE BY MOUTH  DAILY  . OXYGEN Inhale 2 L into the lungs continuous.  . sertraline (ZOLOFT) 50 MG tablet TAKE 1 TABLET BY MOUTH  DAILY  . simvastatin (ZOCOR) 40 MG tablet TAKE 1 TABLET BY MOUTH AT  BEDTIME  . tamsulosin (FLOMAX) 0.4 MG CAPS capsule Take 1 capsule (0.4 mg total) by mouth daily.  . vitamin B-12 (CYANOCOBALAMIN) 500 MCG tablet Take 1 tablet (500 mcg total) by mouth every Monday, Wednesday, and Friday.  . [DISCONTINUED] BREO ELLIPTA 100-25 MCG/INH AEPB USE 1 INHALATION BY MOUTH  INTO THE LUNGS  DAILY (Patient taking differently: Inhale 1 puff into the lungs daily. For COPD)   No facility-administered encounter medications on file as of 10/13/2020.    Review of Systems unable to obtain due to dementia.    Immunization History  Administered Date(s) Administered  . Fluad Quad(high Dose 65+) 08/02/2019  . Influenza Whole 06/20/2010  . Influenza, High Dose Seasonal PF 06/02/2015, 07/04/2017, 06/15/2018  . Influenza, Seasonal, Injecte, Preservative Fre 06/16/2014  . Influenza-Unspecified 07/17/2016  . Pneumococcal Polysaccharide-23 09/17/2011  . Td 07/01/1999, 08/14/2010   Pertinent  Health Maintenance Due  Topic Date Due  . OPHTHALMOLOGY EXAM  06/16/2012  . COLONOSCOPY (Pts 45-48yrs Insurance coverage will need to be confirmed)  11/10/2019  . INFLUENZA VACCINE  04/16/2020  . URINE MICROALBUMIN  07/25/2020  . PNA vac Low Risk Adult (2 of 2 - PCV13)  12/14/2025 (Originally 09/16/2012)  . FOOT EXAM  01/23/2021  . HEMOGLOBIN A1C  03/12/2021   Fall Risk  04/10/2020 08/02/2019 07/07/2018 10/15/2017 10/01/2016  Falls in the past year? 0 1 No No No  Number falls in past yr: - 0 - - -  Injury with Fall? - 0 - - -     Vitals:   10/13/20 1504  BP: (!) 117/53  Pulse: (!) 103  Resp: 17  Temp: (!) 96.3 F (35.7 C)  Weight: 145 lb 3.2 oz (65.9 kg)  Height: 5\' 8"  (1.727 m)   Body mass index is 22.08 kg/m.  Physical Exam  GENERAL APPEARANCE: Well nourished. In no acute distress. Normal body habitus SKIN:  Skin is warm and dry.  MOUTH and THROAT: Lips are without lesions. Oral mucosa is moist and without lesions.  RESPIRATORY: Breathing is even & unlabored, BS CTAB CARDIAC: RRR, no murmur,no extra heart sounds, no edema GI: Abdomen soft, normal BS, no masses, no tenderness EXTREMITIES:  Able to move X 4 extremities NEUROLOGICAL: There is no tremor. Speech is clear. Alert to self, disoriented to time and place. PSYCHIATRIC:  Affect and behavior are appropriate  Labs  reviewed: Recent Labs    09/11/20 0913 09/11/20 1035 09/12/20 0110 09/13/20 0103 09/14/20 0049 09/15/20 0103 09/16/20 [id number] 09/16/20 0945 09/17/20 0113 09/18/20 0301 09/19/20 0114  NA  --    < > 142 140 141 145  --    < > 137 140 137  K  --    < > 4.8 4.9 4.8 5.1  --    < > 4.6 4.6 4.6  CL  --    < > 112* 114* 112* 116*  --    < > 111 110 111  CO2  --    < > 20* 16* 17* 18*  --    < > 17* 20* 19*  GLUCOSE  --    < > 117* 113* 113* 138*  --    < > 128* 127* 129*  BUN  --    < > 61* 51* 60* 63*  --    < > 42* 41* 37*  CREATININE  --    < > 1.99* 1.57* 1.85* 1.88*  --    < > 1.54* 1.65* 1.55*  CALCIUM  --    < > 8.5* 8.4* 8.6* 8.7*  --    < > 8.1* 8.3* 8.2*  MG 2.1  --  2.1 2.1 2.2 2.3 2.1  --   --   --   --   PHOS 4.1  --  3.1 2.4*  --   --   --   --   --   --   --    < > = values in this interval not displayed.   Recent Labs    09/17/20 0113 09/18/20 0301 09/19/20 0114  AST 32 29 25  ALT 37 38 35  ALKPHOS 80 94 88  BILITOT 0.8 0.9 1.0  PROT 5.6* 5.8* 5.6*  ALBUMIN 2.4* 2.4* 2.3*   Recent Labs    09/13/20 0103 09/14/20 0049 09/15/20 0103 09/16/20 0733 09/17/20 0113 09/18/20 0301 09/19/20 0114  WBC 7.6 7.8 7.4   < > 10.3 11.3* 11.9*  NEUTROABS 6.6 6.6 6.2  --   --   --   --   HGB 12.1* 12.0* 11.6*   < > 9.9* 11.0* 10.5*  HCT 36.5* 36.7* 35.1*   < > 29.5* 33.6* 31.4*  MCV 92.2 92.4 91.4   < >  89.7 92.1 91.5  PLT 85* 107* 104*   < > 142* 171 196   < > = values in this interval not displayed.   Lab Results  Component Value Date   TSH 3.56 07/26/2019   Lab Results  Component Value Date   HGBA1C 6.1 (H) 09/11/2020   Lab Results  Component Value Date   CHOL 141 07/26/2019   HDL 35.80 (L) 07/26/2019   LDLCALC 82 07/26/2019   LDLDIRECT 74.0 08/25/2015   TRIG 118.0 07/26/2019   CHOLHDL 4 07/26/2019    Significant Diagnostic Results in last 30 days:  DG Chest Port 1 View  Result Date: 09/19/2020 CLINICAL DATA:  Shortness of breath.  COVID. EXAM: PORTABLE  CHEST 1 VIEW COMPARISON:  09/13/2020.  06/30/2018.  CT 09/10/2020. FINDINGS: Prior CABG. Heart size normal. Bilateral interstitial prominence again noted. Pneumonitis could present this fashion. Slight interim improvement from prior exam. No pleural effusion or pneumothorax. Reference is made to prior CT report of 09/10/2020 for discussion of pulmonary nodule present. No acute bony abnormality. IMPRESSION: 1. Prior CABG. Heart size normal. 2. Bilateral interstitial prominence again noted. Pneumonitis could present in this fashion. Slight interim improvement from prior exam. 3. Reference is made to prior CT report of 09/10/2020 for discussion of pulmonary nodule present. Electronically Signed   By: Marcello Moores  Register   On: 09/19/2020 08:45   DG Swallowing Func-Speech Pathology  Result Date: 09/14/2020 Objective Swallowing Evaluation: Type of Study: MBS-Modified Barium Swallow Study  Patient Details Name: ROAN SOUCIE MRN: [id number] Date of Birth: [birth year] Today's Date: 09/14/2020 Time: SLP Start Time (ACUTE ONLY): [id number] -SLP Stop Time (ACUTE ONLY): 1409 SLP Time Calculation (min) (ACUTE ONLY): 14 min Past Medical History: Past Medical History: Diagnosis Date . Aorto-iliac atherosclerosis ([place]) 08/2015  by xray . Barrett's esophagus   EGD 2012, rpt 2014, longterm PPI . Chronic idiopathic constipation 07/17/2010 . Chronic obstructive pulmonary disease (COPD) (HCC)   spirometry with moderate COPD . Coronary atherosclerosis of artery bypass graft  . Diabetes mellitus type II ~2008 . Gastrointestinal ulcer 04/2011  with hematemesis s/p EGD by [facility] . GERD (gastroesophageal reflux disease)  . Hearing loss  . HLD (hyperlipidemia)  . HNP (herniated nucleus pulposus), lumbar 2013  with compression of L5 nerve root and foot drop, also with lumbar DDD s/p surgery . Hypertrophy of prostate without urinary obstruction and other lower urinary tract symptoms (LUTS)  . Memory loss  . Paroxysmal SVT (supraventricular  tachycardia) ([place])   a. 3/14 => converted in ED with adenosine to AFib => NSR;  b. Echo 4/14:  Mild LVH, mild FBSH, EF 55-60%, Gr 1 DD . Prostate nodule ~2009  Left-benign s/p eval Uro WNL ([place]) . Thrombocytopenia ([place])  . Tobacco use disorder  Past Surgical History: Past Surgical History: Procedure Laterality Date . COLONOSCOPY  03/2009  small hemorrhoids, 2 polyps (hyperplastic and adenomatous), rpt 5 yrs (Dr. Barron Schmid) - pt declined rpt scheduling . COLONOSCOPY  10/2014  2 hyperplastic polyp, hemmorhoids ([place]) . CORONARY ARTERY BYPASS GRAFT  1999  Dr. Roxy Manns . ESOPHAGOGASTRODUODENOSCOPY  05/16/2011  after hematemesis, no lesions found, consistent with barrett's esophagus rpt 2 yrs . ESOPHAGOGASTRODUODENOSCOPY  06/2013  barrett's, small HH, recheck 2-3 yrs ([place]) . HEMORRHOID SURGERY   . LUMBAR LAMINECTOMY/DECOMPRESSION MICRODISCECTOMY  01/31/2012  Procedure: LUMBAR LAMINECTOMY/DECOMPRESSION MICRODISCECTOMY 1 LEVEL;  Surgeon: Winfield Cunas, MD;  Location: [facility];  Service: Neurosurgery;  Laterality: Left;  LEFT Lumbar Four-Five Diskectomy . NOSE SURGERY   . ROTATOR  CUFF REPAIR  04/2010  right (but has bilat) Dr. Latanya Maudlin ([facility]) HPI: Pt is an 87 y.o. male with medical history significant of COPD, CAD status post CABG, HTN, HLD, dementia, DM who presented the emergency department with reported fall and shortness of breath. Pt found to have acute hypoxic respiratory failure due to COVID-19 pneumonia. CXR 12/29: Ill-defined airspace opacity in the lower lung region superimposed on underlying fibrosis. Per referring MD's note, pt appeared "to be accumulating some secretions and wheezing/gurgling at times." and SLP was consulted "to make sure no overt aspiration".  No data recorded Assessment / Plan / Recommendation CHL IP CLINICAL IMPRESSIONS 09/14/2020 Clinical Impression Pt presents with oropharyngeal dysphagia characterized by reduced bolus propulsion, reduced lingual retraction, and a a  pharyngeal delay. He demonstrated base of tongue residue, vallecular residue, and pyriform sinus reidue which were reduced with secondary swallows. Greater than 50% of thin liquid boluses was frequently propelled to the pyriform sinuses prior to any attempt at airway closure. Penetration and aspiration (PAS 5, 7) were demonstrated with thin liquids and resulted in weak, ineffective coughing. Penetration (PAS 3) was noted with nectar thick liquids via straw, but no via cup. No functional benefit was noted with a chin tuck posture. Additional trials and compensatory strategy attempts were refused by the pt who stated the he was tired and would not participate further. A dysphagia 2 diet with nectar thick liquids via cup is recommended at this time. SLP will follow for dysphagia treatment. SLP Visit Diagnosis Dysphagia, unspecified (R13.10) Attention and concentration deficit following -- Frontal lobe and executive function deficit following -- Impact on safety and function Moderate aspiration risk   CHL IP TREATMENT RECOMMENDATION 09/14/2020 Treatment Recommendations Therapy as outlined in treatment plan below   Prognosis 09/14/2020 Prognosis for Safe Diet Advancement Good Barriers to Reach Goals Severity of deficits Barriers/Prognosis Comment -- CHL IP DIET RECOMMENDATION 09/14/2020 SLP Diet Recommendations Dysphagia 2 (Fine chop) solids;Nectar thick liquid Liquid Administration via Cup;No straw Medication Administration Whole meds with puree Compensations Slow rate;Small sips/bites Postural Changes Seated upright at 90 degrees;Remain semi-upright after after feeds/meals (Comment)   CHL IP OTHER RECOMMENDATIONS 09/14/2020 Recommended Consults -- Oral Care Recommendations Oral care BID Other Recommendations Order thickener from pharmacy   CHL IP FOLLOW UP RECOMMENDATIONS 09/14/2020 Follow up Recommendations (No Data)   CHL IP FREQUENCY AND DURATION 09/14/2020 Speech Therapy Frequency (ACUTE ONLY) min 2x/week Treatment  Duration 2 weeks      CHL IP ORAL PHASE 09/14/2020 Oral Phase Impaired Oral - Pudding Teaspoon -- Oral - Pudding Cup -- Oral - Honey Teaspoon -- Oral - Honey Cup Reduced posterior propulsion Oral - Nectar Teaspoon Premature spillage;Decreased bolus cohesion Oral - Nectar Cup Premature spillage;Decreased bolus cohesion Oral - Nectar Straw Premature spillage;Decreased bolus cohesion Oral - Thin Teaspoon -- Oral - Thin Cup Premature spillage;Decreased bolus cohesion Oral - Thin Straw Premature spillage;Decreased bolus cohesion Oral - Puree Reduced posterior propulsion Oral - Mech Soft Reduced posterior propulsion Oral - Regular Reduced posterior propulsion Oral - Multi-Consistency -- Oral - Pill -- Oral Phase - Comment --  CHL IP PHARYNGEAL PHASE 09/14/2020 Pharyngeal Phase Impaired Pharyngeal- Pudding Teaspoon -- Pharyngeal -- Pharyngeal- Pudding Cup -- Pharyngeal -- Pharyngeal- Honey Teaspoon -- Pharyngeal -- Pharyngeal- Honey Cup Pharyngeal residue - valleculae;Pharyngeal residue - pyriform;Delayed swallow initiation-vallecula;Reduced tongue base retraction Pharyngeal -- Pharyngeal- Nectar Teaspoon -- Pharyngeal -- Pharyngeal- Nectar Cup Pharyngeal residue - valleculae;Pharyngeal residue - pyriform;Delayed swallow initiation-vallecula;Reduced tongue base retraction Pharyngeal Material does not enter airway  Pharyngeal- Nectar Straw Pharyngeal residue - valleculae;Pharyngeal residue - pyriform;Delayed swallow initiation-vallecula;Reduced tongue base retraction Pharyngeal Material enters airway, remains ABOVE vocal cords and not ejected out Pharyngeal- Thin Teaspoon -- Pharyngeal -- Pharyngeal- Thin Cup Pharyngeal residue - valleculae;Pharyngeal residue - pyriform;Delayed swallow initiation-vallecula;Reduced tongue base retraction Pharyngeal Material enters airway, passes BELOW cords and not ejected out despite cough attempt by patient Pharyngeal- Thin Straw Pharyngeal residue - valleculae;Pharyngeal residue -  pyriform;Delayed swallow initiation-vallecula;Reduced tongue base retraction Pharyngeal Material enters airway, passes BELOW cords and not ejected out despite cough attempt by patient;Material enters airway, remains ABOVE vocal cords and not ejected out Pharyngeal- Puree Pharyngeal residue - valleculae;Pharyngeal residue - pyriform;Delayed swallow initiation-vallecula;Reduced tongue base retraction Pharyngeal -- Pharyngeal- Mechanical Soft Pharyngeal residue - valleculae;Pharyngeal residue - pyriform;Delayed swallow initiation-vallecula;Reduced tongue base retraction Pharyngeal -- Pharyngeal- Regular Pharyngeal residue - valleculae;Pharyngeal residue - pyriform;Delayed swallow initiation-vallecula;Reduced tongue base retraction Pharyngeal -- Pharyngeal- Multi-consistency NT Pharyngeal -- Pharyngeal- Pill NT Pharyngeal -- Pharyngeal Comment --  CHL IP CERVICAL ESOPHAGEAL PHASE 09/14/2020 Cervical Esophageal Phase WFL Pudding Teaspoon -- Pudding Cup -- Honey Teaspoon -- Honey Cup -- Nectar Teaspoon -- Nectar Cup -- Nectar Straw -- Thin Teaspoon -- Thin Cup -- Thin Straw -- Puree -- Mechanical Soft -- Regular -- Multi-consistency -- Pill -- Cervical Esophageal Comment -- Shanika I. Hardin Negus, [place], [place] Office number [phone] Pager [phone] Horton Marshall 09/14/2020, 4:14 PM               Assessment/Plan  1. Coronary artery disease involving native coronary artery of native heart with angina pectoris (HCC) -  No chest pains, continue  Eliquis and Simvastatin  2. Panlobular emphysema (HCC) -  No wheezing, continue Breo Ellipta INH and PRN Albuterol -   Continue O2 @ 2L/min via [place]  3. Diabetes mellitus type 2 with complications [facility]) Lab Results  Component Value Date   HGBA1C 6.1 (H) 09/11/2020   -  Continue Metformin and monitor CBGs  4. Atrial fibrillation with RVR (HCC) -  Rate-controlled,  continue Metoprolol tartrate for rate-control and Eliquis for  anticoagulation     Family/ staff Communication:   Discussed plan of care with charge nurse.  Labs/tests ordered:  None  Goals of care:   Short-term care   Durenda Age, DNP, MSN, FNP-BC [facility] [phone] (Monday-Friday 8:00 a.m. - 5:00 p.m.) [phone] (after hours)

## 2020-10-17 ENCOUNTER — Non-Acute Institutional Stay (SKILLED_NURSING_FACILITY): Payer: Medicare Other | Admitting: Internal Medicine

## 2020-10-17 ENCOUNTER — Encounter: Payer: Self-pay | Admitting: Internal Medicine

## 2020-10-17 DIAGNOSIS — E440 Moderate protein-calorie malnutrition: Secondary | ICD-10-CM | POA: Diagnosis not present

## 2020-10-17 DIAGNOSIS — F0390 Unspecified dementia without behavioral disturbance: Secondary | ICD-10-CM

## 2020-10-17 DIAGNOSIS — N1832 Chronic kidney disease, stage 3b: Secondary | ICD-10-CM

## 2020-10-17 DIAGNOSIS — E538 Deficiency of other specified B group vitamins: Secondary | ICD-10-CM

## 2020-10-17 DIAGNOSIS — D649 Anemia, unspecified: Secondary | ICD-10-CM

## 2020-10-17 DIAGNOSIS — N183 Chronic kidney disease, stage 3 unspecified: Secondary | ICD-10-CM | POA: Insufficient documentation

## 2020-10-17 DIAGNOSIS — E46 Unspecified protein-calorie malnutrition: Secondary | ICD-10-CM | POA: Insufficient documentation

## 2020-10-17 NOTE — Patient Instructions (Addendum)
See assessment and plan under each diagnosis in the problem list and acutely for this visit Total time 47 minutes; greater than 50% of the visit spent counseling patient and coordinating care for problems addressed at this encounter. I shared the results of his MMSE and suggested that his PCP be involved in assessing any neurocognitive deficit.  This will also be discussed with his son.  I believe he does have significant dementia; but legal intervention may be necessary due to his failure to accept this possibility.

## 2020-10-17 NOTE — Assessment & Plan Note (Addendum)
H/H has dropped from 11/33.6 down to 10.5/31.4.  This is in the context of a history of Barrett's esophagus. No bleeding dyscrasias reported by staff.  He denies active GI symptoms.

## 2020-10-17 NOTE — Assessment & Plan Note (Addendum)
Clinically this gentleman does appear to have vascular dementia which may have been impacted by his recent Covid infection.  More likely than not legal intervention will be necessary as he is very angry and does not comprehend that he has a neurocognitive deficit.  Perhaps it would be best to have his PCP reevaluate him as I have only seen the gentleman twice in an acute setting. Update B12, VDRL/RPR, and TSH

## 2020-10-17 NOTE — Assessment & Plan Note (Addendum)
Creatinine and GFR have improved from 1.65/40 to 1.55/44.  Avoid nephrotoxic drugs.

## 2020-10-17 NOTE — Assessment & Plan Note (Addendum)
01/24/2020 B12 level greater than 1500, supranormal.  He is on low-dose B12 daily. Recheck B12 level.

## 2020-10-17 NOTE — Progress Notes (Signed)
NURSING HOME LOCATION:  [facility] ROOM NUMBER:  115-A  CODE STATUS:  DNR  PCP:  [name] MD  This is a nursing facility follow up visit for specific issue of competency assessment @ request his son,his HCPOA  Interim medical record and care since last [place] visit was updated with review of diagnostic studies and change in clinical status since last visit were documented.  HPI: He is not a permanent resident facility.  His son has requested a competency evaluation. In 2019 the patient did have MMSE performed with a score of 24 indicating some neurocognitive deficit. This is in the context of MRI imaging demonstrating lacunar infarcts.  He is on both Aricept and Namenda presumably for a diagnosis of Alzheimer's.  Past medical history includes tobacco use disorder, PSVT, dyslipidemia, diabetes with vascular complications, and COPD. Significantly he has a 60-pack-year history of smoking.  Review of systems: His only concern is that he wants to go home.  He could not tell me why he had been in the hospital.  He stated that he has had the same PCP for years but cannot remember his name.  He does not remember meeting me when he first arrived at the facility.  Constitutional: No fever, significant weight change, fatigue  Eyes: No redness, discharge, pain, vision change ENT/mouth: No nasal congestion,  purulent discharge, earache, change in hearing, sore throat  Cardiovascular: No chest pain, palpitations, paroxysmal nocturnal dyspnea, claudication, edema  Respiratory: No cough, sputum production, hemoptysis, DOE, significant snoring, apnea   Gastrointestinal: No heartburn, dysphagia, abdominal pain, nausea /vomiting, rectal bleeding, melena, change in bowels Genitourinary: No dysuria, hematuria, pyuria, incontinence, nocturia Musculoskeletal: No joint stiffness, joint swelling, weakness, pain Dermatologic: No rash, pruritus, change in  appearance of skin Neurologic: No dizziness, headache, syncope, seizures, numbness, tingling Psychiatric: No significant anxiety, depression, insomnia, anorexia Endocrine: No change in hair/skin/nails, excessive thirst, excessive hunger, excessive urination  Hematologic/lymphatic: No significant bruising, lymphadenopathy, abnormal bleeding Allergy/immunology: No itchy/watery eyes, significant sneezing, urticaria, angioedema  Physical exam:  Pertinent or positive findings: He is obviously frustrated and angry, interspersing cuss words into his answers.  He is markedly hard of hearing.  He appears somewhat chronically ill.  He is Geneticist, molecular.  He is wearing upper and lower partials.  Breath sounds are decreased.  Posterior tibial pulses are stronger than the dorsalis pedis pulses.  Strength and opposition is good in all extremities.  He exhibits voluntary repetitive flexion of the left lower leg.  He also has a tremor of the left hand.  General appearance: no acute distress, increased work of breathing is present.   Lymphatic: No lymphadenopathy about the head, neck, axilla. Eyes: No conjunctival inflammation or lid edema is present. There is no scleral icterus. Ears:  External ear exam shows no significant lesions or deformities.   Nose:  External nasal examination shows no deformity or inflammation. Nasal mucosa are pink and moist without lesions, exudates Neck:  No thyromegaly, masses, tenderness noted.    Heart:  No gallop, murmur, click, rub .  Lungs:  without wheezes, rhonchi, rales, rubs. Abdomen: Bowel sounds are normal. Abdomen is soft and nontender with no organomegaly, hernias, masses. GU: Deferred  Extremities:  No cyanosis, clubbing, edema  Neurologic exam : Balance, Rhomberg, finger to nose testing could not be completed due to clinical state Skin: Warm & dry w/o tenting. No significant lesions or rash.  See summary under each active problem in the Problem List with associated  updated therapeutic plan

## 2020-10-17 NOTE — Assessment & Plan Note (Signed)
He does exhibit limb atrophy of the lower extremities; but strength and opposition was good on exam 2/1.

## 2020-10-18 DIAGNOSIS — D649 Anemia, unspecified: Secondary | ICD-10-CM | POA: Diagnosis not present

## 2020-10-18 DIAGNOSIS — I10 Essential (primary) hypertension: Secondary | ICD-10-CM | POA: Diagnosis not present

## 2020-10-18 DIAGNOSIS — D518 Other vitamin B12 deficiency anemias: Secondary | ICD-10-CM | POA: Diagnosis not present

## 2020-10-18 LAB — TSH: TSH: 4.33 (ref 0.41–5.90)

## 2020-10-18 LAB — VITAMIN B12: Vitamin B-12: 766

## 2020-10-20 ENCOUNTER — Non-Acute Institutional Stay (SKILLED_NURSING_FACILITY): Payer: Medicare Other | Admitting: Adult Health

## 2020-10-20 ENCOUNTER — Encounter: Payer: Self-pay | Admitting: Adult Health

## 2020-10-20 DIAGNOSIS — N400 Enlarged prostate without lower urinary tract symptoms: Secondary | ICD-10-CM

## 2020-10-20 DIAGNOSIS — F0390 Unspecified dementia without behavioral disturbance: Secondary | ICD-10-CM

## 2020-10-20 DIAGNOSIS — J431 Panlobular emphysema: Secondary | ICD-10-CM | POA: Diagnosis not present

## 2020-10-20 DIAGNOSIS — I4891 Unspecified atrial fibrillation: Secondary | ICD-10-CM | POA: Diagnosis not present

## 2020-10-20 DIAGNOSIS — I25119 Atherosclerotic heart disease of native coronary artery with unspecified angina pectoris: Secondary | ICD-10-CM

## 2020-10-20 DIAGNOSIS — E118 Type 2 diabetes mellitus with unspecified complications: Secondary | ICD-10-CM

## 2020-10-20 NOTE — Progress Notes (Signed)
Location:  [facility] Room Number: 115-A Place of Service:  SNF (31) Provider:  Durenda Age, DNP, FNP-BC  Patient Care Team: [name], MD as PCP - General  Extended Emergency Contact Information Primary Emergency Contact: [place] Phone: [phone] Mobile Phone: [phone] Relation: Son Secondary Emergency Contact: Dorothey Baseman Mobile Phone: [phone] Relation: Daughter  Code Status:  DNR  Goals of care: Advanced Directive information Advanced Directives 10/20/2020  Does Patient Have a Medical Advance Directive? Yes  Type of Advance Directive Living will;Healthcare Power of Attorney  Does patient want to make changes to medical advance directive? No - Patient declined  Copy of Outlook in Chart? Yes - validated most recent copy scanned in chart (See row information)  Would patient like information on creating a medical advance directive? -  Pre-existing out of facility DNR order (yellow form or pink MOST form) -     Chief Complaint  Patient presents with  . Acute Visit    Patient seen for a routine short-term rehabilitation visit    HPI:  Pt is an 87 y.o. [place] [name] seen today for a routine short-term rehabilitation visit. He has a PMH of COPD, CAD S/P CABG, hypertension, hyperlipidemia and tobacco use. Resident was seen in his room  today. He verbalized that he does not know what he is doing at [place]. He is usually heard screaming. He takes Sertraline 50 mg daily for depression. He scored 3/15 on BIMS. He has diagnosis of dementia and currently taking Donepezil 10 mg at bedtime and Memantine.  No reported SOB nor wheezing. He takes Breo Ellipta 100-25 mcg INH 1 puff into lungs daily and Albuterol HFA 90 mcg inhaler 2 puffs every 6 hours PRN.   Past Medical History:  Diagnosis Date  . Aorto-iliac atherosclerosis ([place]) [place]/2016   by xray  . Barrett's esophagus    EGD  2012, rpt 2014, longterm PPI  . Chronic idiopathic constipation 07/17/2010  . Chronic obstructive pulmonary disease (COPD) (HCC)    spirometry with moderate COPD  . Coronary atherosclerosis of artery bypass graft   . Diabetes mellitus type II ~2008  . Gastrointestinal ulcer 04/2011   with hematemesis s/p EGD by [facility]  . GERD (gastroesophageal reflux disease)   . Hearing loss   . HLD (hyperlipidemia)   . HNP (herniated nucleus pulposus), lumbar 2013   with compression of L5 nerve root and foot drop, also with lumbar DDD s/p surgery  . Hypertrophy of prostate without urinary obstruction and other lower urinary tract symptoms (LUTS)   . Memory loss   . Paroxysmal SVT (supraventricular tachycardia) ([place])    a. 3/14 => converted in ED with adenosine to AFib => NSR;  b. Echo 4/14:  Mild LVH, mild FBSH, EF 55-60%, Gr 1 DD  . Prostate nodule ~2009   Left-benign s/p eval Uro WNL ([place])  . Thrombocytopenia ([place])   . Tobacco use disorder    Past Surgical History:  Procedure Laterality Date  . COLONOSCOPY  03/2009   small hemorrhoids, 2 polyps (hyperplastic and adenomatous), rpt 5 yrs (Dr. Barron Schmid) - pt declined rpt scheduling  . COLONOSCOPY  10/2014   2 hyperplastic polyp, hemmorhoids ([place])  . CORONARY ARTERY BYPASS GRAFT  1999   Dr. Roxy Manns  . ESOPHAGOGASTRODUODENOSCOPY  05/16/2011   after hematemesis, no lesions found, consistent with barrett's esophagus rpt 2 yrs  . ESOPHAGOGASTRODUODENOSCOPY  06/2013   barrett's, small HH, recheck 2-3 yrs ([place])  .  HEMORRHOID SURGERY    . LUMBAR LAMINECTOMY/DECOMPRESSION MICRODISCECTOMY  01/31/2012   Procedure: LUMBAR LAMINECTOMY/DECOMPRESSION MICRODISCECTOMY 1 LEVEL;  Surgeon: Winfield Cunas, MD;  Location: [place] [facility];  Service: Neurosurgery;  Laterality: Left;  LEFT Lumbar Four-Five Diskectomy  . NOSE SURGERY    . ROTATOR CUFF REPAIR  04/2010   right (but has bilat) Dr. Latanya Maudlin ([facility])    Allergies  Allergen Reactions  . Codeine      REACTION: vomiting  . Cyclobenzaprine Hcl     REACTION: vomiting  . Hydrocodone Nausea Only and Other (See Comments)    "crazy"  . Tramadol Other (See Comments)    woozy  . Valium Other (See Comments)    sedation    Outpatient Encounter Medications as of 10/20/2020  Medication Sig  . albuterol (VENTOLIN HFA) 108 (90 Base) MCG/ACT inhaler Inhale 2 puffs into the lungs every 6 (six) hours as needed for wheezing or shortness of breath.  [name] apixaban (ELIQUIS) 2.5 MG TABS tablet Take 1 tablet (2.5 mg total) by mouth 2 (two) times daily.  [name] donepezil (ARICEPT) 10 MG tablet Take 1 tablet (10 mg total) by mouth at bedtime.  . fluticasone furoate-vilanterol (BREO ELLIPTA) 100-25 MCG/INH AEPB Inhale 1 puff into the lungs daily. For COPD  . isosorbide mononitrate (IMDUR) 30 MG 24 hr tablet Take 1 tablet (30 mg total) by mouth daily.  . magnesium hydroxide (MILK OF MAGNESIA) 400 MG/5ML suspension Take 15 mLs by mouth daily as needed for moderate constipation.  . melatonin 5 MG TABS Take 5 mg by mouth at bedtime. For Insomnia  . memantine (NAMENDA) 10 MG tablet Take 1 tablet (10 mg total) by mouth 2 (two) times daily.  . metFORMIN (GLUCOPHAGE) 500 MG tablet Take 1 tablet (500 mg total) by mouth daily with breakfast.  . metoprolol tartrate (LOPRESSOR) 25 MG tablet TAKE ONE-HALF TABLET BY  MOUTH TWICE DAILY  . nicotine (NICODERM CQ - DOSED IN MG/24 HOURS) 21 mg/24hr patch Place 21 mg onto the skin daily.  . NON FORMULARY Med Pass 120 m l po three times a day for nutrition and to pervent wt loss  . omeprazole (PRILOSEC) 40 MG capsule TAKE 1 CAPSULE BY MOUTH  DAILY  . OXYGEN Inhale 2 L into the lungs continuous.  . sertraline (ZOLOFT) 50 MG tablet TAKE 1 TABLET BY MOUTH  DAILY  . simvastatin (ZOCOR) 40 MG tablet TAKE 1 TABLET BY MOUTH AT  BEDTIME  . tamsulosin (FLOMAX) 0.4 MG CAPS capsule Take 1 capsule (0.4 mg total) by mouth daily.  . vitamin B-[place] (CYANOCOBALAMIN) 500 MCG tablet Take 1 tablet (500 mcg  total) by mouth every Monday, Wednesday, and Friday.   No facility-administered encounter medications on file as of 10/20/2020.    Review of Systems  Unable to obtain due to dementia    Immunization History  Administered Date(s) Administered  . Fluad Quad(high Dose 65+) 08/02/2019  . Influenza Whole 06/20/2010  . Influenza, High Dose Seasonal PF 06/02/2015, 07/04/2017, 06/15/2018  . Influenza, Seasonal, Injecte, Preservative Fre 06/16/2014  . Influenza-Unspecified 07/17/2016  . Pneumococcal Polysaccharide-23 09/17/2011  . Td 07/01/1999, 08/14/2010   Pertinent  Health Maintenance Due  Topic Date Due  . OPHTHALMOLOGY EXAM  06/16/2012  . COLONOSCOPY (Pts 45-68yrs Insurance coverage will need to be confirmed)  11/10/2019  . INFLUENZA VACCINE  04/16/2020  . URINE MICROALBUMIN  07/25/2020  . PNA vac Low Risk Adult (2 of 2 - PCV13) 12/14/2025 (Originally 09/16/2012)  . FOOT EXAM  01/23/2021  . HEMOGLOBIN A1C  03/12/2021   Fall Risk  04/10/2020 08/02/2019 07/07/2018 10/15/2017 10/01/2016  Falls in the past year? 0 1 No No No  Number falls in past yr: - 0 - - -  Injury with Fall? - 0 - - -     Vitals:   10/20/20 1523  BP: 123/64  Pulse: 97  Resp: 17  Temp: (!) 97 F (36.1 C)  TempSrc: Oral  Weight: 146 lb [place].8 oz (66.6 kg)  Height: 5\' 8"  (1.727 m)   Body mass index is 22.32 kg/m.  Physical Exam  GENERAL APPEARANCE: Well nourished. In no acute distress. Normal body habitus SKIN:  Skin is warm and dry.  MOUTH and THROAT: Lips are without lesions. Oral mucosa is moist and without lesions. Tongue is normal in shape, size, and color and without lesions RESPIRATORY: Breathing is even & unlabored, BS CTAB CARDIAC: RRR, no murmur,no extra heart sounds, no edema GI: Abdomen soft, normal BS, no masses, no tenderness EXTREMITIES:  Able to move X 4 extremities  NEUROLOGICAL: There is no tremor. Speech is clear. Alert to self, disoriented to time and place. PSYCHIATRIC:  Affect and  behavior are appropriate  Labs reviewed: Recent Labs    [place]/27/21 0913 Troy/27/21 1035 Troy/28/21 0110 Troy/29/21 0103 [place]/30/21 0049 [place]/31/21 0103 09/16/20 [id number] 09/16/20 0945 09/17/20 0113 09/18/20 0301 09/19/20 0114  NA  --    < > 142 140 141 145  --    < > 137 140 137  K  --    < > 4.8 4.9 4.8 5.1  --    < > 4.6 4.6 4.6  CL  --    < > 112* 114* 112* 116*  --    < > 111 110 111  CO2  --    < > 20* 16* 17* 18*  --    < > 17* 20* 19*  GLUCOSE  --    < > 117* 113* 113* 138*  --    < > 128* 127* 129*  BUN  --    < > 61* 51* 60* 63*  --    < > 42* 41* 37*  CREATININE  --    < > 1.99* 1.57* 1.85* 1.88*  --    < > 1.54* 1.65* 1.55*  CALCIUM  --    < > 8.5* 8.4* 8.6* 8.7*  --    < > 8.1* 8.3* 8.2*  MG 2.1  --  2.1 2.1 2.2 2.3 2.1  --   --   --   --   PHOS 4.1  --  3.1 2.4*  --   --   --   --   --   --   --    < > = values in this interval not displayed.   Recent Labs    09/17/20 0113 09/18/20 0301 09/19/20 0114  AST 32 29 25  ALT 37 38 35  ALKPHOS 80 94 88  BILITOT 0.8 0.9 1.0  PROT 5.6* 5.8* 5.6*  ALBUMIN 2.4* 2.4* 2.3*   Recent Labs    [place]/29/21 0103 [place]/30/21 0049 Troy/31/21 0103 09/16/20 0733 09/17/20 0113 09/18/20 0301 09/19/20 0114  WBC 7.6 7.8 7.4   < > 10.3 11.3* 11.9*  NEUTROABS 6.6 6.6 6.2  --   --   --   --   HGB Troy.1* Troy.0* 11.6*   < > 9.9* 11.0* 10.5*  HCT 36.5* 36.7* 35.1*   < > 29.5* 33.6* 31.4*  MCV 92.2 92.4 91.4   < > 89.7 92.1 91.5  PLT 85* 107* 104*   < > 142* 171 196   < > = values in this interval not displayed.   Lab Results  Component Value Date   TSH 4.33 10/18/2020   Lab Results  Component Value Date   HGBA1C 6.1 (H) [place]/27/2021   Lab Results  Component Value Date   CHOL 141 07/26/2019   HDL 35.80 (L) 07/26/2019   LDLCALC 82 07/26/2019   LDLDIRECT 74.0 [place]/05/2015   TRIG 118.0 07/26/2019   CHOLHDL 4 07/26/2019    Assessment/Plan  1. Panlobular emphysema (HCC) -  No SOB/wheezing, continue  PRN Albuterol and Breo Ellipta  2. Atrial  fibrillation with RVR (HCC) -  Rate-controlled, continue Eliquis for anticoagulation  3. Diabetes mellitus type 2 with complications (HCC) -  CBGs ranging from 101 to 185 -  CBGs stable, continue Metformin and CBG checks  4. Benign prostatic hyperplasia, unspecified whether lower urinary tract symptoms present -  Stable, continue Tamsulosin  5. Coronary artery disease involving native coronary artery of native heart with angina pectoris (HCC) -  S/P CABG -    No chest pains, continue Isosorbide MN ER, Simvastatin and Eliquis  6. Dementia with behavioral disturbance, unspecified dementia type ([place]) -   BIMS score 3/15, ranging in severe cognitive impairment -   Continue Memantine and Donepezil      Family/ staff Communication:  Discussed plan of care with charge nurse.  Labs/tests ordered:  None  Goals of care:   Short-term care    Durenda Age, DNP, MSN, FNP-BC [facility] [phone] (Monday-Friday 8:00 a.m. - 5:00 p.m.) [phone] (after hours)

## 2020-10-21 DIAGNOSIS — I129 Hypertensive chronic kidney disease with stage 1 through stage 4 chronic kidney disease, or unspecified chronic kidney disease: Secondary | ICD-10-CM | POA: Diagnosis not present

## 2020-10-21 DIAGNOSIS — N390 Urinary tract infection, site not specified: Secondary | ICD-10-CM | POA: Diagnosis not present

## 2020-10-21 DIAGNOSIS — N179 Acute kidney failure, unspecified: Secondary | ICD-10-CM | POA: Diagnosis not present

## 2020-10-21 DIAGNOSIS — N1831 Chronic kidney disease, stage 3a: Secondary | ICD-10-CM | POA: Diagnosis not present

## 2020-11-05 ENCOUNTER — Telehealth: Payer: Self-pay | Admitting: Family Medicine

## 2020-11-06 NOTE — Telephone Encounter (Signed)
E-scribed refill.  Plz r/s wellness, lab and cpe visits.

## 2020-11-06 NOTE — Telephone Encounter (Signed)
Noted.  FYI to Dr. G. 

## 2020-11-06 NOTE — Telephone Encounter (Signed)
Patient is currently at [facility] and his son stated that when he leaves there he will be going into a [place] facility permanently. EM

## 2020-11-07 ENCOUNTER — Emergency Department (HOSPITAL_COMMUNITY)
Admission: EM | Admit: 2020-11-07 | Discharge: 2020-11-07 | Disposition: A | Discharge status: Home / Self Care | Payer: Medicare Other | Attending: Emergency Medicine | Admitting: Emergency Medicine

## 2020-11-07 ENCOUNTER — Other Ambulatory Visit: Payer: Self-pay | Admitting: Family Medicine

## 2020-11-07 ENCOUNTER — Telehealth: Payer: Self-pay

## 2020-11-07 ENCOUNTER — Encounter (HOSPITAL_COMMUNITY): Payer: Self-pay | Admitting: Emergency Medicine

## 2020-11-07 ENCOUNTER — Other Ambulatory Visit: Payer: Self-pay

## 2020-11-07 DIAGNOSIS — I130 Hypertensive heart and chronic kidney disease with heart failure and stage 1 through stage 4 chronic kidney disease, or unspecified chronic kidney disease: Secondary | ICD-10-CM | POA: Diagnosis not present

## 2020-11-07 DIAGNOSIS — W19XXXA Unspecified fall, initial encounter: Secondary | ICD-10-CM | POA: Insufficient documentation

## 2020-11-07 DIAGNOSIS — Z7901 Long term (current) use of anticoagulants: Secondary | ICD-10-CM | POA: Insufficient documentation

## 2020-11-07 DIAGNOSIS — Z7952 Long term (current) use of systemic steroids: Secondary | ICD-10-CM | POA: Diagnosis not present

## 2020-11-07 DIAGNOSIS — Z8616 Personal history of COVID-19: Secondary | ICD-10-CM | POA: Diagnosis not present

## 2020-11-07 DIAGNOSIS — N183 Chronic kidney disease, stage 3 unspecified: Secondary | ICD-10-CM | POA: Insufficient documentation

## 2020-11-07 DIAGNOSIS — I5032 Chronic diastolic (congestive) heart failure: Secondary | ICD-10-CM | POA: Insufficient documentation

## 2020-11-07 DIAGNOSIS — R6889 Other general symptoms and signs: Secondary | ICD-10-CM | POA: Diagnosis not present

## 2020-11-07 DIAGNOSIS — I25810 Atherosclerosis of coronary artery bypass graft(s) without angina pectoris: Secondary | ICD-10-CM | POA: Diagnosis not present

## 2020-11-07 DIAGNOSIS — I4891 Unspecified atrial fibrillation: Secondary | ICD-10-CM | POA: Insufficient documentation

## 2020-11-07 DIAGNOSIS — S51012A Laceration without foreign body of left elbow, initial encounter: Secondary | ICD-10-CM | POA: Diagnosis not present

## 2020-11-07 DIAGNOSIS — F17210 Nicotine dependence, cigarettes, uncomplicated: Secondary | ICD-10-CM | POA: Diagnosis not present

## 2020-11-07 DIAGNOSIS — S81811A Laceration without foreign body, right lower leg, initial encounter: Secondary | ICD-10-CM | POA: Diagnosis not present

## 2020-11-07 DIAGNOSIS — F0390 Unspecified dementia without behavioral disturbance: Secondary | ICD-10-CM | POA: Insufficient documentation

## 2020-11-07 DIAGNOSIS — Z79899 Other long term (current) drug therapy: Secondary | ICD-10-CM | POA: Insufficient documentation

## 2020-11-07 DIAGNOSIS — S51011A Laceration without foreign body of right elbow, initial encounter: Secondary | ICD-10-CM | POA: Insufficient documentation

## 2020-11-07 DIAGNOSIS — I2510 Atherosclerotic heart disease of native coronary artery without angina pectoris: Secondary | ICD-10-CM | POA: Insufficient documentation

## 2020-11-07 DIAGNOSIS — S59901A Unspecified injury of right elbow, initial encounter: Secondary | ICD-10-CM | POA: Diagnosis present

## 2020-11-07 DIAGNOSIS — E1122 Type 2 diabetes mellitus with diabetic chronic kidney disease: Secondary | ICD-10-CM | POA: Diagnosis not present

## 2020-11-07 DIAGNOSIS — Z743 Need for continuous supervision: Secondary | ICD-10-CM | POA: Diagnosis not present

## 2020-11-07 DIAGNOSIS — Z7984 Long term (current) use of oral hypoglycemic drugs: Secondary | ICD-10-CM | POA: Diagnosis not present

## 2020-11-07 DIAGNOSIS — S81812A Laceration without foreign body, left lower leg, initial encounter: Secondary | ICD-10-CM | POA: Insufficient documentation

## 2020-11-07 DIAGNOSIS — Z7401 Bed confinement status: Secondary | ICD-10-CM | POA: Diagnosis not present

## 2020-11-07 DIAGNOSIS — R5381 Other malaise: Secondary | ICD-10-CM | POA: Diagnosis not present

## 2020-11-07 DIAGNOSIS — E1169 Type 2 diabetes mellitus with other specified complication: Secondary | ICD-10-CM | POA: Diagnosis not present

## 2020-11-07 DIAGNOSIS — M2550 Pain in unspecified joint: Secondary | ICD-10-CM | POA: Diagnosis not present

## 2020-11-07 DIAGNOSIS — R404 Transient alteration of awareness: Secondary | ICD-10-CM | POA: Diagnosis not present

## 2020-11-07 DIAGNOSIS — J441 Chronic obstructive pulmonary disease with (acute) exacerbation: Secondary | ICD-10-CM | POA: Diagnosis not present

## 2020-11-07 NOTE — ED Triage Notes (Signed)
Patient arrived by [facility] from [place]. Patient had fall today and arrived with skin tear to right elbow, dressing applied to same prior to arrival. No complaints

## 2020-11-07 NOTE — ED Notes (Signed)
NS called [facility] tto get pt transported as he is confused and would be better served in a familiar environment

## 2020-11-07 NOTE — Telephone Encounter (Signed)
Spoke with son.

## 2020-11-07 NOTE — ED Notes (Signed)
Patient verbalizes understanding of discharge instructions. Opportunity for questioning and answers were provided. Armband removed by staff, pt discharged from ED.  

## 2020-11-07 NOTE — Discharge Instructions (Signed)
Follow up with your doctor.  Return if you want to be reevaluated.

## 2020-11-07 NOTE — ED Provider Notes (Signed)
[facility] EMERGENCY DEPARTMENT Provider Note   CSN: [id number] Arrival date & time: 11/07/20  1410     History No chief complaint on file.   Troy [name] is a 87 y.o. male.  86 yo M with a chief complaints of a fall.  Patient denies any history of fall.  States he has no pain.  Denies head injury denies loss consciousness denies neck pain or back pain.  He denies extremity pain.  Level 5 caveat dementia.        Past Medical History:  Diagnosis Date  . Aorto-iliac atherosclerosis ([place]) 08/2015   by xray  . Barrett's esophagus    EGD 2012, rpt 2014, longterm PPI  . Chronic idiopathic constipation 07/17/2010  . Chronic obstructive pulmonary disease (COPD) (HCC)    spirometry with moderate COPD  . Coronary atherosclerosis of artery bypass graft   . Diabetes mellitus type II ~2008  . Gastrointestinal ulcer 04/2011   with hematemesis s/p EGD by [facility]  . GERD (gastroesophageal reflux disease)   . Hearing loss   . HLD (hyperlipidemia)   . HNP (herniated nucleus pulposus), lumbar 2013   with compression of L5 nerve root and foot drop, also with lumbar DDD s/p surgery  . Hypertrophy of prostate without urinary obstruction and other lower urinary tract symptoms (LUTS)   . Memory loss   . Paroxysmal SVT (supraventricular tachycardia) ([place])    a. 3/14 => converted in ED with adenosine to AFib => NSR;  b. Echo 4/14:  Mild LVH, mild FBSH, EF 55-60%, Gr 1 DD  . Prostate nodule ~2009   Left-benign s/p eval Uro WNL ([place])  . Thrombocytopenia ([place])   . Tobacco use disorder     Patient Active Problem List   Diagnosis Date Noted  . CKD (chronic kidney disease), stage III ([place]) 10/17/2020  . Unspecified protein-calorie malnutrition ([place]) 10/17/2020  . Atrial fibrillation with RVR ([place]) 09/21/2020  . Dysphagia 09/21/2020  . Severe hypoxemia 09/10/2020  . Acute respiratory disease due to COVID-19 virus 09/10/2020  . Acute respiratory failure with hypoxemia  ([place]) 09/10/2020  . AKI (acute kidney injury) ([place]) 09/10/2020  . Chronic diastolic CHF (congestive heart failure) ([place]) 09/10/2020  . Cerebral vascular disease 04/10/2020  . Diabetic cataract of both eyes ([place]) 01/24/2020  . Vitamin B12 deficiency 08/03/2019  . History of stroke 08/03/2019  . Pseudodementia 10/01/2018  . Sensorineural hearing loss (SNHL) of both ears 12/31/2017  . [place] intertrigo 12/22/2017  . Left serous otitis media 07/04/2017  . Hypertension associated with diabetes ([place]) 05/22/2017  . Eustachian tube dysfunction, left 04/04/2017  . Dry skin dermatitis 12/02/2016  . Health maintenance examination 10/08/2016  . Advanced care planning/counseling discussion 10/08/2016  . Thyromegaly 10/08/2016  . Anemia 10/08/2016  . Aorto-iliac atherosclerosis ([place]) 08/17/2015  . Bleeding hemorrhoid 06/13/2015  . Dementia without behavioral disturbance ([place]) 06/13/2015  . Orthostatic dizziness 08/01/2014  . Paresthesia of foot, bilateral 08/01/2014  . Supraventricular tachycardia ([place]) 11/24/2012  . CAD (coronary artery disease) 11/24/2012  . COPD exacerbation ([place]) 11/10/2012  . Lumbosacral radiculopathy at L5 12/23/2011  . Thrombocytopenia ([place])   . Barrett's esophagus   . Skin lesion of back 09/16/2011  . PUD (peptic ulcer disease) 05/16/2011  . Medicare annual wellness visit, subsequent 04/30/2011  . COPD (chronic obstructive pulmonary disease) ([place]) 12/26/2010  . Diabetes mellitus type 2 with complications ([place]) 42/70/6237  . Chronic idiopathic constipation 07/17/2010  . Hyperlipidemia associated with type 2 diabetes mellitus ([place]) 05/13/2009  .  Tobacco use 05/13/2009    Past Surgical History:  Procedure Laterality Date  . COLONOSCOPY  03/2009   small hemorrhoids, 2 polyps (hyperplastic and adenomatous), rpt 5 yrs (Dr. Barron Schmid) - pt declined rpt scheduling  . COLONOSCOPY  10/2014   2 hyperplastic polyp, hemmorhoids ([place])  . CORONARY ARTERY BYPASS GRAFT   1999   Dr. Roxy Manns  . ESOPHAGOGASTRODUODENOSCOPY  05/16/2011   after hematemesis, no lesions found, consistent with barrett's esophagus rpt 2 yrs  . ESOPHAGOGASTRODUODENOSCOPY  06/2013   barrett's, small HH, recheck 2-3 yrs ([place])  . HEMORRHOID SURGERY    . LUMBAR LAMINECTOMY/DECOMPRESSION MICRODISCECTOMY  01/31/2012   Procedure: LUMBAR LAMINECTOMY/DECOMPRESSION MICRODISCECTOMY 1 LEVEL;  Surgeon: Winfield Cunas, MD;  Location: [facility]URO ORS;  Service: Neurosurgery;  Laterality: Left;  LEFT Lumbar Four-Five Diskectomy  . NOSE SURGERY    . ROTATOR CUFF REPAIR  04/2010   right (but has bilat) Dr. Latanya Maudlin ([facility])       Family History  Problem Relation Age of Onset  . Cancer Father 55       colon  . Memory loss Mother     Social History   Tobacco Use  . Smoking status: Current Every Day Smoker    Packs/day: 1.00    Years: 60.00    Pack years: 60.00    Types: Cigarettes    Start date: 09/16/1952  . Smokeless tobacco: Current User    Types: Snuff  . Tobacco comment: < 1 ppd; does dip sometimes  Vaping Use  . Vaping Use: Never used  Substance Use Topics  . Alcohol use: Yes  . Drug use: No    Home Medications Prior to Admission medications   Medication Sig Start Date End Date Taking? Authorizing Provider  albuterol (VENTOLIN HFA) 108 (90 Base) MCG/ACT inhaler Inhale 2 puffs into the lungs every 6 (six) hours as needed for wheezing or shortness of breath. 08/06/19   [name], MD  apixaban (ELIQUIS) 2.5 MG TABS tablet Take 1 tablet (2.5 mg total) by mouth 2 (two) times daily. 09/19/20   Thurnell Lose, MD  donepezil (ARICEPT) 10 MG tablet Take 1 tablet (10 mg total) by mouth at bedtime. 04/10/20   Marcial Pacas, MD  fluticasone furoate-vilanterol (BREO ELLIPTA) 100-25 MCG/INH AEPB Inhale 1 puff into the lungs daily. For COPD    [provider]  isosorbide mononitrate (IMDUR) 30 MG 24 hr tablet Take 1 tablet (30 mg total) by mouth daily. 01/24/20   [name], MD  magnesium hydroxide (MILK OF MAGNESIA) 400 MG/5ML suspension Take 15 mLs by mouth daily as needed for moderate constipation. 11/01/14   [name], MD  melatonin 5 MG TABS Take 5 mg by mouth at bedtime. For Insomnia    [provider]  memantine (NAMENDA) 10 MG tablet Take 1 tablet (10 mg total) by mouth 2 (two) times daily. 04/10/20   Marcial Pacas, MD  metFORMIN (GLUCOPHAGE) 500 MG tablet Take 1 tablet (500 mg total) by mouth daily with breakfast. 01/24/20   [name], MD  metoprolol tartrate (LOPRESSOR) 25 MG tablet TAKE ONE-HALF TABLET BY  MOUTH TWICE DAILY 09/01/20   [name], MD  nicotine (NICODERM CQ - DOSED IN MG/24 HOURS) 21 mg/24hr patch Place 21 mg onto the skin daily.    [provider]  NON FORMULARY Med Pass 120 m l po three times a day for nutrition and to pervent wt loss    [provider]  omeprazole (PRILOSEC) 40  MG capsule TAKE 1 CAPSULE BY MOUTH  DAILY 09/01/20   [name], MD  OXYGEN Inhale 2 L into the lungs continuous.    [provider]  sertraline (ZOLOFT) 50 MG tablet TAKE 1 TABLET BY MOUTH  DAILY 08/18/20   [name], MD  simvastatin (ZOCOR) 40 MG tablet TAKE 1 TABLET BY MOUTH AT  BEDTIME 11/06/20   [name], MD  tamsulosin (FLOMAX) 0.4 MG CAPS capsule Take 1 capsule (0.4 mg total) by mouth daily. 09/20/20   Thurnell Lose, MD  vitamin B-12 (CYANOCOBALAMIN) 500 MCG tablet Take 1 tablet (500 mcg total) by mouth every Monday, Wednesday, and Friday. 01/28/20   [name], MD    Allergies    Codeine, Cyclobenzaprine hcl, Hydrocodone, Tramadol, and Valium  Review of Systems   Review of Systems  Unable to perform ROS: Dementia    Physical Exam Updated Vital Signs BP (!) 91/58   Pulse 95   Temp 97.7 F (36.5 C) (Oral)   SpO2 97%   Physical Exam Vitals and nursing note reviewed.  Constitutional:      Appearance: He is well-developed and well-nourished.  HENT:      Head: Normocephalic and atraumatic.  Eyes:     Extraocular Movements: EOM normal.     Pupils: Pupils are equal, round, and reactive to light.  Neck:     Vascular: No JVD.  Cardiovascular:     Rate and Rhythm: Normal rate and regular rhythm.     Heart sounds: No murmur heard. No friction rub. No gallop.   Pulmonary:     Effort: No respiratory distress.     Breath sounds: No wheezing.  Abdominal:     General: There is no distension.     Tenderness: There is no guarding or rebound.  Musculoskeletal:        General: Normal range of motion.     Cervical back: Normal range of motion and neck supple.     Comments: Multiple areas of various stages of healing skin tears to his shins and elbows.  No obvious lacerations.  No bony tenderness with palpation from head to toe.  Able to rotate his head without pain.  Skin:    Coloration: Skin is not pale.     Findings: No rash.  Neurological:     Mental Status: He is alert and oriented to person, place, and time.  Psychiatric:        Mood and Affect: Mood and affect normal.        Behavior: Behavior normal.     ED Results / Procedures / Treatments   Labs (all labs ordered are listed, but only abnormal results are displayed) Labs Reviewed - No data to display  EKG None  Radiology No results found.  Procedures Procedures   Medications Ordered in ED Medications - No data to display  ED Course  I have reviewed the triage vital signs and the nursing notes.  Pertinent labs & imaging results that were available during my care of the patient were reviewed by me and considered in my medical decision making (see chart for details).    MDM Rules/Calculators/A&P                          87 yo M with a chief complaints of a fall.  This was reported by the nursing home.  Patient with multiple areas of different stages of healing, likely falls frequently.  Patient without  any obvious head or neck injury.  He is declining any further work-up  for imaging here.  We will have him follow-up with his family doctor.  2:53 PM:  I have discussed the diagnosis/risks/treatment options with the patient and believe the pt to be eligible for discharge home to follow-up with PCP. We also discussed returning to the ED immediately if new or worsening sx occur. We discussed the sx which are most concerning (e.g., sudden worsening pain, fever, inability to tolerate by mouth) that necessitate immediate return. Medications administered to the patient during their visit and any new prescriptions provided to the patient are listed below.  Medications given during this visit Medications - No data to display   The patient appears reasonably screen and/or stabilized for discharge and I doubt any other medical condition or other [facility] requiring further screening, evaluation, or treatment in the ED at this time prior to discharge.   Final Clinical Impression(s) / ED Diagnoses Final diagnoses:  Fall, initial encounter    Rx / DC Orders ED Discharge Orders    None       Deno Etienne, DO 11/07/20 1453

## 2020-11-07 NOTE — Telephone Encounter (Signed)
Elenore Rota (DPR signed) pts son said that he wants to speak with Dr Darnell Level about his fathers condition and to verify there is communication between Dr Darnell Level and Dr Linna Darner at [facility]. Pt was seen at [facility] ED today; Elenore Rota said pt fell at nursing home earlier today and [facility] sent pt to ED for eval and imaging. Elenore Rota said pt has not returned to [facility] yet. Elenore Rota wants to discuss visit and to see when pt will need FU. Per ED note pt declined work up for imaging; I am putting this in note for Dr Darnell Level but I did not mention to pts son at this time and will wait for Dr G to discuss with pts son. Dr Darnell Level will call pts son,Donald at [phone]. Elenore Rota voiced understanding and appreciative.

## 2020-11-08 ENCOUNTER — Non-Acute Institutional Stay (SKILLED_NURSING_FACILITY): Payer: Medicare Other | Admitting: Adult Health

## 2020-11-08 ENCOUNTER — Encounter: Payer: Self-pay | Admitting: Adult Health

## 2020-11-08 DIAGNOSIS — E118 Type 2 diabetes mellitus with unspecified complications: Secondary | ICD-10-CM

## 2020-11-08 DIAGNOSIS — I25119 Atherosclerotic heart disease of native coronary artery with unspecified angina pectoris: Secondary | ICD-10-CM

## 2020-11-08 DIAGNOSIS — I4891 Unspecified atrial fibrillation: Secondary | ICD-10-CM | POA: Diagnosis not present

## 2020-11-08 DIAGNOSIS — W19XXXS Unspecified fall, sequela: Secondary | ICD-10-CM | POA: Diagnosis not present

## 2020-11-08 DIAGNOSIS — J431 Panlobular emphysema: Secondary | ICD-10-CM

## 2020-11-08 NOTE — Progress Notes (Signed)
Location:  [facility] Room Number: 115/A Place of Service:  SNF (31) Provider:  Durenda Age, DNP, FNP-BC  Patient Care Team: [name], MD as PCP - General  Extended Emergency Contact Information Primary Emergency Contact: [place] Phone: [phone] Mobile Phone: [phone] Relation: Son Secondary Emergency Contact: Dorothey Baseman Mobile Phone: [phone] Relation: Daughter  Code Status:  DNR  Goals of care: Advanced Directive information Advanced Directives 11/08/2020  Does Patient Have a Medical Advance Directive? Yes  Type of Paramedic of [place];Living will;Out of facility DNR (pink MOST or yellow form)  Does patient want to make changes to medical advance directive? No - Patient declined  Copy of [place] in Chart? Yes - validated most recent copy scanned in chart (See row information)  Would patient like information on creating a medical advance directive? -  Pre-existing out of facility DNR order (yellow form or pink MOST form) -     Chief Complaint  Patient presents with  . Acute Visit     Follow Up ED Visit    HPI:  Pt is a 87 y.o. male seen today for ED visit follow up. He is a short-term care resident of [facility] who was transferred to ED on 11/07/20 S/P fall. There was no obvious head or neck injury. He declined work-up or imaging. He was discharged back to facility same day, 11/07/20.  He was seen in his room today. He was asking "why am I here?" "where am I?" He denies having pain.  Past Medical History:  Diagnosis Date  . Aorto-iliac atherosclerosis ([place]) 08/2015   by xray  . Barrett's esophagus    EGD 2012, rpt 2014, longterm PPI  . Chronic idiopathic constipation 07/17/2010  . Chronic obstructive pulmonary disease (COPD) (HCC)    spirometry with moderate COPD  . Coronary atherosclerosis of artery  bypass graft   . Diabetes mellitus type II ~2008  . Gastrointestinal ulcer 04/2011   with hematemesis s/p EGD by [facility]  . GERD (gastroesophageal reflux disease)   . Hearing loss   . HLD (hyperlipidemia)   . HNP (herniated nucleus pulposus), lumbar 2013   with compression of L5 nerve root and foot drop, also with lumbar DDD s/p surgery  . Hypertrophy of prostate without urinary obstruction and other lower urinary tract symptoms (LUTS)   . Memory loss   . Paroxysmal SVT (supraventricular tachycardia) ([place])    a. 3/14 => converted in ED with adenosine to AFib => NSR;  b. Echo 4/14:  Mild LVH, mild FBSH, EF 55-60%, Gr 1 DD  . Prostate nodule ~2009   Left-benign s/p eval Uro WNL ([place])  . Thrombocytopenia ([place])   . Tobacco use disorder    Past Surgical History:  Procedure Laterality Date  . COLONOSCOPY  03/2009   small hemorrhoids, 2 polyps (hyperplastic and adenomatous), rpt 5 yrs (Dr. Barron Schmid) - pt declined rpt scheduling  . COLONOSCOPY  10/2014   2 hyperplastic polyp, hemmorhoids ([place])  . CORONARY ARTERY BYPASS GRAFT  1999   Dr. Roxy Manns  . ESOPHAGOGASTRODUODENOSCOPY  05/16/2011   after hematemesis, no lesions found, consistent with barrett's esophagus rpt 2 yrs  . ESOPHAGOGASTRODUODENOSCOPY  06/2013   barrett's, small HH, recheck 2-3 yrs ([place])  . HEMORRHOID SURGERY    . LUMBAR LAMINECTOMY/DECOMPRESSION MICRODISCECTOMY  01/31/2012   Procedure: LUMBAR LAMINECTOMY/DECOMPRESSION MICRODISCECTOMY 1 LEVEL;  Surgeon: Winfield Cunas, MD;  Location: [facility]S;  Service: Neurosurgery;  Laterality: Left;  LEFT Lumbar Four-Five Diskectomy  . NOSE SURGERY    . ROTATOR CUFF REPAIR  04/2010   right (but has bilat) Dr. Latanya Maudlin ([facility])    Allergies  Allergen Reactions  . Codeine     REACTION: vomiting  . Cyclobenzaprine Hcl     REACTION: vomiting  . Hydrocodone Nausea Only and Other (See Comments)    "crazy"  . Tramadol Other (See Comments)    woozy  . Valium Other (See  Comments)    sedation    Outpatient Encounter Medications as of 11/08/2020  Medication Sig  . albuterol (VENTOLIN HFA) 108 (90 Base) MCG/ACT inhaler Inhale 2 puffs into the lungs every 6 (six) hours as needed for wheezing or shortness of breath.  [name] apixaban (ELIQUIS) 2.5 MG TABS tablet Take 1 tablet (2.5 mg total) by mouth 2 (two) times daily.  [name] donepezil (ARICEPT) 10 MG tablet Take 1 tablet (10 mg total) by mouth at bedtime.  . fluticasone furoate-vilanterol (BREO ELLIPTA) 100-25 MCG/INH AEPB Inhale 1 puff into the lungs daily. For COPD  . isosorbide mononitrate (IMDUR) 30 MG 24 hr tablet Take 1 tablet (30 mg total) by mouth daily.  . magnesium hydroxide (MILK OF MAGNESIA) 400 MG/5ML suspension Take 15 mLs by mouth daily as needed for moderate constipation.  . melatonin 5 MG TABS Take 5 mg by mouth at bedtime. For Insomnia  . memantine (NAMENDA) 10 MG tablet Take 1 tablet (10 mg total) by mouth 2 (two) times daily.  . metFORMIN (GLUCOPHAGE) 500 MG tablet Take 1 tablet (500 mg total) by mouth daily with breakfast.  . metoprolol tartrate (LOPRESSOR) 25 MG tablet TAKE ONE-HALF TABLET BY  MOUTH TWICE DAILY  . nicotine (NICODERM CQ - DOSED IN MG/24 HOURS) 21 mg/24hr patch Place 21 mg onto the skin daily.  . NON FORMULARY Med Pass 120 m l po three times a day for nutrition and to pervent wt loss  . omeprazole (PRILOSEC) 40 MG capsule TAKE 1 CAPSULE BY MOUTH  DAILY  . OXYGEN Inhale 2 L into the lungs continuous.  . sertraline (ZOLOFT) 50 MG tablet TAKE 1 TABLET BY MOUTH  DAILY  . simvastatin (ZOCOR) 40 MG tablet TAKE 1 TABLET BY MOUTH AT  BEDTIME  . tamsulosin (FLOMAX) 0.4 MG CAPS capsule Take 1 capsule (0.4 mg total) by mouth daily.  . vitamin B-12 (CYANOCOBALAMIN) 500 MCG tablet Take 1 tablet (500 mcg total) by mouth every Monday, Wednesday, and Friday.   No facility-administered encounter medications on file as of 11/08/2020.    Review of Systems  Unable to obtain due to  dementia   Immunization History  Administered Date(s) Administered  . Fluad Quad(high Dose 65+) 08/02/2019  . Influenza Whole 06/20/2010  . Influenza, High Dose Seasonal PF 06/02/2015, 07/04/2017, 06/15/2018  . Influenza, Seasonal, Injecte, Preservative Fre 06/16/2014  . Influenza-Unspecified 07/17/2016  . Pneumococcal Polysaccharide-23 09/17/2011  . Td 07/01/1999, 08/14/2010   Pertinent  Health Maintenance Due  Topic Date Due  . OPHTHALMOLOGY EXAM  06/16/2012  . COLONOSCOPY (Pts 45-43yrs Insurance coverage will need to be confirmed)  11/10/2019  . INFLUENZA VACCINE  04/16/2020  . URINE MICROALBUMIN  07/25/2020  . PNA vac Low Risk Adult (2 of 2 - PCV13) 12/14/2025 (Originally 09/16/2012)  . FOOT EXAM  01/23/2021  . HEMOGLOBIN A1C  03/12/2021   Fall Risk  04/10/2020 08/02/2019 07/07/2018 10/15/2017 10/01/2016  Falls in the past year? 0 1 No No No  Number falls in  past yr: - 0 - - -  Injury with Fall? - 0 - - -     Vitals:   11/08/20 1150  BP: (!) 141/75  Pulse: 92  Resp: 18  Temp: (!) 96.6 F (35.9 C)  Weight: 146 lb 12.8 oz (66.6 kg)  Height: 5\' 8"  (1.727 m)   Body mass index is 22.32 kg/m.  Physical Exam  GENERAL APPEARANCE: Well nourished. In no acute distress. Normal body habitus SKIN:  Right elbow abrasions/skin tear MOUTH and THROAT: Lips are without lesions. Oral mucosa is moist and without lesions.  RESPIRATORY: Breathing is even & unlabored, BS CTAB CARDIAC: RRR, no murmur,no extra heart sounds, no edema GI: Abdomen soft, normal BS, no masses, no tenderness EXTREMITIES:  Able to move X 4 extremities. NEUROLOGICAL: There is no tremor. Speech is clear. Alert to self, disoriented to time and place. PSYCHIATRIC:  Affect and behavior are appropriate  Labs reviewed: Recent Labs    09/11/20 0913 09/11/20 1035 09/12/20 0110 09/13/20 0103 09/14/20 0049 09/15/20 0103 09/16/20 7035 09/16/20 0945 09/17/20 0113 09/18/20 0301 09/19/20 0114  NA  --    < > 142  140 141 145  --    < > 137 140 137  K  --    < > 4.8 4.9 4.8 5.1  --    < > 4.6 4.6 4.6  CL  --    < > 112* 114* 112* 116*  --    < > 111 110 111  CO2  --    < > 20* 16* 17* 18*  --    < > 17* 20* 19*  GLUCOSE  --    < > 117* 113* 113* 138*  --    < > 128* 127* 129*  BUN  --    < > 61* 51* 60* 63*  --    < > 42* 41* 37*  CREATININE  --    < > 1.99* 1.57* 1.85* 1.88*  --    < > 1.54* 1.65* 1.55*  CALCIUM  --    < > 8.5* 8.4* 8.6* 8.7*  --    < > 8.1* 8.3* 8.2*  MG 2.1  --  2.1 2.1 2.2 2.3 2.1  --   --   --   --   PHOS 4.1  --  3.1 2.4*  --   --   --   --   --   --   --    < > = values in this interval not displayed.   Recent Labs    09/17/20 0113 09/18/20 0301 09/19/20 0114  AST 32 29 25  ALT 37 38 35  ALKPHOS 80 94 88  BILITOT 0.8 0.9 1.0  PROT 5.6* 5.8* 5.6*  ALBUMIN 2.4* 2.4* 2.3*   Recent Labs    09/13/20 0103 09/14/20 0049 09/15/20 0103 09/16/20 0733 09/17/20 0113 09/18/20 0301 09/19/20 0114  WBC 7.6 7.8 7.4   < > 10.3 11.3* 11.9*  NEUTROABS 6.6 6.6 6.2  --   --   --   --   HGB 12.1* 12.0* 11.6*   < > 9.9* 11.0* 10.5*  HCT 36.5* 36.7* 35.1*   < > 29.5* 33.6* 31.4*  MCV 92.2 92.4 91.4   < > 89.7 92.1 91.5  PLT 85* 107* 104*   < > 142* 171 196   < > = values in this interval not displayed.   Lab Results  Component Value Date   TSH 4.33 10/18/2020  Lab Results  Component Value Date   HGBA1C 6.1 (H) 09/11/2020   Lab Results  Component Value Date   CHOL 141 07/26/2019   HDL 35.80 (L) 07/26/2019   LDLCALC 82 07/26/2019   LDLDIRECT 74.0 08/25/2015   TRIG 118.0 07/26/2019   CHOLHDL 4 07/26/2019     Assessment/Plan  1. Fall, sequela -   no obvious head or neck injury. He declined work-up or imaging -    Fall precautions  2. Atrial fibrillation with RVR (HCC) -Rate controlled, continue metoprolol tartrate for rate control and Eliquis for anticoagulation  3. Panlobular emphysema (HCC) -   No wheezing/SOB, continue PRN albuterol and Breo Ellipta  INH  4. Coronary artery disease involving native coronary artery of native heart with angina pectoris ([place]) -    Denies chest pain, continue isosorbide MN ER & simvastatin  5. Diabetes mellitus type 2 with complications [facility]) Lab Results  Component Value Date   HGBA1C 6.1 (H) 09/11/2020   -CBGs stable, continue Metformin      Family/ staff Communication: Discussed plan of care with charge nurse.  Labs/tests ordered: None  Goals of care:   Short-term care   Durenda Age, DNP, MSN, FNP-BC [facility] and Adult [facility] [phone] (Monday-Friday 8:00 a.m. - 5:00 p.m.) [phone] (after hours)

## 2020-11-19 ENCOUNTER — Other Ambulatory Visit: Payer: Self-pay | Admitting: Family Medicine

## 2020-11-21 NOTE — Telephone Encounter (Signed)
Pharmacy requests refill on: Metoprolol Tartrate 25 mg & Omeprazole 40 mg   LAST REFILL: 09/01/2020 (Q-90, R-0) LAST OV: 01/24/2020 NEXT OV: Not Scheduled  PHARMACY: [facility]

## 2020-11-24 ENCOUNTER — Non-Acute Institutional Stay (SKILLED_NURSING_FACILITY): Payer: Medicare Other | Admitting: Adult Health

## 2020-11-24 ENCOUNTER — Encounter: Payer: Self-pay | Admitting: Adult Health

## 2020-11-24 DIAGNOSIS — R21 Rash and other nonspecific skin eruption: Secondary | ICD-10-CM

## 2020-11-24 NOTE — Progress Notes (Signed)
Location:  [place] Room Number: 115 A Place of Service:  SNF (31) Provider:  Durenda Age, DNP, FNP-BC  Patient Care Team: [name], MD as PCP - General  Extended Emergency Contact Information Primary Emergency Contact: [place] of [place] Phone: [phone] Mobile Phone: [phone] Relation: Son Secondary Emergency Contact: Dorothey Baseman Mobile Phone: [phone] Relation: Daughter  Code Status:    Goals of care: Advanced Directive information Advanced Directives 11/08/2020  Does Patient Have a Medical Advance Directive? Yes  Type of Paramedic of [place];Living will;Out of facility DNR (pink MOST or yellow form)  Does patient want to make changes to medical advance directive? No - Patient declined  Copy of [place] in Chart? Yes - validated most recent copy scanned in chart (See row information)  Would patient like information on creating a medical advance directive? -  Pre-existing out of facility DNR order (yellow form or pink MOST form) -     Chief Complaint  Patient presents with  . Acute Visit    Rashes    HPI:  Pt is a 87 y.o. male seen today for rashes on his trunk. He is a short-term care resident of [facility]. He has a PMH of COPD, CAD S/P CABG, hypertension, hyperlipidemia and tobacco use. Noted to have papules on his chest and back. He denies itching. No reported fever.  Past Medical History:  Diagnosis Date  . Aorto-iliac atherosclerosis ([place]) 08/2015   by xray  . Barrett's esophagus    EGD 2012, rpt 2014, longterm PPI  . Chronic idiopathic constipation 07/17/2010  . Chronic obstructive pulmonary disease (COPD) (HCC)    spirometry with moderate COPD  . Coronary atherosclerosis of artery bypass graft   . Diabetes mellitus type II ~2008  . Gastrointestinal ulcer 04/2011   with hematemesis s/p EGD by [facility]  . GERD  (gastroesophageal reflux disease)   . Hearing loss   . HLD (hyperlipidemia)   . HNP (herniated nucleus pulposus), lumbar 2013   with compression of L5 nerve root and foot drop, also with lumbar DDD s/p surgery  . Hypertrophy of prostate without urinary obstruction and other lower urinary tract symptoms (LUTS)   . Memory loss   . Paroxysmal SVT (supraventricular tachycardia) ([place])    a. 3/14 => converted in ED with adenosine to AFib => NSR;  b. Echo 4/14:  Mild LVH, mild FBSH, EF 55-60%, Gr 1 DD  . Prostate nodule ~2009   Left-benign s/p eval Uro WNL ([place])  . Thrombocytopenia ([place])   . Tobacco use disorder    Past Surgical History:  Procedure Laterality Date  . COLONOSCOPY  03/2009   small hemorrhoids, 2 polyps (hyperplastic and adenomatous), rpt 5 yrs (Dr. Barron Schmid) - pt declined rpt scheduling  . COLONOSCOPY  10/2014   2 hyperplastic polyp, hemmorhoids ([place])  . CORONARY ARTERY BYPASS GRAFT  1999   Dr. Roxy Manns  . ESOPHAGOGASTRODUODENOSCOPY  05/16/2011   after hematemesis, no lesions found, consistent with barrett's esophagus rpt 2 yrs  . ESOPHAGOGASTRODUODENOSCOPY  06/2013   barrett's, small HH, recheck 2-3 yrs ([place])  . HEMORRHOID SURGERY    . LUMBAR LAMINECTOMY/DECOMPRESSION MICRODISCECTOMY  01/31/2012   Procedure: LUMBAR LAMINECTOMY/DECOMPRESSION MICRODISCECTOMY 1 LEVEL;  Surgeon: Winfield Cunas, MD;  Location: [place] NEURO ORS;  Service: Neurosurgery;  Laterality: Left;  LEFT Lumbar Four-Five Diskectomy  . NOSE SURGERY    . ROTATOR CUFF REPAIR  04/2010   right (  but has bilat) Dr. Latanya Maudlin ([facility])    Allergies  Allergen Reactions  . Codeine     REACTION: vomiting  . Cyclobenzaprine Hcl     REACTION: vomiting  . Hydrocodone Nausea Only and Other (See Comments)    "crazy"  . Tramadol Other (See Comments)    woozy  . Valium Other (See Comments)    sedation    Outpatient Encounter Medications as of 11/24/2020  Medication Sig  . albuterol (VENTOLIN HFA) 108 (90  Base) MCG/ACT inhaler Inhale 2 puffs into the lungs every 6 (six) hours as needed for wheezing or shortness of breath.  [name] apixaban (ELIQUIS) 2.5 MG TABS tablet Take 1 tablet (2.5 mg total) by mouth 2 (two) times daily.  [name] donepezil (ARICEPT) 10 MG tablet Take 1 tablet (10 mg total) by mouth at bedtime.  . fluticasone furoate-vilanterol (BREO ELLIPTA) 100-25 MCG/INH AEPB Inhale 1 puff into the lungs daily. For COPD  . isosorbide mononitrate (IMDUR) 30 MG 24 hr tablet Take 1 tablet (30 mg total) by mouth daily.  . magnesium hydroxide (MILK OF MAGNESIA) 400 MG/5ML suspension Take 15 mLs by mouth daily as needed for moderate constipation.  . melatonin 5 MG TABS Take 5 mg by mouth at bedtime. For Insomnia  . memantine (NAMENDA) 10 MG tablet Take 1 tablet (10 mg total) by mouth 2 (two) times daily.  . metFORMIN (GLUCOPHAGE) 500 MG tablet Take 1 tablet (500 mg total) by mouth daily with breakfast.  . metoprolol tartrate (LOPRESSOR) 25 MG tablet TAKE ONE-HALF TABLET BY  MOUTH TWICE DAILY  . nicotine (NICODERM CQ - DOSED IN MG/24 HOURS) 21 mg/24hr patch Place 21 mg onto the skin daily.  . NON FORMULARY Med Pass 120 m l po three times a day for nutrition and to pervent wt loss  . omeprazole (PRILOSEC) 40 MG capsule TAKE 1 CAPSULE BY MOUTH  DAILY  . OXYGEN Inhale 2 L into the lungs continuous.  . sertraline (ZOLOFT) 50 MG tablet TAKE 1 TABLET BY MOUTH  DAILY  . simvastatin (ZOCOR) 40 MG tablet TAKE 1 TABLET BY MOUTH AT  BEDTIME  . tamsulosin (FLOMAX) 0.4 MG CAPS capsule Take 1 capsule (0.4 mg total) by mouth daily.  . vitamin B-12 (CYANOCOBALAMIN) 500 MCG tablet Take 1 tablet (500 mcg total) by mouth every Monday, Wednesday, and Friday.   No facility-administered encounter medications on file as of 11/24/2020.    Review of Systems  Unable to obtain due to dementia.    Immunization History  Administered Date(s) Administered  . Fluad Quad(high Dose 65+) 08/02/2019  . Influenza Whole 06/20/2010  .  Influenza, High Dose Seasonal PF 06/02/2015, 07/04/2017, 06/15/2018  . Influenza, Seasonal, Injecte, Preservative Fre 06/16/2014  . Influenza-Unspecified 07/17/2016  . Pneumococcal Polysaccharide-23 09/17/2011  . Td 07/01/1999, 08/14/2010   Pertinent  Health Maintenance Due  Topic Date Due  . OPHTHALMOLOGY EXAM  06/16/2012  . COLONOSCOPY (Pts 45-28yrs Insurance coverage will need to be confirmed)  11/10/2019  . INFLUENZA VACCINE  04/16/2020  . URINE MICROALBUMIN  07/25/2020  . PNA vac Low Risk Adult (2 of 2 - PCV13) 12/14/2025 (Originally 09/16/2012)  . FOOT EXAM  01/23/2021  . HEMOGLOBIN A1C  03/12/2021   Fall Risk  04/10/2020 08/02/2019 07/07/2018 10/15/2017 10/01/2016  Falls in the past year? 0 1 No No No  Number falls in past yr: - 0 - - -  Injury with Fall? - 0 - - -     Vitals:   11/24/20 1100  BP: (!) 127/56  Pulse: 74  Resp: 18  Temp: (!) 97.5 F (36.4 C)  Weight: 152 lb 12.8 oz (69.3 kg)  Height: 5\' 8"  (1.727 m)   Body mass index is 23.23 kg/m.  Physical Exam  GENERAL APPEARANCE: Well nourished. In no acute distress. Normal body habitus SKIN:  See HPI MOUTH and THROAT: Lips are without lesions. Oral mucosa is moist and without lesions.  RESPIRATORY: Breathing is even & unlabored, BS CTAB CARDIAC: RRR, no murmur,no extra heart sounds, no edema GI: Abdomen soft, normal BS, no masses, no tenderness EXTREMITIES:  Able to move X 4 extremities. NEUROLOGICAL: There is no tremor. Speech is clear. Alert to self, disoriented to time and place. PSYCHIATRIC:  Affect and behavior are appropriate  Labs reviewed: Recent Labs    09/11/20 0913 09/11/20 1035 09/12/20 0110 09/13/20 0103 09/14/20 0049 09/15/20 0103 09/16/20 0263 09/16/20 0945 09/17/20 0113 09/18/20 0301 09/19/20 0114  NA  --    < > 142 140 141 145  --    < > 137 140 137  K  --    < > 4.8 4.9 4.8 5.1  --    < > 4.6 4.6 4.6  CL  --    < > 112* 114* 112* 116*  --    < > 111 110 111  CO2  --    < > 20*  16* 17* 18*  --    < > 17* 20* 19*  GLUCOSE  --    < > 117* 113* 113* 138*  --    < > 128* 127* 129*  BUN  --    < > 61* 51* 60* 63*  --    < > 42* 41* 37*  CREATININE  --    < > 1.99* 1.57* 1.85* 1.88*  --    < > 1.54* 1.65* 1.55*  CALCIUM  --    < > 8.5* 8.4* 8.6* 8.7*  --    < > 8.1* 8.3* 8.2*  MG 2.1  --  2.1 2.1 2.2 2.3 2.1  --   --   --   --   PHOS 4.1  --  3.1 2.4*  --   --   --   --   --   --   --    < > = values in this interval not displayed.   Recent Labs    09/17/20 0113 09/18/20 0301 09/19/20 0114  AST 32 29 25  ALT 37 38 35  ALKPHOS 80 94 88  BILITOT 0.8 0.9 1.0  PROT 5.6* 5.8* 5.6*  ALBUMIN 2.4* 2.4* 2.3*   Recent Labs    09/13/20 0103 09/14/20 0049 09/15/20 0103 09/16/20 0733 09/17/20 0113 09/18/20 0301 09/19/20 0114  WBC 7.6 7.8 7.4   < > 10.3 11.3* 11.9*  NEUTROABS 6.6 6.6 6.2  --   --   --   --   HGB 12.1* 12.0* 11.6*   < > 9.9* 11.0* 10.5*  HCT 36.5* 36.7* 35.1*   < > 29.5* 33.6* 31.4*  MCV 92.2 92.4 91.4   < > 89.7 92.1 91.5  PLT 85* 107* 104*   < > 142* 171 196   < > = values in this interval not displayed.   Lab Results  Component Value Date   TSH 4.33 10/18/2020   Lab Results  Component Value Date   HGBA1C 6.1 (H) 09/11/2020   Lab Results  Component Value Date   CHOL 141 07/26/2019  HDL 35.80 (L) 07/26/2019   LDLCALC 82 07/26/2019   LDLDIRECT 74.0 08/25/2015   TRIG 118.0 07/26/2019   CHOLHDL 4 07/26/2019     Assessment/Plan  1. Rash -   Will start on hydrocortisone 0.1% cream apply to rashes on chest and back twice daily x2 weeks -   Keep skin clean and monitor for open wound     Family/ staff Communication: Discussed plan of care with resident and charge nurse.  Labs/tests ordered:   None  Goals of care:   Short-term care   Durenda Age, DNP, MSN, FNP-BC [facility] [phone] (Monday-Friday 8:00 a.m. - 5:00 p.m.) [phone] (after hours)

## 2020-11-28 ENCOUNTER — Telehealth: Payer: Self-pay | Admitting: *Deleted

## 2020-11-28 DIAGNOSIS — Z66 Do not resuscitate: Secondary | ICD-10-CM | POA: Insufficient documentation

## 2020-11-28 NOTE — Telephone Encounter (Addendum)
Spoke with pt's son, [name] (on dpr), notifying him DNR is ready to pick up.    [Placed DNR at front office- yellow folders.]

## 2020-11-28 NOTE — Progress Notes (Incomplete)
Location:  [place] Room Number: 115 A Place of Service:  SNF (31) Provider:  Durenda Age, DNP, FNP-BC  Patient Care Team: [name], MD as PCP - General  Extended Emergency Contact Information Primary Emergency Contact: [place] of [place] Phone: [phone] Mobile Phone: [phone] Relation: Son Secondary Emergency Contact: Dorothey Baseman Mobile Phone: [phone] Relation: Daughter  Code Status:    Goals of care: Advanced Directive information Advanced Directives 11/08/2020  Does Patient Have a Medical Advance Directive? Yes  Type of Paramedic of [place];Living will;Out of facility DNR (pink MOST or yellow form)  Does patient want to make changes to medical advance directive? No - Patient declined  Copy of [place] in Chart? Yes - validated most recent copy scanned in chart (See row information)  Would patient like information on creating a medical advance directive? -  Pre-existing out of facility DNR order (yellow form or pink MOST form) -     Chief Complaint  Patient presents with  . Acute Visit    Rashes    HPI:  Pt is a 87 y.o. male seen today for rashes on his trunk. He is a short-term care resident of [facility]. He has a PMH of COPD, CAD S/P CABG, hypertension, hyperlipidemia and tobacco use. Noted to have   Past Medical History:  Diagnosis Date  . Aorto-iliac atherosclerosis ([place]) 08/2015   by xray  . Barrett's esophagus    EGD 2012, rpt 2014, longterm PPI  . Chronic idiopathic constipation 07/17/2010  . Chronic obstructive pulmonary disease (COPD) (HCC)    spirometry with moderate COPD  . Coronary atherosclerosis of artery bypass graft   . Diabetes mellitus type II ~2008  . Gastrointestinal ulcer 04/2011   with hematemesis s/p EGD by [facility]  . GERD (gastroesophageal reflux disease)   . Hearing loss   . HLD  (hyperlipidemia)   . HNP (herniated nucleus pulposus), lumbar 2013   with compression of L5 nerve root and foot drop, also with lumbar DDD s/p surgery  . Hypertrophy of prostate without urinary obstruction and other lower urinary tract symptoms (LUTS)   . Memory loss   . Paroxysmal SVT (supraventricular tachycardia) ([place])    a. 3/14 => converted in ED with adenosine to AFib => NSR;  b. Echo 4/14:  Mild LVH, mild FBSH, EF 55-60%, Gr 1 DD  . Prostate nodule ~2009   Left-benign s/p eval Uro WNL ([place])  . Thrombocytopenia ([place])   . Tobacco use disorder    Past Surgical History:  Procedure Laterality Date  . COLONOSCOPY  03/2009   small hemorrhoids, 2 polyps (hyperplastic and adenomatous), rpt 5 yrs (Dr. Barron Schmid) - pt declined rpt scheduling  . COLONOSCOPY  10/2014   2 hyperplastic polyp, hemmorhoids ([place])  . CORONARY ARTERY BYPASS GRAFT  1999   Dr. Roxy Manns  . ESOPHAGOGASTRODUODENOSCOPY  05/16/2011   after hematemesis, no lesions found, consistent with barrett's esophagus rpt 2 yrs  . ESOPHAGOGASTRODUODENOSCOPY  06/2013   barrett's, small HH, recheck 2-3 yrs ([place])  . HEMORRHOID SURGERY    . LUMBAR LAMINECTOMY/DECOMPRESSION MICRODISCECTOMY  01/31/2012   Procedure: LUMBAR LAMINECTOMY/DECOMPRESSION MICRODISCECTOMY 1 LEVEL;  Surgeon: Winfield Cunas, MD;  Location: [place] NEURO ORS;  Service: Neurosurgery;  Laterality: Left;  LEFT Lumbar Four-Five Diskectomy  . NOSE SURGERY    . ROTATOR CUFF REPAIR  04/2010   right (but has bilat) Dr. Latanya Maudlin ([facility])    Allergies  Allergen Reactions  . Codeine     REACTION: vomiting  . Cyclobenzaprine Hcl     REACTION: vomiting  . Hydrocodone Nausea Only and Other (See Comments)    "crazy"  . Tramadol Other (See Comments)    woozy  . Valium Other (See Comments)    sedation    Outpatient Encounter Medications as of 11/24/2020  Medication Sig  . albuterol (VENTOLIN HFA) 108 (90 Base) MCG/ACT inhaler Inhale 2 puffs into the lungs every 6  (six) hours as needed for wheezing or shortness of breath.  [name] apixaban (ELIQUIS) 2.5 MG TABS tablet Take 1 tablet (2.5 mg total) by mouth 2 (two) times daily.  [name] donepezil (ARICEPT) 10 MG tablet Take 1 tablet (10 mg total) by mouth at bedtime.  . fluticasone furoate-vilanterol (BREO ELLIPTA) 100-25 MCG/INH AEPB Inhale 1 puff into the lungs daily. For COPD  . isosorbide mononitrate (IMDUR) 30 MG 24 hr tablet Take 1 tablet (30 mg total) by mouth daily.  . magnesium hydroxide (MILK OF MAGNESIA) 400 MG/5ML suspension Take 15 mLs by mouth daily as needed for moderate constipation.  . melatonin 5 MG TABS Take 5 mg by mouth at bedtime. For Insomnia  . memantine (NAMENDA) 10 MG tablet Take 1 tablet (10 mg total) by mouth 2 (two) times daily.  . metFORMIN (GLUCOPHAGE) 500 MG tablet Take 1 tablet (500 mg total) by mouth daily with breakfast.  . metoprolol tartrate (LOPRESSOR) 25 MG tablet TAKE ONE-HALF TABLET BY  MOUTH TWICE DAILY  . nicotine (NICODERM CQ - DOSED IN MG/24 HOURS) 21 mg/24hr patch Place 21 mg onto the skin daily.  . NON FORMULARY Med Pass 120 m l po three times a day for nutrition and to pervent wt loss  . omeprazole (PRILOSEC) 40 MG capsule TAKE 1 CAPSULE BY MOUTH  DAILY  . OXYGEN Inhale 2 L into the lungs continuous.  . sertraline (ZOLOFT) 50 MG tablet TAKE 1 TABLET BY MOUTH  DAILY  . simvastatin (ZOCOR) 40 MG tablet TAKE 1 TABLET BY MOUTH AT  BEDTIME  . tamsulosin (FLOMAX) 0.4 MG CAPS capsule Take 1 capsule (0.4 mg total) by mouth daily.  . vitamin B-12 (CYANOCOBALAMIN) 500 MCG tablet Take 1 tablet (500 mcg total) by mouth every Monday, Wednesday, and Friday.   No facility-administered encounter medications on file as of 11/24/2020.    Review of Systems  GENERAL: No change in appetite, no fatigue, no weight changes, no fever, chills or weakness SKIN: Denies rash, itching, wounds, ulcer sores, or nail abnormalities EYES: Denies change in vision, dry eyes, eye pain, itching or  discharge EARS: Denies change in hearing, ringing in ears, or earache NOSE: Denies nasal congestion or epistaxis MOUTH and THROAT: Denies oral discomfort, gingival pain or bleeding, pain from teeth or hoarseness   RESPIRATORY: no cough, SOB, DOE, wheezing, hemoptysis CARDIAC: No chest pain, edema or palpitations GI: No abdominal pain, diarrhea, constipation, heart burn, nausea or vomiting GU: Denies dysuria, frequency, hematuria, incontinence, or discharge MUSCULOSKELETAL: Denies joint pain, muscle pain, back pain, restricted movement, or unusual weakness CIRCULATION: Denies claudication, edema of legs, varicosities, or cold extremities NEUROLOGICAL: Denies dizziness, syncope, numbness, or headache PSYCHIATRIC: Denies feelings of depression or anxiety. No report of hallucinations, insomnia, paranoia, or agitation ENDOCRINE: Denies polyphagia, polyuria, polydipsia, heat or cold intolerance HEME/LYMPH: Denies excessive bruising, petechia, enlarged lymph nodes, or bleeding problems IMMUNOLOGIC: Denies history of frequent infections, AIDS, or use of immunosuppressive agents   Immunization History  Administered Date(s) Administered  . Fluad  Quad(high Dose 65+) 08/02/2019  . Influenza Whole 06/20/2010  . Influenza, High Dose Seasonal PF 06/02/2015, 07/04/2017, 06/15/2018  . Influenza, Seasonal, Injecte, Preservative Fre 06/16/2014  . Influenza-Unspecified 07/17/2016  . Pneumococcal Polysaccharide-23 09/17/2011  . Td 07/01/1999, 08/14/2010   Pertinent  Health Maintenance Due  Topic Date Due  . OPHTHALMOLOGY EXAM  06/16/2012  . COLONOSCOPY (Pts 45-50yrs Insurance coverage will need to be confirmed)  11/10/2019  . INFLUENZA VACCINE  04/16/2020  . URINE MICROALBUMIN  07/25/2020  . PNA vac Low Risk Adult (2 of 2 - PCV13) 12/14/2025 (Originally 09/16/2012)  . FOOT EXAM  01/23/2021  . HEMOGLOBIN A1C  03/12/2021   Fall Risk  04/10/2020 08/02/2019 07/07/2018 10/15/2017 10/01/2016  Falls in the past  year? 0 1 No No No  Number falls in past yr: - 0 - - -  Injury with Fall? - 0 - - -     Vitals:   11/24/20 1100  BP: (!) 127/56  Pulse: 74  Resp: 18  Temp: (!) 97.5 F (36.4 C)  Weight: 152 lb 12.8 oz (69.3 kg)  Height: 5\' 8"  (1.727 m)   Body mass index is 23.23 kg/m.  Physical Exam  GENERAL APPEARANCE: Well nourished. In no acute distress. Normal body habitus SKIN:  Skin is warm and dry. There are no suspicious lesions or rash HEAD: Normal in size and contour. No evidence of trauma EYES: Lids open and close normally. No blepharitis, entropion or ectropion. PERRL. Conjunctivae are clear and sclerae are white. Lenses are without opacity EARS: Pinnae are normal. Patient hears normal voice tunes of the examiner MOUTH and THROAT: Lips are without lesions. Oral mucosa is moist and without lesions. Tongue is normal in shape, size, and color and without lesions NECK: supple, trachea midline, no neck masses, no thyroid tenderness, no thyromegaly LYMPHATICS: No LAN in the neck, no supraclavicular LAN RESPIRATORY: Breathing is even & unlabored, BS CTAB CARDIAC: RRR, no murmur,no extra heart sounds, no edema GI: Abdomen soft, normal BS, no masses, no tenderness, no hepatomegaly, no splenomegaly MUSCULOSKELETAL: No deformities. Movement at each extremity is full and painless. Strength is 5/5 at each extremity. Back is without kyphosis or scoliosis CIRCULATION: Pedal pulses are 2+. There is no edema of the legs, ankles and feet NEUROLOGICAL: There is no tremor. Speech is clear PSYCHIATRIC: Alert and oriented X 3. Affect and behavior are appropriate  Labs reviewed: Recent Labs    09/11/20 0913 09/11/20 1035 09/12/20 0110 09/13/20 0103 09/14/20 0049 09/15/20 0103 09/16/20 7829 09/16/20 0945 09/17/20 0113 09/18/20 0301 09/19/20 0114  NA  --    < > 142 140 141 145  --    < > 137 140 137  K  --    < > 4.8 4.9 4.8 5.1  --    < > 4.6 4.6 4.6  CL  --    < > 112* 114* 112* 116*  --     < > 111 110 111  CO2  --    < > 20* 16* 17* 18*  --    < > 17* 20* 19*  GLUCOSE  --    < > 117* 113* 113* 138*  --    < > 128* 127* 129*  BUN  --    < > 61* 51* 60* 63*  --    < > 42* 41* 37*  CREATININE  --    < > 1.99* 1.57* 1.85* 1.88*  --    < > 1.54* 1.65* 1.55*  CALCIUM  --    < > 8.5* 8.4* 8.6* 8.7*  --    < > 8.1* 8.3* 8.2*  MG 2.1  --  2.1 2.1 2.2 2.3 2.1  --   --   --   --   PHOS 4.1  --  3.1 2.4*  --   --   --   --   --   --   --    < > = values in this interval not displayed.   Recent Labs    09/17/20 0113 09/18/20 0301 09/19/20 0114  AST 32 29 25  ALT 37 38 35  ALKPHOS 80 94 88  BILITOT 0.8 0.9 1.0  PROT 5.6* 5.8* 5.6*  ALBUMIN 2.4* 2.4* 2.3*   Recent Labs    09/13/20 0103 09/14/20 0049 09/15/20 0103 09/16/20 0733 09/17/20 0113 09/18/20 0301 09/19/20 0114  WBC 7.6 7.8 7.4   < > 10.3 11.3* 11.9*  NEUTROABS 6.6 6.6 6.2  --   --   --   --   HGB 12.1* 12.0* 11.6*   < > 9.9* 11.0* 10.5*  HCT 36.5* 36.7* 35.1*   < > 29.5* 33.6* 31.4*  MCV 92.2 92.4 91.4   < > 89.7 92.1 91.5  PLT 85* 107* 104*   < > 142* 171 196   < > = values in this interval not displayed.   Lab Results  Component Value Date   TSH 4.33 10/18/2020   Lab Results  Component Value Date   HGBA1C 6.1 (H) 09/11/2020   Lab Results  Component Value Date   CHOL 141 07/26/2019   HDL 35.80 (L) 07/26/2019   LDLCALC 82 07/26/2019   LDLDIRECT 74.0 08/25/2015   TRIG 118.0 07/26/2019   CHOLHDL 4 07/26/2019    Significant Diagnostic Results in last 30 days:  No results found.  Assessment/Plan    Family/ staff Communication:   Labs/tests ordered:    Goals of care:      Durenda Age, DNP, MSN, FNP-BC [facility] [phone] (Monday-Friday 8:00 a.m. - 5:00 p.m.) [phone] (after hours)

## 2020-11-28 NOTE — Telephone Encounter (Signed)
Patient's son Elenore Rota (on [place]) left a voicemail stating that his dad would like to have a DNR done. Patient's son stated that this is his dad's request.

## 2020-11-28 NOTE — Telephone Encounter (Signed)
I had previously discussed this with patient as well.  [place] DNR form filled and in Lisa's box.

## 2020-11-29 ENCOUNTER — Non-Acute Institutional Stay (SKILLED_NURSING_FACILITY): Payer: Medicare Other | Admitting: Adult Health

## 2020-11-29 ENCOUNTER — Encounter: Payer: Self-pay | Admitting: Adult Health

## 2020-11-29 DIAGNOSIS — I4891 Unspecified atrial fibrillation: Secondary | ICD-10-CM

## 2020-11-29 DIAGNOSIS — E118 Type 2 diabetes mellitus with unspecified complications: Secondary | ICD-10-CM | POA: Diagnosis not present

## 2020-11-29 DIAGNOSIS — F339 Major depressive disorder, recurrent, unspecified: Secondary | ICD-10-CM

## 2020-11-29 DIAGNOSIS — N400 Enlarged prostate without lower urinary tract symptoms: Secondary | ICD-10-CM

## 2020-11-29 DIAGNOSIS — I25119 Atherosclerotic heart disease of native coronary artery with unspecified angina pectoris: Secondary | ICD-10-CM

## 2020-11-29 DIAGNOSIS — F0390 Unspecified dementia without behavioral disturbance: Secondary | ICD-10-CM

## 2020-11-29 NOTE — Progress Notes (Signed)
Location:  [facility] Room Number: 115-A Place of Service:  SNF (31) Provider:  Durenda Age, DNP, FNP-BC  Patient Care Team: [name], MD as PCP - General  Extended Emergency Contact Information Primary Emergency Contact: [place] Phone: [phone] Mobile Phone: [phone] Relation: Son Secondary Emergency Contact: Dorothey Baseman Mobile Phone: [phone] Relation: Daughter  Code Status:  DNR  Goals of care: Advanced Directive information Advanced Directives 11/29/2020  Does Patient Have a Medical Advance Directive? Yes  Type of Paramedic of [place];Living will;Out of facility DNR (pink MOST or yellow form)  Does patient want to make changes to medical advance directive? No - Patient declined  Copy of [place] in Chart? Yes - validated most recent copy scanned in chart (See row information)  Would patient like information on creating a medical advance directive? -  Pre-existing out of facility DNR order (yellow form or pink MOST form) -     Chief Complaint  Patient presents with  . Medical Management of Chronic Issues    Routine Visit of Medical Management     HPI:  Pt is a 87 y.o. male seen today for medical management of chronic diseases. He has a PMH of COPD, CAD S/P CABG, hyperlipidemia, diabetes mellitus type 2 and tobacco use. HRs ranging from 74 to 93. He takes Metoprolol tartrate 12.5 mg BID and Eliquis for Atrial fibrillation. He denies chest pain. He takes Isosorbide MN ER 30 mg daily for CAD. CBGs ranging from  93 to 188. He takes Metformin 500 mg daily for diabetes mellitus.   Past Medical History:  Diagnosis Date  . Aorto-iliac atherosclerosis ([place]) 08/2015   by xray  . Barrett's esophagus    EGD 2012, rpt 2014, longterm PPI  . Chronic idiopathic constipation 07/17/2010  . Chronic obstructive pulmonary disease (COPD) (HCC)     spirometry with moderate COPD  . Coronary atherosclerosis of artery bypass graft   . Diabetes mellitus type II ~2008  . Gastrointestinal ulcer 04/2011   with hematemesis s/p EGD by [facility]  . GERD (gastroesophageal reflux disease)   . Hearing loss   . HLD (hyperlipidemia)   . HNP (herniated nucleus pulposus), lumbar 2013   with compression of L5 nerve root and foot drop, also with lumbar DDD s/p surgery  . Hypertrophy of prostate without urinary obstruction and other lower urinary tract symptoms (LUTS)   . Memory loss   . Paroxysmal SVT (supraventricular tachycardia) ([place])    a. 3/14 => converted in ED with adenosine to AFib => NSR;  b. Echo 4/14:  Mild LVH, mild FBSH, EF 55-60%, Gr 1 DD  . Prostate nodule ~2009   Left-benign s/p eval Uro WNL ([place])  . Thrombocytopenia ([place])   . Tobacco use disorder    Past Surgical History:  Procedure Laterality Date  . COLONOSCOPY  03/2009   small hemorrhoids, 2 polyps (hyperplastic and adenomatous), rpt 5 yrs (Dr. Barron Schmid) - pt declined rpt scheduling  . COLONOSCOPY  10/2014   2 hyperplastic polyp, hemmorhoids ([place])  . CORONARY ARTERY BYPASS GRAFT  1999   Dr. Roxy Manns  . ESOPHAGOGASTRODUODENOSCOPY  05/16/2011   after hematemesis, no lesions found, consistent with barrett's esophagus rpt 2 yrs  . ESOPHAGOGASTRODUODENOSCOPY  06/2013   barrett's, small HH, recheck 2-3 yrs ([place])  . HEMORRHOID SURGERY    . LUMBAR LAMINECTOMY/DECOMPRESSION MICRODISCECTOMY  01/31/2012   Procedure: LUMBAR LAMINECTOMY/DECOMPRESSION MICRODISCECTOMY 1 LEVEL;  Surgeon: Vickki Muff  Christella Noa, MD;  Location: [place] [facility] NEURO ORS;  Service: Neurosurgery;  Laterality: Left;  LEFT Lumbar Four-Five Diskectomy  . NOSE SURGERY    . ROTATOR CUFF REPAIR  04/2010   right (but has bilat) Dr. Latanya Maudlin ([facility])    Allergies  Allergen Reactions  . Codeine     REACTION: vomiting  . Cyclobenzaprine Hcl     REACTION: vomiting  . Hydrocodone Nausea Only and Other (See Comments)    "crazy"   . Tramadol Other (See Comments)    woozy  . Valium Other (See Comments)    sedation    Outpatient Encounter Medications as of 11/29/2020  Medication Sig  . albuterol (VENTOLIN HFA) 108 (90 Base) MCG/ACT inhaler Inhale 2 puffs into the lungs every 6 (six) hours as needed for wheezing or shortness of breath.  [name] apixaban (ELIQUIS) 2.5 MG TABS tablet Take 1 tablet (2.5 mg total) by mouth 2 (two) times daily.  [name] donepezil (ARICEPT) 10 MG tablet Take 1 tablet (10 mg total) by mouth at bedtime.  . fluticasone furoate-vilanterol (BREO ELLIPTA) 100-25 MCG/INH AEPB Inhale 1 puff into the lungs daily. For COPD  . hydrocortisone butyrate (LUCOID) 0.1 % CREA cream Apply 1 application topically 2 (two) times daily. Apply to hands, chest and back  . isosorbide mononitrate (IMDUR) 30 MG 24 hr tablet Take 1 tablet (30 mg total) by mouth daily.  . magnesium hydroxide (MILK OF MAGNESIA) 400 MG/5ML suspension Take 15 mLs by mouth daily as needed for moderate constipation.  . melatonin 5 MG TABS Take 5 mg by mouth at bedtime. For Insomnia  . memantine (NAMENDA) 10 MG tablet Take 1 tablet (10 mg total) by mouth 2 (two) times daily.  . metFORMIN (GLUCOPHAGE) 500 MG tablet Take 1 tablet (500 mg total) by mouth daily with breakfast.  . metoprolol tartrate (LOPRESSOR) 25 MG tablet TAKE ONE-HALF TABLET BY  MOUTH TWICE DAILY  . nicotine (NICODERM CQ - DOSED IN MG/24 HOURS) 21 mg/24hr patch Place 21 mg onto the skin daily.  . NON FORMULARY Med Pass 120 m l po three times a day for nutrition and to pervent wt loss  . omeprazole (PRILOSEC) 40 MG capsule TAKE 1 CAPSULE BY MOUTH  DAILY  . OXYGEN Inhale 2 L into the lungs continuous.  . sertraline (ZOLOFT) 50 MG tablet Take 75 mg by mouth daily.  . simvastatin (ZOCOR) 40 MG tablet TAKE 1 TABLET BY MOUTH AT  BEDTIME  . tamsulosin (FLOMAX) 0.4 MG CAPS capsule Take 1 capsule (0.4 mg total) by mouth daily.  . traZODone (DESYREL) 50 MG tablet Take 12.5 mg by mouth 2 (two) times  daily.  [name] valproic acid (DEPAKENE) 250 MG/5ML solution Take 250 mg by mouth 4 (four) times daily.  . vitamin B-12 (CYANOCOBALAMIN) 500 MCG tablet Take 1 tablet (500 mcg total) by mouth every Monday, Wednesday, and Friday.  . [DISCONTINUED] sertraline (ZOLOFT) 50 MG tablet TAKE 1 TABLET BY MOUTH  DAILY   No facility-administered encounter medications on file as of 11/29/2020.    Review of Systems  GENERAL: No fever or chills  MOUTH and THROAT: Denies oral discomfort, gingival pain or bleeding RESPIRATORY: no cough, SOB, DOE, wheezing, hemoptysis CARDIAC: No chest pain, edema or palpitations GI: No abdominal pain, diarrhea, constipation, heart burn, nausea or vomiting NEUROLOGICAL: Denies dizziness, syncope, numbness, or headache PSYCHIATRIC: Denies feelings of depression or anxiety. No report of hallucinations, insomnia, paranoia, or agitation   Immunization History  Administered Date(s) Administered  . Fluad Quad(high  Dose 65+) 08/02/2019  . Influenza Whole 06/20/2010  . Influenza, High Dose Seasonal PF 06/02/2015, 07/04/2017, 06/15/2018  . Influenza, Seasonal, Injecte, Preservative Fre 06/16/2014  . Influenza-Unspecified 07/17/2016  . Pneumococcal Polysaccharide-23 09/17/2011  . Td 07/01/1999, 08/14/2010   Pertinent  Health Maintenance Due  Topic Date Due  . OPHTHALMOLOGY EXAM  06/16/2012  . COLONOSCOPY (Pts 45-27yrs Insurance coverage will need to be confirmed)  11/10/2019  . INFLUENZA VACCINE  04/16/2020  . URINE MICROALBUMIN  07/25/2020  . PNA vac Low Risk Adult (2 of 2 - PCV13) 12/14/2025 (Originally 09/16/2012)  . FOOT EXAM  01/23/2021  . HEMOGLOBIN A1C  03/12/2021   Fall Risk  04/10/2020 08/02/2019 07/07/2018 10/15/2017 10/01/2016  Falls in the past year? 0 1 No No No  Number falls in past yr: - 0 - - -  Injury with Fall? - 0 - - -     Vitals:   11/29/20 1527  BP: 118/67  Pulse: 77  Resp: 20  Temp: (!) 97.3 F (36.3 C)  Weight: 152 lb 12.8 oz (69.3 kg)  Height:  5\' 8"  (1.727 m)   Body mass index is 23.23 kg/m.  Physical Exam  GENERAL APPEARANCE: Well nourished. In no acute distress. Normal body habitus SKIN:  Skin is warm and dry.  MOUTH and THROAT: Lips are without lesions. Oral mucosa is moist and without lesions.  RESPIRATORY: Breathing is even & unlabored, BS CTAB CARDIAC: RRR, no murmur,no extra heart sounds, no edema GI: Abdomen soft, normal BS, no masses, no tenderness NEUROLOGICAL: There is no tremor. Speech is clear. Alert to self, disoriented to time and place. PSYCHIATRIC:  Affect and behavior are appropriate  Labs reviewed: Recent Labs    09/11/20 0913 09/11/20 1035 09/12/20 0110 09/13/20 0103 09/14/20 0049 09/15/20 0103 09/16/20 4193 09/16/20 0945 09/17/20 0113 09/18/20 0301 09/19/20 0114  NA  --    < > 142 140 141 145  --    < > 137 140 137  K  --    < > 4.8 4.9 4.8 5.1  --    < > 4.6 4.6 4.6  CL  --    < > 112* 114* 112* 116*  --    < > 111 110 111  CO2  --    < > 20* 16* 17* 18*  --    < > 17* 20* 19*  GLUCOSE  --    < > 117* 113* 113* 138*  --    < > 128* 127* 129*  BUN  --    < > 61* 51* 60* 63*  --    < > 42* 41* 37*  CREATININE  --    < > 1.99* 1.57* 1.85* 1.88*  --    < > 1.54* 1.65* 1.55*  CALCIUM  --    < > 8.5* 8.4* 8.6* 8.7*  --    < > 8.1* 8.3* 8.2*  MG 2.1  --  2.1 2.1 2.2 2.3 2.1  --   --   --   --   PHOS 4.1  --  3.1 2.4*  --   --   --   --   --   --   --    < > = values in this interval not displayed.   Recent Labs    09/17/20 0113 09/18/20 0301 09/19/20 0114  AST 32 29 25  ALT 37 38 35  ALKPHOS 80 94 88  BILITOT 0.8 0.9 1.0  PROT 5.6* 5.8*  5.6*  ALBUMIN 2.4* 2.4* 2.3*   Recent Labs    09/13/20 0103 09/14/20 0049 09/15/20 0103 09/16/20 0733 09/17/20 0113 09/18/20 0301 09/19/20 0114  WBC 7.6 7.8 7.4   < > 10.3 11.3* 11.9*  NEUTROABS 6.6 6.6 6.2  --   --   --   --   HGB 12.1* 12.0* 11.6*   < > 9.9* 11.0* 10.5*  HCT 36.5* 36.7* 35.1*   < > 29.5* 33.6* 31.4*  MCV 92.2 92.4 91.4   <  > 89.7 92.1 91.5  PLT 85* 107* 104*   < > 142* 171 196   < > = values in this interval not displayed.   Lab Results  Component Value Date   TSH 4.33 10/18/2020   Lab Results  Component Value Date   HGBA1C 6.1 (H) 09/11/2020   Lab Results  Component Value Date   CHOL 141 07/26/2019   HDL 35.80 (L) 07/26/2019   LDLCALC 82 07/26/2019   LDLDIRECT 74.0 08/25/2015   TRIG 118.0 07/26/2019   CHOLHDL 4 07/26/2019     Assessment/Plan  1. Atrial fibrillation with RVR (HCC) -  Rate-controlled, continue Eliquis for anticoagulation and Metoprolol tartrate for rate-control  2. Coronary artery disease involving native coronary artery of native heart with angina pectoris ([place]) -  Denies chest pain, continue Isosorbide MN ER and Simvastatin  3. Benign prostatic hyperplasia, unspecified whether lower urinary tract symptoms present -  Stable, continue Tamsulosin  4. Recurrent major depressive disorder, remission status unspecified ([place]) - mood is stable, continue Sertraline -  Followed by psych NP  5. Diabetes mellitus type 2 with complications [facility]) Lab Results  Component Value Date   HGBA1C 6.1 (H) 09/11/2020   -  Continue Metformin and monitor CBGs  6. Dementia with behavioral disturbance, unspecified dementia type ([place]) -   Continue Donepezil, Memantine and supportive care     Family/ staff Communication:  Discussed plan of care with resident and charge nurse.  Labs/tests ordered:  None  Goals of care:   Short-term care   Durenda Age, DNP, MSN, FNP-BC [facility] and [facility] [phone] (Monday-Friday 8:00 a.m. - 5:00 p.m.) [phone] (after hours)

## 2020-12-04 ENCOUNTER — Encounter: Payer: Self-pay | Admitting: Internal Medicine

## 2020-12-04 ENCOUNTER — Non-Acute Institutional Stay (SKILLED_NURSING_FACILITY): Payer: Medicare Other | Admitting: Internal Medicine

## 2020-12-04 DIAGNOSIS — L309 Dermatitis, unspecified: Secondary | ICD-10-CM

## 2020-12-04 DIAGNOSIS — F0150 Vascular dementia without behavioral disturbance: Secondary | ICD-10-CM

## 2020-12-04 DIAGNOSIS — Z0279 Encounter for issue of other medical certificate: Secondary | ICD-10-CM

## 2020-12-04 NOTE — Progress Notes (Signed)
   NURSING HOME LOCATION:  [facility] ROOM NUMBER:  115  CODE STATUS:  DNR  PCP: Darrick Penna. Linna Darner, MD  This is a nursing facility follow up visit for specific acute issue of rash noted 3/17.  Interim medical record and care since last SNF visit was updated with review of diagnostic studies and change in clinical status since last visit were documented.  HPI: The change in condition form states that he has a red, itchy rash over the upper chest, in the armpits, and groin area.  The patient denies having a rash.  His comment was "I don't even know why I am here".  Serial glucoses have been minimally elevated and A1c was 6.1% on 09/11/2020, prediabetic.  Review of systems: Dementia invalidated responses.  He did not recognize me even though we have met @ least 3 times.  As noted his review of systems is negative despite the presence of the rash on exam.  Constitutional: No fever, significant weight change, fatigue  Dermatologic: No rash, pruritus, change in appearance of skin Hematologic/lymphatic: No significant bruising, lymphadenopathy, abnormal bleeding Allergy/immunology: No itchy/watery eyes, significant sneezing, urticaria, angioedema  Physical exam:  Pertinent or positive findings: Pattern alopecia is present.  He has a mustache and beard.  He has an upper plate and lower partial.  Chest is barrel-shaped with decreased breath sounds.  Pedal pulses are decreased.  There is a very faint erythema over the left upper chest; in the axilla, and in the inguinal creases.  There are 2 small, localized patches of erythema which are rough to touch and blanch slightly with pressure.  There is no associated maceration with any of these areas of erythema.  General appearance: Adequately norished; no acute distress, increased work of breathing is present.   Lymphatic: No lymphadenopathy about the head, neck, axilla. Eyes: No conjunctival inflammation or lid edema is  present. There is no scleral icterus. Ears:  External ear exam shows no significant lesions or deformities.   Nose:  External nasal examination shows no deformity or inflammation. Nasal mucosa are pink and moist without lesions, exudates Oral exam:  Lips and gums are healthy appearing.  Neck:  No thyromegaly, masses, tenderness noted.    Heart:  No gallop, murmur, click, rub .  Lungs:  without wheezes, rhonchi, rales, rubs. Abdomen: Bowel sounds are normal. Abdomen is soft and nontender with no organomegaly, hernias, masses. GU: Deferred  Extremities:  No cyanosis, clubbing, edema  Skin: Warm & dry w/o tenting.  See summary under each active problem in the Problem List with associated updated therapeutic plan

## 2020-12-04 NOTE — Assessment & Plan Note (Signed)
12/04/2020 he denies rash or associated symptoms yet rash is visible in multiple locations. He has no idea why he is at the SNF.

## 2020-12-04 NOTE — Patient Instructions (Signed)
See assessment and plan under each diagnosis in the problem list and acutely for this visit 

## 2020-12-06 ENCOUNTER — Non-Acute Institutional Stay (SKILLED_NURSING_FACILITY): Payer: Medicare Other | Admitting: Adult Health

## 2020-12-06 ENCOUNTER — Encounter: Payer: Self-pay | Admitting: Adult Health

## 2020-12-06 ENCOUNTER — Encounter: Payer: Self-pay | Admitting: Internal Medicine

## 2020-12-06 DIAGNOSIS — R21 Rash and other nonspecific skin eruption: Secondary | ICD-10-CM

## 2020-12-06 DIAGNOSIS — E118 Type 2 diabetes mellitus with unspecified complications: Secondary | ICD-10-CM | POA: Diagnosis not present

## 2020-12-06 DIAGNOSIS — L309 Dermatitis, unspecified: Secondary | ICD-10-CM | POA: Insufficient documentation

## 2020-12-06 DIAGNOSIS — Z7189 Other specified counseling: Secondary | ICD-10-CM

## 2020-12-06 DIAGNOSIS — I4891 Unspecified atrial fibrillation: Secondary | ICD-10-CM | POA: Diagnosis not present

## 2020-12-06 DIAGNOSIS — N400 Enlarged prostate without lower urinary tract symptoms: Secondary | ICD-10-CM

## 2020-12-06 DIAGNOSIS — J431 Panlobular emphysema: Secondary | ICD-10-CM | POA: Diagnosis not present

## 2020-12-06 DIAGNOSIS — F4323 Adjustment disorder with mixed anxiety and depressed mood: Secondary | ICD-10-CM

## 2020-12-06 NOTE — Progress Notes (Signed)
Location:  [place] Room Number: 115/A Place of Service:  SNF (31) Provider:  Durenda Age, DNP, FNP-BC  Patient Care Team: [name], MD as PCP - General  Extended Emergency Contact Information Primary Emergency Contact: [place] of [place] Phone: [phone] Mobile Phone: [phone] Relation: Son Secondary Emergency Contact: Dorothey Baseman Mobile Phone: [phone] Relation: Daughter  Code Status:  DNR  Goals of care: Advanced Directive information Advanced Directives 12/06/2020  Does Patient Have a Medical Advance Directive? No  Type of Paramedic of [place];Living will;Out of facility DNR (pink MOST or yellow form)  Does patient want to make changes to medical advance directive? No - Patient declined  Copy of [place] in Chart? Yes - validated most recent copy scanned in chart (See row information)  Would patient like information on creating a medical advance directive? -  Pre-existing out of facility DNR order (yellow form or pink MOST form) -     Chief Complaint  Patient presents with  . Acute Visit    Care Plan Meeting     HPI:  Pt is a 87 y.o. male seen for care plan meeting. He is a short-term care resident of [facility]. He has a PMH of COPD, CAD S/P CABG, hyperlipidemia, diabetes mellitus type 2 and tobacco use. Care plan meeting was attended by resident, [place] coordinator, [employer], social worker and son, [name], who attended via telephone conference. He remains to be DNR. Discussed medications, vital signs and weights. Rashes on chest and back improving and currently on Clotrimazole-Betamethasone lotion daily. It was reported that he gets verbally aggressive at times. He participates in restorative program - [facility].  The meeting lasted for 20 minutes.                                        Past Medical History:   Diagnosis Date  . Aorto-iliac atherosclerosis ([place]) 08/2015   by xray  . Barrett's esophagus    EGD 2012, rpt 2014, longterm PPI  . Chronic idiopathic constipation 07/17/2010  . Chronic obstructive pulmonary disease (COPD) (HCC)    spirometry with moderate COPD  . Coronary atherosclerosis of artery bypass graft   . Diabetes mellitus type II ~2008  . Gastrointestinal ulcer 04/2011   with hematemesis s/p EGD by [facility]  . GERD (gastroesophageal reflux disease)   . Hearing loss   . HLD (hyperlipidemia)   . HNP (herniated nucleus pulposus), lumbar 2013   with compression of L5 nerve root and foot drop, also with lumbar DDD s/p surgery  . Hypertrophy of prostate without urinary obstruction and other lower urinary tract symptoms (LUTS)   . Memory loss   . Paroxysmal SVT (supraventricular tachycardia) ([place])    a. 3/14 => converted in ED with adenosine to AFib => NSR;  b. Echo 4/14:  Mild LVH, mild FBSH, EF 55-60%, Gr 1 DD  . Prostate nodule ~2009   Left-benign s/p eval Uro WNL ([place])  . Thrombocytopenia ([place])   . Tobacco use disorder    Past Surgical History:  Procedure Laterality Date  . COLONOSCOPY  03/2009   small hemorrhoids, 2 polyps (hyperplastic and adenomatous), rpt 5 yrs (Dr. Barron Schmid) - pt declined rpt scheduling  . COLONOSCOPY  10/2014   2 hyperplastic polyp, hemmorhoids ([place])  . [place]  Dr. Roxy Manns  . ESOPHAGOGASTRODUODENOSCOPY  05/16/2011   after hematemesis, no lesions found, consistent with barrett's esophagus rpt 2 yrs  . ESOPHAGOGASTRODUODENOSCOPY  06/2013   barrett's, small HH, recheck 2-3 yrs ([place])  . HEMORRHOID SURGERY    . LUMBAR LAMINECTOMY/DECOMPRESSION MICRODISCECTOMY  01/31/2012   Procedure: LUMBAR LAMINECTOMY/DECOMPRESSION MICRODISCECTOMY 1 LEVEL;  Surgeon: Winfield Cunas, MD;  Location: [place] NEURO ORS;  Service: Neurosurgery;  Laterality: Left;  LEFT Lumbar Four-Five Diskectomy  . NOSE SURGERY    . ROTATOR CUFF REPAIR  04/2010    right (but has bilat) Dr. Latanya Maudlin ([facility])    Allergies  Allergen Reactions  . Codeine     REACTION: vomiting  . Cyclobenzaprine Hcl     REACTION: vomiting  . Hydrocodone Nausea Only and Other (See Comments)    "crazy"  . Tramadol Other (See Comments)    woozy  . Valium Other (See Comments)    sedation    Outpatient Encounter Medications as of 12/06/2020  Medication Sig  . albuterol (VENTOLIN HFA) 108 (90 Base) MCG/ACT inhaler Inhale 2 puffs into the lungs every 6 (six) hours as needed for wheezing or shortness of breath.  [name] apixaban (ELIQUIS) 2.5 MG TABS tablet Take 1 tablet (2.5 mg total) by mouth 2 (two) times daily.  . clotrimazole-betamethasone (LOTRISONE) lotion Apply 1 application topically 2 (two) times daily. Apply to rash on trunk, axillae and groin for 7 days  . donepezil (ARICEPT) 10 MG tablet Take 1 tablet (10 mg total) by mouth at bedtime.  . fluticasone furoate-vilanterol (BREO ELLIPTA) 100-25 MCG/INH AEPB Inhale 1 puff into the lungs daily. For COPD  . hydrocortisone butyrate (LUCOID) 0.1 % CREA cream Apply 1 application topically 2 (two) times daily. Apply to hands, chest and back  . isosorbide mononitrate (IMDUR) 30 MG 24 hr tablet Take 1 tablet (30 mg total) by mouth daily.  . magnesium hydroxide (MILK OF MAGNESIA) 400 MG/5ML suspension Take 15 mLs by mouth daily as needed for moderate constipation.  . melatonin 5 MG TABS Take 5 mg by mouth at bedtime. For Insomnia  . memantine (NAMENDA) 10 MG tablet Take 1 tablet (10 mg total) by mouth 2 (two) times daily.  . metFORMIN (GLUCOPHAGE) 500 MG tablet Take 1 tablet (500 mg total) by mouth daily with breakfast.  . metoprolol tartrate (LOPRESSOR) 25 MG tablet TAKE ONE-HALF TABLET BY  MOUTH TWICE DAILY  . nicotine (NICODERM CQ - DOSED IN MG/24 HOURS) 21 mg/24hr patch Place 21 mg onto the skin daily.  . NON FORMULARY Med Pass 120 m l po three times a day for nutrition and to pervent wt loss  . omeprazole  (PRILOSEC) 40 MG capsule TAKE 1 CAPSULE BY MOUTH  DAILY  . OXYGEN Inhale 2 L into the lungs continuous.  . sertraline (ZOLOFT) 50 MG tablet Take 75 mg by mouth daily.  . simvastatin (ZOCOR) 40 MG tablet TAKE 1 TABLET BY MOUTH AT  BEDTIME  . tamsulosin (FLOMAX) 0.4 MG CAPS capsule Take 1 capsule (0.4 mg total) by mouth daily.  . traZODone (DESYREL) 50 MG tablet Take 12.5 mg by mouth 2 (two) times daily.  [name] valproic acid (DEPAKENE) 250 MG/5ML solution Take 250 mg by mouth 4 (four) times daily.  . vitamin B-12 (CYANOCOBALAMIN) 500 MCG tablet Take 1 tablet (500 mcg total) by mouth every Monday, Wednesday, and Friday.   No facility-administered encounter medications on file as of 12/06/2020.    Review of Systems  GENERAL: No change in appetite, no  fatigue, no weight changes, no fever or chills  MOUTH and THROAT: Denies oral discomfort, gingival pain or bleeding RESPIRATORY: no cough, SOB, DOE, wheezing, hemoptysis CARDIAC: No chest pain, edema or palpitations GI: No abdominal pain, diarrhea, constipation, heart burn, nausea or vomiting GU: Denies dysuria, frequency, hematuria or discharge NEUROLOGICAL: Denies dizziness, syncope, numbness, or headache PSYCHIATRIC: Denies feelings of depression or anxiety. No report of hallucinations, insomnia, paranoia, or agitation   Immunization History  Administered Date(s) Administered  . Fluad Quad(high Dose 65+) 08/02/2019  . Influenza Whole 06/20/2010  . Influenza, High Dose Seasonal PF 06/02/2015, 07/04/2017, 06/15/2018  . Influenza, Seasonal, Injecte, Preservative Fre 06/16/2014  . Influenza-Unspecified 07/17/2016  . Pneumococcal Polysaccharide-23 09/17/2011  . Td 07/01/1999, 08/14/2010   Pertinent  Health Maintenance Due  Topic Date Due  . OPHTHALMOLOGY EXAM  06/16/2012  . COLONOSCOPY (Pts 45-50yrs Insurance coverage will need to be confirmed)  11/10/2019  . INFLUENZA VACCINE  04/16/2020  . URINE MICROALBUMIN  07/25/2020  . PNA vac Low Risk  Adult (2 of 2 - PCV13) 12/14/2025 (Originally 09/16/2012)  . FOOT EXAM  01/23/2021  . HEMOGLOBIN A1C  03/12/2021   Fall Risk  04/10/2020 08/02/2019 07/07/2018 10/15/2017 10/01/2016  Falls in the past year? 0 1 No No No  Number falls in past yr: - 0 - - -  Injury with Fall? - 0 - - -     Vitals:   12/06/20 0955  BP: (!) 126/50  Pulse: 69  Resp: 18  Temp: 97.7 F (36.5 C)  Weight: 152 lb 12.8 oz (69.3 kg)  Height: 5\' 8"  (1.727 m)   Body mass index is 23.23 kg/m.  Physical Exam  GENERAL APPEARANCE: Well nourished. In no acute distress. Normal body habitus SKIN:  Rash on trunk, minimal MOUTH and THROAT: Lips are without lesions. Oral mucosa is moist and without lesions.  RESPIRATORY: Breathing is even & unlabored, BS CTAB CARDIAC: RRR, no murmur,no extra heart sounds, no edema GI: Abdomen soft, normal BS, no masses, no tenderness NEUROLOGICAL: There is no tremor. Speech is clear. Alert to self, disoriented to time and place. PSYCHIATRIC:  Affect and behavior are appropriate  Labs reviewed: Recent Labs    09/11/20 0913 09/11/20 1035 09/12/20 0110 09/13/20 0103 09/14/20 5638 09/15/20 0103 09/16/20 7564 09/16/20 0945 09/17/20 0113 09/18/20 0301 09/19/20 0114  NA  --    < > 142 140 141 145  --    < > 137 140 137  K  --    < > 4.8 4.9 4.8 5.1  --    < > 4.6 4.6 4.6  CL  --    < > 112* 114* 112* 116*  --    < > 111 110 111  CO2  --    < > 20* 16* 17* 18*  --    < > 17* 20* 19*  GLUCOSE  --    < > 117* 113* 113* 138*  --    < > 128* 127* 129*  BUN  --    < > 61* 51* 60* 63*  --    < > 42* 41* 37*  CREATININE  --    < > 1.99* 1.57* 1.85* 1.88*  --    < > 1.54* 1.65* 1.55*  CALCIUM  --    < > 8.5* 8.4* 8.6* 8.7*  --    < > 8.1* 8.3* 8.2*  MG 2.1  --  2.1 2.1 2.2 2.3 2.1  --   --   --   --  PHOS 4.1  --  3.1 2.4*  --   --   --   --   --   --   --    < > = values in this interval not displayed.   Recent Labs    09/17/20 0113 09/18/20 0301 09/19/20 0114  AST 32 29 25   ALT 37 38 35  ALKPHOS 80 94 88  BILITOT 0.8 0.9 1.0  PROT 5.6* 5.8* 5.6*  ALBUMIN 2.4* 2.4* 2.3*   Recent Labs    09/13/20 0103 09/14/20 0049 09/15/20 0103 09/16/20 0733 09/17/20 0113 09/18/20 0301 09/19/20 0114  WBC 7.6 7.8 7.4   < > 10.3 11.3* 11.9*  NEUTROABS 6.6 6.6 6.2  --   --   --   --   HGB 12.1* 12.0* 11.6*   < > 9.9* 11.0* 10.5*  HCT 36.5* 36.7* 35.1*   < > 29.5* 33.6* 31.4*  MCV 92.2 92.4 91.4   < > 89.7 92.1 91.5  PLT 85* 107* 104*   < > 142* 171 196   < > = values in this interval not displayed.   Lab Results  Component Value Date   TSH 4.33 10/18/2020   Lab Results  Component Value Date   HGBA1C 6.1 (H) 09/11/2020   Lab Results  Component Value Date   CHOL 141 07/26/2019   HDL 35.80 (L) 07/26/2019   LDLCALC 82 07/26/2019   LDLDIRECT 74.0 08/25/2015   TRIG 118.0 07/26/2019   CHOLHDL 4 07/26/2019     Assessment/Plan  1. ACP (advance care planning) -   Remains to be DNR -   Discussed medications, vital signs and weights  2. Atrial fibrillation with RVR (HCC) -    rate controlled, continue metoprolol tartrate for rate control and Eliquis for anticoagulation  3. Panlobular emphysema (HCC) -   No wheezing, continue Breo Ellipta and PRN albuterol HFA  4. Diabetes mellitus type 2 with complications (HCC) Lab Results  Component Value Date   HGBA1C 6.1 (H) 09/11/2020   -   CBGs stable, ranging from 93-188 -   Continue Metformin  5. Benign prostatic hyperplasia, unspecified whether lower urinary tract symptoms present -    continue tamsulosin  6. Adjustment disorder with mixed anxiety and depressed mood -   Continue trazodone, sertraline and valproic acid -    Followed by psych NP  7. Rash -Improving clotrimazole-betamethasone lotion   Family/ staff Communication: Discussed plan of care with resident, son and IDT.  Labs/tests ordered: None  Goals of care:   Short-term care   Durenda Age, DNP, MSN, FNP-BC [facility] and Adult Medicine [phone] (Monday-Friday 8:00 a.m. - 5:00 p.m.) [phone] (after hours)

## 2020-12-07 ENCOUNTER — Encounter: Payer: Self-pay | Admitting: Adult Health

## 2020-12-07 ENCOUNTER — Non-Acute Institutional Stay (SKILLED_NURSING_FACILITY): Payer: Medicare Other | Admitting: Adult Health

## 2020-12-07 DIAGNOSIS — J431 Panlobular emphysema: Secondary | ICD-10-CM | POA: Diagnosis not present

## 2020-12-07 DIAGNOSIS — F4323 Adjustment disorder with mixed anxiety and depressed mood: Secondary | ICD-10-CM

## 2020-12-07 DIAGNOSIS — U071 COVID-19: Secondary | ICD-10-CM | POA: Diagnosis not present

## 2020-12-07 DIAGNOSIS — N400 Enlarged prostate without lower urinary tract symptoms: Secondary | ICD-10-CM

## 2020-12-07 DIAGNOSIS — J9601 Acute respiratory failure with hypoxia: Secondary | ICD-10-CM | POA: Diagnosis not present

## 2020-12-07 DIAGNOSIS — I4891 Unspecified atrial fibrillation: Secondary | ICD-10-CM | POA: Diagnosis not present

## 2020-12-07 DIAGNOSIS — E118 Type 2 diabetes mellitus with unspecified complications: Secondary | ICD-10-CM | POA: Diagnosis not present

## 2020-12-07 DIAGNOSIS — F0150 Vascular dementia without behavioral disturbance: Secondary | ICD-10-CM

## 2020-12-07 NOTE — Progress Notes (Signed)
Location:  [place] Room Number: 115 A Place of Service:  SNF (31) Provider:  Durenda Age, DNP, FNP-BC  Patient Care Team: [name], MD as PCP - General  Extended Emergency Contact Information Primary Emergency Contact: [place] of [place] Phone: [phone] Mobile Phone: [phone] Relation: Son Secondary Emergency Contact: Dorothey Baseman Mobile Phone: [phone] Relation: Daughter  Code Status:  DNR   Goals of care: Advanced Directive information Advanced Directives 12/06/2020  Does Patient Have a Medical Advance Directive? No  Type of Paramedic of [place];Living will;Out of facility DNR (pink MOST or yellow form)  Does patient want to make changes to medical advance directive? No - Patient declined  Copy of [place] in Chart? Yes - validated most recent copy scanned in chart (See row information)  Would patient like information on creating a medical advance directive? -  Pre-existing out of facility DNR order (yellow form or pink MOST form) -     Chief Complaint  Patient presents with  . Discharge Note    For discharge to [facility]    HPI:  Pt is a 87 y.o. male who is for discharge to [facility].   He was admitted to [place] on 09/19/20. He has a PMH of COPD, CAD S/P CABG, hypertension, hyperlipidemia, diabetes mellitus type 2 and tobacco abuse.  He was brought to the hospital due to a fall and was short of breath.  He lives alone and son found him sitting in the tub and was short of breath.  EMS was called and was reportedly having O2 sat at 60% on room air.  He was found to have COVID-19 pneumonia and was treated with remdesivir, baricitinib and completed 5 days of Unasyn.  O2 at 2 L/minute with O2 sat 94%.  He is unvaccinated with COVID-19 vaccine.  Hospital course was complicated by development of atrial fibrillation  with RVR and aspiration pneumonia causing bronchospasm/COPD exacerbation.  Patient was admitted to this facility for short-term rehabilitation after the patient's recent hospitalization.  Patient has completed SNF rehabilitation and therapy has cleared the patient for discharge.   Past Medical History:  Diagnosis Date  . Aorto-iliac atherosclerosis ([place]) 08/2015   by xray  . Barrett's esophagus    EGD 2012, rpt 2014, longterm PPI  . Chronic idiopathic constipation 07/17/2010  . Chronic obstructive pulmonary disease (COPD) (HCC)    spirometry with moderate COPD  . Coronary atherosclerosis of artery bypass graft   . Diabetes mellitus type II ~2008  . Gastrointestinal ulcer 04/2011   with hematemesis s/p EGD by [facility]  . GERD (gastroesophageal reflux disease)   . Hearing loss   . HLD (hyperlipidemia)   . HNP (herniated nucleus pulposus), lumbar 2013   with compression of L5 nerve root and foot drop, also with lumbar DDD s/p surgery  . Hypertrophy of prostate without urinary obstruction and other lower urinary tract symptoms (LUTS)   . Memory loss   . Paroxysmal SVT (supraventricular tachycardia) ([place])    a. 3/14 => converted in ED with adenosine to AFib => NSR;  b. Echo 4/14:  Mild LVH, mild FBSH, EF 55-60%, Gr 1 DD  . Prostate nodule ~2009   Left-benign s/p eval Uro WNL ([place])  . Thrombocytopenia ([place])   . Tobacco use disorder    Past Surgical History:  Procedure Laterality Date  . COLONOSCOPY  03/2009   small hemorrhoids, 2 polyps (hyperplastic and adenomatous),  rpt 5 yrs (Dr. Barron Schmid) - pt declined rpt scheduling  . COLONOSCOPY  10/2014   2 hyperplastic polyp, hemmorhoids ([place])  . CORONARY ARTERY BYPASS GRAFT  1999   Dr. Roxy Manns  . ESOPHAGOGASTRODUODENOSCOPY  05/16/2011   after hematemesis, no lesions found, consistent with barrett's esophagus rpt 2 yrs  . ESOPHAGOGASTRODUODENOSCOPY  06/2013   barrett's, small HH, recheck 2-3 yrs ([place])  . HEMORRHOID SURGERY    . LUMBAR  LAMINECTOMY/DECOMPRESSION MICRODISCECTOMY  01/31/2012   Procedure: LUMBAR LAMINECTOMY/DECOMPRESSION MICRODISCECTOMY 1 LEVEL;  Surgeon: Winfield Cunas, MD;  Location: [place] [facility];  Service: Neurosurgery;  Laterality: Left;  LEFT Lumbar Four-Five Diskectomy  . NOSE SURGERY    . ROTATOR CUFF REPAIR  04/2010   right (but has bilat) Dr. Latanya Maudlin ([facility])    Allergies  Allergen Reactions  . Codeine     REACTION: vomiting  . Cyclobenzaprine Hcl     REACTION: vomiting  . Hydrocodone Nausea Only and Other (See Comments)    "crazy"  . Tramadol Other (See Comments)    woozy  . Valium Other (See Comments)    sedation    Outpatient Encounter Medications as of 12/07/2020  Medication Sig  . albuterol (VENTOLIN HFA) 108 (90 Base) MCG/ACT inhaler Inhale 2 puffs into the lungs every 6 (six) hours as needed for wheezing or shortness of breath.  [name] apixaban (ELIQUIS) 2.5 MG TABS tablet Take 1 tablet (2.5 mg total) by mouth 2 (two) times daily.  . clotrimazole-betamethasone (LOTRISONE) lotion Apply 1 application topically 2 (two) times daily. Apply to rash on trunk, axillae and groin for 7 days  . donepezil (ARICEPT) 10 MG tablet Take 1 tablet (10 mg total) by mouth at bedtime.  . fluticasone furoate-vilanterol (BREO ELLIPTA) 100-25 MCG/INH AEPB Inhale 1 puff into the lungs daily. For COPD  . hydrocortisone butyrate (LUCOID) 0.1 % CREA cream Apply 1 application topically 2 (two) times daily. Apply to hands, chest and back  . isosorbide mononitrate (IMDUR) 30 MG 24 hr tablet Take 1 tablet (30 mg total) by mouth daily.  . magnesium hydroxide (MILK OF MAGNESIA) 400 MG/5ML suspension Take 15 mLs by mouth daily as needed for moderate constipation.  . melatonin 5 MG TABS Take 5 mg by mouth at bedtime. For Insomnia  . memantine (NAMENDA) 10 MG tablet Take 1 tablet (10 mg total) by mouth 2 (two) times daily.  . metFORMIN (GLUCOPHAGE) 500 MG tablet Take 1 tablet (500 mg total) by mouth daily with breakfast.   . metoprolol tartrate (LOPRESSOR) 25 MG tablet TAKE ONE-HALF TABLET BY  MOUTH TWICE DAILY  . nicotine (NICODERM CQ - DOSED IN MG/24 HOURS) 21 mg/24hr patch Place 21 mg onto the skin daily.  . NON FORMULARY Med Pass 120 m l po three times a day for nutrition and to pervent wt loss  . omeprazole (PRILOSEC) 40 MG capsule TAKE 1 CAPSULE BY MOUTH  DAILY  . OXYGEN Inhale 2 L into the lungs continuous.  . sertraline (ZOLOFT) 50 MG tablet Take 75 mg by mouth daily.  . simvastatin (ZOCOR) 40 MG tablet TAKE 1 TABLET BY MOUTH AT  BEDTIME  . tamsulosin (FLOMAX) 0.4 MG CAPS capsule Take 1 capsule (0.4 mg total) by mouth daily.  . traZODone (DESYREL) 50 MG tablet Take 12.5 mg by mouth 2 (two) times daily.  [name] valproic acid (DEPAKENE) 250 MG/5ML solution Take 250 mg by mouth 4 (four) times daily.  . vitamin B-12 (CYANOCOBALAMIN) 500 MCG tablet Take 1 tablet (500  mcg total) by mouth every Monday, Wednesday, and Friday.   No facility-administered encounter medications on file as of 12/07/2020.    Review of Systems unable to obtain due to dementia    Immunization History  Administered Date(s) Administered  . Fluad Quad(high Dose 65+) 08/02/2019  . Influenza Whole 06/20/2010  . Influenza, High Dose Seasonal PF 06/02/2015, 07/04/2017, 06/15/2018  . Influenza, Seasonal, Injecte, Preservative Fre 06/16/2014  . Influenza-Unspecified 07/17/2016  . Pneumococcal Polysaccharide-23 09/17/2011  . Td 07/01/1999, 08/14/2010   Pertinent  Health Maintenance Due  Topic Date Due  . OPHTHALMOLOGY EXAM  06/16/2012  . COLONOSCOPY (Pts 45-24yrs Insurance coverage will need to be confirmed)  11/10/2019  . INFLUENZA VACCINE  04/16/2020  . URINE MICROALBUMIN  07/25/2020  . PNA vac Low Risk Adult (2 of 2 - PCV13) 12/14/2025 (Originally 09/16/2012)  . FOOT EXAM  01/23/2021  . HEMOGLOBIN A1C  03/12/2021   Fall Risk  04/10/2020 08/02/2019 07/07/2018 10/15/2017 10/01/2016  Falls in the past year? 0 1 No No No  Number falls in  past yr: - 0 - - -  Injury with Fall? - 0 - - -     Vitals:   12/07/20 1000  BP: (!) 132/52  Pulse: 65  Resp: 18  Temp: 97.7 F (36.5 C)  Weight: 155 lb 9.6 oz (70.6 kg)  Height: 5\' 8"  (1.727 m)   Body mass index is 23.66 kg/m.  Physical Exam  GENERAL APPEARANCE: Well nourished. In no acute distress. Normal body habitus SKIN:  Skin is warm and dry.  MOUTH and THROAT: Lips are without lesions. Oral mucosa is moist and without lesions.  RESPIRATORY: Breathing is even & unlabored, BS CTAB CARDIAC: RRR, no murmur,no extra heart sounds, no edema GI: Abdomen soft, normal BS, no masses, no tenderness, Extremities:  Able to move X 4 extremities. NEUROLOGICAL: There is no tremor. Speech is clear. Alert to self, disoriented to time and place. PSYCHIATRIC:  Affect and behavior are appropriate  Labs reviewed: Recent Labs    09/11/20 0913 09/11/20 1035 09/12/20 0110 09/13/20 0103 09/14/20 7846 09/15/20 0103 09/16/20 9629 09/16/20 0945 09/17/20 0113 09/18/20 0301 09/19/20 0114  NA  --    < > 142 140 141 145  --    < > 137 140 137  K  --    < > 4.8 4.9 4.8 5.1  --    < > 4.6 4.6 4.6  CL  --    < > 112* 114* 112* 116*  --    < > 111 110 111  CO2  --    < > 20* 16* 17* 18*  --    < > 17* 20* 19*  GLUCOSE  --    < > 117* 113* 113* 138*  --    < > 128* 127* 129*  BUN  --    < > 61* 51* 60* 63*  --    < > 42* 41* 37*  CREATININE  --    < > 1.99* 1.57* 1.85* 1.88*  --    < > 1.54* 1.65* 1.55*  CALCIUM  --    < > 8.5* 8.4* 8.6* 8.7*  --    < > 8.1* 8.3* 8.2*  MG 2.1  --  2.1 2.1 2.2 2.3 2.1  --   --   --   --   PHOS 4.1  --  3.1 2.4*  --   --   --   --   --   --   --    < > =  values in this interval not displayed.   Recent Labs    09/17/20 0113 09/18/20 0301 09/19/20 0114  AST 32 29 25  ALT 37 38 35  ALKPHOS 80 94 88  BILITOT 0.8 0.9 1.0  PROT 5.6* 5.8* 5.6*  ALBUMIN 2.4* 2.4* 2.3*   Recent Labs    09/13/20 0103 09/14/20 0049 09/15/20 0103 09/16/20 0733  09/17/20 0113 09/18/20 0301 09/19/20 0114  WBC 7.6 7.8 7.4   < > 10.3 11.3* 11.9*  NEUTROABS 6.6 6.6 6.2  --   --   --   --   HGB 12.1* 12.0* 11.6*   < > 9.9* 11.0* 10.5*  HCT 36.5* 36.7* 35.1*   < > 29.5* 33.6* 31.4*  MCV 92.2 92.4 91.4   < > 89.7 92.1 91.5  PLT 85* 107* 104*   < > 142* 171 196   < > = values in this interval not displayed.   Lab Results  Component Value Date   TSH 4.33 10/18/2020   Lab Results  Component Value Date   HGBA1C 6.1 (H) 09/11/2020   Lab Results  Component Value Date   CHOL 141 07/26/2019   HDL 35.80 (L) 07/26/2019   LDLCALC 82 07/26/2019   LDLDIRECT 74.0 08/25/2015   TRIG 118.0 07/26/2019   CHOLHDL 4 07/26/2019     Assessment/Plan  1. Atrial fibrillation with RVR (HCC) -Eliquis 2.5 mg 1 tab twice a day for anticoagulation and metoprolol tartrate 25 mg 1/2 tab = 12.5 mg twice a day and isosorbide mononitrate ER 30 mg daily  2. Diabetes mellitus type 2 with complications [facility]) Lab Results  Component Value Date   HGBA1C 6.1 (H) 09/11/2020   -Continue Metformin 500 mg daily with breakfast  3. Panlobular emphysema (HCC) -   Continue albuterol HFA 90 mcg inhaler inhale 2 puffs into the lungs every 6 hours PRN, continue 100-25 mcg INH inhale 1 puff into the lungs daily  4. Benign prostatic hyperplasia, unspecified whether lower urinary tract symptoms present -    continue tamsulosin 0.4 mg 1 capsule daily  5. Adjustment disorder with mixed anxiety and depressed mood -   Continue trazodone 50 mg 1/4 tab = 12.5 mg twice a day  6. Vascular dementia without behavioral disturbance (HCC) -   Continue memantine 10 mg 1 tab twice a day and donepezil 10 mg 1 tab at bedtime     I have filled out patient's discharge paperwork    DME provided:  None  Total discharge time: Greater than 30 minutes Greater than 50% was spent in counseling and coordination of care.    Discharge time involved coordination of the discharge process with social  worker, nursing staff and therapy department.   Durenda Age, DNP, MSN, FNP-BC [facility] and Adult Medicine [phone] (Monday-Friday 8:00 a.m. - 5:00 p.m.) [phone] (after hours)

## 2020-12-10 NOTE — Progress Notes (Incomplete)
Location:  [place] Room Number: 115 A Place of Service:  SNF (31) Provider:  Durenda Age, DNP, FNP-BC  Patient Care Team: [name], MD as PCP - General  Extended Emergency Contact Information Primary Emergency Contact: [place] Phone: [phone] Mobile Phone: [phone] Relation: Son Secondary Emergency Contact: Dorothey Baseman Mobile Phone: [phone] Relation: Daughter  Code Status:  DNR   Goals of care: Advanced Directive information Advanced Directives 12/06/2020  Does Patient Have a Medical Advance Directive? No  Type of Paramedic of [place];Living will;Out of facility DNR (pink MOST or yellow form)  Does patient want to make changes to medical advance directive? No - Patient declined  Copy of [place] in Chart? Yes - validated most recent copy scanned in chart (See row information)  Would patient like information on creating a medical advance directive? -  Pre-existing out of facility DNR order (yellow form or pink MOST form) -     Chief Complaint  Patient presents with  . Discharge Note    For discharge to [facility]    HPI:  Pt is a 87 y.o. male who is for discharge to [facility].   He was admitted to [facility] on 09/19/20. He has a PMH of COPD, CAD S/P CABG, hypertension, hyperlipidemia, diabetes mellitus type 2 and tobacco abuse.  He was brought to the hospital due to a fall and was short of breath.  He lives alone and son found him sitting in the tub and was short of breath.  EMS was called and was reportedly having O2 sat at 60% on room air.  He was found to have COVID-19 pneumonia and was treated with remdesivir, baricitinib and completed 5 days of Unasyn.  O2 at 2 L/minute with O2 sat 94%.  He is unvaccinated with COVID-19 vaccine.  Hospital course was complicated by development of atrial fibrillation  with RVR and aspiration pneumonia causing bronchospasm/COPD exacerbation.  Patient was admitted to this facility for short-term rehabilitation after the patient's recent hospitalization.  Patient has completed SNF rehabilitation and therapy has cleared the patient for discharge.   Past Medical History:  Diagnosis Date  . Aorto-iliac atherosclerosis ([place]) 08/2015   by xray  . Barrett's esophagus    EGD 2012, rpt 2014, longterm PPI  . Chronic idiopathic constipation 07/17/2010  . Chronic obstructive pulmonary disease (COPD) ([facility])    spirometry with moderate COPD  . Coronary atherosclerosis of artery bypass graft   . Diabetes mellitus type II ~2008  . Gastrointestinal ulcer 04/2011   with hematemesis s/p EGD by [facility]  . GERD (gastroesophageal reflux disease)   . Hearing loss   . HLD (hyperlipidemia)   . HNP (herniated nucleus pulposus), lumbar 2013   with compression of L5 nerve root and foot drop, also with lumbar DDD s/p surgery  . Hypertrophy of prostate without urinary obstruction and other lower urinary tract symptoms (LUTS)   . Memory loss   . Paroxysmal SVT (supraventricular tachycardia) ([place])    a. 3/14 => converted in ED with adenosine to AFib => NSR;  b. Echo 4/14:  Mild LVH, mild FBSH, EF 55-60%, Gr 1 DD  . Prostate nodule ~2009   Left-benign s/p eval Uro WNL ([place])  . Thrombocytopenia ([place])   . Tobacco use disorder    Past Surgical History:  Procedure Laterality Date  . COLONOSCOPY  03/2009   small hemorrhoids, 2 polyps (hyperplastic and adenomatous),  rpt 5 yrs (Dr. Barron Schmid) - pt declined rpt scheduling  . COLONOSCOPY  10/2014   2 hyperplastic polyp, hemmorhoids ([place])  . CORONARY ARTERY BYPASS GRAFT  1999   Dr. Roxy Manns  . ESOPHAGOGASTRODUODENOSCOPY  05/16/2011   after hematemesis, no lesions found, consistent with barrett's esophagus rpt 2 yrs  . ESOPHAGOGASTRODUODENOSCOPY  06/2013   barrett's, small HH, recheck 2-3 yrs ([place])  . HEMORRHOID SURGERY    . LUMBAR  LAMINECTOMY/DECOMPRESSION MICRODISCECTOMY  01/31/2012   Procedure: LUMBAR LAMINECTOMY/DECOMPRESSION MICRODISCECTOMY 1 LEVEL;  Surgeon: Winfield Cunas, MD;  Location: [facility];  Service: Neurosurgery;  Laterality: Left;  LEFT Lumbar Four-Five Diskectomy  . NOSE SURGERY    . ROTATOR CUFF REPAIR  04/2010   right (but has bilat) Dr. Latanya Maudlin ([facility])    Allergies  Allergen Reactions  . Codeine     REACTION: vomiting  . Cyclobenzaprine Hcl     REACTION: vomiting  . Hydrocodone Nausea Only and Other (See Comments)    "crazy"  . Tramadol Other (See Comments)    woozy  . Valium Other (See Comments)    sedation    Outpatient Encounter Medications as of 12/07/2020  Medication Sig  . albuterol (VENTOLIN HFA) 108 (90 Base) MCG/ACT inhaler Inhale 2 puffs into the lungs every 6 (six) hours as needed for wheezing or shortness of breath.  [name] apixaban (ELIQUIS) 2.5 MG TABS tablet Take 1 tablet (2.5 mg total) by mouth 2 (two) times daily.  . clotrimazole-betamethasone (LOTRISONE) lotion Apply 1 application topically 2 (two) times daily. Apply to rash on trunk, axillae and groin for 7 days  . donepezil (ARICEPT) 10 MG tablet Take 1 tablet (10 mg total) by mouth at bedtime.  . fluticasone furoate-vilanterol (BREO ELLIPTA) 100-25 MCG/INH AEPB Inhale 1 puff into the lungs daily. For COPD  . hydrocortisone butyrate (LUCOID) 0.1 % CREA cream Apply 1 application topically 2 (two) times daily. Apply to hands, chest and back  . isosorbide mononitrate (IMDUR) 30 MG 24 hr tablet Take 1 tablet (30 mg total) by mouth daily.  . magnesium hydroxide (MILK OF MAGNESIA) 400 MG/5ML suspension Take 15 mLs by mouth daily as needed for moderate constipation.  . melatonin 5 MG TABS Take 5 mg by mouth at bedtime. For Insomnia  . memantine (NAMENDA) 10 MG tablet Take 1 tablet (10 mg total) by mouth 2 (two) times daily.  . metFORMIN (GLUCOPHAGE) 500 MG tablet Take 1 tablet (500 mg total) by mouth daily with breakfast.   . metoprolol tartrate (LOPRESSOR) 25 MG tablet TAKE ONE-HALF TABLET BY  MOUTH TWICE DAILY  . nicotine (NICODERM CQ - DOSED IN MG/24 HOURS) 21 mg/24hr patch Place 21 mg onto the skin daily.  . NON FORMULARY Med Pass 120 m l po three times a day for nutrition and to pervent wt loss  . omeprazole (PRILOSEC) 40 MG capsule TAKE 1 CAPSULE BY MOUTH  DAILY  . OXYGEN Inhale 2 L into the lungs continuous.  . sertraline (ZOLOFT) 50 MG tablet Take 75 mg by mouth daily.  . simvastatin (ZOCOR) 40 MG tablet TAKE 1 TABLET BY MOUTH AT  BEDTIME  . tamsulosin (FLOMAX) 0.4 MG CAPS capsule Take 1 capsule (0.4 mg total) by mouth daily.  . traZODone (DESYREL) 50 MG tablet Take 12.5 mg by mouth 2 (two) times daily.  [name] valproic acid (DEPAKENE) 250 MG/5ML solution Take 250 mg by mouth 4 (four) times daily.  . vitamin B-12 (CYANOCOBALAMIN) 500 MCG tablet Take 1 tablet (500  mcg total) by mouth every Monday, Wednesday, and Friday.   No facility-administered encounter medications on file as of 12/07/2020.    Review of Systems unable to obtain due to dementia    Immunization History  Administered Date(s) Administered  . Fluad Quad(high Dose 65+) 08/02/2019  . Influenza Whole 06/20/2010  . Influenza, High Dose Seasonal PF 06/02/2015, 07/04/2017, 06/15/2018  . Influenza, Seasonal, Injecte, Preservative Fre 06/16/2014  . Influenza-Unspecified 07/17/2016  . Pneumococcal Polysaccharide-23 09/17/2011  . Td 07/01/1999, 08/14/2010   Pertinent  Health Maintenance Due  Topic Date Due  . OPHTHALMOLOGY EXAM  06/16/2012  . COLONOSCOPY (Pts 45-6yrs Insurance coverage will need to be confirmed)  11/10/2019  . INFLUENZA VACCINE  04/16/2020  . URINE MICROALBUMIN  07/25/2020  . PNA vac Low Risk Adult (2 of 2 - PCV13) 12/14/2025 (Originally 09/16/2012)  . FOOT EXAM  01/23/2021  . HEMOGLOBIN A1C  03/12/2021   Fall Risk  04/10/2020 08/02/2019 07/07/2018 10/15/2017 10/01/2016  Falls in the past year? 0 1 No No No  Number falls in  past yr: - 0 - - -  Injury with Fall? - 0 - - -     Vitals:   12/07/20 1000  BP: (!) 132/52  Pulse: 65  Resp: 18  Temp: 97.7 F (36.5 C)  Weight: 155 lb 9.6 oz (70.6 kg)  Height: 5\' 8"  (1.727 m)   Body mass index is 23.66 kg/m.  Physical Exam  GENERAL APPEARANCE: Well nourished. In no acute distress. Normal body habitus SKIN:  Skin is warm and dry.  MOUTH and THROAT: Lips are without lesions. Oral mucosa is moist and without lesions.  RESPIRATORY: Breathing is even & unlabored, BS CTAB CARDIAC: RRR, no murmur,no extra heart sounds, no edema GI: Abdomen soft, normal BS, no masses, no tenderness, no hepatomegaly, no splenomegaly MUSCULOSKELETAL: No deformities. Movement at each extremity is full and painless. Strength is 5/5 at each extremity. Back is without kyphosis or scoliosis CIRCULATION: Pedal pulses are 2+. There is no edema of the legs, ankles and feet NEUROLOGICAL: There is no tremor. Speech is clear PSYCHIATRIC: Alert and oriented X 3. Affect and behavior are appropriate  Labs reviewed: Recent Labs    09/11/20 0913 09/11/20 1035 09/12/20 0110 09/13/20 0103 09/14/20 0049 09/15/20 0103 09/16/20 5284 09/16/20 0945 09/17/20 0113 09/18/20 0301 09/19/20 0114  NA  --    < > 142 140 141 145  --    < > 137 140 137  K  --    < > 4.8 4.9 4.8 5.1  --    < > 4.6 4.6 4.6  CL  --    < > 112* 114* 112* 116*  --    < > 111 110 111  CO2  --    < > 20* 16* 17* 18*  --    < > 17* 20* 19*  GLUCOSE  --    < > 117* 113* 113* 138*  --    < > 128* 127* 129*  BUN  --    < > 61* 51* 60* 63*  --    < > 42* 41* 37*  CREATININE  --    < > 1.99* 1.57* 1.85* 1.88*  --    < > 1.54* 1.65* 1.55*  CALCIUM  --    < > 8.5* 8.4* 8.6* 8.7*  --    < > 8.1* 8.3* 8.2*  MG 2.1  --  2.1 2.1 2.2 2.3 2.1  --   --   --   --  PHOS 4.1  --  3.1 2.4*  --   --   --   --   --   --   --    < > = values in this interval not displayed.   Recent Labs    09/17/20 0113 09/18/20 0301 09/19/20 0114  AST 32  29 25  ALT 37 38 35  ALKPHOS 80 94 88  BILITOT 0.8 0.9 1.0  PROT 5.6* 5.8* 5.6*  ALBUMIN 2.4* 2.4* 2.3*   Recent Labs    09/13/20 0103 09/14/20 0049 09/15/20 0103 09/16/20 0733 09/17/20 0113 09/18/20 0301 09/19/20 0114  WBC 7.6 7.8 7.4   < > 10.3 11.3* 11.9*  NEUTROABS 6.6 6.6 6.2  --   --   --   --   HGB 12.1* 12.0* 11.6*   < > 9.9* 11.0* 10.5*  HCT 36.5* 36.7* 35.1*   < > 29.5* 33.6* 31.4*  MCV 92.2 92.4 91.4   < > 89.7 92.1 91.5  PLT 85* 107* 104*   < > 142* 171 196   < > = values in this interval not displayed.   Lab Results  Component Value Date   TSH 4.33 10/18/2020   Lab Results  Component Value Date   HGBA1C 6.1 (H) 09/11/2020   Lab Results  Component Value Date   CHOL 141 07/26/2019   HDL 35.80 (L) 07/26/2019   LDLCALC 82 07/26/2019   LDLDIRECT 74.0 08/25/2015   TRIG 118.0 07/26/2019   CHOLHDL 4 07/26/2019    Significant Diagnostic Results in last 30 days:  No results found.  Assessment/Plan    Family/ staff Communication:   Labs/tests ordered:    Goals of care:      Durenda Age, DNP, MSN, FNP-BC [facility] [phone] (Monday-Friday 8:00 a.m. - 5:00 p.m.) [phone] (after hours)

## 2020-12-11 ENCOUNTER — Telehealth: Payer: Self-pay | Admitting: Family Medicine

## 2020-12-11 NOTE — Telephone Encounter (Signed)
Patient came with paperwork from Department of [facility] to be fill out . It was place in mail tower       .

## 2020-12-12 ENCOUNTER — Telehealth: Payer: Self-pay | Admitting: Family Medicine

## 2020-12-12 NOTE — Telephone Encounter (Signed)
Placed form in Dr. G's box.  

## 2020-12-13 NOTE — Telephone Encounter (Signed)
E-scribed refill.  Plz schedule wellness, lab and cpe visits.  

## 2020-12-14 ENCOUNTER — Other Ambulatory Visit: Payer: Self-pay

## 2020-12-14 ENCOUNTER — Encounter (HOSPITAL_COMMUNITY): Payer: Self-pay

## 2020-12-14 ENCOUNTER — Emergency Department (HOSPITAL_COMMUNITY)
Admission: EM | Admit: 2020-12-14 | Discharge: 2020-12-14 | Disposition: A | Discharge status: Home / Self Care | Payer: Medicare Other | Attending: Emergency Medicine | Admitting: Emergency Medicine

## 2020-12-14 ENCOUNTER — Emergency Department (HOSPITAL_COMMUNITY): Payer: Medicare Other

## 2020-12-14 DIAGNOSIS — Z79899 Other long term (current) drug therapy: Secondary | ICD-10-CM | POA: Diagnosis not present

## 2020-12-14 DIAGNOSIS — R109 Unspecified abdominal pain: Secondary | ICD-10-CM | POA: Diagnosis not present

## 2020-12-14 DIAGNOSIS — I714 Abdominal aortic aneurysm, without rupture, unspecified: Secondary | ICD-10-CM

## 2020-12-14 DIAGNOSIS — E1169 Type 2 diabetes mellitus with other specified complication: Secondary | ICD-10-CM | POA: Diagnosis not present

## 2020-12-14 DIAGNOSIS — J449 Chronic obstructive pulmonary disease, unspecified: Secondary | ICD-10-CM | POA: Insufficient documentation

## 2020-12-14 DIAGNOSIS — J439 Emphysema, unspecified: Secondary | ICD-10-CM | POA: Diagnosis not present

## 2020-12-14 DIAGNOSIS — I25810 Atherosclerosis of coronary artery bypass graft(s) without angina pectoris: Secondary | ICD-10-CM | POA: Insufficient documentation

## 2020-12-14 DIAGNOSIS — E785 Hyperlipidemia, unspecified: Secondary | ICD-10-CM | POA: Diagnosis not present

## 2020-12-14 DIAGNOSIS — Z8616 Personal history of COVID-19: Secondary | ICD-10-CM | POA: Diagnosis not present

## 2020-12-14 DIAGNOSIS — I130 Hypertensive heart and chronic kidney disease with heart failure and stage 1 through stage 4 chronic kidney disease, or unspecified chronic kidney disease: Secondary | ICD-10-CM | POA: Diagnosis not present

## 2020-12-14 DIAGNOSIS — F0390 Unspecified dementia without behavioral disturbance: Secondary | ICD-10-CM | POA: Insufficient documentation

## 2020-12-14 DIAGNOSIS — I5032 Chronic diastolic (congestive) heart failure: Secondary | ICD-10-CM | POA: Diagnosis not present

## 2020-12-14 DIAGNOSIS — Z7984 Long term (current) use of oral hypoglycemic drugs: Secondary | ICD-10-CM | POA: Diagnosis not present

## 2020-12-14 DIAGNOSIS — R140 Abdominal distension (gaseous): Secondary | ICD-10-CM | POA: Diagnosis not present

## 2020-12-14 DIAGNOSIS — E1136 Type 2 diabetes mellitus with diabetic cataract: Secondary | ICD-10-CM | POA: Insufficient documentation

## 2020-12-14 DIAGNOSIS — E1122 Type 2 diabetes mellitus with diabetic chronic kidney disease: Secondary | ICD-10-CM | POA: Diagnosis not present

## 2020-12-14 DIAGNOSIS — K219 Gastro-esophageal reflux disease without esophagitis: Secondary | ICD-10-CM | POA: Insufficient documentation

## 2020-12-14 DIAGNOSIS — N183 Chronic kidney disease, stage 3 unspecified: Secondary | ICD-10-CM | POA: Diagnosis not present

## 2020-12-14 DIAGNOSIS — R911 Solitary pulmonary nodule: Secondary | ICD-10-CM | POA: Diagnosis not present

## 2020-12-14 DIAGNOSIS — R1084 Generalized abdominal pain: Secondary | ICD-10-CM | POA: Diagnosis not present

## 2020-12-14 DIAGNOSIS — N323 Diverticulum of bladder: Secondary | ICD-10-CM | POA: Diagnosis not present

## 2020-12-14 DIAGNOSIS — R6889 Other general symptoms and signs: Secondary | ICD-10-CM | POA: Diagnosis not present

## 2020-12-14 DIAGNOSIS — R059 Cough, unspecified: Secondary | ICD-10-CM | POA: Diagnosis not present

## 2020-12-14 DIAGNOSIS — I10 Essential (primary) hypertension: Secondary | ICD-10-CM | POA: Diagnosis not present

## 2020-12-14 DIAGNOSIS — F17210 Nicotine dependence, cigarettes, uncomplicated: Secondary | ICD-10-CM | POA: Insufficient documentation

## 2020-12-14 DIAGNOSIS — Z7901 Long term (current) use of anticoagulants: Secondary | ICD-10-CM | POA: Insufficient documentation

## 2020-12-14 DIAGNOSIS — Z743 Need for continuous supervision: Secondary | ICD-10-CM | POA: Diagnosis not present

## 2020-12-14 DIAGNOSIS — E278 Other specified disorders of adrenal gland: Secondary | ICD-10-CM | POA: Diagnosis not present

## 2020-12-14 LAB — COMPREHENSIVE METABOLIC PANEL
ALT: 24 U/L (ref 0–44)
AST: 22 U/L (ref 15–41)
Albumin: 3.4 g/dL — ABNORMAL LOW (ref 3.5–5.0)
Alkaline Phosphatase: 87 U/L (ref 38–126)
Anion gap: 10 (ref 5–15)
BUN: 34 mg/dL — ABNORMAL HIGH (ref 8–23)
CO2: 20 mmol/L — ABNORMAL LOW (ref 22–32)
Calcium: 8.9 mg/dL (ref 8.9–10.3)
Chloride: 108 mmol/L (ref 98–111)
Creatinine, Ser: 1.67 mg/dL — ABNORMAL HIGH (ref 0.61–1.24)
GFR, Estimated: 40 mL/min — ABNORMAL LOW (ref >60)
Glucose, Bld: 113 mg/dL — ABNORMAL HIGH (ref 70–99)
Potassium: 4.6 mmol/L (ref 3.5–5.1)
Sodium: 138 mmol/L (ref 135–145)
Total Bilirubin: 0.7 mg/dL (ref 0.3–1.2)
Total Protein: 7.6 g/dL (ref 6.5–8.1)

## 2020-12-14 LAB — URINALYSIS, ROUTINE W REFLEX MICROSCOPIC
Bacteria, UA: NONE SEEN
Bilirubin Urine: NEGATIVE
Glucose, UA: NEGATIVE mg/dL
Hgb urine dipstick: NEGATIVE
Ketones, ur: 5 mg/dL — AB
Leukocytes,Ua: NEGATIVE
Nitrite: NEGATIVE
Protein, ur: 30 mg/dL — AB
Specific Gravity, Urine: 1.024 (ref 1.005–1.030)
pH: 5 (ref 5.0–8.0)

## 2020-12-14 LAB — CBC WITH DIFFERENTIAL/PLATELET
Abs Immature Granulocytes: 0.1 10*3/uL — ABNORMAL HIGH (ref 0.00–0.07)
Basophils Absolute: 0 10*3/uL (ref 0.0–0.1)
Basophils Relative: 0 %
Eosinophils Absolute: 0 10*3/uL (ref 0.0–0.5)
Eosinophils Relative: 0 %
HCT: 33.3 % — ABNORMAL LOW (ref 39.0–52.0)
Hemoglobin: 10.6 g/dL — ABNORMAL LOW (ref 13.0–17.0)
Immature Granulocytes: 1 %
Lymphocytes Relative: 20 %
Lymphs Abs: 3 10*3/uL (ref 0.7–4.0)
MCH: 30.1 pg (ref 26.0–34.0)
MCHC: 31.8 g/dL (ref 30.0–36.0)
MCV: 94.6 fL (ref 80.0–100.0)
Monocytes Absolute: 1.5 10*3/uL — ABNORMAL HIGH (ref 0.1–1.0)
Monocytes Relative: 10 %
Neutro Abs: 10.4 10*3/uL — ABNORMAL HIGH (ref 1.7–7.7)
Neutrophils Relative %: 69 %
Platelets: 138 10*3/uL — ABNORMAL LOW (ref 150–400)
RBC: 3.52 MIL/uL — ABNORMAL LOW (ref 4.22–5.81)
RDW: 14.3 % (ref 11.5–15.5)
WBC: 15.1 10*3/uL — ABNORMAL HIGH (ref 4.0–10.5)
nRBC: 0 % (ref 0.0–0.2)

## 2020-12-14 LAB — BRAIN NATRIURETIC PEPTIDE: B Natriuretic Peptide: 111.4 pg/mL — ABNORMAL HIGH (ref 0.0–100.0)

## 2020-12-14 LAB — TROPONIN I (HIGH SENSITIVITY)
Troponin I (High Sensitivity): 10 ng/L (ref <18)
Troponin I (High Sensitivity): 11 ng/L (ref <18)

## 2020-12-14 LAB — LIPASE, BLOOD: Lipase: 25 U/L (ref 11–51)

## 2020-12-14 MED ORDER — SODIUM CHLORIDE 0.9 % IV BOLUS
500.0000 mL | Freq: Once | INTRAVENOUS | Status: AC
  Administered 2020-12-14: 500 mL via INTRAVENOUS

## 2020-12-14 NOTE — ED Notes (Signed)
Patient yelling/ confused did not know where he was or why he was here. He just kept repeating the same question. Bed alarm on

## 2020-12-14 NOTE — ED Provider Notes (Signed)
[facility] DEPT Provider Note   CSN: [id number] Arrival date & time: 12/14/20  1414     History No chief complaint on file.   Troy [name] is a 87 y.o. male.  Level 5 caveat secondary to dementia.  Resident of [facility] here for evaluation of poor p.o. intake and abdominal distention since yesterday.  Patient denies any complaints.  He has no idea why he is here.  The history is provided by the patient and the EMS personnel.  Abdominal Pain Pain location:  Generalized Pain radiates to:  Does not radiate Pain severity:  No pain Context: not trauma   Relieved by:  None tried Worsened by:  Nothing Ineffective treatments:  None tried      Past Medical History:  Diagnosis Date  . Aorto-iliac atherosclerosis ([place]) 08/2015   by xray  . Barrett's esophagus    EGD 2012, rpt 2014, longterm PPI  . Chronic idiopathic constipation 07/17/2010  . Chronic obstructive pulmonary disease (COPD) (HCC)    spirometry with moderate COPD  . Coronary atherosclerosis of artery bypass graft   . Diabetes mellitus type II ~2008  . Gastrointestinal ulcer 04/2011   with hematemesis s/p EGD by [facility]  . GERD (gastroesophageal reflux disease)   . Hearing loss   . HLD (hyperlipidemia)   . HNP (herniated nucleus pulposus), lumbar 2013   with compression of L5 nerve root and foot drop, also with lumbar DDD s/p surgery  . Hypertrophy of prostate without urinary obstruction and other lower urinary tract symptoms (LUTS)   . Memory loss   . Paroxysmal SVT (supraventricular tachycardia) ([place])    a. 3/14 => converted in ED with adenosine to AFib => NSR;  b. Echo 4/14:  Mild LVH, mild FBSH, EF 55-60%, Gr 1 DD  . Prostate nodule ~2009   Left-benign s/p eval Uro WNL ([place])  . Thrombocytopenia ([place])   . Tobacco use disorder     Patient Active Problem List   Diagnosis Date Noted  . Dermatitis 12/06/2020  . DNR (do not resuscitate) 11/28/2020  . CKD (chronic kidney  disease), stage III ([place]) 10/17/2020  . Unspecified protein-calorie malnutrition ([place]) 10/17/2020  . Atrial fibrillation with RVR ([place]) 09/21/2020  . Dysphagia 09/21/2020  . Severe hypoxemia 09/10/2020  . Acute respiratory disease due to COVID-19 virus 09/10/2020  . Acute respiratory failure with hypoxemia ([place]) 09/10/2020  . AKI (acute kidney injury) ([place]) 09/10/2020  . Chronic diastolic CHF (congestive heart failure) ([place]) 09/10/2020  . Cerebral vascular disease 04/10/2020  . Diabetic cataract of both eyes ([place]) 01/24/2020  . Vitamin B12 deficiency 08/03/2019  . History of stroke 08/03/2019  . Pseudodementia 10/01/2018  . Sensorineural hearing loss (SNHL) of both ears 12/31/2017  . Candidal intertrigo 12/22/2017  . Left serous otitis media 07/04/2017  . Hypertension associated with diabetes ([place]) 05/22/2017  . Eustachian tube dysfunction, left 04/04/2017  . Dry skin dermatitis 12/02/2016  . Health maintenance examination 10/08/2016  . Advanced care planning/counseling discussion 10/08/2016  . Thyromegaly 10/08/2016  . Anemia 10/08/2016  . Aorto-iliac atherosclerosis ([place]) 08/17/2015  . Bleeding hemorrhoid 06/13/2015  . Dementia without behavioral disturbance ([place]) 06/13/2015  . Orthostatic dizziness 08/01/2014  . Paresthesia of foot, bilateral 08/01/2014  . Supraventricular tachycardia ([place]) 11/24/2012  . CAD (coronary artery disease) 11/24/2012  . COPD exacerbation ([place]) 11/10/2012  . Lumbosacral radiculopathy at L5 12/23/2011  . Thrombocytopenia ([place])   . Barrett's esophagus   . Skin lesion of back 09/16/2011  . PUD (peptic  ulcer disease) 05/16/2011  . Medicare annual wellness visit, subsequent 04/30/2011  . COPD (chronic obstructive pulmonary disease) ([place]) 12/26/2010  . Diabetes mellitus type 2 with complications ([place]) 67/08/4579  . Chronic idiopathic constipation 07/17/2010  . Hyperlipidemia associated with type 2 diabetes mellitus ([place]) 05/13/2009  . Tobacco use  05/13/2009    Past Surgical History:  Procedure Laterality Date  . COLONOSCOPY  03/2009   small hemorrhoids, 2 polyps (hyperplastic and adenomatous), rpt 5 yrs (Dr. Barron Schmid) - pt declined rpt scheduling  . COLONOSCOPY  10/2014   2 hyperplastic polyp, hemmorhoids ([place])  . CORONARY ARTERY BYPASS GRAFT  1999   Dr. Roxy Manns  . ESOPHAGOGASTRODUODENOSCOPY  05/16/2011   after hematemesis, no lesions found, consistent with barrett's esophagus rpt 2 yrs  . ESOPHAGOGASTRODUODENOSCOPY  06/2013   barrett's, small HH, recheck 2-3 yrs ([place])  . HEMORRHOID SURGERY    . LUMBAR LAMINECTOMY/DECOMPRESSION MICRODISCECTOMY  01/31/2012   Procedure: LUMBAR LAMINECTOMY/DECOMPRESSION MICRODISCECTOMY 1 LEVEL;  Surgeon: Winfield Cunas, MD;  Location: [facility];  Service: Neurosurgery;  Laterality: Left;  LEFT Lumbar Four-Five Diskectomy  . NOSE SURGERY    . ROTATOR CUFF REPAIR  04/2010   right (but has bilat) Dr. Latanya Maudlin ([facility])       Family History  Problem Relation Age of Onset  . Cancer Father 29       colon  . Memory loss Mother     Social History   Tobacco Use  . Smoking status: Current Every Day Smoker    Packs/day: 1.00    Years: 60.00    Pack years: 60.00    Types: Cigarettes    Start date: 09/16/1952  . Smokeless tobacco: Current User    Types: Snuff  . Tobacco comment: < 1 ppd; does dip sometimes  Vaping Use  . Vaping Use: Never used  Substance Use Topics  . Alcohol use: Yes  . Drug use: No    Home Medications Prior to Admission medications   Medication Sig Start Date End Date Taking? Authorizing Provider  albuterol (VENTOLIN HFA) 108 (90 Base) MCG/ACT inhaler Inhale 2 puffs into the lungs every 6 (six) hours as needed for wheezing or shortness of breath. 08/06/19   [name], MD  apixaban (ELIQUIS) 2.5 MG TABS tablet Take 1 tablet (2.5 mg total) by mouth 2 (two) times daily. 09/19/20   Thurnell Lose, MD  clotrimazole-betamethasone (LOTRISONE) lotion Apply 1  application topically 2 (two) times daily. Apply to rash on trunk, axillae and groin for 7 days    [provider]  donepezil (ARICEPT) 10 MG tablet Take 1 tablet (10 mg total) by mouth at bedtime. 04/10/20   Marcial Pacas, MD  fluticasone furoate-vilanterol (BREO ELLIPTA) 100-25 MCG/INH AEPB Inhale 1 puff into the lungs daily. For COPD    [provider]  hydrocortisone butyrate (LUCOID) 0.1 % CREA cream Apply 1 application topically 2 (two) times daily. Apply to hands, chest and back    [provider]  isosorbide mononitrate (IMDUR) 30 MG 24 hr tablet TAKE 1 TABLET BY MOUTH  DAILY 12/13/20   [name], MD  magnesium hydroxide (MILK OF MAGNESIA) 400 MG/5ML suspension Take 15 mLs by mouth daily as needed for moderate constipation. 11/01/14   [name], MD  melatonin 5 MG TABS Take 5 mg by mouth at bedtime. For Insomnia    [provider]  memantine (NAMENDA) 10 MG tablet Take 1 tablet (10 mg total) by mouth 2 (two) times daily. 04/10/20  Marcial Pacas, MD  metFORMIN (GLUCOPHAGE) 500 MG tablet Take 1 tablet (500 mg total) by mouth daily with breakfast. 01/24/20   [name], MD  metoprolol tartrate (LOPRESSOR) 25 MG tablet TAKE ONE-HALF TABLET BY  MOUTH TWICE DAILY 11/21/20   [name], MD  nicotine (NICODERM CQ - DOSED IN MG/24 HOURS) 21 mg/24hr patch Place 21 mg onto the skin daily.    [provider]  NON FORMULARY Med Pass 120 m l po three times a day for nutrition and to pervent wt loss    [provider]  omeprazole (PRILOSEC) 40 MG capsule TAKE 1 CAPSULE BY MOUTH  DAILY 11/21/20   [name], MD  OXYGEN Inhale 2 L into the lungs continuous.    [provider]  sertraline (ZOLOFT) 50 MG tablet Take 75 mg by mouth daily.    [provider]  simvastatin (ZOCOR) 40 MG tablet TAKE 1 TABLET BY MOUTH AT  BEDTIME 11/06/20   [name], MD  tamsulosin (FLOMAX) 0.4 MG CAPS capsule Take 1 capsule (0.4 mg  total) by mouth daily. 09/20/20   Thurnell Lose, MD  traZODone (DESYREL) 50 MG tablet Take 12.5 mg by mouth 2 (two) times daily.    [provider]  valproic acid (DEPAKENE) 250 MG/5ML solution Take 250 mg by mouth 4 (four) times daily.    [provider]  vitamin B-12 (CYANOCOBALAMIN) 500 MCG tablet Take 1 tablet (500 mcg total) by mouth every Monday, Wednesday, and Friday. 01/28/20   [name], MD    Allergies    Codeine, Cyclobenzaprine hcl, Hydrocodone, Tramadol, and Valium  Review of Systems   Review of Systems  Unable to perform ROS: Dementia  Gastrointestinal: Positive for abdominal pain.    Physical Exam Updated Vital Signs BP 110/64   Pulse 83   Temp 98.7 F (37.1 C) (Oral)   Resp 16   SpO2 98%   Physical Exam Vitals and nursing note reviewed.  Constitutional:      General: He is not in acute distress.    Appearance: Normal appearance. He is well-developed.  HENT:     Head: Normocephalic and atraumatic.  Eyes:     Conjunctiva/sclera: Conjunctivae normal.  Cardiovascular:     Rate and Rhythm: Normal rate and regular rhythm.     Heart sounds: No murmur heard.   Pulmonary:     Effort: Pulmonary effort is normal. No respiratory distress.     Breath sounds: Normal breath sounds.  Abdominal:     Palpations: Abdomen is soft.     Tenderness: There is no abdominal tenderness. There is no guarding or rebound.  Musculoskeletal:        General: No deformity or signs of injury. Normal range of motion.     Cervical back: Neck supple.  Skin:    General: Skin is warm and dry.  Neurological:     General: No focal deficit present.     Mental Status: He is alert.     ED Results / Procedures / Treatments   Labs (all labs ordered are listed, but only abnormal results are displayed) Labs Reviewed  CBC WITH DIFFERENTIAL/PLATELET - Abnormal; Notable for the following components:      Result Value   WBC 15.1 (*)    RBC 3.52 (*)    Hemoglobin  10.6 (*)    HCT 33.3 (*)    Platelets 138 (*)    Neutro Abs 10.4 (*)    Monocytes Absolute 1.5 (*)  Abs Immature Granulocytes 0.10 (*)    All other components within normal limits  COMPREHENSIVE METABOLIC PANEL - Abnormal; Notable for the following components:   CO2 20 (*)    Glucose, Bld 113 (*)    BUN 34 (*)    Creatinine, Ser 1.67 (*)    Albumin 3.4 (*)    GFR, Estimated 40 (*)    All other components within normal limits  URINALYSIS, ROUTINE W REFLEX MICROSCOPIC - Abnormal; Notable for the following components:   Ketones, ur 5 (*)    Protein, ur 30 (*)    All other components within normal limits  BRAIN NATRIURETIC PEPTIDE - Abnormal; Notable for the following components:   B Natriuretic Peptide 111.4 (*)    All other components within normal limits  LIPASE, BLOOD  TROPONIN I (HIGH SENSITIVITY)  TROPONIN I (HIGH SENSITIVITY)    EKG EKG Interpretation  Date/Time:  Thursday December 14 2020 14:31:02 EDT Ventricular Rate:  84 PR Interval:  161 QRS Duration: 79 QT Interval:  366 QTC Calculation: 433 R Axis:   45 Text Interpretation: Sinus arrhythmia Borderline repolarization abnormality sinus replacing aflutter on prior 12/21 Confirmed by Aletta Edouard [id number]) on 12/14/2020 2:40:52 PM   Radiology CT Abdomen Pelvis Wo Contrast  Result Date: 12/14/2020 CLINICAL DATA:  Abdominal distension, pain EXAM: CT ABDOMEN AND PELVIS WITHOUT CONTRAST TECHNIQUE: Multidetector CT imaging of the abdomen and pelvis was performed following the standard protocol without IV contrast. COMPARISON:  09/10/2020 FINDINGS: Lower chest: Spiculated right middle lobe pulmonary nodule image 10/6 measures 1.4 x 1.6 cm. Subpleural consolidation within the right middle lobe abutting the right hemidiaphragm measures 2.6 x 1.3 cm. Neoplasm is suspected. Extensive bibasilar scarring and fibrosis. Within the lateral segment right middle lobe there is bronchial wall thickening and patchy ground-glass airspace  disease which could be inflammatory or infectious. Hepatobiliary: No focal liver abnormality is seen. No gallstones, gallbladder wall thickening, or biliary dilatation. Pancreas: Unremarkable. No pancreatic ductal dilatation or surrounding inflammatory changes. Spleen: Normal in size without focal abnormality. Adrenals/Urinary Tract: No urinary tract calculi or obstructive uropathy. Nonspecific left adrenal thickening measuring up to 11 mm. The right adrenals unremarkable. Multiple small bladder diverticula are identified. Stomach/Bowel: No bowel obstruction or ileus. Normal retrocecal appendix. No bowel wall thickening or inflammatory change. Vascular/Lymphatic: Extensive atherosclerosis involving the aorta and distal branches. Mild aneurysmal dilatation of the infrarenal abdominal aorta measures 3.6 cm. Evaluation of the vascular lumen is limited without IV contrast. No pathologic adenopathy within the abdomen or pelvis. Reproductive: Prostate is unremarkable. Other: No free fluid or free gas.  No abdominal wall hernia. Musculoskeletal: No acute or destructive bony lesions. Reconstructed images demonstrate no additional findings. IMPRESSION: 1. Spiculated right middle lobe pulmonary nodule highly concerning for malignancy. 2. Subpleural consolidation within the more inferior aspect of the right middle lobe, as well as ground-glass airspace disease within the lateral segment right middle lobe, which may be inflammatory or infectious. 3. Nonspecific left adrenal thickening measuring 11 mm. 4. 3.6 cm infrarenal abdominal aortic aneurysm. Evaluation limited without intravenous contrast. Recommend follow-up ultrasound every 2 years. This recommendation follows ACR consensus guidelines: White Paper of the ACR Incidental Findings Committee II on Vascular Findings. J Am Coll Radiol 2013; 10:789-794. 5. Aortic Atherosclerosis (ICD10-I70.0) and Emphysema (ICD10-J43.9). Electronically Signed   By: Randa Ngo [name]   On:  12/14/2020 17:44   DG Chest Port 1 View  Result Date: 12/14/2020 CLINICAL DATA:  Dementia, abdominal pain, cough EXAM: PORTABLE CHEST 1 VIEW COMPARISON:  09/19/2020 FINDINGS: Single frontal view of the chest demonstrates stable postsurgical changes from prior median sternotomy and bypass surgery. The cardiac silhouette is unremarkable. Chronic background emphysema and scarring, without acute airspace disease, effusion, or pneumothorax. IMPRESSION: 1. Stable emphysema.  No acute process. Electronically Signed   By: Randa Ngo [name]   On: 12/14/2020 15:26    Procedures Procedures   Medications Ordered in ED Medications  sodium chloride 0.9 % bolus 500 mL (0 mLs Intravenous Stopped 12/14/20 1924)    ED Course  I have reviewed the triage vital signs and the nursing notes.  Pertinent labs & imaging results that were available during my care of the patient were reviewed by me and considered in my medical decision making (see chart for details).  Clinical Course as of 12/15/20 0955  Thu Dec 14, 2020  1758 I was able to reach the patient's son [name] and updated him on the results of the CT imaging.  He said he was, try to swing up here and see his father. [MB]    Clinical Course User Index [MB] Hayden Rasmussen, MD   MDM Rules/Calculators/A&P                         This patient complains of decreased p.o. intake, abdominal distention; this involves an extensive number of treatment Options and is a complaint that carries with it a high risk of complications and Morbidity. The differential includes viral syndrome, peptic ulcer disease, colitis, diverticulitis, obstruction, UTI, dehydration, metabolic derangement  I ordered, reviewed and interpreted labs, which included CBC with elevated white count, hemoglobin low but stable from priors, chemistries with low bicarb consistent with priors, elevated creatinine consistent with priors, normal LFTs, urinalysis without overt signs of infection,  troponin flat I ordered medication IV fluids I ordered imaging studies which included pelvis and I independently    visualized and interpreted imaging which showed pulmonary nodule possibly malignant, infrarenal AAA nonruptured, other incidental findings Additional history obtained from EMS and patient's son Previous records obtained and reviewed in epic, no recent admissions  After the interventions stated above, I reevaluated the patient and found patient to be asymptomatic.  He has had something to drink in the department and is in no distress.  Will return to facility to have them continue to observe.  Return instructions indicated on discharge paperwork.  Also reviewed with son over the phone.   Final Clinical Impression(s) / ED Diagnoses Final diagnoses:  Generalized abdominal pain  Lung nodule seen on imaging study  Abdominal aortic aneurysm (AAA) without rupture [facility])    Rx / DC Orders ED Discharge Orders    None       Hayden Rasmussen, MD 12/15/20 [phone]

## 2020-12-14 NOTE — ED Notes (Signed)
Placed external condom catheter on patient 

## 2020-12-14 NOTE — ED Notes (Signed)
Patient transported to CT 

## 2020-12-14 NOTE — Telephone Encounter (Signed)
Called and left vm for them to call and schedule cpe, mwv, and labs EM

## 2020-12-14 NOTE — ED Notes (Signed)
Pt given Sprite 

## 2020-12-14 NOTE — Telephone Encounter (Addendum)
Son called in and stated that he was [facility] and then moved to  [facility] after being in the hospital since December, that was the reason for the [place] paperwork.  Please advise.

## 2020-12-14 NOTE — ED Notes (Signed)
Pt returned from CT °

## 2020-12-14 NOTE — ED Triage Notes (Signed)
Coming from [facility], abdominal pain, new cough, history of dementia

## 2020-12-14 NOTE — Telephone Encounter (Signed)
Noted  

## 2020-12-14 NOTE — Discharge Instructions (Addendum)
You were seen in the emergency department for evaluation of abdominal bloating and pain.  You had a CAT scan that did not show any obvious abnormalities in your abdomen that would explain your symptoms.  There were incidental findings including a lung nodule and an abdominal aneurysm that will need to be further worked up.  Included below is the radiology report from your CAT scan.  Please schedule follow-up with your primary care doctor to discuss these findings.  IMPRESSION:  1. Spiculated right middle lobe pulmonary nodule highly concerning  for malignancy.  2. Subpleural consolidation within the more inferior aspect of the  right middle lobe, as well as ground-glass airspace disease within  the lateral segment right middle lobe, which may be inflammatory or  infectious.  3. Nonspecific left adrenal thickening measuring 11 mm.  4. 3.6 cm infrarenal abdominal aortic aneurysm. Evaluation limited  without intravenous contrast. Recommend follow-up ultrasound every 2  years. This recommendation follows ACR consensus guidelines: White  Paper of the ACR Incidental Findings Committee II on Vascular  Findings. J Am Coll Radiol 2013; 10:789-794.  5. Aortic Atherosclerosis (ICD10-I70.0) and Emphysema (ICD10-J43.9).

## 2020-12-14 NOTE — ED Notes (Signed)
Pt son arrived to visit and stated he is available to take pt back to [facility]

## 2020-12-15 NOTE — Telephone Encounter (Addendum)
Will need to call son later today to complete form.

## 2020-12-15 NOTE — Telephone Encounter (Addendum)
Spoke with son. Form completed and in Lisa's box.  I told son it was ready for him to pick up at his convenience.

## 2020-12-15 NOTE — Telephone Encounter (Signed)
Placed form at front office- yellow folders.  Also, made copy to scan and 1 for billing.

## 2020-12-22 DIAGNOSIS — I130 Hypertensive heart and chronic kidney disease with heart failure and stage 1 through stage 4 chronic kidney disease, or unspecified chronic kidney disease: Secondary | ICD-10-CM | POA: Diagnosis not present

## 2020-12-22 DIAGNOSIS — I5032 Chronic diastolic (congestive) heart failure: Secondary | ICD-10-CM | POA: Diagnosis not present

## 2020-12-22 DIAGNOSIS — J449 Chronic obstructive pulmonary disease, unspecified: Secondary | ICD-10-CM | POA: Diagnosis not present

## 2020-12-22 DIAGNOSIS — R1312 Dysphagia, oropharyngeal phase: Secondary | ICD-10-CM | POA: Diagnosis not present

## 2020-12-22 DIAGNOSIS — M150 Primary generalized (osteo)arthritis: Secondary | ICD-10-CM | POA: Diagnosis not present

## 2020-12-25 DIAGNOSIS — I130 Hypertensive heart and chronic kidney disease with heart failure and stage 1 through stage 4 chronic kidney disease, or unspecified chronic kidney disease: Secondary | ICD-10-CM | POA: Diagnosis not present

## 2020-12-25 DIAGNOSIS — R1312 Dysphagia, oropharyngeal phase: Secondary | ICD-10-CM | POA: Diagnosis not present

## 2020-12-25 DIAGNOSIS — M150 Primary generalized (osteo)arthritis: Secondary | ICD-10-CM | POA: Diagnosis not present

## 2020-12-25 DIAGNOSIS — I5032 Chronic diastolic (congestive) heart failure: Secondary | ICD-10-CM | POA: Diagnosis not present

## 2020-12-25 DIAGNOSIS — J449 Chronic obstructive pulmonary disease, unspecified: Secondary | ICD-10-CM | POA: Diagnosis not present

## 2020-12-26 DIAGNOSIS — R1312 Dysphagia, oropharyngeal phase: Secondary | ICD-10-CM | POA: Diagnosis not present

## 2020-12-26 DIAGNOSIS — I5032 Chronic diastolic (congestive) heart failure: Secondary | ICD-10-CM | POA: Diagnosis not present

## 2020-12-26 DIAGNOSIS — J449 Chronic obstructive pulmonary disease, unspecified: Secondary | ICD-10-CM | POA: Diagnosis not present

## 2020-12-26 DIAGNOSIS — I130 Hypertensive heart and chronic kidney disease with heart failure and stage 1 through stage 4 chronic kidney disease, or unspecified chronic kidney disease: Secondary | ICD-10-CM | POA: Diagnosis not present

## 2020-12-26 DIAGNOSIS — M150 Primary generalized (osteo)arthritis: Secondary | ICD-10-CM | POA: Diagnosis not present

## 2020-12-27 DIAGNOSIS — I5032 Chronic diastolic (congestive) heart failure: Secondary | ICD-10-CM | POA: Diagnosis not present

## 2020-12-27 DIAGNOSIS — J449 Chronic obstructive pulmonary disease, unspecified: Secondary | ICD-10-CM | POA: Diagnosis not present

## 2020-12-27 DIAGNOSIS — I130 Hypertensive heart and chronic kidney disease with heart failure and stage 1 through stage 4 chronic kidney disease, or unspecified chronic kidney disease: Secondary | ICD-10-CM | POA: Diagnosis not present

## 2020-12-27 DIAGNOSIS — R1312 Dysphagia, oropharyngeal phase: Secondary | ICD-10-CM | POA: Diagnosis not present

## 2020-12-27 DIAGNOSIS — M150 Primary generalized (osteo)arthritis: Secondary | ICD-10-CM | POA: Diagnosis not present

## 2020-12-29 DIAGNOSIS — R1312 Dysphagia, oropharyngeal phase: Secondary | ICD-10-CM | POA: Diagnosis not present

## 2020-12-29 DIAGNOSIS — I5032 Chronic diastolic (congestive) heart failure: Secondary | ICD-10-CM | POA: Diagnosis not present

## 2020-12-29 DIAGNOSIS — M150 Primary generalized (osteo)arthritis: Secondary | ICD-10-CM | POA: Diagnosis not present

## 2020-12-29 DIAGNOSIS — J449 Chronic obstructive pulmonary disease, unspecified: Secondary | ICD-10-CM | POA: Diagnosis not present

## 2020-12-29 DIAGNOSIS — I130 Hypertensive heart and chronic kidney disease with heart failure and stage 1 through stage 4 chronic kidney disease, or unspecified chronic kidney disease: Secondary | ICD-10-CM | POA: Diagnosis not present

## 2021-01-01 DIAGNOSIS — M150 Primary generalized (osteo)arthritis: Secondary | ICD-10-CM | POA: Diagnosis not present

## 2021-01-01 DIAGNOSIS — I130 Hypertensive heart and chronic kidney disease with heart failure and stage 1 through stage 4 chronic kidney disease, or unspecified chronic kidney disease: Secondary | ICD-10-CM | POA: Diagnosis not present

## 2021-01-01 DIAGNOSIS — J449 Chronic obstructive pulmonary disease, unspecified: Secondary | ICD-10-CM | POA: Diagnosis not present

## 2021-01-01 DIAGNOSIS — I5032 Chronic diastolic (congestive) heart failure: Secondary | ICD-10-CM | POA: Diagnosis not present

## 2021-01-01 DIAGNOSIS — R1312 Dysphagia, oropharyngeal phase: Secondary | ICD-10-CM | POA: Diagnosis not present

## 2021-01-02 DIAGNOSIS — M150 Primary generalized (osteo)arthritis: Secondary | ICD-10-CM | POA: Diagnosis not present

## 2021-01-02 DIAGNOSIS — I5032 Chronic diastolic (congestive) heart failure: Secondary | ICD-10-CM | POA: Diagnosis not present

## 2021-01-02 DIAGNOSIS — J449 Chronic obstructive pulmonary disease, unspecified: Secondary | ICD-10-CM | POA: Diagnosis not present

## 2021-01-02 DIAGNOSIS — I130 Hypertensive heart and chronic kidney disease with heart failure and stage 1 through stage 4 chronic kidney disease, or unspecified chronic kidney disease: Secondary | ICD-10-CM | POA: Diagnosis not present

## 2021-01-02 DIAGNOSIS — R1312 Dysphagia, oropharyngeal phase: Secondary | ICD-10-CM | POA: Diagnosis not present

## 2021-01-03 DIAGNOSIS — J449 Chronic obstructive pulmonary disease, unspecified: Secondary | ICD-10-CM | POA: Diagnosis not present

## 2021-01-03 DIAGNOSIS — I130 Hypertensive heart and chronic kidney disease with heart failure and stage 1 through stage 4 chronic kidney disease, or unspecified chronic kidney disease: Secondary | ICD-10-CM | POA: Diagnosis not present

## 2021-01-03 DIAGNOSIS — M150 Primary generalized (osteo)arthritis: Secondary | ICD-10-CM | POA: Diagnosis not present

## 2021-01-03 DIAGNOSIS — I5032 Chronic diastolic (congestive) heart failure: Secondary | ICD-10-CM | POA: Diagnosis not present

## 2021-01-03 DIAGNOSIS — R1312 Dysphagia, oropharyngeal phase: Secondary | ICD-10-CM | POA: Diagnosis not present

## 2021-01-07 DIAGNOSIS — U071 COVID-19: Secondary | ICD-10-CM | POA: Diagnosis not present

## 2021-01-07 DIAGNOSIS — J9601 Acute respiratory failure with hypoxia: Secondary | ICD-10-CM | POA: Diagnosis not present

## 2021-01-08 DIAGNOSIS — I130 Hypertensive heart and chronic kidney disease with heart failure and stage 1 through stage 4 chronic kidney disease, or unspecified chronic kidney disease: Secondary | ICD-10-CM | POA: Diagnosis not present

## 2021-01-08 DIAGNOSIS — I5032 Chronic diastolic (congestive) heart failure: Secondary | ICD-10-CM | POA: Diagnosis not present

## 2021-01-08 DIAGNOSIS — R1312 Dysphagia, oropharyngeal phase: Secondary | ICD-10-CM | POA: Diagnosis not present

## 2021-01-08 DIAGNOSIS — J449 Chronic obstructive pulmonary disease, unspecified: Secondary | ICD-10-CM | POA: Diagnosis not present

## 2021-01-08 DIAGNOSIS — M150 Primary generalized (osteo)arthritis: Secondary | ICD-10-CM | POA: Diagnosis not present

## 2021-01-11 DIAGNOSIS — I130 Hypertensive heart and chronic kidney disease with heart failure and stage 1 through stage 4 chronic kidney disease, or unspecified chronic kidney disease: Secondary | ICD-10-CM | POA: Diagnosis not present

## 2021-01-11 DIAGNOSIS — M150 Primary generalized (osteo)arthritis: Secondary | ICD-10-CM | POA: Diagnosis not present

## 2021-01-11 DIAGNOSIS — D696 Thrombocytopenia, unspecified: Secondary | ICD-10-CM | POA: Diagnosis not present

## 2021-01-11 DIAGNOSIS — I714 Abdominal aortic aneurysm, without rupture: Secondary | ICD-10-CM | POA: Diagnosis not present

## 2021-01-11 DIAGNOSIS — J449 Chronic obstructive pulmonary disease, unspecified: Secondary | ICD-10-CM | POA: Diagnosis not present

## 2021-01-11 DIAGNOSIS — R1312 Dysphagia, oropharyngeal phase: Secondary | ICD-10-CM | POA: Diagnosis not present

## 2021-01-11 DIAGNOSIS — I5032 Chronic diastolic (congestive) heart failure: Secondary | ICD-10-CM | POA: Diagnosis not present

## 2021-01-15 DIAGNOSIS — E785 Hyperlipidemia, unspecified: Secondary | ICD-10-CM | POA: Diagnosis not present

## 2021-01-15 DIAGNOSIS — R1312 Dysphagia, oropharyngeal phase: Secondary | ICD-10-CM | POA: Diagnosis not present

## 2021-01-15 DIAGNOSIS — J449 Chronic obstructive pulmonary disease, unspecified: Secondary | ICD-10-CM | POA: Diagnosis not present

## 2021-01-15 DIAGNOSIS — I5032 Chronic diastolic (congestive) heart failure: Secondary | ICD-10-CM | POA: Diagnosis not present

## 2021-01-15 DIAGNOSIS — S81811A Laceration without foreign body, right lower leg, initial encounter: Secondary | ICD-10-CM | POA: Diagnosis not present

## 2021-01-15 DIAGNOSIS — I130 Hypertensive heart and chronic kidney disease with heart failure and stage 1 through stage 4 chronic kidney disease, or unspecified chronic kidney disease: Secondary | ICD-10-CM | POA: Diagnosis not present

## 2021-01-15 DIAGNOSIS — G4700 Insomnia, unspecified: Secondary | ICD-10-CM | POA: Diagnosis not present

## 2021-01-15 DIAGNOSIS — W19XXXA Unspecified fall, initial encounter: Secondary | ICD-10-CM | POA: Diagnosis not present

## 2021-01-15 DIAGNOSIS — M150 Primary generalized (osteo)arthritis: Secondary | ICD-10-CM | POA: Diagnosis not present

## 2021-01-16 ENCOUNTER — Emergency Department (HOSPITAL_COMMUNITY)
Admission: EM | Admit: 2021-01-16 | Discharge: 2021-01-16 | Disposition: A | Discharge status: Home / Self Care | Payer: Medicare Other | Attending: Emergency Medicine | Admitting: Emergency Medicine

## 2021-01-16 ENCOUNTER — Emergency Department (HOSPITAL_COMMUNITY): Payer: Medicare Other

## 2021-01-16 DIAGNOSIS — J929 Pleural plaque without asbestos: Secondary | ICD-10-CM | POA: Diagnosis not present

## 2021-01-16 DIAGNOSIS — R6889 Other general symptoms and signs: Secondary | ICD-10-CM | POA: Diagnosis not present

## 2021-01-16 DIAGNOSIS — R279 Unspecified lack of coordination: Secondary | ICD-10-CM | POA: Diagnosis not present

## 2021-01-16 DIAGNOSIS — M160 Bilateral primary osteoarthritis of hip: Secondary | ICD-10-CM | POA: Diagnosis not present

## 2021-01-16 DIAGNOSIS — R9431 Abnormal electrocardiogram [ECG] [EKG]: Secondary | ICD-10-CM | POA: Diagnosis not present

## 2021-01-16 DIAGNOSIS — I6782 Cerebral ischemia: Secondary | ICD-10-CM | POA: Diagnosis not present

## 2021-01-16 DIAGNOSIS — F0390 Unspecified dementia without behavioral disturbance: Secondary | ICD-10-CM | POA: Diagnosis not present

## 2021-01-16 DIAGNOSIS — S0081XA Abrasion of other part of head, initial encounter: Secondary | ICD-10-CM | POA: Diagnosis not present

## 2021-01-16 DIAGNOSIS — M150 Primary generalized (osteo)arthritis: Secondary | ICD-10-CM | POA: Diagnosis not present

## 2021-01-16 DIAGNOSIS — R1312 Dysphagia, oropharyngeal phase: Secondary | ICD-10-CM | POA: Diagnosis not present

## 2021-01-16 DIAGNOSIS — G9389 Other specified disorders of brain: Secondary | ICD-10-CM | POA: Diagnosis not present

## 2021-01-16 DIAGNOSIS — G319 Degenerative disease of nervous system, unspecified: Secondary | ICD-10-CM | POA: Diagnosis not present

## 2021-01-16 DIAGNOSIS — Z23 Encounter for immunization: Secondary | ICD-10-CM | POA: Insufficient documentation

## 2021-01-16 DIAGNOSIS — R0902 Hypoxemia: Secondary | ICD-10-CM | POA: Diagnosis not present

## 2021-01-16 DIAGNOSIS — S51011A Laceration without foreign body of right elbow, initial encounter: Secondary | ICD-10-CM | POA: Insufficient documentation

## 2021-01-16 DIAGNOSIS — I4891 Unspecified atrial fibrillation: Secondary | ICD-10-CM | POA: Diagnosis not present

## 2021-01-16 DIAGNOSIS — S0990XA Unspecified injury of head, initial encounter: Secondary | ICD-10-CM | POA: Diagnosis not present

## 2021-01-16 DIAGNOSIS — J392 Other diseases of pharynx: Secondary | ICD-10-CM | POA: Diagnosis not present

## 2021-01-16 DIAGNOSIS — J449 Chronic obstructive pulmonary disease, unspecified: Secondary | ICD-10-CM | POA: Diagnosis not present

## 2021-01-16 DIAGNOSIS — Z743 Need for continuous supervision: Secondary | ICD-10-CM | POA: Diagnosis not present

## 2021-01-16 DIAGNOSIS — I5032 Chronic diastolic (congestive) heart failure: Secondary | ICD-10-CM | POA: Diagnosis not present

## 2021-01-16 DIAGNOSIS — W06XXXA Fall from bed, initial encounter: Secondary | ICD-10-CM | POA: Insufficient documentation

## 2021-01-16 DIAGNOSIS — Z7901 Long term (current) use of anticoagulants: Secondary | ICD-10-CM | POA: Insufficient documentation

## 2021-01-16 DIAGNOSIS — I130 Hypertensive heart and chronic kidney disease with heart failure and stage 1 through stage 4 chronic kidney disease, or unspecified chronic kidney disease: Secondary | ICD-10-CM | POA: Diagnosis not present

## 2021-01-16 DIAGNOSIS — M47816 Spondylosis without myelopathy or radiculopathy, lumbar region: Secondary | ICD-10-CM | POA: Diagnosis not present

## 2021-01-16 DIAGNOSIS — S0001XA Abrasion of scalp, initial encounter: Secondary | ICD-10-CM | POA: Diagnosis not present

## 2021-01-16 DIAGNOSIS — J439 Emphysema, unspecified: Secondary | ICD-10-CM | POA: Diagnosis not present

## 2021-01-16 DIAGNOSIS — M5030 Other cervical disc degeneration, unspecified cervical region: Secondary | ICD-10-CM | POA: Diagnosis not present

## 2021-01-16 DIAGNOSIS — W19XXXA Unspecified fall, initial encounter: Secondary | ICD-10-CM | POA: Diagnosis not present

## 2021-01-16 DIAGNOSIS — Z043 Encounter for examination and observation following other accident: Secondary | ICD-10-CM | POA: Diagnosis not present

## 2021-01-16 DIAGNOSIS — R531 Weakness: Secondary | ICD-10-CM | POA: Diagnosis not present

## 2021-01-16 DIAGNOSIS — R404 Transient alteration of awareness: Secondary | ICD-10-CM | POA: Diagnosis not present

## 2021-01-16 DIAGNOSIS — S59901A Unspecified injury of right elbow, initial encounter: Secondary | ICD-10-CM | POA: Diagnosis present

## 2021-01-16 LAB — CBC WITH DIFFERENTIAL/PLATELET
Abs Immature Granulocytes: 0.04 10*3/uL (ref 0.00–0.07)
Basophils Absolute: 0 10*3/uL (ref 0.0–0.1)
Basophils Relative: 0 %
Eosinophils Absolute: 0.2 10*3/uL (ref 0.0–0.5)
Eosinophils Relative: 3 %
HCT: 33.5 % — ABNORMAL LOW (ref 39.0–52.0)
Hemoglobin: 10.8 g/dL — ABNORMAL LOW (ref 13.0–17.0)
Immature Granulocytes: 1 %
Lymphocytes Relative: 36 %
Lymphs Abs: 2.6 10*3/uL (ref 0.7–4.0)
MCH: 29.8 pg (ref 26.0–34.0)
MCHC: 32.2 g/dL (ref 30.0–36.0)
MCV: 92.3 fL (ref 80.0–100.0)
Monocytes Absolute: 0.8 10*3/uL (ref 0.1–1.0)
Monocytes Relative: 11 %
Neutro Abs: 3.6 10*3/uL (ref 1.7–7.7)
Neutrophils Relative %: 49 %
Platelets: 124 10*3/uL — ABNORMAL LOW (ref 150–400)
RBC: 3.63 MIL/uL — ABNORMAL LOW (ref 4.22–5.81)
RDW: 14.3 % (ref 11.5–15.5)
WBC: 7.2 10*3/uL (ref 4.0–10.5)
nRBC: 0 % (ref 0.0–0.2)

## 2021-01-16 LAB — BASIC METABOLIC PANEL
Anion gap: 9 (ref 5–15)
BUN: 33 mg/dL — ABNORMAL HIGH (ref 8–23)
CO2: 23 mmol/L (ref 22–32)
Calcium: 9 mg/dL (ref 8.9–10.3)
Chloride: 105 mmol/L (ref 98–111)
Creatinine, Ser: 1.63 mg/dL — ABNORMAL HIGH (ref 0.61–1.24)
GFR, Estimated: 41 mL/min — ABNORMAL LOW (ref >60)
Glucose, Bld: 95 mg/dL (ref 70–99)
Potassium: 4 mmol/L (ref 3.5–5.1)
Sodium: 137 mmol/L (ref 135–145)

## 2021-01-16 MED ORDER — METOPROLOL TARTRATE 5 MG/5ML IV SOLN: 5.0000 mg | Freq: Once | INTRAVENOUS | Status: DC

## 2021-01-16 MED ORDER — TETANUS-DIPHTH-ACELL PERTUSSIS 5-2.5-18.5 LF-MCG/0.5 IM SUSY
0.5000 mL | PREFILLED_SYRINGE | Freq: Once | INTRAMUSCULAR | Status: AC
  Administered 2021-01-16: 0.5 mL via INTRAMUSCULAR
  Filled 2021-01-16 (×2): qty 0.5

## 2021-01-16 NOTE — Discharge Instructions (Addendum)
X-rays and CT scans are negative for serious traumatic injury.  Follow-up with your doctor.  Return to the ED with new or worsening symptoms.

## 2021-01-16 NOTE — ED Notes (Signed)
Pt HR shot up to 130s-140s - Dr. Josefa Half at bedside - put in orders for lopressor. Pt HR came back down to 60s-70s - Dr. Josefa Half notified - holding Lopressor for right now

## 2021-01-16 NOTE — ED Provider Notes (Signed)
[facility] EMERGENCY DEPARTMENT Provider Note   CSN: [id number] Arrival date & time: 01/16/21  0256     History Chief Complaint  Patient presents with  . Fall on [place] is a 87 y.o. male.  Level 5 caveat for dementia.  Patient brought in by EMS after unwitnessed fall from his living facility.  Patient does not know what happened.  He was found to have an abrasion to his scalp and a skin tear to his right elbow.  Does not remember falling.  He is oriented to person and place.  Denies any pain.  He does take Eliquis for history of atrial fibrillation.  Denies any head, neck, back, chest or abdominal pain.  Denies any dizziness or lightheadedness.  He apparently is ambulatory without any assistance  The history is provided by the patient and the EMS personnel. The history is limited by the condition of the patient.       No past medical history on file.  There are no problems to display for this patient.    The histories are not reviewed yet. Please review them in the "History" navigator section and refresh this [place].     No family history on file.     Home Medications Prior to Admission medications   Not on File    Allergies    Patient has no allergy information on record.  Review of Systems   Review of Systems  Constitutional: Negative for activity change, appetite change and fever.  HENT: Negative for congestion.   Respiratory: Negative for cough, chest tightness and shortness of breath.   Cardiovascular: Negative for chest pain.  Gastrointestinal: Negative for abdominal pain, nausea and vomiting.  Genitourinary: Negative for dysuria and hematuria.  Musculoskeletal: Negative for arthralgias and myalgias.  Skin: Positive for wound.  Neurological: Negative for headaches.   all other systems are negative except as noted in the HPI and PMH.   Physical Exam Updated Vital Signs SpO2 95%   Physical Exam Vitals and  nursing note reviewed.  Constitutional:      General: He is not in acute distress.    Appearance: He is well-developed.  HENT:     Head: Normocephalic.     Comments: Abrasion to forehead and anterior scalp    Mouth/Throat:     Pharynx: No oropharyngeal exudate.  Eyes:     Conjunctiva/sclera: Conjunctivae normal.     Pupils: Pupils are equal, round, and reactive to light.  Neck:     Comments: C-collar in place, no C-spine tenderness Cardiovascular:     Rate and Rhythm: Normal rate and regular rhythm.     Heart sounds: Normal heart sounds. No murmur heard.   Pulmonary:     Effort: Pulmonary effort is normal. No respiratory distress.     Breath sounds: Normal breath sounds.  Abdominal:     Palpations: Abdomen is soft.     Tenderness: There is no abdominal tenderness. There is no guarding or rebound.  Musculoskeletal:        General: No tenderness. Normal range of motion.     Cervical back: Normal range of motion and neck supple.     Comments: Pelvis stable.  Full range of motion hips without pain. Skin tear right elbow without bony tenderness  Skin:    General: Skin is warm.  Neurological:     Mental Status: He is alert.     Cranial Nerves: No cranial nerve deficit.  Motor: No abnormal muscle tone.     Coordination: Coordination normal.     Comments: No facial droop, cranial nerves II to XII intact, 5/5 strength throughout.  Oriented to person and place.  Psychiatric:        Behavior: Behavior normal.     ED Results / Procedures / Treatments   Labs (all labs ordered are listed, but only abnormal results are displayed) Labs Reviewed  CBC WITH DIFFERENTIAL/PLATELET - Abnormal; Notable for the following components:      Result Value   RBC 3.63 (*)    Hemoglobin 10.8 (*)    HCT 33.5 (*)    Platelets 124 (*)    All other components within normal limits  BASIC METABOLIC PANEL - Abnormal; Notable for the following components:   BUN 33 (*)    Creatinine, Ser 1.63 (*)     GFR, Estimated 41 (*)    All other components within normal limits    EKG EKG Interpretation  Date/Time:  Tuesday Jan 16 2021 03:39:26 EDT Ventricular Rate:  59 PR Interval:  175 QRS Duration: 109 QT Interval:  411 QTC Calculation: 408 R Axis:   14 Text Interpretation: Sinus rhythm Multiform ventricular premature complexes Nonspecific repol abnormality, diffuse leads No previous ECGs available Confirmed by Ezequiel Essex [phone]) on 01/16/2021 3:56:57 AM   Radiology DG Elbow Complete Right  Result Date: 01/16/2021 CLINICAL DATA:  Fall with skin tear EXAM: RIGHT ELBOW - COMPLETE 3+ VIEW COMPARISON:  None. FINDINGS: There is no evidence of fracture, dislocation, or joint effusion. Generalized osteopenia with degenerative elbow spurring and joint narrowing. No opaque foreign body. IMPRESSION: No acute finding. Electronically Signed   By: Monte Fantasia [name]   On: 01/16/2021 04:06   CT Head Wo Contrast  Result Date: 01/16/2021 CLINICAL DATA:  Level 2 fall on blood thinners EXAM: CT HEAD WITHOUT CONTRAST CT CERVICAL SPINE WITHOUT CONTRAST TECHNIQUE: Multidetector CT imaging of the head and cervical spine was performed following the standard protocol without intravenous contrast. Multiplanar CT image reconstructions of the cervical spine were also generated. COMPARISON:  09/10/2020 FINDINGS: CT HEAD FINDINGS Brain: No evidence of acute infarction, hemorrhage, hydrocephalus, extra-axial collection or mass lesion/mass effect. Confluent chronic small vessel ischemic gliosis in the cerebral white matter. Brain atrophy most notably affecting the inferior temporal lobes. Motion artifact at the vertex. Vascular: Atheromatous calcification Skull: Negative for fracture Sinuses/Orbits: Negative Other: Smoothly contoured right eccentric nasopharyngeal mass measuring 2 cm in length, most likely a retention cyst given appearance and stability from prior. No associated mastoid effusion. CT CERVICAL SPINE  FINDINGS Alignment: No traumatic malalignment Skull base and vertebrae: No acute fracture Soft tissues and spinal canal: No prevertebral fluid or swelling. No visible canal hematoma. Disc levels:  Ordinary cervical spine degeneration for age. Upper chest: Partially calcified pleural plaque at the apices. IMPRESSION: 1. No evidence of acute intracranial or cervical spine injury. 2. Chronic/senescent changes as described. 3. Motion artifact on the head CT at the vertex. Electronically Signed   By: Monte Fantasia [name]   On: 01/16/2021 04:20   CT Cervical Spine Wo Contrast  Result Date: 01/16/2021 CLINICAL DATA:  Level 2 fall on blood thinners EXAM: CT HEAD WITHOUT CONTRAST CT CERVICAL SPINE WITHOUT CONTRAST TECHNIQUE: Multidetector CT imaging of the head and cervical spine was performed following the standard protocol without intravenous contrast. Multiplanar CT image reconstructions of the cervical spine were also generated. COMPARISON:  09/10/2020 FINDINGS: CT HEAD FINDINGS Brain: No evidence of acute infarction, hemorrhage,  hydrocephalus, extra-axial collection or mass lesion/mass effect. Confluent chronic small vessel ischemic gliosis in the cerebral white matter. Brain atrophy most notably affecting the inferior temporal lobes. Motion artifact at the vertex. Vascular: Atheromatous calcification Skull: Negative for fracture Sinuses/Orbits: Negative Other: Smoothly contoured right eccentric nasopharyngeal mass measuring 2 cm in length, most likely a retention cyst given appearance and stability from prior. No associated mastoid effusion. CT CERVICAL SPINE FINDINGS Alignment: No traumatic malalignment Skull base and vertebrae: No acute fracture Soft tissues and spinal canal: No prevertebral fluid or swelling. No visible canal hematoma. Disc levels:  Ordinary cervical spine degeneration for age. Upper chest: Partially calcified pleural plaque at the apices. IMPRESSION: 1. No evidence of acute intracranial or  cervical spine injury. 2. Chronic/senescent changes as described. 3. Motion artifact on the head CT at the vertex. Electronically Signed   By: Monte Fantasia [name]   On: 01/16/2021 04:20   DG Pelvis Portable  Result Date: 01/16/2021 CLINICAL DATA:  Unwitnessed fall EXAM: PORTABLE PELVIS 1-2 VIEWS COMPARISON:  CT abdomen pelvis 12/14/2020 FINDINGS: The osseous structures appear diffusely demineralized which may limit detection of small or nondisplaced fractures. Bones of the pelvis appear intact and congruent. Femoral heads appear intact and normally located. Degenerative changes in the lower lumbar spine, SI joints, and bilateral hips. No significant swelling or large hematoma. Moderate colonic stool burden. Remaining soft tissues are free of acute abnormality. Telemetry leads overlie the abdomen. IMPRESSION: No acute osseous abnormality. Degenerative changes as above. Electronically Signed   By: Lovena Le [name]   On: 01/16/2021 03:53   DG Chest Portable 1 View  Result Date: 01/16/2021 CLINICAL DATA:  Fall EXAM: PORTABLE CHEST 1 VIEW COMPARISON:  12/14/2020 FINDINGS: Postsurgical changes from prior median sternotomy and cardiac bypass. Cardiomediastinal contours are unchanged from prior. Some chronically interstitial features, emphysema and scarring are similar to comparison. No consolidation, features of edema, pneumothorax, or effusion. No acute osseous or soft tissue abnormality. IMPRESSION: No acute cardiopulmonary or traumatic findings of the chest. Chronically coarsened interstitial changes and emphysematous features similar to prior. Prior sternotomy and CABG. Electronically Signed   By: Lovena Le [name]   On: 01/16/2021 03:55    Procedures Procedures   Medications Ordered in ED Medications  Tdap (BOOSTRIX) injection 0.5 mL (has no administration in time range)    ED Course  I have reviewed the triage vital signs and the nursing notes.  Pertinent labs & imaging results that were  available during my care of the patient were reviewed by me and considered in my medical decision making (see chart for details).    MDM Rules/Calculators/A&P                         Unwitnessed fall with head injury and right elbow skin tear.  Oriented x2.  Does take Eliquis  Hemoglobin and creatinine are stable. Patient does have second medical record number which was utilized.  Did develop rapid narrow complex tachycardia in the 130s.  IV Lopressor was given  CT head and C-spine are negative.  Heart rate did convert on its own back to rhythm in the 70s and 80s.  Imaging negative for acute traumatic injury.  Heart rate has normalized.  Patient tolerating p.o. and ambulatory.  Appears stable to return to his facility. Final Clinical Impression(s) / ED Diagnoses Final diagnoses:  Fall, initial encounter  Injury of head, initial encounter    Rx / DC Orders ED Discharge Orders  None       Ezequiel Essex, MD 01/16/21 [phone]

## 2021-01-16 NOTE — ED Notes (Signed)
Pt to CT

## 2021-01-16 NOTE — ED Triage Notes (Signed)
Pt BIB EMS from [facility] memory unit. Pt had a unwitnessed fall from bed. Pt has an abrasion on R head and on R elbow. Pt is on Eliquis.  Pt has hx of dementia, does not remember falling. Pt complaining of no pain. VS  BP  172/98 HR 72 O2 95% on RA  CBG 137

## 2021-01-16 NOTE — ED Notes (Signed)
Per Dr. Wyvonnia Dusky can take pt c collar off

## 2021-01-17 DIAGNOSIS — I130 Hypertensive heart and chronic kidney disease with heart failure and stage 1 through stage 4 chronic kidney disease, or unspecified chronic kidney disease: Secondary | ICD-10-CM | POA: Diagnosis not present

## 2021-01-17 DIAGNOSIS — J449 Chronic obstructive pulmonary disease, unspecified: Secondary | ICD-10-CM | POA: Diagnosis not present

## 2021-01-17 DIAGNOSIS — R1312 Dysphagia, oropharyngeal phase: Secondary | ICD-10-CM | POA: Diagnosis not present

## 2021-01-17 DIAGNOSIS — I5032 Chronic diastolic (congestive) heart failure: Secondary | ICD-10-CM | POA: Diagnosis not present

## 2021-01-17 DIAGNOSIS — M150 Primary generalized (osteo)arthritis: Secondary | ICD-10-CM | POA: Diagnosis not present

## 2021-01-18 DIAGNOSIS — I130 Hypertensive heart and chronic kidney disease with heart failure and stage 1 through stage 4 chronic kidney disease, or unspecified chronic kidney disease: Secondary | ICD-10-CM | POA: Diagnosis not present

## 2021-01-18 DIAGNOSIS — M150 Primary generalized (osteo)arthritis: Secondary | ICD-10-CM | POA: Diagnosis not present

## 2021-01-18 DIAGNOSIS — R1312 Dysphagia, oropharyngeal phase: Secondary | ICD-10-CM | POA: Diagnosis not present

## 2021-01-18 DIAGNOSIS — I5032 Chronic diastolic (congestive) heart failure: Secondary | ICD-10-CM | POA: Diagnosis not present

## 2021-01-18 DIAGNOSIS — J449 Chronic obstructive pulmonary disease, unspecified: Secondary | ICD-10-CM | POA: Diagnosis not present

## 2021-01-19 ENCOUNTER — Emergency Department (HOSPITAL_COMMUNITY): Payer: Medicare Other

## 2021-01-19 ENCOUNTER — Emergency Department (HOSPITAL_COMMUNITY)
Admission: EM | Admit: 2021-01-19 | Discharge: 2021-01-19 | Disposition: A | Discharge status: Home / Self Care | Payer: Medicare Other | Source: Home / Self Care | Attending: Emergency Medicine | Admitting: Emergency Medicine

## 2021-01-19 ENCOUNTER — Other Ambulatory Visit: Payer: Self-pay

## 2021-01-19 ENCOUNTER — Encounter (HOSPITAL_COMMUNITY): Payer: Self-pay | Admitting: Emergency Medicine

## 2021-01-19 DIAGNOSIS — R6889 Other general symptoms and signs: Secondary | ICD-10-CM | POA: Diagnosis not present

## 2021-01-19 DIAGNOSIS — N183 Chronic kidney disease, stage 3 unspecified: Secondary | ICD-10-CM | POA: Insufficient documentation

## 2021-01-19 DIAGNOSIS — W1830XA Fall on same level, unspecified, initial encounter: Secondary | ICD-10-CM | POA: Diagnosis present

## 2021-01-19 DIAGNOSIS — N1832 Chronic kidney disease, stage 3b: Secondary | ICD-10-CM | POA: Diagnosis not present

## 2021-01-19 DIAGNOSIS — Z79899 Other long term (current) drug therapy: Secondary | ICD-10-CM | POA: Insufficient documentation

## 2021-01-19 DIAGNOSIS — Y92129 Unspecified place in nursing home as the place of occurrence of the external cause: Secondary | ICD-10-CM | POA: Insufficient documentation

## 2021-01-19 DIAGNOSIS — E1169 Type 2 diabetes mellitus with other specified complication: Secondary | ICD-10-CM | POA: Insufficient documentation

## 2021-01-19 DIAGNOSIS — Z28310 Unvaccinated for covid-19: Secondary | ICD-10-CM | POA: Diagnosis not present

## 2021-01-19 DIAGNOSIS — S01511A Laceration without foreign body of lip, initial encounter: Secondary | ICD-10-CM | POA: Diagnosis not present

## 2021-01-19 DIAGNOSIS — Z9981 Dependence on supplemental oxygen: Secondary | ICD-10-CM | POA: Diagnosis not present

## 2021-01-19 DIAGNOSIS — I6782 Cerebral ischemia: Secondary | ICD-10-CM | POA: Diagnosis not present

## 2021-01-19 DIAGNOSIS — M2550 Pain in unspecified joint: Secondary | ICD-10-CM | POA: Diagnosis not present

## 2021-01-19 DIAGNOSIS — I2510 Atherosclerotic heart disease of native coronary artery without angina pectoris: Secondary | ICD-10-CM | POA: Diagnosis not present

## 2021-01-19 DIAGNOSIS — E1122 Type 2 diabetes mellitus with diabetic chronic kidney disease: Secondary | ICD-10-CM | POA: Diagnosis not present

## 2021-01-19 DIAGNOSIS — Z951 Presence of aortocoronary bypass graft: Secondary | ICD-10-CM | POA: Insufficient documentation

## 2021-01-19 DIAGNOSIS — F0390 Unspecified dementia without behavioral disturbance: Secondary | ICD-10-CM | POA: Insufficient documentation

## 2021-01-19 DIAGNOSIS — F17210 Nicotine dependence, cigarettes, uncomplicated: Secondary | ICD-10-CM | POA: Insufficient documentation

## 2021-01-19 DIAGNOSIS — E1136 Type 2 diabetes mellitus with diabetic cataract: Secondary | ICD-10-CM | POA: Insufficient documentation

## 2021-01-19 DIAGNOSIS — R296 Repeated falls: Secondary | ICD-10-CM | POA: Diagnosis not present

## 2021-01-19 DIAGNOSIS — M150 Primary generalized (osteo)arthritis: Secondary | ICD-10-CM | POA: Diagnosis not present

## 2021-01-19 DIAGNOSIS — M545 Low back pain, unspecified: Secondary | ICD-10-CM | POA: Diagnosis not present

## 2021-01-19 DIAGNOSIS — Z515 Encounter for palliative care: Secondary | ICD-10-CM | POA: Diagnosis not present

## 2021-01-19 DIAGNOSIS — Y92098 Other place in other non-institutional residence as the place of occurrence of the external cause: Secondary | ICD-10-CM | POA: Diagnosis not present

## 2021-01-19 DIAGNOSIS — Z7951 Long term (current) use of inhaled steroids: Secondary | ICD-10-CM | POA: Insufficient documentation

## 2021-01-19 DIAGNOSIS — S065X9A Traumatic subdural hemorrhage with loss of consciousness of unspecified duration, initial encounter: Secondary | ICD-10-CM | POA: Diagnosis not present

## 2021-01-19 DIAGNOSIS — M47812 Spondylosis without myelopathy or radiculopathy, cervical region: Secondary | ICD-10-CM | POA: Diagnosis not present

## 2021-01-19 DIAGNOSIS — Z7984 Long term (current) use of oral hypoglycemic drugs: Secondary | ICD-10-CM | POA: Insufficient documentation

## 2021-01-19 DIAGNOSIS — Y9301 Activity, walking, marching and hiking: Secondary | ICD-10-CM | POA: Insufficient documentation

## 2021-01-19 DIAGNOSIS — W01198A Fall on same level from slipping, tripping and stumbling with subsequent striking against other object, initial encounter: Secondary | ICD-10-CM | POA: Insufficient documentation

## 2021-01-19 DIAGNOSIS — R1312 Dysphagia, oropharyngeal phase: Secondary | ICD-10-CM | POA: Diagnosis not present

## 2021-01-19 DIAGNOSIS — Z8616 Personal history of COVID-19: Secondary | ICD-10-CM | POA: Diagnosis not present

## 2021-01-19 DIAGNOSIS — I6200 Nontraumatic subdural hemorrhage, unspecified: Secondary | ICD-10-CM | POA: Diagnosis present

## 2021-01-19 DIAGNOSIS — R0600 Dyspnea, unspecified: Secondary | ICD-10-CM | POA: Diagnosis not present

## 2021-01-19 DIAGNOSIS — K219 Gastro-esophageal reflux disease without esophagitis: Secondary | ICD-10-CM | POA: Diagnosis not present

## 2021-01-19 DIAGNOSIS — I130 Hypertensive heart and chronic kidney disease with heart failure and stage 1 through stage 4 chronic kidney disease, or unspecified chronic kidney disease: Secondary | ICD-10-CM | POA: Insufficient documentation

## 2021-01-19 DIAGNOSIS — Z7901 Long term (current) use of anticoagulants: Secondary | ICD-10-CM | POA: Insufficient documentation

## 2021-01-19 DIAGNOSIS — D689 Coagulation defect, unspecified: Secondary | ICD-10-CM | POA: Diagnosis not present

## 2021-01-19 DIAGNOSIS — F0391 Unspecified dementia with behavioral disturbance: Secondary | ICD-10-CM | POA: Diagnosis present

## 2021-01-19 DIAGNOSIS — S065X0A Traumatic subdural hemorrhage without loss of consciousness, initial encounter: Secondary | ICD-10-CM | POA: Diagnosis not present

## 2021-01-19 DIAGNOSIS — I6381 Other cerebral infarction due to occlusion or stenosis of small artery: Secondary | ICD-10-CM | POA: Diagnosis not present

## 2021-01-19 DIAGNOSIS — S0990XA Unspecified injury of head, initial encounter: Secondary | ICD-10-CM | POA: Insufficient documentation

## 2021-01-19 DIAGNOSIS — G9389 Other specified disorders of brain: Secondary | ICD-10-CM | POA: Diagnosis not present

## 2021-01-19 DIAGNOSIS — R52 Pain, unspecified: Secondary | ICD-10-CM | POA: Diagnosis not present

## 2021-01-19 DIAGNOSIS — F0150 Vascular dementia without behavioral disturbance: Secondary | ICD-10-CM | POA: Diagnosis not present

## 2021-01-19 DIAGNOSIS — Z66 Do not resuscitate: Secondary | ICD-10-CM | POA: Diagnosis not present

## 2021-01-19 DIAGNOSIS — I5032 Chronic diastolic (congestive) heart failure: Secondary | ICD-10-CM | POA: Insufficient documentation

## 2021-01-19 DIAGNOSIS — S199XXA Unspecified injury of neck, initial encounter: Secondary | ICD-10-CM | POA: Diagnosis not present

## 2021-01-19 DIAGNOSIS — E538 Deficiency of other specified B group vitamins: Secondary | ICD-10-CM | POA: Diagnosis not present

## 2021-01-19 DIAGNOSIS — I25810 Atherosclerosis of coronary artery bypass graft(s) without angina pectoris: Secondary | ICD-10-CM | POA: Insufficient documentation

## 2021-01-19 DIAGNOSIS — W19XXXA Unspecified fall, initial encounter: Secondary | ICD-10-CM | POA: Diagnosis not present

## 2021-01-19 DIAGNOSIS — Z789 Other specified health status: Secondary | ICD-10-CM | POA: Diagnosis not present

## 2021-01-19 DIAGNOSIS — J449 Chronic obstructive pulmonary disease, unspecified: Secondary | ICD-10-CM | POA: Insufficient documentation

## 2021-01-19 DIAGNOSIS — I10 Essential (primary) hypertension: Secondary | ICD-10-CM | POA: Diagnosis not present

## 2021-01-19 DIAGNOSIS — E785 Hyperlipidemia, unspecified: Secondary | ICD-10-CM | POA: Insufficient documentation

## 2021-01-19 DIAGNOSIS — Z743 Need for continuous supervision: Secondary | ICD-10-CM | POA: Diagnosis not present

## 2021-01-19 DIAGNOSIS — M549 Dorsalgia, unspecified: Secondary | ICD-10-CM | POA: Diagnosis not present

## 2021-01-19 DIAGNOSIS — Z7189 Other specified counseling: Secondary | ICD-10-CM | POA: Diagnosis not present

## 2021-01-19 DIAGNOSIS — G319 Degenerative disease of nervous system, unspecified: Secondary | ICD-10-CM | POA: Diagnosis not present

## 2021-01-19 DIAGNOSIS — R404 Transient alteration of awareness: Secondary | ICD-10-CM | POA: Diagnosis not present

## 2021-01-19 DIAGNOSIS — J439 Emphysema, unspecified: Secondary | ICD-10-CM | POA: Diagnosis not present

## 2021-01-19 DIAGNOSIS — N400 Enlarged prostate without lower urinary tract symptoms: Secondary | ICD-10-CM | POA: Diagnosis present

## 2021-01-19 DIAGNOSIS — G9341 Metabolic encephalopathy: Secondary | ICD-10-CM | POA: Diagnosis not present

## 2021-01-19 DIAGNOSIS — Z7401 Bed confinement status: Secondary | ICD-10-CM | POA: Diagnosis not present

## 2021-01-19 DIAGNOSIS — R0902 Hypoxemia: Secondary | ICD-10-CM | POA: Diagnosis not present

## 2021-01-19 DIAGNOSIS — S0081XA Abrasion of other part of head, initial encounter: Secondary | ICD-10-CM | POA: Diagnosis not present

## 2021-01-19 DIAGNOSIS — R918 Other nonspecific abnormal finding of lung field: Secondary | ICD-10-CM | POA: Diagnosis not present

## 2021-01-19 NOTE — ED Triage Notes (Signed)
Patient from [place], Complaint of fall, no head injury, pt on eliquis. Pt A&O to baseline

## 2021-01-19 NOTE — ED Notes (Signed)
Pt transported to CT ?

## 2021-01-19 NOTE — ED Provider Notes (Signed)
[facility] EMERGENCY DEPARTMENT Provider Note   CSN: [id number] Arrival date & time: 01/19/21  1029     History Chief Complaint  Patient presents with  . Crafton is a 87 y.o. male.  HPI   Patient is an 87 year old male with a history of COPD, CAD, diabetes mellitus, memory loss, who presents the emergency department due to a fall.  Patient came to the emergency department via EMS from [facility].  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 Per EMS, pt had a fall this AM. No further details provided. Pt has healing scab to the forehead from a previous fall but no other signs of new injury.  He is anticoagulated on Eliquis.  I spoke to nursing staff at [facility].  They state the patient "has been up and down all morning and not using his walker".  He was walking without a walker down the hall and when seeing staff decided to run away, tripped, and then fell striking his head on the hardwood floor.       Past Medical History:  Diagnosis Date  . Aorto-iliac atherosclerosis ([place]) 08/2015   by xray  . Barrett's esophagus    EGD 2012, rpt 2014, longterm PPI  . Chronic idiopathic constipation 07/17/2010  . Chronic obstructive pulmonary disease (COPD) (H[facility])    spirometry with moderate COPD  . Coronary atherosclerosis of artery bypass graft   . Diabetes mellitus type II ~2008  . Gastrointestinal ulcer 04/2011   with hematemesis s/p EGD by [facility]  . GERD (gastroesophageal reflux disease)   . Hearing loss   . HLD (hyperlipidemia)   . HNP (herniated nucleus pulposus), lumbar 2013   with compression of L5 nerve root and foot drop, also with lumbar DDD s/p surgery  . Hypertrophy of prostate without urinary obstruction and other lower urinary tract symptoms (LUTS)   . Memory loss   . Paroxysmal SVT (supraventricular tachycardia) ([place])    a. 3/14 => converted in ED with adenosine to  AFib => NSR;  b. Echo 4/14:  Mild LVH, mild FBSH, EF 55-60%, Gr 1 DD  . Prostate nodule ~2009   Left-benign s/p eval Uro WNL ([place])  . Thrombocytopenia ([place])   . Tobacco use disorder     Patient Active Problem List   Diagnosis Date Noted  . Dermatitis 12/06/2020  . DNR (do not resuscitate) 11/28/2020  . CKD (chronic kidney disease), stage III ([place]) 10/17/2020  . Unspecified protein-calorie malnutrition ([place]) 10/17/2020  . Atrial fibrillation with RVR ([place]) 09/21/2020  . Dysphagia 09/21/2020  . Severe hypoxemia 09/10/2020  . Acute respiratory disease due to COVID-19 virus 09/10/2020  . Acute respiratory failure with hypoxemia ([place]) 09/10/2020  . AKI (acute kidney injury) ([place]) 09/10/2020  . Chronic diastolic CHF (congestive heart failure) ([place]) 09/10/2020  . Cerebral vascular disease 04/10/2020  . Diabetic cataract of both eyes ([place]) 01/24/2020  . Vitamin B12 deficiency 08/03/2019  . History of stroke 08/03/2019  . Pseudodementia 10/01/2018  . Sensorineural hearing loss (SNHL) of both ears 12/31/2017  . Candidal intertrigo 12/22/2017  . Left serous otitis media 07/04/2017  . Hypertension associated with diabetes ([place]) 05/22/2017  . Eustachian tube dysfunction, left 04/04/2017  . Dry skin dermatitis 12/02/2016  . Health maintenance examination 10/08/2016  . Advanced care planning/counseling discussion 10/08/2016  . Thyromegaly 10/08/2016  . Anemia 10/08/2016  . Aorto-iliac atherosclerosis ([place]) 08/17/2015  .  Bleeding hemorrhoid 06/13/2015  . Dementia without behavioral disturbance ([place]) 06/13/2015  . Orthostatic dizziness 08/01/2014  . Paresthesia of foot, bilateral 08/01/2014  . Supraventricular tachycardia ([place]) 11/24/2012  . CAD (coronary artery disease) 11/24/2012  . COPD exacerbation ([place]) 11/10/2012  . Lumbosacral radiculopathy at L5 12/23/2011  . Thrombocytopenia ([place])   . Barrett's esophagus   . Skin lesion of back 09/16/2011  . PUD (peptic ulcer disease)  05/16/2011  . Medicare annual wellness visit, subsequent 04/30/2011  . COPD (chronic obstructive pulmonary disease) ([place]) 12/26/2010  . Diabetes mellitus type 2 with complications ([place]) [id number]  . Chronic idiopathic constipation 07/17/2010  . Hyperlipidemia associated with type 2 diabetes mellitus ([place]) 05/13/2009  . Tobacco use 05/13/2009    Past Surgical History:  Procedure Laterality Date  . COLONOSCOPY  03/2009   small hemorrhoids, 2 polyps (hyperplastic and adenomatous), rpt 5 yrs (Dr. Barron Schmid) - pt declined rpt scheduling  . COLONOSCOPY  10/2014   2 hyperplastic polyp, hemmorhoids ([place])  . CORONARY ARTERY BYPASS GRAFT  1999   Dr. Roxy Manns  . ESOPHAGOGASTRODUODENOSCOPY  05/16/2011   after hematemesis, no lesions found, consistent with barrett's esophagus rpt 2 yrs  . ESOPHAGOGASTRODUODENOSCOPY  06/2013   barrett's, small HH, recheck 2-3 yrs ([place])  . HEMORRHOID SURGERY    . LUMBAR LAMINECTOMY/DECOMPRESSION MICRODISCECTOMY  01/31/2012   Procedure: LUMBAR LAMINECTOMY/DECOMPRESSION MICRODISCECTOMY 1 LEVEL;  Surgeon: Winfield Cunas, MD;  Location: [facility];  Service: Neurosurgery;  Laterality: Left;  LEFT Lumbar Four-Five Diskectomy  . NOSE SURGERY    . ROTATOR CUFF REPAIR  04/2010   right (but has bilat) Dr. Latanya Maudlin ([facility])       Family History  Problem Relation Age of Onset  . Cancer Father 90       colon  . Memory loss Mother     Social History   Tobacco Use  . Smoking status: Current Every Day Smoker    Packs/day: 1.00    Years: 60.00    Pack years: 60.00    Types: Cigarettes    Start date: 09/16/1952  . Smokeless tobacco: Current User    Types: Snuff  . Tobacco comment: < 1 ppd; does dip sometimes  Vaping Use  . Vaping Use: Never used  Substance Use Topics  . Alcohol use: Yes  . Drug use: No    Home Medications Prior to Admission medications   Medication Sig Start Date End Date Taking? Authorizing Provider  albuterol (VENTOLIN HFA) 108 (90  Base) MCG/ACT inhaler Inhale 2 puffs into the lungs every 6 (six) hours as needed for wheezing. 12/13/20   [provider]  apixaban (ELIQUIS) 2.5 MG TABS tablet Take 1 tablet (2.5 mg total) by mouth 2 (two) times daily. 09/19/20   Thurnell Lose, MD  BREO ELLIPTA 100-25 MCG/INH AEPB Inhale 1 puff into the lungs daily. 12/14/20   [provider]  donepezil (ARICEPT) 10 MG tablet Take 1 tablet (10 mg total) by mouth at bedtime. 04/10/20   Marcial Pacas, MD  donepezil (ARICEPT) 10 MG tablet Take 10 mg by mouth at bedtime. 12/25/20   [provider]  ELIQUIS 2.5 MG TABS tablet Take 2.5 mg by mouth 2 (two) times daily. 12/21/20   [provider]  fluticasone furoate-vilanterol (BREO ELLIPTA) 100-25 MCG/INH AEPB Inhale 1 puff into the lungs daily. For COPD    [provider]  hydrocortisone butyrate (LUCOID) 0.1 % CREA cream Apply 1 application topically 2 (two) times daily. Apply to hands,  chest and back    [provider]  HYDROCORTISONE, TOPICAL, 1 % SOLN Apply 1 application topically See admin instructions. Apply to hands,chest and back  twice daily for rash    [provider]  isosorbide mononitrate (IMDUR) 30 MG 24 hr tablet TAKE 1 TABLET BY MOUTH  DAILY Patient taking differently: Take 30 mg by mouth daily. 12/13/20   [name], MD  isosorbide mononitrate (IMDUR) 30 MG 24 hr tablet Take 30 mg by mouth daily. 01/15/21   [provider]  melatonin 5 MG TABS Take 5 mg by mouth at bedtime. For Insomnia    [provider]  melatonin 5 MG TABS Take 5 mg by mouth at bedtime.    [provider]  memantine (NAMENDA) 10 MG tablet Take 1 tablet (10 mg total) by mouth 2 (two) times daily. 04/10/20   Marcial Pacas, MD  memantine (NAMENDA) 10 MG tablet Take 10 mg by mouth 2 (two) times daily. 12/24/20   [provider]  metFORMIN (GLUCOPHAGE) 500 MG tablet Take 1 tablet (500 mg total) by mouth daily with breakfast. 01/24/20    [name], MD  metFORMIN (GLUCOPHAGE) 500 MG tablet Take 500 mg by mouth daily. 12/21/20   [provider]  metoprolol tartrate (LOPRESSOR) 25 MG tablet TAKE ONE-HALF TABLET BY  MOUTH TWICE DAILY Patient taking differently: Take 12.5 mg by mouth 2 (two) times daily. 11/21/20   [name], MD  metoprolol tartrate (LOPRESSOR) 25 MG tablet Take 12.5 mg by mouth in the morning and at bedtime. 12/24/20   [provider]  nicotine (NICODERM CQ - DOSED IN MG/24 HOURS) 14 mg/24hr patch Place 14 mg onto the skin daily as needed (tobacco addiction). 01/15/21   [provider]  nicotine (NICODERM CQ - DOSED IN MG/24 HOURS) 21 mg/24hr patch Place 21 mg onto the skin daily.    [provider]  nicotine (NICODERM CQ - DOSED IN MG/24 HOURS) 21 mg/24hr patch Place 21 mg onto the skin daily. Patient not taking: No sig reported    [provider]  omeprazole (PRILOSEC) 40 MG capsule TAKE 1 CAPSULE BY MOUTH  DAILY Patient taking differently: Take 40 mg by mouth daily. 11/21/20   [name], MD  omeprazole (PRILOSEC) 40 MG capsule Take 40 mg by mouth daily. 12/27/20   [provider]  OXYGEN Inhale 2 L into the lungs daily as needed (Shortness of breath).    [provider]  OXYGEN Inhale 2 L into the lungs daily as needed (shortness of breath).    [provider]  sertraline (ZOLOFT) 50 MG tablet Take 75 mg by mouth daily.    [provider]  sertraline (ZOLOFT) 50 MG tablet Take 75 mg by mouth daily. 12/21/20   [provider]  simvastatin (ZOCOR) 40 MG tablet TAKE 1 TABLET BY MOUTH AT  BEDTIME Patient taking differently: Take 40 mg by mouth daily at 6 PM. 11/06/20   [name], MD  simvastatin (ZOCOR) 40 MG tablet Take 40 mg by mouth at bedtime. 12/24/20   [provider]  tamsulosin (FLOMAX) 0.4 MG CAPS capsule Take 1 capsule (0.4 mg total) by mouth daily. 09/20/20   Thurnell Lose, MD  tamsulosin  (FLOMAX) 0.4 MG CAPS capsule Take 0.4 mg by mouth daily. 12/24/20   [provider]  valproic acid (DEPAKENE) 250 MG/5ML solution Take 250 mg by mouth 4 (four) times daily.    [provider]  valproic acid (DEPAKENE) 250  MG/5ML solution Take 250 mg by mouth 4 (four) times daily. 01/06/21   [provider]  vitamin B-12 (CYANOCOBALAMIN) 500 MCG tablet Take 1 tablet (500 mcg total) by mouth every Monday, Wednesday, and Friday. 01/28/20   [name], MD  vitamin B-12 (CYANOCOBALAMIN) 500 MCG tablet Take 500 mcg by mouth See admin instructions. Monday,Wednesday and friday    [provider]    Allergies    Codeine, Cyclobenzaprine hcl, Flexeril [cyclobenzaprine], Hydrocodone, Hydrocodone, Sulfa antibiotics, Tramadol, Tramadol, Valium, and Valium [diazepam]  Review of Systems   Review of Systems  All other systems reviewed and are negative. Ten systems reviewed and are negative for acute change, except as noted in the HPI.   Physical Exam Updated Vital Signs BP (!) 146/81   Pulse 85   Temp 97.7 F (36.5 C) (Oral)   Resp 16   SpO2 98%   Physical Exam Vitals and nursing note reviewed.  Constitutional:      General: He is not in acute distress.    Appearance: Normal appearance. He is not ill-appearing, toxic-appearing or diaphoretic.  HENT:     Head: Normocephalic and atraumatic.     Right Ear: External ear normal.     Left Ear: External ear normal.     Nose: Nose normal.     Mouth/Throat:     Mouth: Mucous membranes are moist.     Pharynx: Oropharynx is clear. No oropharyngeal exudate or posterior oropharyngeal erythema.  Eyes:     General: No scleral icterus.       Right eye: No discharge.        Left eye: No discharge.     Extraocular Movements: Extraocular movements intact.     Conjunctiva/sclera: Conjunctivae normal.     Pupils: Pupils are equal, round, and reactive to light.  Cardiovascular:     Rate and Rhythm: Normal rate and  regular rhythm.     Pulses: Normal pulses.     Heart sounds: Normal heart sounds. No murmur heard. No friction rub. No gallop.   Pulmonary:     Effort: Pulmonary effort is normal. No respiratory distress.     Breath sounds: Normal breath sounds. No stridor. No wheezing, rhonchi or rales.     Comments: LCTAB.  Abdominal:     General: Abdomen is flat.     Palpations: Abdomen is soft.     Tenderness: There is no abdominal tenderness.     Comments: Abdomen is soft and nontender in all 4 quadrants.  Musculoskeletal:        General: Normal range of motion.     Cervical back: Normal range of motion and neck supple. No tenderness.  Skin:    General: Skin is warm and dry.     Comments: Healing scab noted to the forehead.  No other new signs of wounds or trauma.  Diffuse ecchymosis noted to the extremities.  Neurological:     General: No focal deficit present.     Mental Status: He is alert.     Comments: Oriented to self.  Moving all 4 extremities with ease.  Strength is 5 out of 5 in all 4 extremities.  Psychiatric:        Mood and Affect: Mood normal.        Behavior: Behavior normal.    ED Results / Procedures / Treatments   Labs (all labs ordered are listed, but only abnormal results are displayed) Labs Reviewed - No data to display  EKG None  Radiology CT Head Wo Contrast  Result Date: 01/19/2021 CLINICAL DATA:  Fall. Head trauma. Patient on blood thinners. Coagulopathy. EXAM: CT HEAD WITHOUT CONTRAST CT CERVICAL SPINE WITHOUT CONTRAST TECHNIQUE: Multidetector CT imaging of the head and cervical spine was performed following the standard protocol without intravenous contrast. Multiplanar CT image reconstructions of the cervical spine were also generated. COMPARISON:  CT of the head without contrast 01/16/2021 for the same indication in 09/10/2020 FINDINGS: CT HEAD FINDINGS Brain: Moderate atrophy and white matter disease is stable. Remote lacunar infarcts of the basal ganglia are  stable. No acute infarct, hemorrhage, or mass lesion is present. The ventricles are proportionate to the degree of atrophy. No significant extraaxial fluid collection is present. The brainstem and cerebellum are within normal limits. Vascular: Atherosclerotic calcifications are present in the cavernous internal carotid arteries and at the dural margin of both vertebral arteries. No significant hyperdense vessel is present. Skull: Minimal supraorbital frontal scalp soft tissue swelling is improved. No new soft tissue injuries are present. Calvarium is intact. Sinuses/Orbits: The paranasal sinuses and mastoid air cells are clear. The globes and orbits are within normal limits. CT CERVICAL SPINE FINDINGS Alignment: No significant listhesis or change is present. Skull base and vertebrae: A cervical junction is normal. Soft tissues and spinal canal: No prevertebral fluid or swelling. No visible canal hematoma. Atherosclerotic calcifications are present at the carotid bifurcations bilaterally without definite stenosis. Disc levels: Uncovertebral spurring and foraminal stenosis is greatest at C6-7 bilaterally and on the left at C5-6. Upper chest: Centrilobular emphysematous changes noted. Scarring at the right apex is stable. IMPRESSION: 1. No acute intracranial abnormality or significant interval change. 2. Stable atrophy and white matter disease. 3. Remote lacunar infarcts of the basal ganglia are stable. 4. Improved supraorbital frontal scalp soft tissue swelling. 5. No acute fracture or traumatic subluxation in the cervical spine. 6. Stable multilevel degenerative changes of the cervical spine as described. 7. Emphysema (ICD10-J43.9). Electronically Signed   By: San Morelle [name]   On: 01/19/2021 17:29   CT Cervical Spine Wo Contrast  Result Date: 01/19/2021 CLINICAL DATA:  Fall. Head trauma. Patient on blood thinners. Coagulopathy. EXAM: CT HEAD WITHOUT CONTRAST CT CERVICAL SPINE WITHOUT CONTRAST TECHNIQUE:  Multidetector CT imaging of the head and cervical spine was performed following the standard protocol without intravenous contrast. Multiplanar CT image reconstructions of the cervical spine were also generated. COMPARISON:  CT of the head without contrast 01/16/2021 for the same indication in 09/10/2020 FINDINGS: CT HEAD FINDINGS Brain: Moderate atrophy and white matter disease is stable. Remote lacunar infarcts of the basal ganglia are stable. No acute infarct, hemorrhage, or mass lesion is present. The ventricles are proportionate to the degree of atrophy. No significant extraaxial fluid collection is present. The brainstem and cerebellum are within normal limits. Vascular: Atherosclerotic calcifications are present in the cavernous internal carotid arteries and at the dural margin of both vertebral arteries. No significant hyperdense vessel is present. Skull: Minimal supraorbital frontal scalp soft tissue swelling is improved. No new soft tissue injuries are present. Calvarium is intact. Sinuses/Orbits: The paranasal sinuses and mastoid air cells are clear. The globes and orbits are within normal limits. CT CERVICAL SPINE FINDINGS Alignment: No significant listhesis or change is present. Skull base and vertebrae: A cervical junction is normal. Soft tissues and spinal canal: No prevertebral fluid or swelling. No visible canal hematoma. Atherosclerotic calcifications are present at the carotid bifurcations bilaterally without definite stenosis. Disc levels: Uncovertebral spurring and foraminal stenosis is greatest  at C6-7 bilaterally and on the left at C5-6. Upper chest: Centrilobular emphysematous changes noted. Scarring at the right apex is stable. IMPRESSION: 1. No acute intracranial abnormality or significant interval change. 2. Stable atrophy and white matter disease. 3. Remote lacunar infarcts of the basal ganglia are stable. 4. Improved supraorbital frontal scalp soft tissue swelling. 5. No acute fracture  or traumatic subluxation in the cervical spine. 6. Stable multilevel degenerative changes of the cervical spine as described. 7. Emphysema (ICD10-J43.9). Electronically Signed   By: San Morelle [name]   On: 01/19/2021 17:29   DG Chest Portable 1 View  Result Date: 01/19/2021 CLINICAL DATA:  Patient status post fall. EXAM: PORTABLE CHEST 1 VIEW COMPARISON:  Single-view of the chest 01/16/2021. CT chest 09/10/2020. FINDINGS: The lungs are emphysematous but clear. The patient is status post CABG. Aortic atherosclerosis noted. No pneumothorax or pleural effusion. Heart size is normal. No acute or focal bony abnormality. IMPRESSION: No acute disease. Aortic Atherosclerosis (ICD10-I70.0) and Emphysema (ICD10-J43.9). Electronically Signed   By: Inge Rise [name]   On: 01/19/2021 11:47    Procedures Procedures   Medications Ordered in ED Medications - No data to display  ED Course  I have reviewed the triage vital signs and the nursing notes.  Pertinent labs & imaging results that were available during my care of the patient were reviewed by me and considered in my medical decision making (see chart for details).    MDM Rules/Calculators/A&P                          Pt is a 87 y.o. male who presents the emergency department due to a fall.  He is anticoagulated on Eliquis.  Imaging: CT scan of the head shows no acute intracranial abnormalities or significant interval change. CT scan of the neck shows no acute fracture or traumatic subluxation in the cervical spine. Chest x-ray is negative.  I, Rayna Sexton, PA-C, personally reviewed and evaluated these images and lab results as part of my medical decision-making.  Imaging of patient's head, neck, and chest is reassuring.  Does not appear to be any other signs of trauma to the back, abdomen, or extremities.  He is moving all 4 extremities with ease.  Appears to be at his baseline.  Feel the patient is stable at this time.  His son  is at bedside and is agreeable.  His son is going to transport him back to his nursing facility.  Discussed return precautions.  Their questions were answered and they were amicable at the time of discharge.  Note: Portions of this report may have been transcribed using voice recognition software. Every effort was made to ensure accuracy; however, inadvertent computerized transcription errors may be present.    Final Clinical Impression(s) / ED Diagnoses Final diagnoses:  Fall, initial encounter  Minor head injury, initial encounter    Rx / DC Orders ED Discharge Orders    None       Rayna Sexton, PA-C 01/19/21 1751    Davonna Belling, MD 02/09/2021 [phone]

## 2021-01-19 NOTE — Discharge Instructions (Addendum)
Like we discussed, please keep an eye on Troy [name] and continue to monitor his symptoms.  If he develops any worsening symptoms, please bring him back to the emergency department immediately for reevaluation.

## 2021-01-20 ENCOUNTER — Emergency Department (HOSPITAL_COMMUNITY): Payer: Medicare Other

## 2021-01-20 ENCOUNTER — Emergency Department (HOSPITAL_COMMUNITY)
Admission: EM | Admit: 2021-01-20 | Discharge: 2021-01-20 | Disposition: A | Discharge status: Home / Self Care | Payer: Medicare Other | Source: Home / Self Care | Attending: Emergency Medicine | Admitting: Emergency Medicine

## 2021-01-20 ENCOUNTER — Inpatient Hospital Stay (HOSPITAL_COMMUNITY)
Admission: EM | Admit: 2021-01-20 | Discharge: 2021-02-14 | DRG: 082 | Disposition: E | Payer: Medicare Other | Source: Skilled Nursing Facility | Attending: Internal Medicine | Admitting: Internal Medicine

## 2021-01-20 ENCOUNTER — Encounter (HOSPITAL_COMMUNITY): Payer: Self-pay | Admitting: Internal Medicine

## 2021-01-20 DIAGNOSIS — Z951 Presence of aortocoronary bypass graft: Secondary | ICD-10-CM | POA: Insufficient documentation

## 2021-01-20 DIAGNOSIS — R4182 Altered mental status, unspecified: Secondary | ICD-10-CM

## 2021-01-20 DIAGNOSIS — F17210 Nicotine dependence, cigarettes, uncomplicated: Secondary | ICD-10-CM | POA: Insufficient documentation

## 2021-01-20 DIAGNOSIS — E1122 Type 2 diabetes mellitus with diabetic chronic kidney disease: Secondary | ICD-10-CM | POA: Diagnosis present

## 2021-01-20 DIAGNOSIS — I2510 Atherosclerotic heart disease of native coronary artery without angina pectoris: Secondary | ICD-10-CM | POA: Diagnosis present

## 2021-01-20 DIAGNOSIS — Z7984 Long term (current) use of oral hypoglycemic drugs: Secondary | ICD-10-CM | POA: Insufficient documentation

## 2021-01-20 DIAGNOSIS — D6489 Other specified anemias: Secondary | ICD-10-CM | POA: Diagnosis present

## 2021-01-20 DIAGNOSIS — Z7189 Other specified counseling: Secondary | ICD-10-CM

## 2021-01-20 DIAGNOSIS — W19XXXA Unspecified fall, initial encounter: Secondary | ICD-10-CM | POA: Insufficient documentation

## 2021-01-20 DIAGNOSIS — F0390 Unspecified dementia without behavioral disturbance: Secondary | ICD-10-CM | POA: Diagnosis present

## 2021-01-20 DIAGNOSIS — D649 Anemia, unspecified: Secondary | ICD-10-CM | POA: Diagnosis present

## 2021-01-20 DIAGNOSIS — S0081XA Abrasion of other part of head, initial encounter: Secondary | ICD-10-CM | POA: Insufficient documentation

## 2021-01-20 DIAGNOSIS — J441 Chronic obstructive pulmonary disease with (acute) exacerbation: Secondary | ICD-10-CM | POA: Insufficient documentation

## 2021-01-20 DIAGNOSIS — Z28310 Unvaccinated for covid-19: Secondary | ICD-10-CM

## 2021-01-20 DIAGNOSIS — E785 Hyperlipidemia, unspecified: Secondary | ICD-10-CM | POA: Diagnosis present

## 2021-01-20 DIAGNOSIS — I130 Hypertensive heart and chronic kidney disease with heart failure and stage 1 through stage 4 chronic kidney disease, or unspecified chronic kidney disease: Secondary | ICD-10-CM | POA: Insufficient documentation

## 2021-01-20 DIAGNOSIS — Z7901 Long term (current) use of anticoagulants: Secondary | ICD-10-CM

## 2021-01-20 DIAGNOSIS — Y92098 Other place in other non-institutional residence as the place of occurrence of the external cause: Secondary | ICD-10-CM

## 2021-01-20 DIAGNOSIS — N400 Enlarged prostate without lower urinary tract symptoms: Secondary | ICD-10-CM | POA: Diagnosis present

## 2021-01-20 DIAGNOSIS — R52 Pain, unspecified: Secondary | ICD-10-CM

## 2021-01-20 DIAGNOSIS — E1169 Type 2 diabetes mellitus with other specified complication: Secondary | ICD-10-CM | POA: Diagnosis present

## 2021-01-20 DIAGNOSIS — S065X9A Traumatic subdural hemorrhage with loss of consciousness of unspecified duration, initial encounter: Principal | ICD-10-CM | POA: Diagnosis present

## 2021-01-20 DIAGNOSIS — N183 Chronic kidney disease, stage 3 unspecified: Secondary | ICD-10-CM | POA: Insufficient documentation

## 2021-01-20 DIAGNOSIS — N1832 Chronic kidney disease, stage 3b: Secondary | ICD-10-CM | POA: Diagnosis present

## 2021-01-20 DIAGNOSIS — S01511A Laceration without foreign body of lip, initial encounter: Secondary | ICD-10-CM | POA: Diagnosis not present

## 2021-01-20 DIAGNOSIS — E538 Deficiency of other specified B group vitamins: Secondary | ICD-10-CM | POA: Diagnosis present

## 2021-01-20 DIAGNOSIS — Z79899 Other long term (current) drug therapy: Secondary | ICD-10-CM | POA: Insufficient documentation

## 2021-01-20 DIAGNOSIS — E118 Type 2 diabetes mellitus with unspecified complications: Secondary | ICD-10-CM | POA: Diagnosis present

## 2021-01-20 DIAGNOSIS — Z515 Encounter for palliative care: Secondary | ICD-10-CM

## 2021-01-20 DIAGNOSIS — I6200 Nontraumatic subdural hemorrhage, unspecified: Secondary | ICD-10-CM

## 2021-01-20 DIAGNOSIS — K219 Gastro-esophageal reflux disease without esophagitis: Secondary | ICD-10-CM | POA: Diagnosis present

## 2021-01-20 DIAGNOSIS — S065XAA Traumatic subdural hemorrhage with loss of consciousness status unknown, initial encounter: Secondary | ICD-10-CM | POA: Diagnosis present

## 2021-01-20 DIAGNOSIS — Z7951 Long term (current) use of inhaled steroids: Secondary | ICD-10-CM

## 2021-01-20 DIAGNOSIS — R296 Repeated falls: Secondary | ICD-10-CM | POA: Diagnosis present

## 2021-01-20 DIAGNOSIS — W1830XA Fall on same level, unspecified, initial encounter: Secondary | ICD-10-CM | POA: Diagnosis present

## 2021-01-20 DIAGNOSIS — I5032 Chronic diastolic (congestive) heart failure: Secondary | ICD-10-CM | POA: Diagnosis present

## 2021-01-20 DIAGNOSIS — I4891 Unspecified atrial fibrillation: Secondary | ICD-10-CM | POA: Diagnosis present

## 2021-01-20 DIAGNOSIS — F0150 Vascular dementia without behavioral disturbance: Secondary | ICD-10-CM | POA: Diagnosis not present

## 2021-01-20 DIAGNOSIS — I25810 Atherosclerosis of coronary artery bypass graft(s) without angina pectoris: Secondary | ICD-10-CM | POA: Diagnosis present

## 2021-01-20 DIAGNOSIS — Z789 Other specified health status: Secondary | ICD-10-CM

## 2021-01-20 DIAGNOSIS — Z9981 Dependence on supplemental oxygen: Secondary | ICD-10-CM

## 2021-01-20 DIAGNOSIS — Z87891 Personal history of nicotine dependence: Secondary | ICD-10-CM

## 2021-01-20 DIAGNOSIS — Z66 Do not resuscitate: Secondary | ICD-10-CM

## 2021-01-20 DIAGNOSIS — Z8616 Personal history of COVID-19: Secondary | ICD-10-CM

## 2021-01-20 DIAGNOSIS — J449 Chronic obstructive pulmonary disease, unspecified: Secondary | ICD-10-CM | POA: Diagnosis present

## 2021-01-20 DIAGNOSIS — G9341 Metabolic encephalopathy: Secondary | ICD-10-CM | POA: Diagnosis present

## 2021-01-20 DIAGNOSIS — F0391 Unspecified dementia with behavioral disturbance: Secondary | ICD-10-CM | POA: Diagnosis present

## 2021-01-20 LAB — URINALYSIS, ROUTINE W REFLEX MICROSCOPIC
Bacteria, UA: NONE SEEN
Bilirubin Urine: NEGATIVE
Glucose, UA: NEGATIVE mg/dL
Ketones, ur: 5 mg/dL — AB
Leukocytes,Ua: NEGATIVE
Nitrite: NEGATIVE
Protein, ur: 30 mg/dL — AB
Specific Gravity, Urine: 1.019 (ref 1.005–1.030)
pH: 6 (ref 5.0–8.0)

## 2021-01-20 LAB — CBC WITH DIFFERENTIAL/PLATELET
Abs Immature Granulocytes: 0.13 10*3/uL — ABNORMAL HIGH (ref 0.00–0.07)
Basophils Absolute: 0 10*3/uL (ref 0.0–0.1)
Basophils Relative: 1 %
Eosinophils Absolute: 0.1 10*3/uL (ref 0.0–0.5)
Eosinophils Relative: 2 %
HCT: 36.6 % — ABNORMAL LOW (ref 39.0–52.0)
Hemoglobin: 11.8 g/dL — ABNORMAL LOW (ref 13.0–17.0)
Immature Granulocytes: 2 %
Lymphocytes Relative: 27 %
Lymphs Abs: 2.3 10*3/uL (ref 0.7–4.0)
MCH: 29.9 pg (ref 26.0–34.0)
MCHC: 32.2 g/dL (ref 30.0–36.0)
MCV: 92.7 fL (ref 80.0–100.0)
Monocytes Absolute: 0.9 10*3/uL (ref 0.1–1.0)
Monocytes Relative: 11 %
Neutro Abs: 5 10*3/uL (ref 1.7–7.7)
Neutrophils Relative %: 57 %
Platelets: 133 10*3/uL — ABNORMAL LOW (ref 150–400)
RBC: 3.95 MIL/uL — ABNORMAL LOW (ref 4.22–5.81)
RDW: 14.2 % (ref 11.5–15.5)
WBC: 8.5 10*3/uL (ref 4.0–10.5)
nRBC: 0 % (ref 0.0–0.2)

## 2021-01-20 LAB — GLUCOSE, CAPILLARY
Glucose-Capillary: 110 mg/dL — ABNORMAL HIGH (ref 70–99)
Glucose-Capillary: 117 mg/dL — ABNORMAL HIGH (ref 70–99)

## 2021-01-20 LAB — PROTIME-INR
INR: 1.2 (ref 0.8–1.2)
Prothrombin Time: 15.2 seconds (ref 11.4–15.2)

## 2021-01-20 LAB — CBG MONITORING, ED: Glucose-Capillary: 125 mg/dL — ABNORMAL HIGH (ref 70–99)

## 2021-01-20 LAB — SARS CORONAVIRUS 2 (TAT 6-24 HRS): SARS Coronavirus 2: NEGATIVE

## 2021-01-20 LAB — APTT: aPTT: 34 seconds (ref 24–36)

## 2021-01-20 LAB — VALPROIC ACID LEVEL: Valproic Acid Lvl: 12 ug/mL — ABNORMAL LOW (ref 50.0–100.0)

## 2021-01-20 MED ORDER — DOCUSATE SODIUM 100 MG PO CAPS: 100.0000 mg | ORAL_CAPSULE | Freq: Two times a day (BID) | ORAL | Status: DC

## 2021-01-20 MED ORDER — ACETAMINOPHEN 650 MG RE SUPP: 650.0000 mg | Freq: Four times a day (QID) | RECTAL | Status: DC | PRN

## 2021-01-20 MED ORDER — MEMANTINE HCL 10 MG PO TABS
10.0000 mg | ORAL_TABLET | Freq: Two times a day (BID) | ORAL | Status: DC
  Filled 2021-01-20 (×2): qty 1

## 2021-01-20 MED ORDER — POLYETHYLENE GLYCOL 3350 17 G PO PACK: 17.0000 g | PACK | Freq: Every day | ORAL | Status: DC | PRN

## 2021-01-20 MED ORDER — BISACODYL 5 MG PO TBEC: 5.0000 mg | DELAYED_RELEASE_TABLET | Freq: Every day | ORAL | Status: DC | PRN

## 2021-01-20 MED ORDER — SIMVASTATIN 20 MG PO TABS: 40.0000 mg | ORAL_TABLET | Freq: Every day | ORAL | Status: DC

## 2021-01-20 MED ORDER — ISOSORBIDE MONONITRATE ER 30 MG PO TB24: 30.0000 mg | ORAL_TABLET | Freq: Every day | ORAL | Status: DC

## 2021-01-20 MED ORDER — MORPHINE SULFATE (PF) 2 MG/ML IV SOLN
2.0000 mg | INTRAVENOUS | Status: DC | PRN
  Administered 2021-01-20 – 2021-01-21 (×2): 2 mg via INTRAVENOUS
  Filled 2021-01-20 (×2): qty 1

## 2021-01-20 MED ORDER — HALOPERIDOL LACTATE 5 MG/ML IJ SOLN
5.0000 mg | Freq: Four times a day (QID) | INTRAMUSCULAR | Status: DC | PRN
  Filled 2021-01-20: qty 1

## 2021-01-20 MED ORDER — METOPROLOL TARTRATE 5 MG/5ML IV SOLN
5.0000 mg | Freq: Once | INTRAVENOUS | Status: AC
  Administered 2021-01-20: 5 mg via INTRAVENOUS
  Filled 2021-01-20: qty 5

## 2021-01-20 MED ORDER — ONDANSETRON HCL 4 MG PO TABS: 4.0000 mg | ORAL_TABLET | Freq: Four times a day (QID) | ORAL | Status: DC | PRN

## 2021-01-20 MED ORDER — SERTRALINE HCL 50 MG PO TABS
75.0000 mg | ORAL_TABLET | Freq: Every day | ORAL | Status: DC
  Filled 2021-01-20: qty 1

## 2021-01-20 MED ORDER — LIDOCAINE-EPINEPHRINE (PF) 2 %-1:200000 IJ SOLN
10.0000 mL | Freq: Once | INTRAMUSCULAR | Status: AC
  Administered 2021-01-20: 10 mL
  Filled 2021-01-20: qty 20

## 2021-01-20 MED ORDER — PANTOPRAZOLE SODIUM 40 MG PO TBEC: 40.0000 mg | DELAYED_RELEASE_TABLET | Freq: Every day | ORAL | Status: DC

## 2021-01-20 MED ORDER — MELATONIN 5 MG PO TABS: 5.0000 mg | ORAL_TABLET | Freq: Every day | ORAL | Status: DC

## 2021-01-20 MED ORDER — PROTHROMBIN COMPLEX CONC HUMAN 500 UNITS IV KIT
3228.0000 [IU] | PACK | INTRAVENOUS | Status: AC
  Administered 2021-01-20: 3228 [IU] via INTRAVENOUS
  Filled 2021-01-20: qty 3228

## 2021-01-20 MED ORDER — METOPROLOL TARTRATE 12.5 MG HALF TABLET: 12.5000 mg | ORAL_TABLET | Freq: Two times a day (BID) | ORAL | Status: DC

## 2021-01-20 MED ORDER — VALPROIC ACID 250 MG/5ML PO SOLN
250.0000 mg | Freq: Four times a day (QID) | ORAL | Status: DC
  Filled 2021-01-20 (×7): qty 5

## 2021-01-20 MED ORDER — HYDRALAZINE HCL 20 MG/ML IJ SOLN
5.0000 mg | INTRAMUSCULAR | Status: DC | PRN
  Administered 2021-01-20: 5 mg via INTRAVENOUS
  Filled 2021-01-20: qty 1

## 2021-01-20 MED ORDER — NICOTINE 14 MG/24HR TD PT24: 14.0000 mg | MEDICATED_PATCH | Freq: Every day | TRANSDERMAL | Status: DC | PRN

## 2021-01-20 MED ORDER — DONEPEZIL HCL 10 MG PO TABS: 10.0000 mg | ORAL_TABLET | Freq: Every day | ORAL | Status: DC

## 2021-01-20 MED ORDER — TAMSULOSIN HCL 0.4 MG PO CAPS: 0.4000 mg | ORAL_CAPSULE | Freq: Every day | ORAL | Status: DC

## 2021-01-20 MED ORDER — SODIUM CHLORIDE 0.9% FLUSH
3.0000 mL | Freq: Two times a day (BID) | INTRAVENOUS | Status: DC
  Administered 2021-01-20 – 2021-01-21 (×2): 3 mL via INTRAVENOUS

## 2021-01-20 MED ORDER — ACETAMINOPHEN 325 MG PO TABS: 650.0000 mg | ORAL_TABLET | Freq: Four times a day (QID) | ORAL | Status: DC | PRN

## 2021-01-20 MED ORDER — ALBUTEROL SULFATE (2.5 MG/3ML) 0.083% IN NEBU: 2.5000 mg | INHALATION_SOLUTION | Freq: Four times a day (QID) | RESPIRATORY_TRACT | Status: DC | PRN

## 2021-01-20 MED ORDER — INSULIN ASPART 100 UNIT/ML IJ SOLN: 0.0000 [IU] | Freq: Three times a day (TID) | INTRAMUSCULAR | Status: DC

## 2021-01-20 MED ORDER — FLUTICASONE FUROATE-VILANTEROL 100-25 MCG/INH IN AEPB
1.0000 | INHALATION_SPRAY | Freq: Every day | RESPIRATORY_TRACT | Status: DC
  Filled 2021-01-20: qty 28

## 2021-01-20 MED ORDER — ONDANSETRON HCL 4 MG/2ML IJ SOLN: 4.0000 mg | Freq: Four times a day (QID) | INTRAMUSCULAR | Status: DC | PRN

## 2021-01-20 MED ORDER — LACTATED RINGERS IV SOLN: INTRAVENOUS | Status: DC

## 2021-01-20 NOTE — ED Triage Notes (Signed)
Patient comes from nursing home- found on floor, staff had put blanket on patient with right elbow abrasion. Unwitnessed fall. CKD,CHF, Dementia, TDM2

## 2021-01-20 NOTE — Consult Note (Signed)
Reason for Consult:Subdural hematoma, acute. Traumatic Referring Physician: Levander, Troy [name] is an 87 y.o. male.  HPI: whom fell multiple times in the last week, striking his head. He fell earlier today and was discharged, only to fall again. Most recent head CT revealed a small acute subdural hematoma for which I was consulted. Currently in the ED at [facility].   Past Medical History:  Diagnosis Date  . Aorto-iliac atherosclerosis ([place]) 08/2015   by xray  . Barrett's esophagus    EGD 2012, rpt 2014, longterm PPI  . Chronic idiopathic constipation 07/17/2010  . Chronic obstructive pulmonary disease (COPD) ([facility])    spirometry with moderate COPD  . Coronary atherosclerosis of artery bypass graft   . Diabetes mellitus type II ~2008  . Gastrointestinal ulcer 04/2011   with hematemesis s/p EGD by Troy [name]  . GERD (gastroesophageal reflux disease)   . Hearing loss   . HLD (hyperlipidemia)   . HNP (herniated nucleus pulposus), lumbar 2013   with compression of L5 nerve root and foot drop, also with lumbar DDD s/p surgery  . Hypertrophy of prostate without urinary obstruction and other lower urinary tract symptoms (LUTS)   . Memory loss   . Paroxysmal SVT (supraventricular tachycardia) ([place])    a. 3/14 => converted in ED with adenosine to AFib => NSR;  b. Echo 4/14:  Mild LVH, mild FBSH, EF 55-60%, Gr 1 DD  . Prostate nodule ~2009   Left-benign s/p eval Uro WNL ([place])  . Thrombocytopenia (Troy [name])   . Tobacco use disorder     Past Surgical History:  Procedure Laterality Date  . COLONOSCOPY  03/2009   small hemorrhoids, 2 polyps (hyperplastic and adenomatous), rpt 5 yrs (Dr. Barron Schmid) - pt declined rpt scheduling  . COLONOSCOPY  10/2014   2 hyperplastic polyp, hemmorhoids (Troy [name])  . CORONARY ARTERY BYPASS GRAFT  1999   Dr. Roxy [name]  . ESOPHAGOGASTRODUODENOSCOPY  05/16/2011   after hematemesis, no lesions found, consistent with barrett's esophagus rpt 2 yrs  .  ESOPHAGOGASTRODUODENOSCOPY  06/2013   barrett's, small HH, recheck 2-3 yrs (Troy [name])  . HEMORRHOID SURGERY    . LUMBAR LAMINECTOMY/DECOMPRESSION MICRODISCECTOMY  01/31/2012   Procedure: LUMBAR LAMINECTOMY/DECOMPRESSION MICRODISCECTOMY 1 LEVEL;  Surgeon: Troy Cunas, MD;  Location: [facility];  Service: Neurosurgery;  Laterality: Left;  LEFT Lumbar Four-Five Diskectomy  . NOSE SURGERY    . ROTATOR CUFF REPAIR  04/2010   right (but has bilat) Dr. Latanya [name] ([facility])    Family History  Problem Relation Age of Onset  . Cancer Father 91       colon  . Memory loss Mother     Social History:  reports that he quit smoking about 4 months ago. His smoking use included cigarettes. He started smoking about 68 years ago. He has a 60.00 pack-year smoking history. He has quit using smokeless tobacco.  His smokeless tobacco use included snuff. He reports previous alcohol use. He reports that he does not use drugs.  Allergies:  Allergies  Allergen Reactions  . Codeine     REACTION: vomiting  . Cyclobenzaprine Hcl     REACTION: vomiting  . Flexeril [Cyclobenzaprine]   . Hydrocodone Nausea Only and Other (See Comments)    "crazy"  . Hydrocodone   . Sulfa Antibiotics     Per mar  . Tramadol Other (See Comments)    woozy  . Tramadol   . Valium Other (See Comments)    sedation  .  Valium [Diazepam]     Medications: I have reviewed the patient's current medications.  Results for orders placed or performed during the hospital encounter of 01/28/2021 (from the past 48 hour(s))  CBC with Differential     Status: Abnormal   Collection Time: 01/15/2021  7:43 AM  Result Value Ref Range   WBC 8.5 4.0 - 10.5 K/uL   RBC 3.95 (L) 4.22 - 5.81 MIL/uL   Hemoglobin 11.8 (L) 13.0 - 17.0 g/dL   HCT 36.6 (L) 39.0 - 52.0 %   MCV 92.7 80.0 - 100.0 fL   MCH 29.9 26.0 - 34.0 pg   MCHC 32.2 30.0 - 36.0 g/dL   RDW 14.2 11.5 - 15.5 %   Platelets 133 (L) 150 - 400 K/uL   nRBC 0.0 0.0 - 0.2 %   Neutrophils  Relative % 57 %   Neutro Abs 5.0 1.7 - 7.7 K/uL   Lymphocytes Relative 27 %   Lymphs Abs 2.3 0.7 - 4.0 K/uL   Monocytes Relative 11 %   Monocytes Absolute 0.9 0.1 - 1.0 K/uL   Eosinophils Relative 2 %   Eosinophils Absolute 0.1 0.0 - 0.5 K/uL   Basophils Relative 1 %   Basophils Absolute 0.0 0.0 - 0.1 K/uL   Immature Granulocytes 2 %   Abs Immature Granulocytes 0.13 (H) 0.00 - 0.07 K/uL    Comment: Performed at [facility], [address] [place], [place] [address]  Urinalysis, Routine w reflex microscopic Urine, Catheterized     Status: Abnormal   Collection Time: 02/01/2021  8:52 AM  Result Value Ref Range   Color, Urine YELLOW YELLOW   APPearance CLEAR CLEAR   Specific Gravity, Urine 1.019 1.005 - 1.030   pH 6.0 5.0 - 8.0   Glucose, UA NEGATIVE NEGATIVE mg/dL   Hgb urine dipstick SMALL (A) NEGATIVE   Bilirubin Urine NEGATIVE NEGATIVE   Ketones, ur 5 (A) NEGATIVE mg/dL   Protein, ur 30 (A) NEGATIVE mg/dL   Nitrite NEGATIVE NEGATIVE   Leukocytes,Ua NEGATIVE NEGATIVE   RBC / HPF 0-5 0 - 5 RBC/hpf   WBC, UA 0-5 0 - 5 WBC/hpf   Bacteria, UA NONE SEEN NONE SEEN   Mucus PRESENT     Comment: Performed at [facility] [address] [place], [place] [address]  Protime-INR     Status: None   Collection Time: 02/03/2021  9:43 AM  Result Value Ref Range   Prothrombin Time 15.2 11.4 - 15.2 seconds   INR 1.2 0.8 - 1.2    Comment: (NOTE) INR goal varies based on device and disease states. Performed at [facility], [place] [address], [place], [place] [address]   APTT     Status: None   Collection Time: 01/27/2021  9:43 AM  Result Value Ref Range   aPTT 34 24 - 36 seconds    Comment: Performed at [facility] [address] [place], [place] [address]    CT Head Wo Contrast  Result Date: 01/24/2021 CLINICAL DATA:  Fall, head trauma EXAM: CT HEAD WITHOUT CONTRAST TECHNIQUE: Contiguous axial images were obtained from the base of the skull through the vertex  without intravenous contrast. COMPARISON:  Head CT from earlier today FINDINGS: Brain: Acute hyperdense right cerebral convexity subdural hematoma measuring up to 8 mm thickness with associated mild sulcal effacement, new since head CT from earlier today. Minimal 2 mm left midline shift. No evidence of parenchymal hemorrhage. No mass lesions.  No CT evidence of acute infarction. Nonspecific prominent subcortical and periventricular white matter hypodensity, most in keeping with chronic small vessel ischemic change. Generalized cerebral volume loss. Cerebral ventricle sizes are stable and concordant with the degree of cerebral volume loss. Vascular: No acute abnormality. Skull: No evidence of calvarial fracture. Sinuses/Orbits: The visualized paranasal sinuses are essentially clear. Other:  The mastoid air cells are unopacified. IMPRESSION: 1. Acute hyperdense right cerebral convexity subdural hematoma measuring up to 8 mm thickness with associated mild sulcal effacement and minimal 2 mm left midline shift. 2. Generalized cerebral volume loss and prominent chronic small vessel ischemic changes in the cerebral white matter. Critical Value/emergent results were called by telephone at the time of interpretation on 02/07/2021 at 8:58 am to provider [facility] , who verbally acknowledged these results. Electronically Signed   By: Ilona Sorrel [name]   On: 02/08/2021 09:01   CT Head Wo Contrast  Result Date: 02/04/2021 CLINICAL DATA:  nursing home- found on floor, staff had put blanket on patient with right elbow abrasion. Unwitnessed fall. CKD,CHF, Dementia, TDM2 EXAM: CT HEAD WITHOUT CONTRAST CT CERVICAL SPINE WITHOUT CONTRAST TECHNIQUE: Multidetector CT imaging of the head and cervical spine was performed following the standard protocol without intravenous contrast. Multiplanar CT image reconstructions of the cervical spine were also generated. COMPARISON:  CT head cervical spine 09/10/2020, CT head cervical spine  01/19/2021 FINDINGS: CT HEAD FINDINGS Brain: Cerebral ventricle sizes are concordant with the degree of cerebral volume loss. Patchy and confluent areas of decreased attenuation are noted throughout the deep and periventricular white matter of the cerebral hemispheres bilaterally, compatible with chronic microvascular ischemic disease. No evidence of large-territorial acute infarction. No parenchymal hemorrhage. No mass lesion. No extra-axial collection. No mass effect or midline shift. No hydrocephalus. Basilar cisterns are patent. Vascular: No hyperdense vessel. Atherosclerotic calcifications are present within the cavernous internal carotid arteries. Skull: No acute fracture or focal lesion. Sinuses/Orbits: Paranasal sinuses and mastoid air cells are clear. The orbits are unremarkable. Other: Redemonstration of mild right frontal scalp edema. CT CERVICAL SPINE FINDINGS Alignment: Normal. Skull base and vertebrae: Multilevel degenerative changes of the spine. No acute fracture. No aggressive appearing focal osseous lesion or focal pathologic process. Soft tissues and spinal canal: No prevertebral fluid or swelling. No visible canal hematoma. Visualized lungs: Biapical pleural/pulmonary scarring. Similar-appearing nodular subpleural opacity measuring 9 mm at the left apex. Emphysematous changes. Other: Asymmetric oropharynx with fullness on the right (5:53, 8:46) with finding likely related to tongue positioning given no visualization of similar finding on CT cervical spine 01/19/2021. IMPRESSION: 1.  No acute intracranial abnormality. 2. No acute displaced fracture or traumatic listhesis of the cervical spine. Electronically Signed   By: Iven Finn [name]   On: 01/20/2021 03:55   CT Head Wo Contrast  Result Date: 01/19/2021 CLINICAL DATA:  Fall. Head trauma. Patient on blood thinners. Coagulopathy. EXAM: CT HEAD WITHOUT CONTRAST CT CERVICAL SPINE WITHOUT CONTRAST TECHNIQUE: Multidetector CT imaging of the  head and cervical spine was performed following the standard protocol without intravenous contrast. Multiplanar CT image reconstructions of the cervical spine were also generated. COMPARISON:  CT of the head without contrast 01/16/2021 for the same indication in 09/10/2020 FINDINGS: CT HEAD FINDINGS Brain: Moderate atrophy and white matter disease is stable. Remote lacunar infarcts of the basal ganglia are stable. No acute infarct, hemorrhage, or mass lesion is present. The ventricles are proportionate to the degree of atrophy. No significant extraaxial fluid collection is present. The brainstem and  cerebellum are within normal limits. Vascular: Atherosclerotic calcifications are present in the cavernous internal carotid arteries and at the dural margin of both vertebral arteries. No significant hyperdense vessel is present. Skull: Minimal supraorbital frontal scalp soft tissue swelling is improved. No new soft tissue injuries are present. Calvarium is intact. Sinuses/Orbits: The paranasal sinuses and mastoid air cells are clear. The globes and orbits are within normal limits. CT CERVICAL SPINE FINDINGS Alignment: No significant listhesis or change is present. Skull base and vertebrae: A cervical junction is normal. Soft tissues and spinal canal: No prevertebral fluid or swelling. No visible canal hematoma. Atherosclerotic calcifications are present at the carotid bifurcations bilaterally without definite stenosis. Disc levels: Uncovertebral spurring and foraminal stenosis is greatest at C6-7 bilaterally and on the left at C5-6. Upper chest: Centrilobular emphysematous changes noted. Scarring at the right apex is stable. IMPRESSION: 1. No acute intracranial abnormality or significant interval change. 2. Stable atrophy and white matter disease. 3. Remote lacunar infarcts of the basal ganglia are stable. 4. Improved supraorbital frontal scalp soft tissue swelling. 5. No acute fracture or traumatic subluxation in the  cervical spine. 6. Stable multilevel degenerative changes of the cervical spine as described. 7. Emphysema (ICD10-J43.9). Electronically Signed   By: San Morelle [name]   On: 01/19/2021 17:29   CT Cervical Spine Wo Contrast  Result Date: 02-08-21 CLINICAL DATA:  nursing home- found on floor, staff had put blanket on patient with right elbow abrasion. Unwitnessed fall. CKD,CHF, Dementia, TDM2 EXAM: CT HEAD WITHOUT CONTRAST CT CERVICAL SPINE WITHOUT CONTRAST TECHNIQUE: Multidetector CT imaging of the head and cervical spine was performed following the standard protocol without intravenous contrast. Multiplanar CT image reconstructions of the cervical spine were also generated. COMPARISON:  CT head cervical spine 09/10/2020, CT head cervical spine 01/19/2021 FINDINGS: CT HEAD FINDINGS Brain: Cerebral ventricle sizes are concordant with the degree of cerebral volume loss. Patchy and confluent areas of decreased attenuation are noted throughout the deep and periventricular white matter of the cerebral hemispheres bilaterally, compatible with chronic microvascular ischemic disease. No evidence of large-territorial acute infarction. No parenchymal hemorrhage. No mass lesion. No extra-axial collection. No mass effect or midline shift. No hydrocephalus. Basilar cisterns are patent. Vascular: No hyperdense vessel. Atherosclerotic calcifications are present within the cavernous internal carotid arteries. Skull: No acute fracture or focal lesion. Sinuses/Orbits: Paranasal sinuses and mastoid air cells are clear. The orbits are unremarkable. Other: Redemonstration of mild right frontal scalp edema. CT CERVICAL SPINE FINDINGS Alignment: Normal. Skull base and vertebrae: Multilevel degenerative changes of the spine. No acute fracture. No aggressive appearing focal osseous lesion or focal pathologic process. Soft tissues and spinal canal: No prevertebral fluid or swelling. No visible canal hematoma. Visualized lungs:  Biapical pleural/pulmonary scarring. Similar-appearing nodular subpleural opacity measuring 9 mm at the left apex. Emphysematous changes. Other: Asymmetric oropharynx with fullness on the right (5:53, 8:46) with finding likely related to tongue positioning given no visualization of similar finding on CT cervical spine 01/19/2021. IMPRESSION: 1.  No acute intracranial abnormality. 2. No acute displaced fracture or traumatic listhesis of the cervical spine. Electronically Signed   By: Iven Finn [name]   On: 08-Feb-2021 03:55   CT Cervical Spine Wo Contrast  Result Date: 01/19/2021 CLINICAL DATA:  Fall. Head trauma. Patient on blood thinners. Coagulopathy. EXAM: CT HEAD WITHOUT CONTRAST CT CERVICAL SPINE WITHOUT CONTRAST TECHNIQUE: Multidetector CT imaging of the head and cervical spine was performed following the standard protocol without intravenous contrast. Multiplanar CT image reconstructions of  the cervical spine were also generated. COMPARISON:  CT of the head without contrast 01/16/2021 for the same indication in 09/10/2020 FINDINGS: CT HEAD FINDINGS Brain: Moderate atrophy and white matter disease is stable. Remote lacunar infarcts of the basal ganglia are stable. No acute infarct, hemorrhage, or mass lesion is present. The ventricles are proportionate to the degree of atrophy. No significant extraaxial fluid collection is present. The brainstem and cerebellum are within normal limits. Vascular: Atherosclerotic calcifications are present in the cavernous internal carotid arteries and at the dural margin of both vertebral arteries. No significant hyperdense vessel is present. Skull: Minimal supraorbital frontal scalp soft tissue swelling is improved. No new soft tissue injuries are present. Calvarium is intact. Sinuses/Orbits: The paranasal sinuses and mastoid air cells are clear. The globes and orbits are within normal limits. CT CERVICAL SPINE FINDINGS Alignment: No significant listhesis or change is  present. Skull base and vertebrae: A cervical junction is normal. Soft tissues and spinal canal: No prevertebral fluid or swelling. No visible canal hematoma. Atherosclerotic calcifications are present at the carotid bifurcations bilaterally without definite stenosis. Disc levels: Uncovertebral spurring and foraminal stenosis is greatest at C6-7 bilaterally and on the left at C5-6. Upper chest: Centrilobular emphysematous changes noted. Scarring at the right apex is stable. IMPRESSION: 1. No acute intracranial abnormality or significant interval change. 2. Stable atrophy and white matter disease. 3. Remote lacunar infarcts of the basal ganglia are stable. 4. Improved supraorbital frontal scalp soft tissue swelling. 5. No acute fracture or traumatic subluxation in the cervical spine. 6. Stable multilevel degenerative changes of the cervical spine as described. 7. Emphysema (ICD10-J43.9). Electronically Signed   By: San Morelle [name]   On: 01/19/2021 17:29   DG Chest Portable 1 View  Result Date: Feb 07, 2021 CLINICAL DATA:  87 year old male status post fall.  Found down. EXAM: PORTABLE CHEST 1 VIEW COMPARISON:  Portable chest.  01/19/2021 and earlier FINDINGS: Portable AP semi upright view at 0744 hours. Stable pulmonary hyperinflation. Prior CABG. Normal cardiac size and mediastinal contours. Visualized tracheal air column is within normal limits. Allowing for portable technique the lungs are clear. No pneumothorax or pleural effusion. No acute osseous abnormality identified. Negative visible bowel gas pattern. IMPRESSION: No acute cardiopulmonary abnormality or acute traumatic injury identified. Electronically Signed   By: Genevie Ann [name]   On: 02/07/2021 08:09   DG Chest Portable 1 View  Result Date: 01/19/2021 CLINICAL DATA:  Patient status post fall. EXAM: PORTABLE CHEST 1 VIEW COMPARISON:  Single-view of the chest 01/16/2021. CT chest 09/10/2020. FINDINGS: The lungs are emphysematous but clear. The  patient is status post CABG. Aortic atherosclerosis noted. No pneumothorax or pleural effusion. Heart size is normal. No acute or focal bony abnormality. IMPRESSION: No acute disease. Aortic Atherosclerosis (ICD10-I70.0) and Emphysema (ICD10-J43.9). Electronically Signed   By: Inge Rise [name]   On: 01/19/2021 11:47    Review of Systems  Constitutional: Negative.   HENT: Positive for facial swelling.   Respiratory: Negative.   Cardiovascular: Negative.   Gastrointestinal: Negative.   Endocrine: Negative.   Genitourinary: Negative.   Musculoskeletal: Positive for gait problem.  Skin: Negative.   Psychiatric/Behavioral: Positive for confusion.   Blood pressure 135/73, pulse 79, temperature 98.9 F (37.2 C), temperature source Oral, resp. rate 20, height 5\' 8"  (1.727 m), weight 71 kg, SpO2 97 %. Physical Exam Constitutional:      Comments: Dried blood on face, mouth  HENT:     Head: Normocephalic.     Nose:  Nose normal.     Mouth/Throat:     Comments: bloody Eyes:     Extraocular Movements: Extraocular movements intact.     Conjunctiva/sclera: Conjunctivae normal.     Pupils: Pupils are equal, round, and reactive to light.  Cardiovascular:     Rate and Rhythm: Normal rate.  Pulmonary:     Effort: Pulmonary effort is normal.     Breath sounds: Normal breath sounds.  Abdominal:     General: Abdomen is flat.  Musculoskeletal:        General: Normal range of motion.     Cervical back: Normal range of motion.  Skin:    General: Skin is warm and dry.  Neurological:     General: No focal deficit present.     Mental Status: He is alert and oriented to person, place, and time. Mental status is at baseline.     GCS: GCS eye subscore is 4. GCS verbal subscore is 5. GCS motor subscore is 6.     Cranial Nerves: No cranial nerve deficit.     Sensory: Sensation is intact.     Motor: Motor function is intact.     Comments: Gait not assessed  Psychiatric:        Mood and Affect:  Mood normal.     Assessment/Plan: Troy [name] is a 87 y.o. male With a small subdural hematoma on Eliquis. This is a non operative lesion. The Eliquis has been reversed, and discontinued. Will need another head CT tomorrow morning unless exam were to change before then. He is following commands, moving all extremities, and speaking. There is a clear cut exam which can be followed.   Ashok Pall 01/22/2021, 1:48 PM

## 2021-01-20 NOTE — ED Notes (Signed)
[facility] called for trasport 

## 2021-01-20 NOTE — Progress Notes (Signed)
Orthopedic Tech Progress Note Patient Details:  Troy [name] 1932/12/20 [id number] Level 2 trauma Patient ID: Troy [name], male   DOB: 23-Sep-1932, 87 y.o.   MRN: [id number]   Ellouise Newer 01/18/2021, 10:47 AM

## 2021-01-20 NOTE — Consult Note (Addendum)
Consultation Note Date: 01/17/2021   Patient Name: Troy [name]  DOB: 07/20/1933  MRN: [id number]  Age / Sex: 87 y.o., male  PCP: Troy Bush, MD Referring Physician: Karmen Bongo, MD  Reason for Consultation: Establishing goals of care  HPI/Patient Profile: 87 y.o. male  with past medical history of DM, SVT, COPD, dementia, CKD stage IIIb, atrial fibrillation on Eliquis, and CHF presented to [facility] ED on 01/19/2021 from [place] after an unwitnessed fall.  Patient was seen early this morning for evaluation after having an unwitnessed fall, was discharged from ED, and returned around 2 hours later after having another fall.  Per notes he has had 3 falls in the last 2 days -noted this is patient's 5th ED visit in 6 months.  Patient was admitted on 01/20/2021 with subdural hematoma.  Eliquis was reversed and stopped.  Neurosurgery was consulted -do not feel this is an operative lesion and plan for follow-up head CT unless any changes in condition.  Clinical Assessment and Goals of Care: I have reviewed medical records including EPIC notes, labs, and imaging. Received report from primary RN - no acute concerns.   Went to visit patient at bedside -son/HCPOA/Don present. Patient was lying in bed with eyes closed - he does respond to voice/gentle touch; however, his speech is mumbled and unintelligible.  He is not able to participate in conversation or make complex medical decisions.  He does show signs of non-verbal gestures of pain and discomfort. No respiratory distress or increased work of breathing; congested cough noted.  Met with son/HCPOA/Don to discuss diagnosis, prognosis, GOC, EOL wishes, disposition, and options.  I introduced Palliative Medicine as specialized medical care for people living with serious illness. It focuses on providing relief from the symptoms and stress of a serious illness. The goal  is to improve quality of life for both the patient and the family.  We discussed a brief life review of the patient as well as functional and nutritional status.  Troy [name] tells me Troy [name] retired from the [employer] after 21 years.  Patient's wife unfortunately passed away 4 years ago after 49 years of marriage -they had 2 children, 1 son and 1 daughter.  Prior to hospitalization, patient was living at [facility] which is an assisted living facility.  Patient has been there around 2 months.  Prior to [facility] patient was at [facility] for 2 months for rehab.  At [facility] patient was receiving PT twice a week and he was able to ambulate independently; however, often now forgets to use his walker, which is what Troy [name] attributes to the patient having multiple falls.  Reports the patient's appetite has been good states has been drinking well.  We discussed patient's current illness and what it means in the larger context of patient's on-going co-morbidities.  Do not have a clear understanding of the patient's acute medical situation.  Don understands that dementia, COPD, CKD, and CHF are progressive, non-curable disease underlying the patient's current acute medical conditions. Natural disease trajectory and  expectations at EOL were discussed. I attempted to elicit values and goals of care important to the patient. The difference between aggressive medical intervention and comfort care was considered in light of the patient's goals of care.  Troy [name] states he the patient has had several conversations in the past regarding his goals and wishes -patient was clear during his conversations that he would not want his life prolonged by artificial means or heroic measures.  Education provided the patient's dementia is likely progressing. We reviewed specific indicators of end stage dementia, including inability to communicate, bed bound/non-ambulatory status, decreased oral intake, swallowing  dysfunction,and incontinence of bowel/bladder.  Education provided that patient is at high risk of rehospitalization either due to progressing chronic illnesses and/or recurrent falls with possible injury.   Troy [name] explains that he has been in contact with someone named Financial controller at [facility].  Troy [name] explains they had spoken about a week ago and at that time was told it might be several weeks before full hospice evaluation could be completed.  They told him that if patient did not meet hospice criteria they could set him up with outpatient palliative care.  Troy [name] states he does not quite understand the difference between outpatient palliative care and hospice.  Educated on the difference between Palliative and Hospice care. Palliative care and hospice have similar goals of managing symptoms, promoting comfort, improving quality of life, and maintaining a person's dignity. However, palliative care may be offered during any phase of a serious illness, while hospice care is usually offered when a person is expected to live for 6 months or less.  Don expressed understanding and feels hospice care is likely most appropriate for patient at this time.  Offered to have our TOC team reach out to [facility] and follow-up to try and get evaluation for patient completed as soon as possible considering his acute decline - Troy [name] is agreeable.  Discussed with Troy [name] concern that patient will not be safe for return to ALF and may need skilled care at discharge 24/7 supervision.  Troy [name] states he and [place] with hospice have discussed if patient were to need additional care or 24/7 supervision/assistance that they would continue this discussion to support the patient and his needs. Troy [name] is hoping patient can remain in his current home.   We talked about transition to comfort measures in house and what that would entail inclusive of medications to control pain, dyspnea, agitation, nausea, itching, and hiccups. We discussed stopping all  unnecessary measures such as blood draws, needle sticks, oxygen, antibiotics, CBGs/insulin, cardiac monitoring, and frequent vital signs.  We discussed neurosurgery's recommendation for repeat head CT tomorrow morning in context of goals of care and whether information from CT would change goals.  After consideration, Troy [name] would like patient to receive follow-up CT in the morning but likely past that, does not see the need to pursue further testing/procedures.  Troy [name] would like patient to continue current gentle medical interventions without escalation of care.  If patient declines he is agreeable for transition to full comfort in house and of course would like to be notified.   Advance directives, concepts specific to code status, and rehospitalization were considered and discussed.  Troy [name] is patient's [facility] POA -document can be located under ACP tab.  Troy [name] also confirms patient's desire for DNR/DNI.  Discussed with Troy [name] the importance of continued conversation with family and the medical providers regarding overall plan of care and treatment options, ensuring decisions are within the context of the patient's  values and GOCs.    Questions and concerns were addressed. The patient/family was encouraged to call with questions and/or concerns. PMT card was provided.   Primary Decision Maker: HCPOA/son -Dolan Amen    SUMMARY OF RECOMMENDATIONS:  Continue gentle medical treatment without escalation of care  If patient declines, HCPOA/son is agreeable for transition to full comfort measures in house and request to be notified  Continue DNR/DNI as previously documented - durable DNR form completed and given to ED RN; copy was made and will be scanned to [employer]/ACP tab  Son is agreeable to proceed with follow-up head CT in the morning 5/8, but likely past that is not interested in further tests/procedures  Son has been in contact with Synthia Innocent [phone]) at [facility] - evaluation for hospice  care has not been completed yet  TOC consult placed for: assistance contacting Matt at [facility] for request to evaluate patient for hospice services as soon as possible.  If hospice services can be arranged before patient is discharged that would be recommended/preferable as patient is at high risk for injury and rehospitalization  Agree with morphine 2 mg IV every 2hr as needed for pain  PMT will continue to follow and support holistically  Code Status/Advance Care Planning:  DNR  Palliative Prophylaxis:   Aspiration, Bowel Regimen, Delirium Protocol, Frequent Pain Assessment, Oral Care and Turn Reposition  Additional Recommendations (Limitations, Scope, Preferences):  Full Scope Treatment, No Artificial Feeding, No Surgical Procedures and No Tracheostomy  Psycho-social/Spiritual:   Desire for further Chaplaincy support:no Created space and opportunity for patient and family to express thoughts and feelings regarding patient's current medical situation.   Emotional support and therapeutic listening provided.  Prognosis:   < 6 months  Discharge Planning: To Be Determined      Primary Diagnoses: Present on Admission: . SDH (subdural hematoma) (HCC) . Diabetes mellitus type 2 with complications (HCC) . Hyperlipidemia associated with type 2 diabetes mellitus (HCC) . COPD (chronic obstructive pulmonary disease) (HCC) . CAD (coronary artery disease) . Dementia without behavioral disturbance (HCC) . DNR (do not resuscitate)   I have reviewed the medical record, interviewed the patient and family, and examined the patient. The following aspects are pertinent.  Past Medical History:  Diagnosis Date  . Aorto-iliac atherosclerosis (HCC) 08/2015   by xray  . Barrett's esophagus    EGD 2012, rpt 2014, longterm PPI  . Chronic idiopathic constipation 07/17/2010  . Chronic obstructive pulmonary disease (COPD) (HCC)    spirometry with moderate COPD  . Coronary  atherosclerosis of artery bypass graft   . Diabetes mellitus type II ~2008  . Gastrointestinal ulcer 04/2011   with hematemesis s/p EGD by [facility]  . GERD (gastroesophageal reflux disease)   . Hearing loss   . HLD (hyperlipidemia)   . HNP (herniated nucleus pulposus), lumbar 2013   with compression of L5 nerve root and foot drop, also with lumbar DDD s/p surgery  . Hypertrophy of prostate without urinary obstruction and other lower urinary tract symptoms (LUTS)   . Memory loss   . Paroxysmal SVT (supraventricular tachycardia) (HCC)    a. 3/14 => converted in ED with adenosine to AFib => NSR;  b. Echo 4/14:  Mild LVH, mild FBSH, EF 55-60%, Gr 1 DD  . Prostate nodule ~2009   Left-benign s/p eval Uro WNL ([place])  . Thrombocytopenia (HCC)   . Tobacco use disorder    Social History   Socioeconomic History  . Marital status: Widowed  Spouse name: Not on file  . Number of children: 2  . Years of education: 93  . Highest education level: High school graduate  Occupational History  . Occupation: Retired from [employer]  Tobacco Use  . Smoking status: Former Smoker    Packs/day: 1.00    Years: 60.00    Pack years: 60.00    Types: Cigarettes    Start date: 09/16/1952    Quit date: 09/09/2020    Years since quitting: 0.3  . Smokeless tobacco: Former Systems developer    Types: Snuff  . Tobacco comment: < 1 ppd; does dip sometimes  Vaping Use  . Vaping Use: Never used  Substance and Sexual Activity  . Alcohol use: Not Currently  . Drug use: No  . Sexual activity: Not on file  Other Topics Concern  . Not on file  Social History Narrative   Widower - wife passed away early 12/15/16. Son lives nearby [place]), brings meals daily    Daughter lives in [place]   He is very active   He was in the [employer] for 4 years.    Activity: walking daily since back surgery    Diet: good water, not much fruits/vegetables    Right-handed.   Caffeine use: 2 cups coffee, some Pepsi   Social  Determinants of Health   Financial Resource Strain: Not on file  Food Insecurity: Not on file  Transportation Needs: Not on file  Physical Activity: Not on file  Stress: Not on file  Social Connections: Not on file   Family History  Problem Relation Age of Onset  . Cancer Father 25       colon  . Memory loss Mother    Scheduled Meds: . docusate sodium  100 mg Oral BID  . donepezil  10 mg Oral QHS  . [START ON 02/13/21] fluticasone furoate-vilanterol  1 puff Inhalation Daily  . insulin aspart  0-9 Units Subcutaneous TID WC  . [START ON 02-13-2021] isosorbide mononitrate  30 mg Oral Daily  . melatonin  5 mg Oral QHS  . memantine  10 mg Oral BID  . metoprolol tartrate  12.5 mg Oral BID  . [START ON 2021-02-13] pantoprazole  40 mg Oral Daily  . [START ON 02-13-21] sertraline  75 mg Oral Daily  . simvastatin  40 mg Oral QHS  . sodium chloride flush  3 mL Intravenous Q12H  . [START ON 13-Feb-2021] tamsulosin  0.4 mg Oral Daily  . valproic acid  250 mg Oral QID   Continuous Infusions: . lactated ringers 75 mL/hr at 01/19/2021 1108   PRN Meds:.acetaminophen **OR** acetaminophen, albuterol, bisacodyl, haloperidol lactate, hydrALAZINE, morphine injection, nicotine, ondansetron **OR** ondansetron (ZOFRAN) IV, polyethylene glycol Medications Prior to Admission:  Prior to Admission medications   Medication Sig Start Date End Date Taking? Authorizing Provider  albuterol (PROVENTIL) (2.5 MG/3ML) 0.083% nebulizer solution Take 2.5 mg by nebulization every 6 (six) hours as needed for wheezing.   Yes [provider]  BREO ELLIPTA 100-25 MCG/INH AEPB Inhale 1 puff into the lungs daily. 12/14/20  Yes [provider]  donepezil (ARICEPT) 10 MG tablet Take 10 mg by mouth at bedtime. 12/25/20  Yes [provider]  isosorbide mononitrate (IMDUR) 30 MG 24 hr tablet Take 30 mg by mouth daily. 01/15/21  Yes [provider]  melatonin 5 MG TABS Take 5 mg by mouth at bedtime. For  Insomnia   Yes [provider]  memantine (NAMENDA) 10 MG tablet Take 10 mg  by mouth 2 (two) times daily. 12/24/20  Yes [provider]  metFORMIN (GLUCOPHAGE) 500 MG tablet Take 500 mg by mouth daily. 12/21/20  Yes [provider]  metoprolol tartrate (LOPRESSOR) 25 MG tablet Take 12.5 mg by mouth in the morning and at bedtime. 12/24/20  Yes [provider]  nicotine (NICODERM CQ - DOSED IN MG/24 HOURS) 14 mg/24hr patch Place 14 mg onto the skin daily as needed (tobacco addiction). 01/15/21  Yes [provider]  omeprazole (PRILOSEC) 40 MG capsule Take 40 mg by mouth daily. 12/27/20  Yes [provider]  sertraline (ZOLOFT) 50 MG tablet Take 75 mg by mouth daily.   Yes [provider]  simvastatin (ZOCOR) 40 MG tablet Take 40 mg by mouth at bedtime. 12/24/20  Yes [provider]  tamsulosin (FLOMAX) 0.4 MG CAPS capsule Take 1 capsule (0.4 mg total) by mouth daily. 09/20/20  Yes Thurnell Lose, MD  valproic acid (DEPAKENE) 250 MG/5ML solution Take 250 mg by mouth 4 (four) times daily. 01/06/21  Yes [provider]  vitamin B-12 (CYANOCOBALAMIN) 500 MCG tablet Take 500 mcg by mouth See admin instructions. Monday,Wednesday and friday   Yes [provider]   Allergies  Allergen Reactions  . Codeine     REACTION: vomiting  . Cyclobenzaprine Hcl     REACTION: vomiting  . Flexeril [Cyclobenzaprine]   . Hydrocodone Nausea Only and Other (See Comments)    "crazy"  . Hydrocodone   . Sulfa Antibiotics     Per mar  . Tramadol Other (See Comments)    woozy  . Tramadol   . Valium Other (See Comments)    sedation  . Valium [Diazepam]    Review of Systems  Unable to perform ROS: Acuity of condition    Physical Exam Vitals and nursing note reviewed.  Constitutional:      General: He is not in acute distress.    Appearance: He is ill-appearing.  Pulmonary:     Effort: No respiratory distress.  Skin:     General: Skin is warm and dry.     Findings: Abrasion and signs of injury present.  Neurological:     Mental Status: He is lethargic, disoriented and confused.     Motor: Weakness present.  Psychiatric:        Speech: Speech is slurred.        Behavior: Behavior is withdrawn.        Cognition and Memory: Cognition is impaired. Memory is impaired.     Vital Signs: BP 135/73   Pulse 79   Temp 98.9 F (37.2 C) (Oral)   Resp 20   Ht $R'5\' 8"'MX$  (1.727 m)   Wt 71 kg   SpO2 97%   BMI 23.80 kg/m  Pain Scale: 0-10   Pain Score: 3    SpO2: SpO2: 97 % O2 Device:SpO2: 97 % O2 Flow Rate: .   IO: Intake/output summary: No intake or output data in the 24 hours ending 01/18/2021 1403  LBM:   Baseline Weight: Weight: 71 kg Most recent weight: Weight: 71 kg     Palliative Assessment/Data: PPS 20%     Time In: 1630 Time Out: 1747 Time Total: 77 minutes  Greater than 50%  of this time was spent counseling and coordinating care related to the above assessment and plan.  Signed by: Lin Landsman, NP   Please contact Palliative Medicine Team phone at [phone] for questions and concerns.  For individual provider:  See Amion  *Portions of this note are a verbal dictation therefore any spelling and/or grammatical errors are due to the "[employer] One" system interpretation.

## 2021-01-20 NOTE — ED Notes (Signed)
PTAR CALLED  °

## 2021-01-20 NOTE — ED Provider Notes (Signed)
[facility] EMERGENCY DEPARTMENT Provider Note   CSN: [id number] Arrival date & time: 01/18/2021  0117     History Chief Complaint  Patient presents with  . Fall    +thinners    Troy [name] is a 87 y.o. male.  Found down, assumed that he fell. No new injuries that area apparent. Has had multiple falls recently (including yesterday).   The history is provided by the patient.  Fall This is a recurrent problem. The current episode started 1 to 2 hours ago. The problem occurs constantly.       Past Medical History:  Diagnosis Date  . Aorto-iliac atherosclerosis ([place]) 08/2015   by xray  . Barrett's esophagus    EGD 2012, rpt 2014, longterm PPI  . Chronic idiopathic constipation 07/17/2010  . Chronic obstructive pulmonary disease (COPD) (HCC)    spirometry with moderate COPD  . Coronary atherosclerosis of artery bypass graft   . Diabetes mellitus type II ~2008  . Gastrointestinal ulcer 04/2011   with hematemesis s/p EGD by [facility]  . GERD (gastroesophageal reflux disease)   . Hearing loss   . HLD (hyperlipidemia)   . HNP (herniated nucleus pulposus), lumbar 2013   with compression of L5 nerve root and foot drop, also with lumbar DDD s/p surgery  . Hypertrophy of prostate without urinary obstruction and other lower urinary tract symptoms (LUTS)   . Memory loss   . Paroxysmal SVT (supraventricular tachycardia) ([place])    a. 3/14 => converted in ED with adenosine to AFib => NSR;  b. Echo 4/14:  Mild LVH, mild FBSH, EF 55-60%, Gr 1 DD  . Prostate nodule ~2009   Left-benign s/p eval Uro WNL ([place])  . Thrombocytopenia ([place])   . Tobacco use disorder     Patient Active Problem List   Diagnosis Date Noted  . Dermatitis 12/06/2020  . DNR (do not resuscitate) 11/28/2020  . CKD (chronic kidney disease), stage III ([place]) 10/17/2020  . Unspecified protein-calorie malnutrition ([place]) 10/17/2020  . Atrial fibrillation with RVR ([place]) 09/21/2020  . Dysphagia  09/21/2020  . Severe hypoxemia 09/10/2020  . Acute respiratory disease due to COVID-19 virus 09/10/2020  . Acute respiratory failure with hypoxemia ([place]) 09/10/2020  . AKI (acute kidney injury) ([place]) 09/10/2020  . Chronic diastolic CHF (congestive heart failure) ([place]) 09/10/2020  . Cerebral vascular disease 04/10/2020  . Diabetic cataract of both eyes ([place]) 01/24/2020  . Vitamin B12 deficiency 08/03/2019  . History of stroke 08/03/2019  . Pseudodementia 10/01/2018  . Sensorineural hearing loss (SNHL) of both ears 12/31/2017  . [place] intertrigo 12/22/2017  . Left serous otitis media 07/04/2017  . Hypertension associated with diabetes ([place]) 05/22/2017  . Eustachian tube dysfunction, left 04/04/2017  . Dry skin dermatitis 12/02/2016  . Health maintenance examination 10/08/2016  . Advanced care planning/counseling discussion 10/08/2016  . Thyromegaly 10/08/2016  . Anemia 10/08/2016  . Aorto-iliac atherosclerosis ([place]) 08/17/2015  . Bleeding hemorrhoid 06/13/2015  . Dementia without behavioral disturbance ([place]) 06/13/2015  . Orthostatic dizziness 08/01/2014  . Paresthesia of foot, bilateral 08/01/2014  . Supraventricular tachycardia ([place]) 11/24/2012  . CAD (coronary artery disease) 11/24/2012  . COPD exacerbation ([place]) 11/10/2012  . Lumbosacral radiculopathy at L5 12/23/2011  . Thrombocytopenia ([place])   . Barrett's esophagus   . Skin lesion of back 09/16/2011  . PUD (peptic ulcer disease) 05/16/2011  . Medicare annual wellness visit, subsequent 04/30/2011  . COPD (chronic obstructive pulmonary disease) ([place]) 12/26/2010  . Diabetes mellitus type 2 with  complications ([place]) [id number]  . Chronic idiopathic constipation 07/17/2010  . Hyperlipidemia associated with type 2 diabetes mellitus ([place]) 05/13/2009  . Tobacco use 05/13/2009    Past Surgical History:  Procedure Laterality Date  . COLONOSCOPY  03/2009   small hemorrhoids, 2 polyps (hyperplastic and adenomatous), rpt 5 yrs  (Dr. Barron Schmid) - pt declined rpt scheduling  . COLONOSCOPY  10/2014   2 hyperplastic polyp, hemmorhoids ([place])  . CORONARY ARTERY BYPASS GRAFT  1999   Dr. Roxy Manns  . ESOPHAGOGASTRODUODENOSCOPY  05/16/2011   after hematemesis, no lesions found, consistent with barrett's esophagus rpt 2 yrs  . ESOPHAGOGASTRODUODENOSCOPY  06/2013   barrett's, small HH, recheck 2-3 yrs ([place])  . HEMORRHOID SURGERY    . LUMBAR LAMINECTOMY/DECOMPRESSION MICRODISCECTOMY  01/31/2012   Procedure: LUMBAR LAMINECTOMY/DECOMPRESSION MICRODISCECTOMY 1 LEVEL;  Surgeon: Winfield Cunas, MD;  Location: [facility];  Service: Neurosurgery;  Laterality: Left;  LEFT Lumbar Four-Five Diskectomy  . NOSE SURGERY    . ROTATOR CUFF REPAIR  04/2010   right (but has bilat) Dr. Latanya Maudlin ([facility])       Family History  Problem Relation Age of Onset  . Cancer Father 42       colon  . Memory loss Mother     Social History   Tobacco Use  . Smoking status: Current Every Day Smoker    Packs/day: 1.00    Years: 60.00    Pack years: 60.00    Types: Cigarettes    Start date: 09/16/1952  . Smokeless tobacco: Current User    Types: Snuff  . Tobacco comment: < 1 ppd; does dip sometimes  Vaping Use  . Vaping Use: Never used  Substance Use Topics  . Alcohol use: Yes  . Drug use: No    Home Medications Prior to Admission medications   Medication Sig Start Date End Date Taking? Authorizing Provider  albuterol (VENTOLIN HFA) 108 (90 Base) MCG/ACT inhaler Inhale 2 puffs into the lungs every 6 (six) hours as needed for wheezing. 12/13/20   [provider]  apixaban (ELIQUIS) 2.5 MG TABS tablet Take 1 tablet (2.5 mg total) by mouth 2 (two) times daily. 09/19/20   Thurnell Lose, MD  BREO ELLIPTA 100-25 MCG/INH AEPB Inhale 1 puff into the lungs daily. 12/14/20   [provider]  donepezil (ARICEPT) 10 MG tablet Take 1 tablet (10 mg total) by mouth at bedtime. 04/10/20   Marcial Pacas, MD  donepezil (ARICEPT) 10 MG  tablet Take 10 mg by mouth at bedtime. 12/25/20   [provider]  ELIQUIS 2.5 MG TABS tablet Take 2.5 mg by mouth 2 (two) times daily. 12/21/20   [provider]  fluticasone furoate-vilanterol (BREO ELLIPTA) 100-25 MCG/INH AEPB Inhale 1 puff into the lungs daily. For COPD    [provider]  hydrocortisone butyrate (LUCOID) 0.1 % CREA cream Apply 1 application topically 2 (two) times daily. Apply to hands, chest and back    [provider]  HYDROCORTISONE, TOPICAL, 1 % SOLN Apply 1 application topically See admin instructions. Apply to hands,chest and back  twice daily for rash    [provider]  isosorbide mononitrate (IMDUR) 30 MG 24 hr tablet TAKE 1 TABLET BY MOUTH  DAILY Patient taking differently: Take 30 mg by mouth daily. 12/13/20   [name], MD  isosorbide mononitrate (IMDUR) 30 MG 24 hr tablet Take 30 mg by mouth daily. 01/15/21   [provider]  melatonin 5 MG TABS Take 5  mg by mouth at bedtime. For Insomnia    [provider]  melatonin 5 MG TABS Take 5 mg by mouth at bedtime.    [provider]  memantine (NAMENDA) 10 MG tablet Take 1 tablet (10 mg total) by mouth 2 (two) times daily. 04/10/20   Marcial Pacas, MD  memantine (NAMENDA) 10 MG tablet Take 10 mg by mouth 2 (two) times daily. 12/24/20   [provider]  metFORMIN (GLUCOPHAGE) 500 MG tablet Take 1 tablet (500 mg total) by mouth daily with breakfast. 01/24/20   [name], MD  metFORMIN (GLUCOPHAGE) 500 MG tablet Take 500 mg by mouth daily. 12/21/20   [provider]  metoprolol tartrate (LOPRESSOR) 25 MG tablet TAKE ONE-HALF TABLET BY  MOUTH TWICE DAILY Patient taking differently: Take 12.5 mg by mouth 2 (two) times daily. 11/21/20   [name], MD  metoprolol tartrate (LOPRESSOR) 25 MG tablet Take 12.5 mg by mouth in the morning and at bedtime. 12/24/20   [provider]  nicotine (NICODERM CQ - DOSED IN MG/24 HOURS) 14  mg/24hr patch Place 14 mg onto the skin daily as needed (tobacco addiction). 01/15/21   [provider]  nicotine (NICODERM CQ - DOSED IN MG/24 HOURS) 21 mg/24hr patch Place 21 mg onto the skin daily.    [provider]  nicotine (NICODERM CQ - DOSED IN MG/24 HOURS) 21 mg/24hr patch Place 21 mg onto the skin daily. Patient not taking: No sig reported    [provider]  omeprazole (PRILOSEC) 40 MG capsule TAKE 1 CAPSULE BY MOUTH  DAILY Patient taking differently: Take 40 mg by mouth daily. 11/21/20   [name], MD  omeprazole (PRILOSEC) 40 MG capsule Take 40 mg by mouth daily. 12/27/20   [provider]  OXYGEN Inhale 2 L into the lungs daily as needed (Shortness of breath).    [provider]  OXYGEN Inhale 2 L into the lungs daily as needed (shortness of breath).    [provider]  sertraline (ZOLOFT) 50 MG tablet Take 75 mg by mouth daily.    [provider]  sertraline (ZOLOFT) 50 MG tablet Take 75 mg by mouth daily. 12/21/20   [provider]  simvastatin (ZOCOR) 40 MG tablet TAKE 1 TABLET BY MOUTH AT  BEDTIME Patient taking differently: Take 40 mg by mouth daily at 6 PM. 11/06/20   [name], MD  simvastatin (ZOCOR) 40 MG tablet Take 40 mg by mouth at bedtime. 12/24/20   [provider]  tamsulosin (FLOMAX) 0.4 MG CAPS capsule Take 1 capsule (0.4 mg total) by mouth daily. 09/20/20   Thurnell Lose, MD  tamsulosin (FLOMAX) 0.4 MG CAPS capsule Take 0.4 mg by mouth daily. 12/24/20   [provider]  valproic acid (DEPAKENE) 250 MG/5ML solution Take 250 mg by mouth 4 (four) times daily.    [provider]  valproic acid (DEPAKENE) 250 MG/5ML solution Take 250 mg by mouth 4 (four) times daily. 01/06/21   [provider]  vitamin B-12 (CYANOCOBALAMIN) 500 MCG tablet Take 1 tablet (500 mcg total) by mouth every Monday, Wednesday, and Friday. 01/28/20   [name], MD  vitamin  B-12 (CYANOCOBALAMIN) 500 MCG tablet Take 500 mcg by mouth See admin instructions. Monday,Wednesday and friday    [provider]    Allergies    Codeine, Cyclobenzaprine hcl, Flexeril [cyclobenzaprine], Hydrocodone, Hydrocodone, Sulfa antibiotics, Tramadol, Tramadol, Valium, and Valium [diazepam]  Review of Systems   Review of  Systems  Unable to perform ROS: Dementia  All other systems reviewed and are negative.   Physical Exam Updated Vital Signs BP (!) 142/73   Pulse 72   Temp (!) 97.4 F (36.3 C) (Oral)   Resp 17   SpO2 94%   Physical Exam Vitals and nursing note reviewed.  Constitutional:      Appearance: He is well-developed.  HENT:     Head: Normocephalic.     Comments: Old abrasion to forehead    Mouth/Throat:     Mouth: Mucous membranes are moist.     Pharynx: Oropharynx is clear.  Cardiovascular:     Rate and Rhythm: Normal rate.  Pulmonary:     Effort: Pulmonary effort is normal. No respiratory distress.  Abdominal:     General: There is no distension.  Musculoskeletal:        General: Normal range of motion.     Cervical back: Normal range of motion.  Skin:    Findings: Erythema (mild to chest and abdominal wall) present.  Neurological:     Mental Status: He is alert. Mental status is at baseline.     ED Results / Procedures / Treatments   Labs (all labs ordered are listed, but only abnormal results are displayed) Labs Reviewed - No data to display  EKG None  Radiology CT Head Wo Contrast  Result Date: 01/19/2021 CLINICAL DATA:  Fall. Head trauma. Patient on blood thinners. Coagulopathy. EXAM: CT HEAD WITHOUT CONTRAST CT CERVICAL SPINE WITHOUT CONTRAST TECHNIQUE: Multidetector CT imaging of the head and cervical spine was performed following the standard protocol without intravenous contrast. Multiplanar CT image reconstructions of the cervical spine were also generated. COMPARISON:  CT of the head without contrast 01/16/2021 for the same  indication in 09/10/2020 FINDINGS: CT HEAD FINDINGS Brain: Moderate atrophy and white matter disease is stable. Remote lacunar infarcts of the basal ganglia are stable. No acute infarct, hemorrhage, or mass lesion is present. The ventricles are proportionate to the degree of atrophy. No significant extraaxial fluid collection is present. The brainstem and cerebellum are within normal limits. Vascular: Atherosclerotic calcifications are present in the cavernous internal carotid arteries and at the dural margin of both vertebral arteries. No significant hyperdense vessel is present. Skull: Minimal supraorbital frontal scalp soft tissue swelling is improved. No new soft tissue injuries are present. Calvarium is intact. Sinuses/Orbits: The paranasal sinuses and mastoid air cells are clear. The globes and orbits are within normal limits. CT CERVICAL SPINE FINDINGS Alignment: No significant listhesis or change is present. Skull base and vertebrae: A cervical junction is normal. Soft tissues and spinal canal: No prevertebral fluid or swelling. No visible canal hematoma. Atherosclerotic calcifications are present at the carotid bifurcations bilaterally without definite stenosis. Disc levels: Uncovertebral spurring and foraminal stenosis is greatest at C6-7 bilaterally and on the left at C5-6. Upper chest: Centrilobular emphysematous changes noted. Scarring at the right apex is stable. IMPRESSION: 1. No acute intracranial abnormality or significant interval change. 2. Stable atrophy and white matter disease. 3. Remote lacunar infarcts of the basal ganglia are stable. 4. Improved supraorbital frontal scalp soft tissue swelling. 5. No acute fracture or traumatic subluxation in the cervical spine. 6. Stable multilevel degenerative changes of the cervical spine as described. 7. Emphysema (ICD10-J43.9). Electronically Signed   By: San Morelle [name]   On: 01/19/2021 17:29   CT Cervical Spine Wo Contrast  Result Date:  01/19/2021 CLINICAL DATA:  Fall. Head trauma. Patient on blood thinners. Coagulopathy. EXAM:  CT HEAD WITHOUT CONTRAST CT CERVICAL SPINE WITHOUT CONTRAST TECHNIQUE: Multidetector CT imaging of the head and cervical spine was performed following the standard protocol without intravenous contrast. Multiplanar CT image reconstructions of the cervical spine were also generated. COMPARISON:  CT of the head without contrast 01/16/2021 for the same indication in 09/10/2020 FINDINGS: CT HEAD FINDINGS Brain: Moderate atrophy and white matter disease is stable. Remote lacunar infarcts of the basal ganglia are stable. No acute infarct, hemorrhage, or mass lesion is present. The ventricles are proportionate to the degree of atrophy. No significant extraaxial fluid collection is present. The brainstem and cerebellum are within normal limits. Vascular: Atherosclerotic calcifications are present in the cavernous internal carotid arteries and at the dural margin of both vertebral arteries. No significant hyperdense vessel is present. Skull: Minimal supraorbital frontal scalp soft tissue swelling is improved. No new soft tissue injuries are present. Calvarium is intact. Sinuses/Orbits: The paranasal sinuses and mastoid air cells are clear. The globes and orbits are within normal limits. CT CERVICAL SPINE FINDINGS Alignment: No significant listhesis or change is present. Skull base and vertebrae: A cervical junction is normal. Soft tissues and spinal canal: No prevertebral fluid or swelling. No visible canal hematoma. Atherosclerotic calcifications are present at the carotid bifurcations bilaterally without definite stenosis. Disc levels: Uncovertebral spurring and foraminal stenosis is greatest at C6-7 bilaterally and on the left at C5-6. Upper chest: Centrilobular emphysematous changes noted. Scarring at the right apex is stable. IMPRESSION: 1. No acute intracranial abnormality or significant interval change. 2. Stable atrophy and  white matter disease. 3. Remote lacunar infarcts of the basal ganglia are stable. 4. Improved supraorbital frontal scalp soft tissue swelling. 5. No acute fracture or traumatic subluxation in the cervical spine. 6. Stable multilevel degenerative changes of the cervical spine as described. 7. Emphysema (ICD10-J43.9). Electronically Signed   By: San Morelle [name]   On: 01/19/2021 17:29   DG Chest Portable 1 View  Result Date: 01/19/2021 CLINICAL DATA:  Patient status post fall. EXAM: PORTABLE CHEST 1 VIEW COMPARISON:  Single-view of the chest 01/16/2021. CT chest 09/10/2020. FINDINGS: The lungs are emphysematous but clear. The patient is status post CABG. Aortic atherosclerosis noted. No pneumothorax or pleural effusion. Heart size is normal. No acute or focal bony abnormality. IMPRESSION: No acute disease. Aortic Atherosclerosis (ICD10-I70.0) and Emphysema (ICD10-J43.9). Electronically Signed   By: Inge Rise [name]   On: 01/19/2021 11:47    Procedures Procedures   Medications Ordered in ED Medications - No data to display  ED Course  I have reviewed the triage vital signs and the nursing notes.  Pertinent labs & imaging results that were available during my care of the patient were reviewed by me and considered in my medical decision making (see chart for details).    MDM Rules/Calculators/A&P                          No obvious new traumatic injuries. Needs PCP follow up to assess reasoning for increased falls recently. Possibly moved to another facility?  Final Clinical Impression(s) / ED Diagnoses Final diagnoses:  Fall, initial encounter    Rx / DC Orders ED Discharge Orders    None       Laurianne Floresca, Corene Cornea, MD 02-04-2021 [phone]

## 2021-01-20 NOTE — ED Notes (Signed)
Patient transported to CT 

## 2021-01-20 NOTE — ED Provider Notes (Signed)
[place] EMERGENCY DEPARTMENT Provider Note   CSN: [id number] Arrival date & time: 01/28/2021  [id number]     History Chief Complaint  Patient presents with  . [place] is a 87 y.o. male.  HPI Patient is an 87 year old male with a history of COPD, CAD, diabetes mellitus, memory loss, who presents the emergency department due to a fall.  Patient came to the emergency department via EMS from [facility]  Patient's had multiple falls.  He was just seen earlier this morning for a fall.  Patient is on Eliquis.  Patient has significant dementia.  He does not have any immediate complaints.  Unwitnessed fall.  Patient has blood on his lips.  He does not know why he is at the hospital.    Past Medical History:  Diagnosis Date  . Aorto-iliac atherosclerosis ([place]) 08/2015   by xray  . Barrett's esophagus    EGD 2012, rpt 2014, longterm PPI  . Chronic idiopathic constipation 07/17/2010  . Chronic obstructive pulmonary disease (COPD) (HCC)    spirometry with moderate COPD  . Coronary atherosclerosis of artery bypass graft   . Diabetes mellitus type II ~2008  . Gastrointestinal ulcer 04/2011   with hematemesis s/p EGD by [facility]  . GERD (gastroesophageal reflux disease)   . Hearing loss   . HLD (hyperlipidemia)   . HNP (herniated nucleus pulposus), lumbar 2013   with compression of L5 nerve root and foot drop, also with lumbar DDD s/p surgery  . Hypertrophy of prostate without urinary obstruction and other lower urinary tract symptoms (LUTS)   . Memory loss   . Paroxysmal SVT (supraventricular tachycardia) ([place])    a. 3/14 => converted in ED with adenosine to AFib => NSR;  b. Echo 4/14:  Mild LVH, mild FBSH, EF 55-60%, Gr 1 DD  . Prostate nodule ~2009   Left-benign s/p eval Uro WNL ([place])  . Thrombocytopenia ([place])   . Tobacco use disorder     Patient Active Problem List   Diagnosis Date Noted  . SDH (subdural hematoma) ([place]) 01/17/2021  .  Dermatitis 12/06/2020  . DNR (do not resuscitate) 11/28/2020  . CKD (chronic kidney disease), stage III ([place]) 10/17/2020  . Unspecified protein-calorie malnutrition ([place]) 10/17/2020  . Atrial fibrillation with RVR ([place]) 09/21/2020  . Dysphagia 09/21/2020  . Severe hypoxemia 09/10/2020  . Acute respiratory disease due to COVID-19 virus 09/10/2020  . Acute respiratory failure with hypoxemia ([place]) 09/10/2020  . AKI (acute kidney injury) ([place]) 09/10/2020  . Chronic diastolic CHF (congestive heart failure) ([place]) 09/10/2020  . Cerebral vascular disease 04/10/2020  . Diabetic cataract of both eyes ([place]) 01/24/2020  . Vitamin B12 deficiency 08/03/2019  . History of stroke 08/03/2019  . Pseudodementia 10/01/2018  . Sensorineural hearing loss (SNHL) of both ears 12/31/2017  . [place] intertrigo 12/22/2017  . Left serous otitis media 07/04/2017  . Hypertension associated with diabetes ([place]) 05/22/2017  . Eustachian tube dysfunction, left 04/04/2017  . Dry skin dermatitis 12/02/2016  . Health maintenance examination 10/08/2016  . Advanced care planning/counseling discussion 10/08/2016  . Thyromegaly 10/08/2016  . Anemia 10/08/2016  . Aorto-iliac atherosclerosis ([place]) 08/17/2015  . Bleeding hemorrhoid 06/13/2015  . Dementia without behavioral disturbance ([place]) 06/13/2015  . Orthostatic dizziness 08/01/2014  . Paresthesia of foot, bilateral 08/01/2014  . Supraventricular tachycardia ([place]) 11/24/2012  . CAD (coronary artery disease) 11/24/2012  . COPD exacerbation ([place]) 11/10/2012  . Lumbosacral radiculopathy at L5 12/23/2011  . Thrombocytopenia (  [place])   . Barrett's esophagus   . Skin lesion of back 09/16/2011  . PUD (peptic ulcer disease) 05/16/2011  . Medicare annual wellness visit, subsequent 04/30/2011  . COPD (chronic obstructive pulmonary disease) ([place]) 12/26/2010  . Diabetes mellitus type 2 with complications ([place]) 40/06/2724  . Chronic idiopathic constipation 07/17/2010  .  Hyperlipidemia associated with type 2 diabetes mellitus ([place]) 05/13/2009  . Tobacco use 05/13/2009    Past Surgical History:  Procedure Laterality Date  . COLONOSCOPY  03/2009   small hemorrhoids, 2 polyps (hyperplastic and adenomatous), rpt 5 yrs (Dr. Barron Schmid) - pt declined rpt scheduling  . COLONOSCOPY  10/2014   2 hyperplastic polyp, hemmorhoids ([place])  . CORONARY ARTERY BYPASS GRAFT  1999   Dr. Roxy Manns  . ESOPHAGOGASTRODUODENOSCOPY  05/16/2011   after hematemesis, no lesions found, consistent with barrett's esophagus rpt 2 yrs  . ESOPHAGOGASTRODUODENOSCOPY  06/2013   barrett's, small HH, recheck 2-3 yrs ([place])  . HEMORRHOID SURGERY    . LUMBAR LAMINECTOMY/DECOMPRESSION MICRODISCECTOMY  01/31/2012   Procedure: LUMBAR LAMINECTOMY/DECOMPRESSION MICRODISCECTOMY 1 LEVEL;  Surgeon: Winfield Cunas, MD;  Location: [place] [facility];  Service: Neurosurgery;  Laterality: Left;  LEFT Lumbar Four-Five Diskectomy  . NOSE SURGERY    . ROTATOR CUFF REPAIR  04/2010   right (but has bilat) Dr. Latanya Maudlin ([facility])       Family History  Problem Relation Age of Onset  . Cancer Father 50       colon  . Memory loss Mother     Social History   Tobacco Use  . Smoking status: Former Smoker    Packs/day: 1.00    Years: 60.00    Pack years: 60.00    Types: Cigarettes    Start date: 09/16/1952    Quit date: 09/09/2020    Years since quitting: 0.3  . Smokeless tobacco: Former Systems developer    Types: Snuff  . Tobacco comment: < 1 ppd; does dip sometimes  Vaping Use  . Vaping Use: Never used  Substance Use Topics  . Alcohol use: Not Currently  . Drug use: No    Home Medications Prior to Admission medications   Medication Sig Start Date End Date Taking? Authorizing Provider  albuterol (PROVENTIL) (2.5 MG/3ML) 0.083% nebulizer solution Take 2.5 mg by nebulization every 6 (six) hours as needed for wheezing.   Yes [provider]  BREO ELLIPTA 100-25 MCG/INH AEPB Inhale 1 puff into the lungs  daily. 12/14/20  Yes [provider]  donepezil (ARICEPT) 10 MG tablet Take 10 mg by mouth at bedtime. 12/25/20  Yes [provider]  isosorbide mononitrate (IMDUR) 30 MG 24 hr tablet Take 30 mg by mouth daily. 01/15/21  Yes [provider]  melatonin 5 MG TABS Take 5 mg by mouth at bedtime. For Insomnia   Yes [provider]  memantine (NAMENDA) 10 MG tablet Take 10 mg by mouth 2 (two) times daily. 12/24/20  Yes [provider]  metFORMIN (GLUCOPHAGE) 500 MG tablet Take 500 mg by mouth daily. 12/21/20  Yes [provider]  metoprolol tartrate (LOPRESSOR) 25 MG tablet Take 12.5 mg by mouth in the morning and at bedtime. 12/24/20  Yes [provider]  nicotine (NICODERM CQ - DOSED IN MG/24 HOURS) 14 mg/24hr patch Place 14 mg onto the skin daily as needed (tobacco addiction). 01/15/21  Yes [provider]  omeprazole (PRILOSEC) 40 MG capsule Take 40 mg by mouth daily. 12/27/20  Yes [provider]  sertraline (  ZOLOFT) 50 MG tablet Take 75 mg by mouth daily.   Yes [provider]  simvastatin (ZOCOR) 40 MG tablet Take 40 mg by mouth at bedtime. 12/24/20  Yes [provider]  tamsulosin (FLOMAX) 0.4 MG CAPS capsule Take 1 capsule (0.4 mg total) by mouth daily. 09/20/20  Yes Thurnell Lose, MD  valproic acid (DEPAKENE) 250 MG/5ML solution Take 250 mg by mouth 4 (four) times daily. 01/06/21  Yes [provider]  vitamin B-12 (CYANOCOBALAMIN) 500 MCG tablet Take 500 mcg by mouth See admin instructions. Monday,Wednesday and friday   Yes [provider]    Allergies    Codeine, Cyclobenzaprine hcl, Flexeril [cyclobenzaprine], Hydrocodone, Hydrocodone, Sulfa antibiotics, Tramadol, Tramadol, Valium, and Valium [diazepam]  Review of Systems   Review of Systems Level 5 caveat cannot obtain review of systems due to dementia Physical Exam Updated Vital Signs BP (!) 154/78 (BP Location: Right Arm)    Pulse 85   Temp 98.9 F (37.2 C) (Oral)   Resp 17   Ht 5\' 8"  (1.727 m)   Wt 71 kg   SpO2 95%   BMI 23.80 kg/m   Physical Exam Constitutional:      Comments: Patient is alert but confused to place and time.  He is answering questions and following commands.  HENT:     Head:     Comments: Blood on the right upper lip.  No active bleeding.    Mouth/Throat:     Pharynx: Oropharynx is clear.     Comments: 1 cm laceration in the lip just parallel to the vermilion border right upper.  There are some swelling and contusion to the mucosal side but not through and through. Eyes:     Extraocular Movements: Extraocular movements intact.  Cardiovascular:     Rate and Rhythm: Normal rate and regular rhythm.  Pulmonary:     Effort: Pulmonary effort is normal.     Comments: Respiratory distress.  Occasional cough.  Breath sounds are soft throughout. Abdominal:     General: There is no distension.     Palpations: Abdomen is soft.     Tenderness: There is no abdominal tenderness. There is no guarding.  Musculoskeletal:        General: Normal range of motion.     Comments: No deformities of upper extremities.  Patient is using both upper extremities spontaneously without difficulty.  I can put both lower extremities through full range of motion with deep flexion and extension.  Patient can push against resistance both lower extremities without difficulty and good strength intact.  Skin:    General: Skin is warm and dry.  Neurological:     Comments: Patient is alert but confused to memory related issues.  Does follow commands.  No focal motor deficits.     ED Results / Procedures / Treatments   Labs (all labs ordered are listed, but only abnormal results are displayed) Labs Reviewed  URINALYSIS, ROUTINE W REFLEX MICROSCOPIC - Abnormal; Notable for the following components:      Result Value   Hgb urine dipstick SMALL (*)    Ketones, ur 5 (*)    Protein, ur 30 (*)    All other components  within normal limits  CBC WITH DIFFERENTIAL/PLATELET - Abnormal; Notable for the following components:   RBC 3.95 (*)    Hemoglobin 11.8 (*)    HCT 36.6 (*)    Platelets 133 (*)    Abs Immature Granulocytes 0.13 (*)  All other components within normal limits  SARS CORONAVIRUS 2 (TAT 6-24 HRS)  PROTIME-INR  APTT  VALPROIC ACID LEVEL    EKG EKG Interpretation  Date/Time:  Saturday Jan 20 2021 07:36:50 EDT Ventricular Rate:  78 PR Interval:  171 QRS Duration: 69 QT Interval:  464 QTC Calculation: 529 R Axis:   3 Text Interpretation: Sinus rhythm Atrial premature complex Nonspecific repol abnormality, diffuse leads no sig change from previous Confirmed by Charlesetta Shanks [phone]) on 02/06/2021 11:22:29 AM   Radiology CT Head Wo Contrast  Result Date: 01/19/2021 CLINICAL DATA:  Fall, head trauma EXAM: CT HEAD WITHOUT CONTRAST TECHNIQUE: Contiguous axial images were obtained from the base of the skull through the vertex without intravenous contrast. COMPARISON:  Head CT from earlier today FINDINGS: Brain: Acute hyperdense right cerebral convexity subdural hematoma measuring up to 8 mm thickness with associated mild sulcal effacement, new since head CT from earlier today. Minimal 2 mm left midline shift. No evidence of parenchymal hemorrhage. No mass lesions. No CT evidence of acute infarction. Nonspecific prominent subcortical and periventricular white matter hypodensity, most in keeping with chronic small vessel ischemic change. Generalized cerebral volume loss. Cerebral ventricle sizes are stable and concordant with the degree of cerebral volume loss. Vascular: No acute abnormality. Skull: No evidence of calvarial fracture. Sinuses/Orbits: The visualized paranasal sinuses are essentially clear. Other:  The mastoid air cells are unopacified. IMPRESSION: 1. Acute hyperdense right cerebral convexity subdural hematoma measuring up to 8 mm thickness with associated mild sulcal effacement and  minimal 2 mm left midline shift. 2. Generalized cerebral volume loss and prominent chronic small vessel ischemic changes in the cerebral white matter. Critical Value/emergent results were called by telephone at the time of interpretation on 01/19/2021 at 8:58 am to provider [facility] , who verbally acknowledged these results. Electronically Signed   By: Ilona Sorrel [name]   On: 02/05/2021 09:01   CT Head Wo Contrast  Result Date: 01/22/2021 CLINICAL DATA:  nursing home- found on floor, staff had put blanket on patient with right elbow abrasion. Unwitnessed fall. CKD,CHF, Dementia, TDM2 EXAM: CT HEAD WITHOUT CONTRAST CT CERVICAL SPINE WITHOUT CONTRAST TECHNIQUE: Multidetector CT imaging of the head and cervical spine was performed following the standard protocol without intravenous contrast. Multiplanar CT image reconstructions of the cervical spine were also generated. COMPARISON:  CT head cervical spine 09/10/2020, CT head cervical spine 01/19/2021 FINDINGS: CT HEAD FINDINGS Brain: Cerebral ventricle sizes are concordant with the degree of cerebral volume loss. Patchy and confluent areas of decreased attenuation are noted throughout the deep and periventricular white matter of the cerebral hemispheres bilaterally, compatible with chronic microvascular ischemic disease. No evidence of large-territorial acute infarction. No parenchymal hemorrhage. No mass lesion. No extra-axial collection. No mass effect or midline shift. No hydrocephalus. Basilar cisterns are patent. Vascular: No hyperdense vessel. Atherosclerotic calcifications are present within the cavernous internal carotid arteries. Skull: No acute fracture or focal lesion. Sinuses/Orbits: Paranasal sinuses and mastoid air cells are clear. The orbits are unremarkable. Other: Redemonstration of mild right frontal scalp edema. CT CERVICAL SPINE FINDINGS Alignment: Normal. Skull base and vertebrae: Multilevel degenerative changes of the spine. No acute  fracture. No aggressive appearing focal osseous lesion or focal pathologic process. Soft tissues and spinal canal: No prevertebral fluid or swelling. No visible canal hematoma. Visualized lungs: Biapical pleural/pulmonary scarring. Similar-appearing nodular subpleural opacity measuring 9 mm at the left apex. Emphysematous changes. Other: Asymmetric oropharynx with fullness on the right (5:53, 8:46) with finding likely related  to tongue positioning given no visualization of similar finding on CT cervical spine 01/19/2021. IMPRESSION: 1.  No acute intracranial abnormality. 2. No acute displaced fracture or traumatic listhesis of the cervical spine. Electronically Signed   By: Iven Finn [name]   On: 01/27/2021 03:55   CT Head Wo Contrast  Result Date: 01/19/2021 CLINICAL DATA:  Fall. Head trauma. Patient on blood thinners. Coagulopathy. EXAM: CT HEAD WITHOUT CONTRAST CT CERVICAL SPINE WITHOUT CONTRAST TECHNIQUE: Multidetector CT imaging of the head and cervical spine was performed following the standard protocol without intravenous contrast. Multiplanar CT image reconstructions of the cervical spine were also generated. COMPARISON:  CT of the head without contrast 01/16/2021 for the same indication in 09/10/2020 FINDINGS: CT HEAD FINDINGS Brain: Moderate atrophy and white matter disease is stable. Remote lacunar infarcts of the basal ganglia are stable. No acute infarct, hemorrhage, or mass lesion is present. The ventricles are proportionate to the degree of atrophy. No significant extraaxial fluid collection is present. The brainstem and cerebellum are within normal limits. Vascular: Atherosclerotic calcifications are present in the cavernous internal carotid arteries and at the dural margin of both vertebral arteries. No significant hyperdense vessel is present. Skull: Minimal supraorbital frontal scalp soft tissue swelling is improved. No new soft tissue injuries are present. Calvarium is intact.  Sinuses/Orbits: The paranasal sinuses and mastoid air cells are clear. The globes and orbits are within normal limits. CT CERVICAL SPINE FINDINGS Alignment: No significant listhesis or change is present. Skull base and vertebrae: A cervical junction is normal. Soft tissues and spinal canal: No prevertebral fluid or swelling. No visible canal hematoma. Atherosclerotic calcifications are present at the carotid bifurcations bilaterally without definite stenosis. Disc levels: Uncovertebral spurring and foraminal stenosis is greatest at C6-7 bilaterally and on the left at C5-6. Upper chest: Centrilobular emphysematous changes noted. Scarring at the right apex is stable. IMPRESSION: 1. No acute intracranial abnormality or significant interval change. 2. Stable atrophy and white matter disease. 3. Remote lacunar infarcts of the basal ganglia are stable. 4. Improved supraorbital frontal scalp soft tissue swelling. 5. No acute fracture or traumatic subluxation in the cervical spine. 6. Stable multilevel degenerative changes of the cervical spine as described. 7. Emphysema (ICD10-J43.9). Electronically Signed   By: San Morelle [name]   On: 01/19/2021 17:29   CT Cervical Spine Wo Contrast  Result Date: 02/04/2021 CLINICAL DATA:  nursing home- found on floor, staff had put blanket on patient with right elbow abrasion. Unwitnessed fall. CKD,CHF, Dementia, TDM2 EXAM: CT HEAD WITHOUT CONTRAST CT CERVICAL SPINE WITHOUT CONTRAST TECHNIQUE: Multidetector CT imaging of the head and cervical spine was performed following the standard protocol without intravenous contrast. Multiplanar CT image reconstructions of the cervical spine were also generated. COMPARISON:  CT head cervical spine 09/10/2020, CT head cervical spine 01/19/2021 FINDINGS: CT HEAD FINDINGS Brain: Cerebral ventricle sizes are concordant with the degree of cerebral volume loss. Patchy and confluent areas of decreased attenuation are noted throughout the deep  and periventricular white matter of the cerebral hemispheres bilaterally, compatible with chronic microvascular ischemic disease. No evidence of large-territorial acute infarction. No parenchymal hemorrhage. No mass lesion. No extra-axial collection. No mass effect or midline shift. No hydrocephalus. Basilar cisterns are patent. Vascular: No hyperdense vessel. Atherosclerotic calcifications are present within the cavernous internal carotid arteries. Skull: No acute fracture or focal lesion. Sinuses/Orbits: Paranasal sinuses and mastoid air cells are clear. The orbits are unremarkable. Other: Redemonstration of mild right frontal scalp edema. CT CERVICAL SPINE FINDINGS Alignment:  Normal. Skull base and vertebrae: Multilevel degenerative changes of the spine. No acute fracture. No aggressive appearing focal osseous lesion or focal pathologic process. Soft tissues and spinal canal: No prevertebral fluid or swelling. No visible canal hematoma. Visualized lungs: Biapical pleural/pulmonary scarring. Similar-appearing nodular subpleural opacity measuring 9 mm at the left apex. Emphysematous changes. Other: Asymmetric oropharynx with fullness on the right (5:53, 8:46) with finding likely related to tongue positioning given no visualization of similar finding on CT cervical spine 01/19/2021. IMPRESSION: 1.  No acute intracranial abnormality. 2. No acute displaced fracture or traumatic listhesis of the cervical spine. Electronically Signed   By: Iven Finn [name]   On: 28-Jan-2021 03:55   CT Cervical Spine Wo Contrast  Result Date: 01/19/2021 CLINICAL DATA:  Fall. Head trauma. Patient on blood thinners. Coagulopathy. EXAM: CT HEAD WITHOUT CONTRAST CT CERVICAL SPINE WITHOUT CONTRAST TECHNIQUE: Multidetector CT imaging of the head and cervical spine was performed following the standard protocol without intravenous contrast. Multiplanar CT image reconstructions of the cervical spine were also generated. COMPARISON:  CT of  the head without contrast 01/16/2021 for the same indication in 09/10/2020 FINDINGS: CT HEAD FINDINGS Brain: Moderate atrophy and white matter disease is stable. Remote lacunar infarcts of the basal ganglia are stable. No acute infarct, hemorrhage, or mass lesion is present. The ventricles are proportionate to the degree of atrophy. No significant extraaxial fluid collection is present. The brainstem and cerebellum are within normal limits. Vascular: Atherosclerotic calcifications are present in the cavernous internal carotid arteries and at the dural margin of both vertebral arteries. No significant hyperdense vessel is present. Skull: Minimal supraorbital frontal scalp soft tissue swelling is improved. No new soft tissue injuries are present. Calvarium is intact. Sinuses/Orbits: The paranasal sinuses and mastoid air cells are clear. The globes and orbits are within normal limits. CT CERVICAL SPINE FINDINGS Alignment: No significant listhesis or change is present. Skull base and vertebrae: A cervical junction is normal. Soft tissues and spinal canal: No prevertebral fluid or swelling. No visible canal hematoma. Atherosclerotic calcifications are present at the carotid bifurcations bilaterally without definite stenosis. Disc levels: Uncovertebral spurring and foraminal stenosis is greatest at C6-7 bilaterally and on the left at C5-6. Upper chest: Centrilobular emphysematous changes noted. Scarring at the right apex is stable. IMPRESSION: 1. No acute intracranial abnormality or significant interval change. 2. Stable atrophy and white matter disease. 3. Remote lacunar infarcts of the basal ganglia are stable. 4. Improved supraorbital frontal scalp soft tissue swelling. 5. No acute fracture or traumatic subluxation in the cervical spine. 6. Stable multilevel degenerative changes of the cervical spine as described. 7. Emphysema (ICD10-J43.9). Electronically Signed   By: San Morelle [name]   On: 01/19/2021 17:29    DG Chest Portable 1 View  Result Date: Jan 28, 2021 CLINICAL DATA:  87 year old male status post fall.  Found down. EXAM: PORTABLE CHEST 1 VIEW COMPARISON:  Portable chest.  01/19/2021 and earlier FINDINGS: Portable AP semi upright view at 0744 hours. Stable pulmonary hyperinflation. Prior CABG. Normal cardiac size and mediastinal contours. Visualized tracheal air column is within normal limits. Allowing for portable technique the lungs are clear. No pneumothorax or pleural effusion. No acute osseous abnormality identified. Negative visible bowel gas pattern. IMPRESSION: No acute cardiopulmonary abnormality or acute traumatic injury identified. Electronically Signed   By: Genevie Ann [name]   On: Jan 28, 2021 08:09   DG Chest Portable 1 View  Result Date: 01/19/2021 CLINICAL DATA:  Patient status post fall. EXAM: PORTABLE CHEST 1 VIEW  COMPARISON:  Single-view of the chest 01/16/2021. CT chest 09/10/2020. FINDINGS: The lungs are emphysematous but clear. The patient is status post CABG. Aortic atherosclerosis noted. No pneumothorax or pleural effusion. Heart size is normal. No acute or focal bony abnormality. IMPRESSION: No acute disease. Aortic Atherosclerosis (ICD10-I70.0) and Emphysema (ICD10-J43.9). Electronically Signed   By: Inge Rise [name]   On: 01/19/2021 11:47    Procedures .Marland KitchenLaceration Repair  Date/Time: 01/26/2021 11:18 AM Performed by: Charlesetta Shanks, MD Authorized by: Charlesetta Shanks, MD   Consent:    Consent obtained:  Verbal   Consent given by:  Patient Laceration details:    Location:  Lip   Lip location:  Upper exterior lip   Length (cm):  1   Depth (mm):  5 Pre-procedure details:    Preparation:  Patient was prepped and draped in usual sterile fashion Exploration:    Hemostasis achieved with:  Epinephrine   Wound extent: areolar tissue violated   Treatment:    Area cleansed with:  Povidone-iodine and saline   Amount of cleaning:  Standard Skin repair:    Repair method:   Sutures   Suture size:  4-0   Suture technique:  Simple interrupted   Number of sutures:  3 Approximation:    Approximation:  Close   [place] border well-aligned: yes   Repair type:    Repair type:  Intermediate Post-procedure details:    Dressing:  Open (no dressing)   Procedure completion:  Tolerated well, no immediate complications Comments:     2% lidocaine with epinephrine for anesthesia.  3 interrupted sutures in the exterior of the left chest at the vermilion border.  Good alignment.  4-0 Vicryl used.    CRITICAL CARE Performed by: Charlesetta Shanks   Total critical care time: 45 minutes  Critical care time was exclusive of separately billable procedures and treating other patients.  Critical care was necessary to treat or prevent imminent or life-threatening deterioration.  Critical care was time spent personally by me on the following activities: development of treatment plan with patient and/or surrogate as well as nursing, discussions with consultants, evaluation of patient's response to treatment, examination of patient, obtaining history from patient or surrogate, ordering and performing treatments and interventions, ordering and review of laboratory studies, ordering and review of radiographic studies, pulse oximetry and re-evaluation of patient's condition. Medications Ordered in ED Medications  isosorbide mononitrate (IMDUR) 24 hr tablet 30 mg (has no administration in time range)  metoprolol tartrate (LOPRESSOR) tablet 12.5 mg (has no administration in time range)  simvastatin (ZOCOR) tablet 40 mg (has no administration in time range)  donepezil (ARICEPT) tablet 10 mg (has no administration in time range)  memantine (NAMENDA) tablet 10 mg (has no administration in time range)  nicotine (NICODERM CQ - dosed in mg/24 hours) patch 14 mg (has no administration in time range)  sertraline (ZOLOFT) tablet 75 mg (has no administration in time range)  pantoprazole  (PROTONIX) EC tablet 40 mg (has no administration in time range)  melatonin tablet 5 mg (has no administration in time range)  valproic acid (DEPAKENE) 250 MG/5ML solution 250 mg (has no administration in time range)  albuterol (PROVENTIL) (2.5 MG/3ML) 0.083% nebulizer solution 2.5 mg (has no administration in time range)  fluticasone furoate-vilanterol (BREO ELLIPTA) 100-25 MCG/INH 1 puff (has no administration in time range)  sodium chloride flush (NS) 0.9 % injection 3 mL (has no administration in time range)  lactated ringers infusion ( Intravenous New Bag/Given 02/06/2021 1108)  acetaminophen (  TYLENOL) tablet 650 mg (has no administration in time range)    Or  acetaminophen (TYLENOL) suppository 650 mg (has no administration in time range)  morphine 2 MG/ML injection 2 mg (2 mg Intravenous Given 01/23/2021 1108)  docusate sodium (COLACE) capsule 100 mg (has no administration in time range)  polyethylene glycol (MIRALAX / GLYCOLAX) packet 17 g (has no administration in time range)  bisacodyl (DULCOLAX) EC tablet 5 mg (has no administration in time range)  ondansetron (ZOFRAN) tablet 4 mg (has no administration in time range)    Or  ondansetron (ZOFRAN) injection 4 mg (has no administration in time range)  hydrALAZINE (APRESOLINE) injection 5 mg (has no administration in time range)  insulin aspart (novoLOG) injection 0-9 Units (has no administration in time range)  haloperidol lactate (HALDOL) injection 5 mg (has no administration in time range)  tamsulosin (FLOMAX) capsule 0.4 mg (has no administration in time range)  prothrombin complex conc human (KCENTRA) IVPB 3,228 Units (0 Units Intravenous Stopped 01/30/2021 1034)  lidocaine-EPINEPHrine (XYLOCAINE W/EPI) 2 %-1:200000 (PF) injection 10 mL (10 mLs Infiltration Given 01/31/2021 1108)    ED Course  I have reviewed the triage vital signs and the nursing notes.  Pertinent labs & imaging results that were available during my care of the patient were  reviewed by me and considered in my medical decision making (see chart for details).  Clinical Course as of 02/09/2021 1124  Sat Jan 20, 2021  0758 I attempted to call both phone numbers listed under the contact for patient's son.  Phone number listed is not in service and cell number listed as mailbox full. [MP]  [phone] Tried telephone number listed on [place] for the patient's son.  Voicemail box full.  I contacted [facility].  They report this is the correct number in the son is aware patient is back in the emergency department again.  They report is been no change in baseline function.  He has not been febrile.  He does get agitated at night and try to get up without his walker and look for his wife.  He refuses assistance. [MP]  [phone] Consult: Reviewed with Dr. Christella Noa.  Agrees with management.  Will evaluate the patient in the emergency department. [MP]    Clinical Course User Index [MP] Charlesetta Shanks, MD   MDM Rules/Calculators/A&P                          Patient is anticoagulated on Eliquis.  He was discharged from the emergency department 4 hours before presenting a second time with another fall.  This fall has resulted in subdural hemorrhage.  Patient is alert and follows commands appropriately.  He has a nonfocal neurologic examination.  Due to being on Eliquis Kcentra was initiated.  Patient is admitted to medical service with consultation to neurosurgery. Final Clinical Impression(s) / ED Diagnoses Final diagnoses:  Subdural hemorrhage ([place])  Lip laceration, initial encounter  Anticoagulated    Rx / DC Orders ED Discharge Orders    None       Charlesetta Shanks, MD 01/31/2021 1125

## 2021-01-20 NOTE — H&P (Addendum)
History and Physical    Troy [name] [id number] DOB: Sep 06, 1933 DOA: 01/24/2021  PCP: [name], MD Consultants:  Prudence Davidson - podiatry; Krista Blue - neurology Patient coming from: [facility]; NOK: Son, [phone]  Chief Complaint: Falls  HPI: Troy [name] is a 87 y.o. male with medical history significant of SVT; dementia; BPH; HLD; DM; afib on Eliquis; COPD; and CAD presenting with a fall.  He was seen in the ER yesterday for a fall, discharged back to the facility.  He once again fell, this time unwitnessed and found in the floor. He was returned to the facility and fell again, this time with a SDH. He has had 3 falls in 2 days.  He has dementia and it is getting worse.  He was agitated last night, looking for his deceased wife.  Prior to this, he fell at Christmas at home - had COVID PNA and then to [facility] for rehab and moved to [place] about 6 weeks ago.  He has fallen a few times at [place] to use his walker and doesn't want help.    He can still feed and dress himself.  He isn't motivated to do much and so he is weak.  He isn't strong enough to do some of the things he wants to do.  He is unstable on his feet.  He has been clear about his desire for DNR/DNI.  They have been in the process of requesting palliative care support.    ED Course:  Small subdural from falls Eliquis at SNF.  Neurosurgery consulted.  Review of Systems: As per HPI; otherwise review of systems reviewed and negative.   Ambulatory Status:  Ambulates with a walker  COVID Vaccine Status:  None  Past Medical History:  Diagnosis Date  . Aorto-iliac atherosclerosis ([place]) 08/2015   by xray  . Barrett's esophagus    EGD 2012, rpt 2014, longterm PPI  . Chronic idiopathic constipation 07/17/2010  . Chronic obstructive pulmonary disease (COPD) (HCC)    spirometry with moderate COPD  . Coronary atherosclerosis of artery bypass graft   . Diabetes mellitus type II ~2008  .  Gastrointestinal ulcer 04/2011   with hematemesis s/p EGD by [facility]  . GERD (gastroesophageal reflux disease)   . Hearing loss   . HLD (hyperlipidemia)   . HNP (herniated nucleus pulposus), lumbar 2013   with compression of L5 nerve root and foot drop, also with lumbar DDD s/p surgery  . Hypertrophy of prostate without urinary obstruction and other lower urinary tract symptoms (LUTS)   . Memory loss   . Paroxysmal SVT (supraventricular tachycardia) ([place])    a. 3/14 => converted in ED with adenosine to AFib => NSR;  b. Echo 4/14:  Mild LVH, mild FBSH, EF 55-60%, Gr 1 DD  . Prostate nodule ~2009   Left-benign s/p eval Uro WNL ([place])  . Thrombocytopenia ([place])   . Tobacco use disorder     Past Surgical History:  Procedure Laterality Date  . COLONOSCOPY  03/2009   small hemorrhoids, 2 polyps (hyperplastic and adenomatous), rpt 5 yrs (Dr. Barron Schmid) - pt declined rpt scheduling  . COLONOSCOPY  10/2014   2 hyperplastic polyp, hemmorhoids ([place])  . CORONARY ARTERY BYPASS GRAFT  1999   Dr. Roxy Manns  . ESOPHAGOGASTRODUODENOSCOPY  05/16/2011   after hematemesis, no lesions found, consistent with barrett's esophagus rpt 2 yrs  . ESOPHAGOGASTRODUODENOSCOPY  06/2013   barrett's, small HH, recheck 2-3 yrs ([place])  . HEMORRHOID SURGERY    .  LUMBAR LAMINECTOMY/DECOMPRESSION MICRODISCECTOMY  01/31/2012   Procedure: LUMBAR LAMINECTOMY/DECOMPRESSION MICRODISCECTOMY 1 LEVEL;  Surgeon: Winfield Cunas, MD;  Location: [facility];  Service: Neurosurgery;  Laterality: Left;  LEFT Lumbar Four-Five Diskectomy  . NOSE SURGERY    . ROTATOR CUFF REPAIR  04/2010   right (but has bilat) Dr. Latanya Maudlin ([facility])    Social History   Socioeconomic History  . Marital status: Widowed    Spouse name: Not on file  . Number of children: 2  . Years of education: 47  . Highest education level: High school graduate  Occupational History  . Occupation: Retired from [employer]  Tobacco Use  . Smoking status:  Former Smoker    Packs/day: 1.00    Years: 60.00    Pack years: 60.00    Types: Cigarettes    Start date: 09/16/1952    Quit date: 09/09/2020    Years since quitting: 0.3  . Smokeless tobacco: Former Systems developer    Types: Snuff  . Tobacco comment: < 1 ppd; does dip sometimes  Vaping Use  . Vaping Use: Never used  Substance and Sexual Activity  . Alcohol use: Not Currently  . Drug use: No  . Sexual activity: Not on file  Other Topics Concern  . Not on file  Social History Narrative   Widower - wife passed away early 2017/01/03. Son lives nearby [place]), brings meals daily    Daughter lives in [place]   He is very active   He was in the [employer] for 4 years.    Activity: walking daily since back surgery    Diet: good water, not much fruits/vegetables    Right-handed.   Caffeine use: 2 cups coffee, some Pepsi   Social Determinants of Health   Financial Resource Strain: Not on file  Food Insecurity: Not on file  Transportation Needs: Not on file  Physical Activity: Not on file  Stress: Not on file  Social Connections: Not on file  Intimate Partner Violence: Not on file    Allergies  Allergen Reactions  . Codeine     REACTION: vomiting  . Cyclobenzaprine Hcl     REACTION: vomiting  . Flexeril [Cyclobenzaprine]   . Hydrocodone Nausea Only and Other (See Comments)    "crazy"  . Hydrocodone   . Sulfa Antibiotics     Per mar  . Tramadol Other (See Comments)    woozy  . Tramadol   . Valium Other (See Comments)    sedation  . Valium [Diazepam]     Family History  Problem Relation Age of Onset  . Cancer Father 24       colon  . Memory loss Mother     Prior to Admission medications   Medication Sig Start Date End Date Taking? Authorizing Provider  albuterol (VENTOLIN HFA) 108 (90 Base) MCG/ACT inhaler Inhale 2 puffs into the lungs every 6 (six) hours as needed for wheezing. 12/13/20   [provider]  apixaban (ELIQUIS) 2.5 MG TABS tablet Take 1 tablet (2.5 mg  total) by mouth 2 (two) times daily. 09/19/20   Thurnell Lose, MD  BREO ELLIPTA 100-25 MCG/INH AEPB Inhale 1 puff into the lungs daily. 12/14/20   [provider]  donepezil (ARICEPT) 10 MG tablet Take 1 tablet (10 mg total) by mouth at bedtime. 04/10/20   Marcial Pacas, MD  donepezil (ARICEPT) 10 MG tablet Take 10 mg by mouth at bedtime. 12/25/20   [provider]  ELIQUIS 2.5 MG  TABS tablet Take 2.5 mg by mouth 2 (two) times daily. 12/21/20   [provider]  fluticasone furoate-vilanterol (BREO ELLIPTA) 100-25 MCG/INH AEPB Inhale 1 puff into the lungs daily. For COPD    [provider]  hydrocortisone butyrate (LUCOID) 0.1 % CREA cream Apply 1 application topically 2 (two) times daily. Apply to hands, chest and back    [provider]  HYDROCORTISONE, TOPICAL, 1 % SOLN Apply 1 application topically See admin instructions. Apply to hands,chest and back  twice daily for rash    [provider]  isosorbide mononitrate (IMDUR) 30 MG 24 hr tablet TAKE 1 TABLET BY MOUTH  DAILY Patient taking differently: Take 30 mg by mouth daily. 12/13/20   [name], MD  isosorbide mononitrate (IMDUR) 30 MG 24 hr tablet Take 30 mg by mouth daily. 01/15/21   [provider]  melatonin 5 MG TABS Take 5 mg by mouth at bedtime. For Insomnia    [provider]  melatonin 5 MG TABS Take 5 mg by mouth at bedtime.    [provider]  memantine (NAMENDA) 10 MG tablet Take 1 tablet (10 mg total) by mouth 2 (two) times daily. 04/10/20   Marcial Pacas, MD  memantine (NAMENDA) 10 MG tablet Take 10 mg by mouth 2 (two) times daily. 12/24/20   [provider]  metFORMIN (GLUCOPHAGE) 500 MG tablet Take 1 tablet (500 mg total) by mouth daily with breakfast. 01/24/20   [name], MD  metFORMIN (GLUCOPHAGE) 500 MG tablet Take 500 mg by mouth daily. 12/21/20   [provider]  metoprolol tartrate (LOPRESSOR) 25 MG tablet TAKE ONE-HALF TABLET  BY  MOUTH TWICE DAILY Patient taking differently: Take 12.5 mg by mouth 2 (two) times daily. 11/21/20   [name], MD  metoprolol tartrate (LOPRESSOR) 25 MG tablet Take 12.5 mg by mouth in the morning and at bedtime. 12/24/20   [provider]  nicotine (NICODERM CQ - DOSED IN MG/24 HOURS) 14 mg/24hr patch Place 14 mg onto the skin daily as needed (tobacco addiction). 01/15/21   [provider]  nicotine (NICODERM CQ - DOSED IN MG/24 HOURS) 21 mg/24hr patch Place 21 mg onto the skin daily.    [provider]  nicotine (NICODERM CQ - DOSED IN MG/24 HOURS) 21 mg/24hr patch Place 21 mg onto the skin daily. Patient not taking: No sig reported    [provider]  omeprazole (PRILOSEC) 40 MG capsule TAKE 1 CAPSULE BY MOUTH  DAILY Patient taking differently: Take 40 mg by mouth daily. 11/21/20   [name], MD  omeprazole (PRILOSEC) 40 MG capsule Take 40 mg by mouth daily. 12/27/20   [provider]  OXYGEN Inhale 2 L into the lungs daily as needed (Shortness of breath).    [provider]  OXYGEN Inhale 2 L into the lungs daily as needed (shortness of breath).    [provider]  sertraline (ZOLOFT) 50 MG tablet Take 75 mg by mouth daily.    [provider]  sertraline (ZOLOFT) 50 MG tablet Take 75 mg by mouth daily. 12/21/20   [provider]  simvastatin (ZOCOR) 40 MG tablet TAKE 1 TABLET BY MOUTH AT  BEDTIME Patient taking differently: Take 40 mg by mouth daily at 6 PM. 11/06/20   [name], MD  simvastatin (ZOCOR) 40 MG tablet Take 40 mg by mouth at bedtime. 12/24/20   [provider]  tamsulosin (FLOMAX) 0.4 MG CAPS capsule Take 1 capsule (0.4  mg total) by mouth daily. 09/20/20   Thurnell Lose, MD  tamsulosin (FLOMAX) 0.4 MG CAPS capsule Take 0.4 mg by mouth daily. 12/24/20   [provider]  valproic acid (DEPAKENE) 250 MG/5ML solution Take 250 mg by mouth 4 (four) times daily.     [provider]  valproic acid (DEPAKENE) 250 MG/5ML solution Take 250 mg by mouth 4 (four) times daily. 01/06/21   [provider]  vitamin B-12 (CYANOCOBALAMIN) 500 MCG tablet Take 1 tablet (500 mcg total) by mouth every Monday, Wednesday, and Friday. 01/28/20   [name], MD  vitamin B-12 (CYANOCOBALAMIN) 500 MCG tablet Take 500 mcg by mouth See admin instructions. Monday,Wednesday and friday    [provider]    Physical Exam: Vitals:   02/12/2021 0735 02/09/2021 0826 02/07/2021 0854  BP: (!) 154/78    Pulse: 85    Resp: 17    Temp:   98.9 F (37.2 C)  TempSrc:   Oral  SpO2: 95%    Weight:  71 kg   Height:  5\' 8"  (1.727 m)      . General:  Appears mildly agitated, blood on face . Eyes:   EOMI, normal lids, iris . ENT:  grossly normal hearing, R lower lip laceration, mmm . Neck:  no LAD, masses or thyromegaly . Cardiovascular:  RRR, no m/r/g. No LE edema.  [name] Respiratory:   CTA bilaterally with no wheezes/rales/rhonchi.  Normal respiratory effort. . Abdomen:  soft, NT, ND . Skin:  no rash or induration seen on limited exam; lip as above . Musculoskeletal:  grossly normal tone BUE/BLE, good ROM, no bony abnormality . Psychiatric:  blunted mood and affect, speech fluent and appropriate, AOx1 . Neurologic:  CN 2-12 grossly intact, moves all extremities in coordinated fashion    Radiological Exams on Admission: Independently reviewed - see discussion in A/P where applicable  CT Head Wo Contrast  Result Date: 01/24/2021 CLINICAL DATA:  Fall, head trauma EXAM: CT HEAD WITHOUT CONTRAST TECHNIQUE: Contiguous axial images were obtained from the base of the skull through the vertex without intravenous contrast. COMPARISON:  Head CT from earlier today FINDINGS: Brain: Acute hyperdense right cerebral convexity subdural hematoma measuring up to 8 mm thickness with associated mild sulcal effacement, new since head CT from earlier today. Minimal 2 mm left midline  shift. No evidence of parenchymal hemorrhage. No mass lesions. No CT evidence of acute infarction. Nonspecific prominent subcortical and periventricular white matter hypodensity, most in keeping with chronic small vessel ischemic change. Generalized cerebral volume loss. Cerebral ventricle sizes are stable and concordant with the degree of cerebral volume loss. Vascular: No acute abnormality. Skull: No evidence of calvarial fracture. Sinuses/Orbits: The visualized paranasal sinuses are essentially clear. Other:  The mastoid air cells are unopacified. IMPRESSION: 1. Acute hyperdense right cerebral convexity subdural hematoma measuring up to 8 mm thickness with associated mild sulcal effacement and minimal 2 mm left midline shift. 2. Generalized cerebral volume loss and prominent chronic small vessel ischemic changes in the cerebral white matter. Critical Value/emergent results were called by telephone at the time of interpretation on 01/15/2021 at 8:58 am to provider [facility] , who verbally acknowledged these results. Electronically Signed   By: Ilona Sorrel [name]   On: 02/05/2021 09:01   CT Head Wo Contrast  Result Date: 01/27/2021 CLINICAL DATA:  nursing home- found on floor, staff had put blanket on patient with right elbow abrasion. Unwitnessed fall. CKD,CHF, Dementia, TDM2 EXAM: CT HEAD WITHOUT  CONTRAST CT CERVICAL SPINE WITHOUT CONTRAST TECHNIQUE: Multidetector CT imaging of the head and cervical spine was performed following the standard protocol without intravenous contrast. Multiplanar CT image reconstructions of the cervical spine were also generated. COMPARISON:  CT head cervical spine 09/10/2020, CT head cervical spine 01/19/2021 FINDINGS: CT HEAD FINDINGS Brain: Cerebral ventricle sizes are concordant with the degree of cerebral volume loss. Patchy and confluent areas of decreased attenuation are noted throughout the deep and periventricular white matter of the cerebral hemispheres bilaterally,  compatible with chronic microvascular ischemic disease. No evidence of large-territorial acute infarction. No parenchymal hemorrhage. No mass lesion. No extra-axial collection. No mass effect or midline shift. No hydrocephalus. Basilar cisterns are patent. Vascular: No hyperdense vessel. Atherosclerotic calcifications are present within the cavernous internal carotid arteries. Skull: No acute fracture or focal lesion. Sinuses/Orbits: Paranasal sinuses and mastoid air cells are clear. The orbits are unremarkable. Other: Redemonstration of mild right frontal scalp edema. CT CERVICAL SPINE FINDINGS Alignment: Normal. Skull base and vertebrae: Multilevel degenerative changes of the spine. No acute fracture. No aggressive appearing focal osseous lesion or focal pathologic process. Soft tissues and spinal canal: No prevertebral fluid or swelling. No visible canal hematoma. Visualized lungs: Biapical pleural/pulmonary scarring. Similar-appearing nodular subpleural opacity measuring 9 mm at the left apex. Emphysematous changes. Other: Asymmetric oropharynx with fullness on the right (5:53, 8:46) with finding likely related to tongue positioning given no visualization of similar finding on CT cervical spine 01/19/2021. IMPRESSION: 1.  No acute intracranial abnormality. 2. No acute displaced fracture or traumatic listhesis of the cervical spine. Electronically Signed   By: Iven Finn [name]   On: 01/20/2021 03:55   CT Head Wo Contrast  Result Date: 01/19/2021 CLINICAL DATA:  Fall. Head trauma. Patient on blood thinners. Coagulopathy. EXAM: CT HEAD WITHOUT CONTRAST CT CERVICAL SPINE WITHOUT CONTRAST TECHNIQUE: Multidetector CT imaging of the head and cervical spine was performed following the standard protocol without intravenous contrast. Multiplanar CT image reconstructions of the cervical spine were also generated. COMPARISON:  CT of the head without contrast 01/16/2021 for the same indication in 09/10/2020 FINDINGS:  CT HEAD FINDINGS Brain: Moderate atrophy and white matter disease is stable. Remote lacunar infarcts of the basal ganglia are stable. No acute infarct, hemorrhage, or mass lesion is present. The ventricles are proportionate to the degree of atrophy. No significant extraaxial fluid collection is present. The brainstem and cerebellum are within normal limits. Vascular: Atherosclerotic calcifications are present in the cavernous internal carotid arteries and at the dural margin of both vertebral arteries. No significant hyperdense vessel is present. Skull: Minimal supraorbital frontal scalp soft tissue swelling is improved. No new soft tissue injuries are present. Calvarium is intact. Sinuses/Orbits: The paranasal sinuses and mastoid air cells are clear. The globes and orbits are within normal limits. CT CERVICAL SPINE FINDINGS Alignment: No significant listhesis or change is present. Skull base and vertebrae: A cervical junction is normal. Soft tissues and spinal canal: No prevertebral fluid or swelling. No visible canal hematoma. Atherosclerotic calcifications are present at the carotid bifurcations bilaterally without definite stenosis. Disc levels: Uncovertebral spurring and foraminal stenosis is greatest at C6-7 bilaterally and on the left at C5-6. Upper chest: Centrilobular emphysematous changes noted. Scarring at the right apex is stable. IMPRESSION: 1. No acute intracranial abnormality or significant interval change. 2. Stable atrophy and white matter disease. 3. Remote lacunar infarcts of the basal ganglia are stable. 4. Improved supraorbital frontal scalp soft tissue swelling. 5. No acute fracture or traumatic subluxation  in the cervical spine. 6. Stable multilevel degenerative changes of the cervical spine as described. 7. Emphysema (ICD10-J43.9). Electronically Signed   By: San Morelle [name]   On: 01/19/2021 17:29   CT Cervical Spine Wo Contrast  Result Date: 02/07/2021 CLINICAL DATA:  nursing  home- found on floor, staff had put blanket on patient with right elbow abrasion. Unwitnessed fall. CKD,CHF, Dementia, TDM2 EXAM: CT HEAD WITHOUT CONTRAST CT CERVICAL SPINE WITHOUT CONTRAST TECHNIQUE: Multidetector CT imaging of the head and cervical spine was performed following the standard protocol without intravenous contrast. Multiplanar CT image reconstructions of the cervical spine were also generated. COMPARISON:  CT head cervical spine 09/10/2020, CT head cervical spine 01/19/2021 FINDINGS: CT HEAD FINDINGS Brain: Cerebral ventricle sizes are concordant with the degree of cerebral volume loss. Patchy and confluent areas of decreased attenuation are noted throughout the deep and periventricular white matter of the cerebral hemispheres bilaterally, compatible with chronic microvascular ischemic disease. No evidence of large-territorial acute infarction. No parenchymal hemorrhage. No mass lesion. No extra-axial collection. No mass effect or midline shift. No hydrocephalus. Basilar cisterns are patent. Vascular: No hyperdense vessel. Atherosclerotic calcifications are present within the cavernous internal carotid arteries. Skull: No acute fracture or focal lesion. Sinuses/Orbits: Paranasal sinuses and mastoid air cells are clear. The orbits are unremarkable. Other: Redemonstration of mild right frontal scalp edema. CT CERVICAL SPINE FINDINGS Alignment: Normal. Skull base and vertebrae: Multilevel degenerative changes of the spine. No acute fracture. No aggressive appearing focal osseous lesion or focal pathologic process. Soft tissues and spinal canal: No prevertebral fluid or swelling. No visible canal hematoma. Visualized lungs: Biapical pleural/pulmonary scarring. Similar-appearing nodular subpleural opacity measuring 9 mm at the left apex. Emphysematous changes. Other: Asymmetric oropharynx with fullness on the right (5:53, 8:46) with finding likely related to tongue positioning given no visualization of  similar finding on CT cervical spine 01/19/2021. IMPRESSION: 1.  No acute intracranial abnormality. 2. No acute displaced fracture or traumatic listhesis of the cervical spine. Electronically Signed   By: Iven Finn [name]   On: 02/03/2021 03:55   CT Cervical Spine Wo Contrast  Result Date: 01/19/2021 CLINICAL DATA:  Fall. Head trauma. Patient on blood thinners. Coagulopathy. EXAM: CT HEAD WITHOUT CONTRAST CT CERVICAL SPINE WITHOUT CONTRAST TECHNIQUE: Multidetector CT imaging of the head and cervical spine was performed following the standard protocol without intravenous contrast. Multiplanar CT image reconstructions of the cervical spine were also generated. COMPARISON:  CT of the head without contrast 01/16/2021 for the same indication in 09/10/2020 FINDINGS: CT HEAD FINDINGS Brain: Moderate atrophy and white matter disease is stable. Remote lacunar infarcts of the basal ganglia are stable. No acute infarct, hemorrhage, or mass lesion is present. The ventricles are proportionate to the degree of atrophy. No significant extraaxial fluid collection is present. The brainstem and cerebellum are within normal limits. Vascular: Atherosclerotic calcifications are present in the cavernous internal carotid arteries and at the dural margin of both vertebral arteries. No significant hyperdense vessel is present. Skull: Minimal supraorbital frontal scalp soft tissue swelling is improved. No new soft tissue injuries are present. Calvarium is intact. Sinuses/Orbits: The paranasal sinuses and mastoid air cells are clear. The globes and orbits are within normal limits. CT CERVICAL SPINE FINDINGS Alignment: No significant listhesis or change is present. Skull base and vertebrae: A cervical junction is normal. Soft tissues and spinal canal: No prevertebral fluid or swelling. No visible canal hematoma. Atherosclerotic calcifications are present at the carotid bifurcations bilaterally without definite stenosis. Disc levels:  Uncovertebral spurring and foraminal stenosis is greatest at C6-7 bilaterally and on the left at C5-6. Upper chest: Centrilobular emphysematous changes noted. Scarring at the right apex is stable. IMPRESSION: 1. No acute intracranial abnormality or significant interval change. 2. Stable atrophy and white matter disease. 3. Remote lacunar infarcts of the basal ganglia are stable. 4. Improved supraorbital frontal scalp soft tissue swelling. 5. No acute fracture or traumatic subluxation in the cervical spine. 6. Stable multilevel degenerative changes of the cervical spine as described. 7. Emphysema (ICD10-J43.9). Electronically Signed   By: San Morelle [name]   On: 01/19/2021 17:29   DG Chest Portable 1 View  Result Date: 02/12/2021 CLINICAL DATA:  87 year old male status post fall.  Found down. EXAM: PORTABLE CHEST 1 VIEW COMPARISON:  Portable chest.  01/19/2021 and earlier FINDINGS: Portable AP semi upright view at 0744 hours. Stable pulmonary hyperinflation. Prior CABG. Normal cardiac size and mediastinal contours. Visualized tracheal air column is within normal limits. Allowing for portable technique the lungs are clear. No pneumothorax or pleural effusion. No acute osseous abnormality identified. Negative visible bowel gas pattern. IMPRESSION: No acute cardiopulmonary abnormality or acute traumatic injury identified. Electronically Signed   By: Genevie Ann [name]   On: 02/01/2021 08:09   DG Chest Portable 1 View  Result Date: 01/19/2021 CLINICAL DATA:  Patient status post fall. EXAM: PORTABLE CHEST 1 VIEW COMPARISON:  Single-view of the chest 01/16/2021. CT chest 09/10/2020. FINDINGS: The lungs are emphysematous but clear. The patient is status post CABG. Aortic atherosclerosis noted. No pneumothorax or pleural effusion. Heart size is normal. No acute or focal bony abnormality. IMPRESSION: No acute disease. Aortic Atherosclerosis (ICD10-I70.0) and Emphysema (ICD10-J43.9). Electronically Signed   By: Inge Rise [name]   On: 01/19/2021 11:47    EKG: Independently reviewed.  NSR with rate 78; prolonged QTc 529; nonspecific ST changes with no evidence of acute ischemia   Labs on Admission: I have personally reviewed the available labs and imaging studies at the time of the admission.  Pertinent labs:   Today:  Hgb 11.8 Platelets 133 5/3: BUN 33/Creatinine 1.63/GFR 41 Hgb 10.8 Platelets 124   Assessment/Plan Principal Problem:   SDH (subdural hematoma) (HCC) Active Problems:   Diabetes mellitus type 2 with complications (HCC)   Hyperlipidemia associated with type 2 diabetes mellitus (HCC)   COPD (chronic obstructive pulmonary disease) (HCC)   CAD (coronary artery disease)   Dementia without behavioral disturbance (HCC)   DNR (do not resuscitate)   Falls -Patient with 3 falls in 2 days requiring ER evaluations -He recently moved into ALF and is struggling to remember his walker when attempting to ambulate -No current indication for transition to SNF but may need this in the future -Given recurrent falls, discussed risks/benefits of AC and his son is in agreement with discontinuation of Eliquis -EDP asked to look at lip laceration to determine if sutures are indicated -Check valproic acid level  SDH -Patient with recurrent falls at SNF -69mm SDH with 2 mm midline shift -For now, will observe in neuro-progressive unit -Repeat CT tomorrow  -Neurosurgery consult is pending -Eliquis reversed, as per protocol  Dementia -He is having difficulty with agitation/behavioral disturbance -Delirium precautions ordered -Will order prn Haldol -Continue home Aricept, Namenda, Melatonin, Zoloft, Valproic acid  HTN -Continue Lopressor  Afib -Rate control with Lopressor -D/c Eliquis  DM -hold Glucophage -Cover with sensitive-scale SSI -Tight glycemic control is likely unnecessary  COPD -Continue Breo  CAD -Continue Imdur -Continue Zocor  H/o tobacco  dependence -None  since 12/25 -Continue nicotine patch, decrease to 14 mg/day  Stage 3b CKD -Appears to be stable at this time -Will recheck in AM  Goals of care -Patient may benefit from palliative care/hospice services at his facility -Palliative care consult requested -I have discussed code status with the patient's son and the patient would not desire resuscitation and would prefer to die a natural death should that situation arise. -He will need a gold out of facility DNR form at the time of discharge    Note: This patient has been tested and is pending for the novel coronavirus COVID-19. The patient has NOT been vaccinated against COVID-19.   Level of care: Progressive DVT prophylaxis: SCDs Code Status: DNR - confirmed with family Family Communication: Son was present throughout the evaluation Disposition Plan:  The patient is from: ALF  Anticipated d/c is to: ALF  Anticipated d/c date will depend on clinical response to treatment, but possibly as early as tomorrow if he has excellent response to treatment  Patient is currently: acutely ill Consults called: Palliative care; TOC team Admission status:  It is my clinical opinion that referral for OBSERVATION is reasonable and necessary in this patient based on the above information provided. The aforementioned taken together are felt to place the patient at high risk for further clinical deterioration. However it is anticipated that the patient may be medically stable for discharge from the hospital within 24 to 48 hours.    Karmen Bongo MD [facility]   How to contact the [facility] Attending or Consulting provider [place] or covering provider during after hours [place], for this patient?  1. Check the care team in [facility] and look for a) attending/consulting [facility] provider listed and b) the [facility] team listed 2. Log into [email or web address] and use [place]'s universal password to access. If you do not have the password, please contact the hospital  operator. 3. Locate the [facility] provider you are looking for under [facility] and page to a number that you can be directly reached. 4. If you still have difficulty reaching the provider, please page the [facility] (Director on Call) for the Hospitalists listed on amion for assistance.   2021/01/23, 11:05 AM

## 2021-01-20 NOTE — ED Notes (Signed)
Attempted report x1. 

## 2021-01-20 NOTE — ED Notes (Signed)
CBG reads 125

## 2021-01-20 NOTE — ED Notes (Signed)
Palliative NP at bedside 

## 2021-01-20 NOTE — ED Triage Notes (Signed)
Pt BIBA from [facility] on [address] after an unwitnessed fall. Pt on Eliquis. Pt struck face and has minimal bleeding to right upper lip. Pt seen yesterday for a mechanical fall too. Pt has dementia and is at baseline. Bleeding controlled. VSS en route with EMS.

## 2021-01-21 ENCOUNTER — Observation Stay (HOSPITAL_COMMUNITY): Payer: Medicare Other

## 2021-01-21 DIAGNOSIS — N1832 Chronic kidney disease, stage 3b: Secondary | ICD-10-CM | POA: Diagnosis present

## 2021-01-21 DIAGNOSIS — R0600 Dyspnea, unspecified: Secondary | ICD-10-CM | POA: Diagnosis not present

## 2021-01-21 DIAGNOSIS — E1122 Type 2 diabetes mellitus with diabetic chronic kidney disease: Secondary | ICD-10-CM | POA: Diagnosis present

## 2021-01-21 DIAGNOSIS — Z28310 Unvaccinated for covid-19: Secondary | ICD-10-CM | POA: Diagnosis not present

## 2021-01-21 DIAGNOSIS — Z9981 Dependence on supplemental oxygen: Secondary | ICD-10-CM | POA: Diagnosis not present

## 2021-01-21 DIAGNOSIS — S065X0A Traumatic subdural hemorrhage without loss of consciousness, initial encounter: Secondary | ICD-10-CM | POA: Diagnosis not present

## 2021-01-21 DIAGNOSIS — N400 Enlarged prostate without lower urinary tract symptoms: Secondary | ICD-10-CM | POA: Diagnosis present

## 2021-01-21 DIAGNOSIS — I2510 Atherosclerotic heart disease of native coronary artery without angina pectoris: Secondary | ICD-10-CM | POA: Diagnosis present

## 2021-01-21 DIAGNOSIS — I25810 Atherosclerosis of coronary artery bypass graft(s) without angina pectoris: Secondary | ICD-10-CM | POA: Diagnosis present

## 2021-01-21 DIAGNOSIS — S01511A Laceration without foreign body of lip, initial encounter: Secondary | ICD-10-CM | POA: Diagnosis present

## 2021-01-21 DIAGNOSIS — Z7951 Long term (current) use of inhaled steroids: Secondary | ICD-10-CM | POA: Diagnosis not present

## 2021-01-21 DIAGNOSIS — I6200 Nontraumatic subdural hemorrhage, unspecified: Secondary | ICD-10-CM | POA: Diagnosis not present

## 2021-01-21 DIAGNOSIS — Y92098 Other place in other non-institutional residence as the place of occurrence of the external cause: Secondary | ICD-10-CM | POA: Diagnosis not present

## 2021-01-21 DIAGNOSIS — Z515 Encounter for palliative care: Secondary | ICD-10-CM

## 2021-01-21 DIAGNOSIS — J449 Chronic obstructive pulmonary disease, unspecified: Secondary | ICD-10-CM | POA: Diagnosis present

## 2021-01-21 DIAGNOSIS — Z8616 Personal history of COVID-19: Secondary | ICD-10-CM | POA: Diagnosis not present

## 2021-01-21 DIAGNOSIS — Z66 Do not resuscitate: Secondary | ICD-10-CM | POA: Diagnosis present

## 2021-01-21 DIAGNOSIS — I5032 Chronic diastolic (congestive) heart failure: Secondary | ICD-10-CM | POA: Diagnosis present

## 2021-01-21 DIAGNOSIS — Z7984 Long term (current) use of oral hypoglycemic drugs: Secondary | ICD-10-CM | POA: Diagnosis not present

## 2021-01-21 DIAGNOSIS — F0150 Vascular dementia without behavioral disturbance: Secondary | ICD-10-CM | POA: Diagnosis not present

## 2021-01-21 DIAGNOSIS — Z7901 Long term (current) use of anticoagulants: Secondary | ICD-10-CM | POA: Diagnosis not present

## 2021-01-21 DIAGNOSIS — R296 Repeated falls: Secondary | ICD-10-CM | POA: Diagnosis present

## 2021-01-21 DIAGNOSIS — S065X9A Traumatic subdural hemorrhage with loss of consciousness of unspecified duration, initial encounter: Principal | ICD-10-CM

## 2021-01-21 DIAGNOSIS — E538 Deficiency of other specified B group vitamins: Secondary | ICD-10-CM | POA: Diagnosis present

## 2021-01-21 DIAGNOSIS — K219 Gastro-esophageal reflux disease without esophagitis: Secondary | ICD-10-CM | POA: Diagnosis present

## 2021-01-21 DIAGNOSIS — I130 Hypertensive heart and chronic kidney disease with heart failure and stage 1 through stage 4 chronic kidney disease, or unspecified chronic kidney disease: Secondary | ICD-10-CM | POA: Diagnosis present

## 2021-01-21 DIAGNOSIS — W1830XA Fall on same level, unspecified, initial encounter: Secondary | ICD-10-CM | POA: Diagnosis present

## 2021-01-21 DIAGNOSIS — F0391 Unspecified dementia with behavioral disturbance: Secondary | ICD-10-CM | POA: Diagnosis present

## 2021-01-21 DIAGNOSIS — S065XAA Traumatic subdural hemorrhage with loss of consciousness status unknown, initial encounter: Secondary | ICD-10-CM | POA: Diagnosis present

## 2021-01-21 DIAGNOSIS — G9341 Metabolic encephalopathy: Secondary | ICD-10-CM | POA: Diagnosis present

## 2021-01-21 DIAGNOSIS — E785 Hyperlipidemia, unspecified: Secondary | ICD-10-CM | POA: Diagnosis present

## 2021-01-21 LAB — GLUCOSE, CAPILLARY: Glucose-Capillary: 107 mg/dL — ABNORMAL HIGH (ref 70–99)

## 2021-01-21 LAB — CBC
HCT: 33.6 % — ABNORMAL LOW (ref 39.0–52.0)
Hemoglobin: 10.7 g/dL — ABNORMAL LOW (ref 13.0–17.0)
MCH: 29.2 pg (ref 26.0–34.0)
MCHC: 31.8 g/dL (ref 30.0–36.0)
MCV: 91.6 fL (ref 80.0–100.0)
Platelets: 124 10*3/uL — ABNORMAL LOW (ref 150–400)
RBC: 3.67 MIL/uL — ABNORMAL LOW (ref 4.22–5.81)
RDW: 14.5 % (ref 11.5–15.5)
WBC: 8.2 10*3/uL (ref 4.0–10.5)
nRBC: 0 % (ref 0.0–0.2)

## 2021-01-21 LAB — BASIC METABOLIC PANEL
Anion gap: 11 (ref 5–15)
BUN: 30 mg/dL — ABNORMAL HIGH (ref 8–23)
CO2: 21 mmol/L — ABNORMAL LOW (ref 22–32)
Calcium: 9 mg/dL (ref 8.9–10.3)
Chloride: 107 mmol/L (ref 98–111)
Creatinine, Ser: 1.56 mg/dL — ABNORMAL HIGH (ref 0.61–1.24)
GFR, Estimated: 43 mL/min — ABNORMAL LOW (ref >60)
Glucose, Bld: 121 mg/dL — ABNORMAL HIGH (ref 70–99)
Potassium: 4.2 mmol/L (ref 3.5–5.1)
Sodium: 139 mmol/L (ref 135–145)

## 2021-01-21 MED ORDER — MORPHINE 100MG IN NS 100ML (1MG/ML) PREMIX INFUSION
2.0000 mg/h | INTRAVENOUS | Status: DC
  Administered 2021-01-21: 2 mg/h via INTRAVENOUS
  Filled 2021-01-21: qty 100

## 2021-01-21 MED ORDER — DEXTROSE-NACL 5-0.45 % IV SOLN: INTRAVENOUS | Status: DC

## 2021-01-21 MED ORDER — GLYCOPYRROLATE 0.2 MG/ML IJ SOLN: 0.2000 mg | INTRAMUSCULAR | Status: DC | PRN

## 2021-01-21 MED ORDER — HALOPERIDOL LACTATE 2 MG/ML PO CONC
0.5000 mg | ORAL | Status: DC | PRN
  Filled 2021-01-21: qty 0.3

## 2021-01-21 MED ORDER — LORAZEPAM 2 MG/ML IJ SOLN
1.0000 mg | INTRAMUSCULAR | Status: DC | PRN
Start: 2021-01-21 — End: 2021-01-22
  Administered 2021-01-21: 1 mg via INTRAVENOUS
  Filled 2021-01-21: qty 1

## 2021-01-21 MED ORDER — LORAZEPAM 1 MG PO TABS: 1.0000 mg | ORAL_TABLET | ORAL | Status: DC | PRN

## 2021-01-21 MED ORDER — DIPHENHYDRAMINE HCL 50 MG/ML IJ SOLN: 12.5000 mg | INTRAMUSCULAR | Status: DC | PRN

## 2021-01-21 MED ORDER — MORPHINE BOLUS VIA INFUSION
1.0000 mg | INTRAVENOUS | Status: DC | PRN
  Filled 2021-01-21: qty 2

## 2021-01-21 MED ORDER — ACETAMINOPHEN 325 MG PO TABS: 650.0000 mg | ORAL_TABLET | Freq: Four times a day (QID) | ORAL | Status: DC | PRN

## 2021-01-21 MED ORDER — LORAZEPAM 2 MG/ML PO CONC: 1.0000 mg | ORAL | Status: DC | PRN

## 2021-01-21 MED ORDER — ACETAMINOPHEN 650 MG RE SUPP: 650.0000 mg | Freq: Four times a day (QID) | RECTAL | Status: DC | PRN

## 2021-01-21 MED ORDER — GLYCOPYRROLATE 0.2 MG/ML IJ SOLN: 0.6000 mg | INTRAMUSCULAR | Status: DC | PRN

## 2021-01-21 MED ORDER — MORPHINE SULFATE (PF) 2 MG/ML IV SOLN
2.0000 mg | INTRAVENOUS | Status: DC | PRN
Start: 2021-01-21 — End: 2021-01-22
  Administered 2021-01-21 (×2): 2 mg via INTRAVENOUS
  Filled 2021-01-21 (×2): qty 1

## 2021-01-21 MED ORDER — SCOPOLAMINE 1 MG/3DAYS TD PT72
1.0000 | MEDICATED_PATCH | TRANSDERMAL | Status: DC
  Filled 2021-01-21: qty 1

## 2021-01-21 MED ORDER — GLYCOPYRROLATE 0.2 MG/ML IJ SOLN
0.2000 mg | INTRAMUSCULAR | Status: DC | PRN
  Administered 2021-01-21: 0.2 mg via INTRAVENOUS
  Filled 2021-01-21: qty 1

## 2021-01-21 MED ORDER — HALOPERIDOL 0.5 MG PO TABS: 0.5000 mg | ORAL_TABLET | ORAL | Status: DC | PRN

## 2021-01-21 MED ORDER — HALOPERIDOL LACTATE 5 MG/ML IJ SOLN
0.5000 mg | INTRAMUSCULAR | Status: DC | PRN
Start: 2021-01-21 — End: 2021-01-22

## 2021-01-21 MED ORDER — GLYCOPYRROLATE 1 MG PO TABS
1.0000 mg | ORAL_TABLET | ORAL | Status: DC | PRN
  Filled 2021-01-21: qty 1

## 2021-01-21 MED ORDER — GLYCOPYRROLATE 0.2 MG/ML IJ SOLN
0.4000 mg | INTRAMUSCULAR | Status: AC
  Administered 2021-01-21: 0.4 mg via INTRAVENOUS
  Filled 2021-01-21: qty 2

## 2021-01-22 DIAGNOSIS — N183 Chronic kidney disease, stage 3 unspecified: Secondary | ICD-10-CM

## 2021-01-22 DIAGNOSIS — Z7901 Long term (current) use of anticoagulants: Secondary | ICD-10-CM

## 2021-01-22 DIAGNOSIS — R4182 Altered mental status, unspecified: Secondary | ICD-10-CM

## 2021-01-24 ENCOUNTER — Telehealth: Payer: Self-pay | Admitting: Family Medicine

## 2021-01-24 NOTE — Telephone Encounter (Signed)
Called to speak with [name] to express my condolences. Unable to reach. Will try again later. [phone]

## 2021-01-26 NOTE — Telephone Encounter (Signed)
Called again, no answer, will try again later.

## 2021-02-07 NOTE — Telephone Encounter (Signed)
Called again, unable to reach son.

## 2021-02-13 NOTE — Telephone Encounter (Signed)
Called again to speak with son [name].  Left message expressing my condolences.

## 2021-02-14 NOTE — Progress Notes (Signed)
Wasted 80 ml including the morphine in the tubing, witnessed by Beckie Busing, Therapist, sports.

## 2021-02-14 NOTE — Progress Notes (Signed)
Patient ID: Troy [name], male   DOB: 1933/03/25, 87 y.o.   MRN: [id number] Troy [name] is a 87 y.o. male Now on comfort care. Call if any questions

## 2021-02-14 NOTE — Progress Notes (Signed)
patient remains with significant amount discomfort, significantly tachypneic, with significant upper airway gurgling, despite receiving 3 doses of morphine over last few hours, at this point I will proceed with a morphine drip comfort care protocol at 2 mg/h, will keep as needed morphine pushes on top as well, he is on glycopyrrolate for secretions,  despite that rest seems secretion seems to be significant, so we will add scopolamine patch. Updated son and multiple family members at bedside. Phillips Climes MD

## 2021-02-14 NOTE — Plan of Care (Signed)
Patient has been transitioned to comfort care measures.   Problem: Education: Goal: Knowledge of General Education information will improve Description: Including pain rating scale, medication(s)/side effects and non-pharmacologic comfort measures Outcome: Not Progressing   Problem: Health Behavior/Discharge Planning: Goal: Ability to manage health-related needs will improve Outcome: Not Progressing   Problem: Clinical Measurements: Goal: Ability to maintain clinical measurements within normal limits will improve Outcome: Not Progressing Goal: Will remain free from infection Outcome: Not Progressing Goal: Diagnostic test results will improve Outcome: Not Progressing Goal: Respiratory complications will improve Outcome: Not Progressing Goal: Cardiovascular complication will be avoided Outcome: Not Progressing   Problem: Activity: Goal: Risk for activity intolerance will decrease Outcome: Not Progressing   Problem: Nutrition: Goal: Adequate nutrition will be maintained Outcome: Not Progressing   Problem: Coping: Goal: Level of anxiety will decrease Outcome: Not Progressing   Problem: Elimination: Goal: Will not experience complications related to bowel motility Outcome: Not Progressing Goal: Will not experience complications related to urinary retention Outcome: Not Progressing   Problem: Pain Managment: Goal: General experience of comfort will improve Outcome: Not Progressing

## 2021-02-14 NOTE — Progress Notes (Signed)
Daily Progress Note   Patient Name: Troy [name]       Date: 2021-02-04 DOB: 23-Feb-1933  Age: 87 y.o. MRN#: [id number] Attending Physician: Albertine Patricia, MD Primary Care Physician: [name], MD Admit Date: 01/18/2021  Reason for Consultation/Follow-up: end of life care, symptoms management  Subjective: Chart reviewed - note patient has acutely declined since yesterday and was placed on full comfort care earlier today. Currently on morphine infusion at 2 mg/hr. Patient appears uncomfortable with labored breathing. Unresponsive to voice and light touch.  Excessive respiratory secretions noted. I administered a morphine bolus dose of 2 mg via the infusion.  Family present at bedside. Education and counseling provided on expectations at EOL. Emotional support provided.      Length of Stay: 0  Current Medications: Scheduled Meds:  . docusate sodium  100 mg Oral BID  . glycopyrrolate  0.4 mg Intravenous NOW  . melatonin  5 mg Oral QHS  . memantine  10 mg Oral BID  . scopolamine  1 patch Transdermal Q72H  . sodium chloride flush  3 mL Intravenous Q12H  . tamsulosin  0.4 mg Oral Daily  . valproic acid  250 mg Oral QID    Continuous Infusions: . morphine 2 mg/hr (Feb 04, 2021 1559)    PRN Meds: acetaminophen **OR** acetaminophen, albuterol, bisacodyl, diphenhydrAMINE, [DISCONTINUED] glycopyrrolate **OR** [DISCONTINUED] glycopyrrolate **OR** glycopyrrolate, haloperidol **OR** haloperidol **OR** haloperidol lactate, LORazepam **OR** LORazepam **OR** LORazepam, morphine injection, morphine, nicotine, ondansetron **OR** ondansetron (ZOFRAN) IV, polyethylene glycol        Vital Signs: BP (!) 168/66 (BP Location: Right Arm)   Pulse (!) 128   Temp 99.5 F (37.5 C) (Axillary)    Resp (!) 38   Ht 5\' 8"  (1.727 m)   Wt 71 kg   SpO2 92%   BMI 23.80 kg/m  SpO2: SpO2: 92 % O2 Device: O2 Device: Nasal Cannula O2 Flow Rate: O2 Flow Rate (L/min): 2 L/min  Intake/output summary:   Intake/Output Summary (Last 24 hours) at 2021-02-04 1637 Last data filed at 02/04/21 1042 Gross per 24 hour  Intake 13 ml  Output 810 ml  Net -797 ml   LBM: Last BM Date:  (PTA) Baseline Weight: Weight: 71 kg Most recent weight: Weight: 71 kg       Palliative Assessment/Data: PPS 10%  Palliative Care Assessment & Plan   HPI/Patient Profile: 87 y.o. male  with past medical history of DM, SVT, COPD, dementia, CKD stage IIIb, atrial fibrillation on Eliquis, and CHF presented to [facility] ED on 07-Feb-2021 from [place] after an unwitnessed fall.  Patient was seen early this morning for evaluation after having an unwitnessed fall, was discharged from ED, and returned around 2 hours later after having another fall.  Per notes he has had 3 falls in the last 2 days -noted this is patient's 5th ED visit in 6 months.  Patient was admitted on 02-07-2021 with subdural hematoma.  Eliquis was reversed and stopped.  Neurosurgery was consulted -do not feel this is an operative lesion and plan for follow-up head CT unless any changes in condition.  Assessment: - subdural hematoma - COPD - CAD - dementia - end of life care  Recommendations/Plan:  Continue full comfort measures  DNR/DNI as previously documented  Continue morphine infusion   Added order to administer 1-2 mg morphine bolus via infusion every 15 min prn for uncontrolled pain or dyspnea  Robinul 0.4 mg now x 1 dose  Increased PRN robinul to 0.6 mg every 4 hours  Provide frequent assessments and administer PRN medications as clinically necessary to ensure EOL comfort  PMT will continue to follow holistically   Goals of Care and Additional Recommendations:  Limitations on Scope of Treatment: Full Comfort Care  Code  Status: DNR/DNI  Prognosis:   Hours - Days  Discharge Planning:  Anticipated Hospital Death  Care plan was discussed with bedside RN  Thank you for allowing the Palliative Medicine Team to assist in the care of this patient.   Total Time 15 minutes Prolonged Time Billed  no       Greater than 50%  of this time was spent counseling and coordinating care related to the above assessment and plan.  Lavena Bullion, NP  Please contact Palliative Medicine Team phone at [phone] for questions and concerns.

## 2021-02-14 NOTE — Progress Notes (Signed)
Pt passed away at 2125 hr, in the presence of his son, On call paged, 2 RNs verified and pronounced the death, Medical examiner and [place] [employer] were called and pt qualified as a medical examiner case.

## 2021-02-14 NOTE — Progress Notes (Signed)
PROGRESS NOTE    Troy [name]  [id number] DOB: 08-Nov-1932 DOA: 01/25/2021 PCP: [name], MD     Chief Complaint  Patient presents with  . Fall    Brief Narrative:   Troy [name] is a 87 y.o. male with medical history significant of SVT; dementia; BPH; HLD; DM; afib on Eliquis; COPD; and CAD presenting with a fall.  He was seen in the ER yesterday for a fall, discharged back to the facility.  He once again fell, this time unwitnessed and found in the floor. He was returned to the facility and fell again, this time with a SDH. He has had 3 falls in 2 days.  He has dementia and it is getting worse.  He was agitated last night, looking for his deceased wife.  Prior to this, he fell at Christmas at home - had COVID PNA and then to [facility] for rehab and moved to [place] about 6 weeks ago.  He has fallen a few times at [place] to use his walker and doesn't want help.    He can still feed and dress himself.  He isn't motivated to do much and so he is weak.  He isn't strong enough to do some of the things he wants to do.  He is unstable on his feet.  He has been clear about his desire for DNR/DNI.  They have been in the process of requesting palliative care support.   Assessment & Plan:   Principal Problem:   SDH (subdural hematoma) (HCC) Active Problems:   Diabetes mellitus type 2 with complications (HCC)   Hyperlipidemia associated with type 2 diabetes mellitus (HCC)   COPD (chronic obstructive pulmonary disease) (HCC)   CAD (coronary artery disease)   Dementia without behavioral disturbance (HCC)   DNR (do not resuscitate)  Subdural hematoma Acute metabolic encephalopathy Diabetes mellitus Hyperlipidemia COPD CAD Dementia without behavioral disturbances CKd stage III AAA  Overall patient clinical status appears to be deteriorating, he does appear to be in quite distress and comfortable this morning, minimally arousable, increased upper airway  secretions and gurgling, and some respiratory distress, poor baseline quality given his dementia, and currently with SDH, palliative medicine consulted yesterday, recommendation for no escalation of care, given the amount of discomfort he is currently having, I did discuss with son, informed him about overall poor prognosis, son is agreeable, at this point will focus mainly on comfort, continue with as needed morphine, will add Ativan, will continue with palliative care order set, no further labs, for now I will continue with as needed morphine, but he does appear to requiring frequent dosing to achieve any degree of comfort, likely will need to transitioned to morphine drip, continue with as needed IV Ativan as well.   DVT prophylaxis: None / Comfort Code Status: DNR/comfort Family Communication: discussed with son Disposition:   Status is: Observation  The patient will require care spanning > 2 midnights and should be moved to inpatient because: IV treatments appropriate due to intensity of illness or inability to take PO  Dispo: The patient is from: Home              Anticipated d/c is to: Hospice              Patient currently is not medically stable to d/c.  Patient with significant amount of discomfort requiring frequent IV morphine dosing, likely at one-point need to be transitioned to morphine drip.   Difficult to place patient No  Consultants:   Neurosurgery  Palliative   Subjective:  Patient appears to be uncomfortable, still moaning this morning despite receiving some morphine  Objective: Vitals:   02/07/2021 0342 01/16/2021 0800 02/09/2021 0853 02/10/2021 1000  BP: (!) 155/56  (!) 169/78 (!) 168/66  Pulse: 89 (!) 120 (!) 110 (!) 128  Resp: 19 (!) 34 (!) 28 (!) 38  Temp: 100 F (37.8 C)  (!) 100.4 F (38 C) 99.5 F (37.5 C)  TempSrc: Oral  Oral Axillary  SpO2: 92% 98% 99% 92%  Weight:      Height:        Intake/Output Summary (Last 24 hours) at 02/01/2021  1359 Last data filed at 01/18/2021 1042 Gross per 24 hour  Intake 13 ml  Output 810 ml  Net -797 ml   Filed Weights   Jan 28, 2021 0826  Weight: 71 kg    Examination:  General exam: Frail, ill-appearing, moaning, restless and uncomfortable Respiratory system: Upper airway gurgling, mildly tachypneic Cardiovascular system: S1 & S2 heard, tachycardic  gastrointestinal system: Abdomen is nondistended, soft and nontender. No organomegaly or masses felt. Normal bowel sounds heard. Central nervous system: Obtundent, grimaces to sternal rub Extremities: Unable to assess Skin: No rashes, some facial bruising from fall Psychiatry:  obtundent, unable to assess    Data Reviewed: I have personally reviewed following labs and imaging studies  CBC: Recent Labs  Lab 01/16/21 0312 Jan 28, 2021 0743 02/12/2021 0240  WBC 7.2 8.5 8.2  NEUTROABS 3.6 5.0  --   HGB 10.8* 11.8* 10.7*  HCT 33.5* 36.6* 33.6*  MCV 92.3 92.7 91.6  PLT 124* 133* 124*    Basic Metabolic Panel: Recent Labs  Lab 01/16/21 0312 01/23/2021 0240  NA 137 139  K 4.0 4.2  CL 105 107  CO2 23 21*  GLUCOSE 95 121*  BUN 33* 30*  CREATININE 1.63* 1.56*  CALCIUM 9.0 9.0    GFR: Estimated Creatinine Clearance: 33.5 mL/min (A) (by C-G formula based on SCr of 1.56 mg/dL (H)).  Liver Function Tests: No results for input(s): AST, ALT, ALKPHOS, BILITOT, PROT, ALBUMIN in the last 168 hours.  CBG: Recent Labs  Lab 2021-01-28 1442 Jan 28, 2021 1900 2021-01-28 2104 02/01/2021 0608  GLUCAP 125* 110* 117* 107*     Recent Results (from the past 240 hour(s))  SARS CORONAVIRUS 2 (TAT 6-24 HRS) Nasopharyngeal Nasopharyngeal Swab     Status: None   Collection Time: 01/28/2021 11:46 AM   Specimen: Nasopharyngeal Swab  Result Value Ref Range Status   SARS Coronavirus 2 NEGATIVE NEGATIVE Final    Comment: (NOTE) SARS-CoV-2 target nucleic acids are NOT DETECTED.  The SARS-CoV-2 RNA is generally detectable in upper and lower respiratory  specimens during the acute phase of infection. Negative results do not preclude SARS-CoV-2 infection, do not rule out co-infections with other pathogens, and should not be used as the sole basis for treatment or other patient management decisions. Negative results must be combined with clinical observations, patient history, and epidemiological information. The expected result is Negative.  Fact Sheet for Patients: SugarRoll.be  Fact Sheet for Healthcare Providers: https://www.woods-mathews.com/  This test is not yet approved or cleared by the [place] FDA and  has been authorized for detection and/or diagnosis of SARS-CoV-2 by FDA under an Emergency Use Authorization (EUA). This EUA will remain  in effect (meaning this test can be used) for the duration of the COVID-19 declaration under Se ction 564(b)(1) of the Act, 21 U.S.C. section 360bbb-3(b)(1), unless the authorization is terminated or  revoked sooner.  Performed at [facility], [place] [address] [place], [place] [address]          Radiology Studies: CT HEAD WO CONTRAST  Result Date: 2021-02-11 CLINICAL DATA:  87 year old male with right side subdural hematoma after falls. EXAM: CT HEAD WITHOUT CONTRAST TECHNIQUE: Contiguous axial images were obtained from the base of the skull through the vertex without intravenous contrast. COMPARISON:  Head CT 02/05/2021 and earlier. FINDINGS: Brain: Hyperdense right side subdural hematoma with some redistribution onto the right tentorium since yesterday (coronal image 54). Residual lateral hemispheric blood now measures 4-5 mm in thickness and appears decreased since yesterday. No midline shift now. Stable ventricle size and configuration. Basilar cisterns remain normal. Confluent bilateral cerebral white matter hypodensity. Stable gray-white matter differentiation throughout the brain. No new intracranial hemorrhage. No cortically based  acute infarct identified. Vascular: Calcified atherosclerosis at the skull base. Skull: No acute osseous abnormality identified. Sinuses/Orbits: Visualized paranasal sinuses and mastoids are stable and well aerated. Other: No acute orbit or scalp soft tissue finding. IMPRESSION: 1. Regressed and partially redistributed right side subdural hematoma since yesterday. Residual is now 4-5 mm in maximal thickness with no significant intracranial mass effect. 2. No new intracranial abnormality. Electronically Signed   By: Genevie Ann [name]   On: 02/11/21 08:43   CT Head Wo Contrast  Result Date: 02/12/2021 CLINICAL DATA:  Fall, head trauma EXAM: CT HEAD WITHOUT CONTRAST TECHNIQUE: Contiguous axial images were obtained from the base of the skull through the vertex without intravenous contrast. COMPARISON:  Head CT from earlier today FINDINGS: Brain: Acute hyperdense right cerebral convexity subdural hematoma measuring up to 8 mm thickness with associated mild sulcal effacement, new since head CT from earlier today. Minimal 2 mm left midline shift. No evidence of parenchymal hemorrhage. No mass lesions. No CT evidence of acute infarction. Nonspecific prominent subcortical and periventricular white matter hypodensity, most in keeping with chronic small vessel ischemic change. Generalized cerebral volume loss. Cerebral ventricle sizes are stable and concordant with the degree of cerebral volume loss. Vascular: No acute abnormality. Skull: No evidence of calvarial fracture. Sinuses/Orbits: The visualized paranasal sinuses are essentially clear. Other:  The mastoid air cells are unopacified. IMPRESSION: 1. Acute hyperdense right cerebral convexity subdural hematoma measuring up to 8 mm thickness with associated mild sulcal effacement and minimal 2 mm left midline shift. 2. Generalized cerebral volume loss and prominent chronic small vessel ischemic changes in the cerebral white matter. Critical Value/emergent results were called  by telephone at the time of interpretation on 01/22/2021 at 8:58 am to provider [facility] , who verbally acknowledged these results. Electronically Signed   By: Ilona Sorrel [name]   On: 01/18/2021 09:01   CT Head Wo Contrast  Result Date: 01/26/2021 CLINICAL DATA:  nursing home- found on floor, staff had put blanket on patient with right elbow abrasion. Unwitnessed fall. CKD,CHF, Dementia, TDM2 EXAM: CT HEAD WITHOUT CONTRAST CT CERVICAL SPINE WITHOUT CONTRAST TECHNIQUE: Multidetector CT imaging of the head and cervical spine was performed following the standard protocol without intravenous contrast. Multiplanar CT image reconstructions of the cervical spine were also generated. COMPARISON:  CT head cervical spine 09/10/2020, CT head cervical spine 01/19/2021 FINDINGS: CT HEAD FINDINGS Brain: Cerebral ventricle sizes are concordant with the degree of cerebral volume loss. Patchy and confluent areas of decreased attenuation are noted throughout the deep and periventricular white matter of the cerebral hemispheres bilaterally, compatible with chronic microvascular ischemic disease. No evidence of large-territorial acute  infarction. No parenchymal hemorrhage. No mass lesion. No extra-axial collection. No mass effect or midline shift. No hydrocephalus. Basilar cisterns are patent. Vascular: No hyperdense vessel. Atherosclerotic calcifications are present within the cavernous internal carotid arteries. Skull: No acute fracture or focal lesion. Sinuses/Orbits: Paranasal sinuses and mastoid air cells are clear. The orbits are unremarkable. Other: Redemonstration of mild right frontal scalp edema. CT CERVICAL SPINE FINDINGS Alignment: Normal. Skull base and vertebrae: Multilevel degenerative changes of the spine. No acute fracture. No aggressive appearing focal osseous lesion or focal pathologic process. Soft tissues and spinal canal: No prevertebral fluid or swelling. No visible canal hematoma. Visualized lungs:  Biapical pleural/pulmonary scarring. Similar-appearing nodular subpleural opacity measuring 9 mm at the left apex. Emphysematous changes. Other: Asymmetric oropharynx with fullness on the right (5:53, 8:46) with finding likely related to tongue positioning given no visualization of similar finding on CT cervical spine 01/19/2021. IMPRESSION: 1.  No acute intracranial abnormality. 2. No acute displaced fracture or traumatic listhesis of the cervical spine. Electronically Signed   By: Iven Finn [name]   On: 01/19/2021 03:55   CT Head Wo Contrast  Result Date: 01/19/2021 CLINICAL DATA:  Fall. Head trauma. Patient on blood thinners. Coagulopathy. EXAM: CT HEAD WITHOUT CONTRAST CT CERVICAL SPINE WITHOUT CONTRAST TECHNIQUE: Multidetector CT imaging of the head and cervical spine was performed following the standard protocol without intravenous contrast. Multiplanar CT image reconstructions of the cervical spine were also generated. COMPARISON:  CT of the head without contrast 01/16/2021 for the same indication in 09/10/2020 FINDINGS: CT HEAD FINDINGS Brain: Moderate atrophy and white matter disease is stable. Remote lacunar infarcts of the basal ganglia are stable. No acute infarct, hemorrhage, or mass lesion is present. The ventricles are proportionate to the degree of atrophy. No significant extraaxial fluid collection is present. The brainstem and cerebellum are within normal limits. Vascular: Atherosclerotic calcifications are present in the cavernous internal carotid arteries and at the dural margin of both vertebral arteries. No significant hyperdense vessel is present. Skull: Minimal supraorbital frontal scalp soft tissue swelling is improved. No new soft tissue injuries are present. Calvarium is intact. Sinuses/Orbits: The paranasal sinuses and mastoid air cells are clear. The globes and orbits are within normal limits. CT CERVICAL SPINE FINDINGS Alignment: No significant listhesis or change is present.  Skull base and vertebrae: A cervical junction is normal. Soft tissues and spinal canal: No prevertebral fluid or swelling. No visible canal hematoma. Atherosclerotic calcifications are present at the carotid bifurcations bilaterally without definite stenosis. Disc levels: Uncovertebral spurring and foraminal stenosis is greatest at C6-7 bilaterally and on the left at C5-6. Upper chest: Centrilobular emphysematous changes noted. Scarring at the right apex is stable. IMPRESSION: 1. No acute intracranial abnormality or significant interval change. 2. Stable atrophy and white matter disease. 3. Remote lacunar infarcts of the basal ganglia are stable. 4. Improved supraorbital frontal scalp soft tissue swelling. 5. No acute fracture or traumatic subluxation in the cervical spine. 6. Stable multilevel degenerative changes of the cervical spine as described. 7. Emphysema (ICD10-J43.9). Electronically Signed   By: San Morelle [name]   On: 01/19/2021 17:29   CT Cervical Spine Wo Contrast  Result Date: 01/30/2021 CLINICAL DATA:  nursing home- found on floor, staff had put blanket on patient with right elbow abrasion. Unwitnessed fall. CKD,CHF, Dementia, TDM2 EXAM: CT HEAD WITHOUT CONTRAST CT CERVICAL SPINE WITHOUT CONTRAST TECHNIQUE: Multidetector CT imaging of the head and cervical spine was performed following the standard protocol without intravenous contrast. Multiplanar CT  image reconstructions of the cervical spine were also generated. COMPARISON:  CT head cervical spine 09/10/2020, CT head cervical spine 01/19/2021 FINDINGS: CT HEAD FINDINGS Brain: Cerebral ventricle sizes are concordant with the degree of cerebral volume loss. Patchy and confluent areas of decreased attenuation are noted throughout the deep and periventricular white matter of the cerebral hemispheres bilaterally, compatible with chronic microvascular ischemic disease. No evidence of large-territorial acute infarction. No parenchymal  hemorrhage. No mass lesion. No extra-axial collection. No mass effect or midline shift. No hydrocephalus. Basilar cisterns are patent. Vascular: No hyperdense vessel. Atherosclerotic calcifications are present within the cavernous internal carotid arteries. Skull: No acute fracture or focal lesion. Sinuses/Orbits: Paranasal sinuses and mastoid air cells are clear. The orbits are unremarkable. Other: Redemonstration of mild right frontal scalp edema. CT CERVICAL SPINE FINDINGS Alignment: Normal. Skull base and vertebrae: Multilevel degenerative changes of the spine. No acute fracture. No aggressive appearing focal osseous lesion or focal pathologic process. Soft tissues and spinal canal: No prevertebral fluid or swelling. No visible canal hematoma. Visualized lungs: Biapical pleural/pulmonary scarring. Similar-appearing nodular subpleural opacity measuring 9 mm at the left apex. Emphysematous changes. Other: Asymmetric oropharynx with fullness on the right (5:53, 8:46) with finding likely related to tongue positioning given no visualization of similar finding on CT cervical spine 01/19/2021. IMPRESSION: 1.  No acute intracranial abnormality. 2. No acute displaced fracture or traumatic listhesis of the cervical spine. Electronically Signed   By: Iven Finn [name]   On: 02/11/2021 03:55   CT Cervical Spine Wo Contrast  Result Date: 01/19/2021 CLINICAL DATA:  Fall. Head trauma. Patient on blood thinners. Coagulopathy. EXAM: CT HEAD WITHOUT CONTRAST CT CERVICAL SPINE WITHOUT CONTRAST TECHNIQUE: Multidetector CT imaging of the head and cervical spine was performed following the standard protocol without intravenous contrast. Multiplanar CT image reconstructions of the cervical spine were also generated. COMPARISON:  CT of the head without contrast 01/16/2021 for the same indication in 09/10/2020 FINDINGS: CT HEAD FINDINGS Brain: Moderate atrophy and white matter disease is stable. Remote lacunar infarcts of the  basal ganglia are stable. No acute infarct, hemorrhage, or mass lesion is present. The ventricles are proportionate to the degree of atrophy. No significant extraaxial fluid collection is present. The brainstem and cerebellum are within normal limits. Vascular: Atherosclerotic calcifications are present in the cavernous internal carotid arteries and at the dural margin of both vertebral arteries. No significant hyperdense vessel is present. Skull: Minimal supraorbital frontal scalp soft tissue swelling is improved. No new soft tissue injuries are present. Calvarium is intact. Sinuses/Orbits: The paranasal sinuses and mastoid air cells are clear. The globes and orbits are within normal limits. CT CERVICAL SPINE FINDINGS Alignment: No significant listhesis or change is present. Skull base and vertebrae: A cervical junction is normal. Soft tissues and spinal canal: No prevertebral fluid or swelling. No visible canal hematoma. Atherosclerotic calcifications are present at the carotid bifurcations bilaterally without definite stenosis. Disc levels: Uncovertebral spurring and foraminal stenosis is greatest at C6-7 bilaterally and on the left at C5-6. Upper chest: Centrilobular emphysematous changes noted. Scarring at the right apex is stable. IMPRESSION: 1. No acute intracranial abnormality or significant interval change. 2. Stable atrophy and white matter disease. 3. Remote lacunar infarcts of the basal ganglia are stable. 4. Improved supraorbital frontal scalp soft tissue swelling. 5. No acute fracture or traumatic subluxation in the cervical spine. 6. Stable multilevel degenerative changes of the cervical spine as described. 7. Emphysema (ICD10-J43.9). Electronically Signed   By: San Morelle  [name]   On: 01/19/2021 17:29   DG Chest Portable 1 View  Result Date: 01/25/2021 CLINICAL DATA:  87 year old male status post fall.  Found down. EXAM: PORTABLE CHEST 1 VIEW COMPARISON:  Portable chest.  01/19/2021 and  earlier FINDINGS: Portable AP semi upright view at 0744 hours. Stable pulmonary hyperinflation. Prior CABG. Normal cardiac size and mediastinal contours. Visualized tracheal air column is within normal limits. Allowing for portable technique the lungs are clear. No pneumothorax or pleural effusion. No acute osseous abnormality identified. Negative visible bowel gas pattern. IMPRESSION: No acute cardiopulmonary abnormality or acute traumatic injury identified. Electronically Signed   By: Genevie Ann [name]   On: 02/07/2021 08:09        Scheduled Meds: . docusate sodium  100 mg Oral BID  . melatonin  5 mg Oral QHS  . memantine  10 mg Oral BID  . sodium chloride flush  3 mL Intravenous Q12H  . tamsulosin  0.4 mg Oral Daily  . valproic acid  250 mg Oral QID   Continuous Infusions:   LOS: 0 days      Phillips Climes, MD [facility]   To contact the attending provider between 7A-7P or the covering provider during after hours 7P-7A, please log into the web site [email or web address] and access using universal [place] password for that web site. If you do not have the password, please call the hospital operator.  02-01-21, 1:59 PM

## 2021-02-14 NOTE — Death Summary Note (Signed)
DEATH SUMMARY   Patient Details  Name: Troy [name] MRN: [id number] DOB: May 25, 1933  Admission/Discharge Information   Admit Date:  01/24/21  Date of Death: Date of Death: 01/25/21  Time of Death: Time of Death: Nov 27, 2123  Length of Stay: 1  Referring Physician: [name], MD   Reason(s) for Hospitalization   Subdural hematoma from fall  Diagnoses  Preliminary cause of death:    Subdural hematoma Due to  - fall - Chronic anticoagulation  Secondary Diagnoses (including complications and co-morbidities):  Principal Problem:   SDH (subdural hematoma) ([place]) Active Problems:   Diabetes mellitus type 2 with complications ([place])   Hyperlipidemia associated with type 2 diabetes mellitus ([place])   COPD (chronic obstructive pulmonary disease) ([facility])   CAD (coronary artery disease)   Anemia   DNR (do not resuscitate)   Subdural hematoma ([place])   AMS (altered mental status)   CKD (chronic kidney disease) stage 3, GFR 30-59 ml/min ([place])   Anticoagulated   Brief Hospital Course (including significant findings, care, treatment, and services provided and events leading to death)  Troy [name] is a 87 y.o. year old male with malewith medical history significant ofSVT; dementia; BPH; HLD; DM; afib on Eliquis; COPD; and CAD presenting with a fall. He presents with fall, his work-up significant for subdural hematoma, had 3 falls in 2 days,He has dementia and it is getting worse. He was agitated night before admission looking for his deceased wife. Prior to this, he fell at Christmas at home - had COVID PNA and then to [facility] for rehab and moved to [place] about 6 weeks ago. He has fallen a few times at [place] to use his walker and doesn't want help. He is unstable on his feet. He has been clear about his desire for DNR/DNI. They have been in the process of requesting palliative care support.  Fifth ED visit in 6 months, Patient work-up significant for  subdural hematoma, for which he is admitted for further management, anticoagulation has been discontinued, and by neurosurgery, recommendation has been made with close monitoring, repeat CT in a.m. this is done operative lesion, patient received Kcentra to reverse his Eliquis, as well given overall poor anticipated prognosis, and poor life quality and dementia to start with, palliative medicine has been consulted, and appropriately patient plan was to continue with current measure, no escalation of care, and if he is to continue deteriorating then plan should be for comfort measures only, patient mentation continues to worsen, as well he seems to be in discomfort, with dyspnea and gurgling, he is obtundent as well, given his current symptoms including respiratory distress, and worsening mentation, and poor baseline quality, with dementia, multiple falls, multiple ED visits and poor life quality, decision has been made to proceed with full comfort measures after discussing with the son, patient initially started on as needed morphine, despite that he remains to be tachypneic, with significant gurgling despite receiving glycopyrrolate, so he was transitioned to morphine drip, as needed Ativan, scopolamine patch, met with family, answered all their questions, son  at bedside when patient passed away, medical examiner was called, and patient qualified as a medical examiner case.    dyspnea, gurgling,    Pertinent Labs and Studies  Significant Diagnostic Studies DG Elbow Complete Right  Result Date: 01/16/2021 CLINICAL DATA:  Fall with skin tear EXAM: RIGHT ELBOW - COMPLETE 3+ VIEW COMPARISON:  None. FINDINGS: There is no evidence of fracture, dislocation, or joint effusion. Generalized osteopenia with  degenerative elbow spurring and joint narrowing. No opaque foreign body. IMPRESSION: No acute finding. Electronically Signed   By: Monte Fantasia [name]   On: 01/16/2021 04:06   CT HEAD WO CONTRAST  Result  Date: 02-12-2021 CLINICAL DATA:  87 year old male with right side subdural hematoma after falls. EXAM: CT HEAD WITHOUT CONTRAST TECHNIQUE: Contiguous axial images were obtained from the base of the skull through the vertex without intravenous contrast. COMPARISON:  Head CT 01/18/2021 and earlier. FINDINGS: Brain: Hyperdense right side subdural hematoma with some redistribution onto the right tentorium since yesterday (coronal image 54). Residual lateral hemispheric blood now measures 4-5 mm in thickness and appears decreased since yesterday. No midline shift now. Stable ventricle size and configuration. Basilar cisterns remain normal. Confluent bilateral cerebral white matter hypodensity. Stable gray-white matter differentiation throughout the brain. No new intracranial hemorrhage. No cortically based acute infarct identified. Vascular: Calcified atherosclerosis at the skull base. Skull: No acute osseous abnormality identified. Sinuses/Orbits: Visualized paranasal sinuses and mastoids are stable and well aerated. Other: No acute orbit or scalp soft tissue finding. IMPRESSION: 1. Regressed and partially redistributed right side subdural hematoma since yesterday. Residual is now 4-5 mm in maximal thickness with no significant intracranial mass effect. 2. No new intracranial abnormality. Electronically Signed   By: Genevie Ann [name]   On: 02/12/2021 08:43   CT Head Wo Contrast  Result Date: 02/05/2021 CLINICAL DATA:  Fall, head trauma EXAM: CT HEAD WITHOUT CONTRAST TECHNIQUE: Contiguous axial images were obtained from the base of the skull through the vertex without intravenous contrast. COMPARISON:  Head CT from earlier today FINDINGS: Brain: Acute hyperdense right cerebral convexity subdural hematoma measuring up to 8 mm thickness with associated mild sulcal effacement, new since head CT from earlier today. Minimal 2 mm left midline shift. No evidence of parenchymal hemorrhage. No mass lesions. No CT evidence of acute  infarction. Nonspecific prominent subcortical and periventricular white matter hypodensity, most in keeping with chronic small vessel ischemic change. Generalized cerebral volume loss. Cerebral ventricle sizes are stable and concordant with the degree of cerebral volume loss. Vascular: No acute abnormality. Skull: No evidence of calvarial fracture. Sinuses/Orbits: The visualized paranasal sinuses are essentially clear. Other:  The mastoid air cells are unopacified. IMPRESSION: 1. Acute hyperdense right cerebral convexity subdural hematoma measuring up to 8 mm thickness with associated mild sulcal effacement and minimal 2 mm left midline shift. 2. Generalized cerebral volume loss and prominent chronic small vessel ischemic changes in the cerebral white matter. Critical Value/emergent results were called by telephone at the time of interpretation on 01/14/2021 at 8:58 am to provider [facility] , who verbally acknowledged these results. Electronically Signed   By: Ilona Sorrel [name]   On: 01/23/2021 09:01   CT Head Wo Contrast  Result Date: 01/20/2021 CLINICAL DATA:  nursing home- found on floor, staff had put blanket on patient with right elbow abrasion. Unwitnessed fall. CKD,CHF, Dementia, TDM2 EXAM: CT HEAD WITHOUT CONTRAST CT CERVICAL SPINE WITHOUT CONTRAST TECHNIQUE: Multidetector CT imaging of the head and cervical spine was performed following the standard protocol without intravenous contrast. Multiplanar CT image reconstructions of the cervical spine were also generated. COMPARISON:  CT head cervical spine 09/10/2020, CT head cervical spine 01/19/2021 FINDINGS: CT HEAD FINDINGS Brain: Cerebral ventricle sizes are concordant with the degree of cerebral volume loss. Patchy and confluent areas of decreased attenuation are noted throughout the deep and periventricular white matter of the cerebral hemispheres bilaterally, compatible with chronic microvascular ischemic disease. No evidence  of large-territorial  acute infarction. No parenchymal hemorrhage. No mass lesion. No extra-axial collection. No mass effect or midline shift. No hydrocephalus. Basilar cisterns are patent. Vascular: No hyperdense vessel. Atherosclerotic calcifications are present within the cavernous internal carotid arteries. Skull: No acute fracture or focal lesion. Sinuses/Orbits: Paranasal sinuses and mastoid air cells are clear. The orbits are unremarkable. Other: Redemonstration of mild right frontal scalp edema. CT CERVICAL SPINE FINDINGS Alignment: Normal. Skull base and vertebrae: Multilevel degenerative changes of the spine. No acute fracture. No aggressive appearing focal osseous lesion or focal pathologic process. Soft tissues and spinal canal: No prevertebral fluid or swelling. No visible canal hematoma. Visualized lungs: Biapical pleural/pulmonary scarring. Similar-appearing nodular subpleural opacity measuring 9 mm at the left apex. Emphysematous changes. Other: Asymmetric oropharynx with fullness on the right (5:53, 8:46) with finding likely related to tongue positioning given no visualization of similar finding on CT cervical spine 01/19/2021. IMPRESSION: 1.  No acute intracranial abnormality. 2. No acute displaced fracture or traumatic listhesis of the cervical spine. Electronically Signed   By: Tish Frederickson [name]   On: 02/01/2021 03:55   CT Head Wo Contrast  Result Date: 01/19/2021 CLINICAL DATA:  Fall. Head trauma. Patient on blood thinners. Coagulopathy. EXAM: CT HEAD WITHOUT CONTRAST CT CERVICAL SPINE WITHOUT CONTRAST TECHNIQUE: Multidetector CT imaging of the head and cervical spine was performed following the standard protocol without intravenous contrast. Multiplanar CT image reconstructions of the cervical spine were also generated. COMPARISON:  CT of the head without contrast 01/16/2021 for the same indication in 09/10/2020 FINDINGS: CT HEAD FINDINGS Brain: Moderate atrophy and white matter disease is stable. Remote  lacunar infarcts of the basal ganglia are stable. No acute infarct, hemorrhage, or mass lesion is present. The ventricles are proportionate to the degree of atrophy. No significant extraaxial fluid collection is present. The brainstem and cerebellum are within normal limits. Vascular: Atherosclerotic calcifications are present in the cavernous internal carotid arteries and at the dural margin of both vertebral arteries. No significant hyperdense vessel is present. Skull: Minimal supraorbital frontal scalp soft tissue swelling is improved. No new soft tissue injuries are present. Calvarium is intact. Sinuses/Orbits: The paranasal sinuses and mastoid air cells are clear. The globes and orbits are within normal limits. CT CERVICAL SPINE FINDINGS Alignment: No significant listhesis or change is present. Skull base and vertebrae: A cervical junction is normal. Soft tissues and spinal canal: No prevertebral fluid or swelling. No visible canal hematoma. Atherosclerotic calcifications are present at the carotid bifurcations bilaterally without definite stenosis. Disc levels: Uncovertebral spurring and foraminal stenosis is greatest at C6-7 bilaterally and on the left at C5-6. Upper chest: Centrilobular emphysematous changes noted. Scarring at the right apex is stable. IMPRESSION: 1. No acute intracranial abnormality or significant interval change. 2. Stable atrophy and white matter disease. 3. Remote lacunar infarcts of the basal ganglia are stable. 4. Improved supraorbital frontal scalp soft tissue swelling. 5. No acute fracture or traumatic subluxation in the cervical spine. 6. Stable multilevel degenerative changes of the cervical spine as described. 7. Emphysema (ICD10-J43.9). Electronically Signed   By: Marin Roberts [name]   On: 01/19/2021 17:29   CT Head Wo Contrast  Result Date: 01/16/2021 CLINICAL DATA:  Level 2 fall on blood thinners EXAM: CT HEAD WITHOUT CONTRAST CT CERVICAL SPINE WITHOUT CONTRAST  TECHNIQUE: Multidetector CT imaging of the head and cervical spine was performed following the standard protocol without intravenous contrast. Multiplanar CT image reconstructions of the cervical spine were also generated. COMPARISON:  09/10/2020 FINDINGS: CT HEAD FINDINGS Brain: No evidence of acute infarction, hemorrhage, hydrocephalus, extra-axial collection or mass lesion/mass effect. Confluent chronic small vessel ischemic gliosis in the cerebral white matter. Brain atrophy most notably affecting the inferior temporal lobes. Motion artifact at the vertex. Vascular: Atheromatous calcification Skull: Negative for fracture Sinuses/Orbits: Negative Other: Smoothly contoured right eccentric nasopharyngeal mass measuring 2 cm in length, most likely a retention cyst given appearance and stability from prior. No associated mastoid effusion. CT CERVICAL SPINE FINDINGS Alignment: No traumatic malalignment Skull base and vertebrae: No acute fracture Soft tissues and spinal canal: No prevertebral fluid or swelling. No visible canal hematoma. Disc levels:  Ordinary cervical spine degeneration for age. Upper chest: Partially calcified pleural plaque at the apices. IMPRESSION: 1. No evidence of acute intracranial or cervical spine injury. 2. Chronic/senescent changes as described. 3. Motion artifact on the head CT at the vertex. Electronically Signed   By: Monte Fantasia [name]   On: 01/16/2021 04:20   CT Cervical Spine Wo Contrast  Result Date: 01/28/2021 CLINICAL DATA:  nursing home- found on floor, staff had put blanket on patient with right elbow abrasion. Unwitnessed fall. CKD,CHF, Dementia, TDM2 EXAM: CT HEAD WITHOUT CONTRAST CT CERVICAL SPINE WITHOUT CONTRAST TECHNIQUE: Multidetector CT imaging of the head and cervical spine was performed following the standard protocol without intravenous contrast. Multiplanar CT image reconstructions of the cervical spine were also generated. COMPARISON:  CT head cervical spine  09/10/2020, CT head cervical spine 01/19/2021 FINDINGS: CT HEAD FINDINGS Brain: Cerebral ventricle sizes are concordant with the degree of cerebral volume loss. Patchy and confluent areas of decreased attenuation are noted throughout the deep and periventricular white matter of the cerebral hemispheres bilaterally, compatible with chronic microvascular ischemic disease. No evidence of large-territorial acute infarction. No parenchymal hemorrhage. No mass lesion. No extra-axial collection. No mass effect or midline shift. No hydrocephalus. Basilar cisterns are patent. Vascular: No hyperdense vessel. Atherosclerotic calcifications are present within the cavernous internal carotid arteries. Skull: No acute fracture or focal lesion. Sinuses/Orbits: Paranasal sinuses and mastoid air cells are clear. The orbits are unremarkable. Other: Redemonstration of mild right frontal scalp edema. CT CERVICAL SPINE FINDINGS Alignment: Normal. Skull base and vertebrae: Multilevel degenerative changes of the spine. No acute fracture. No aggressive appearing focal osseous lesion or focal pathologic process. Soft tissues and spinal canal: No prevertebral fluid or swelling. No visible canal hematoma. Visualized lungs: Biapical pleural/pulmonary scarring. Similar-appearing nodular subpleural opacity measuring 9 mm at the left apex. Emphysematous changes. Other: Asymmetric oropharynx with fullness on the right (5:53, 8:46) with finding likely related to tongue positioning given no visualization of similar finding on CT cervical spine 01/19/2021. IMPRESSION: 1.  No acute intracranial abnormality. 2. No acute displaced fracture or traumatic listhesis of the cervical spine. Electronically Signed   By: Iven Finn [name]   On: 02/11/2021 03:55   CT Cervical Spine Wo Contrast  Result Date: 01/19/2021 CLINICAL DATA:  Fall. Head trauma. Patient on blood thinners. Coagulopathy. EXAM: CT HEAD WITHOUT CONTRAST CT CERVICAL SPINE WITHOUT CONTRAST  TECHNIQUE: Multidetector CT imaging of the head and cervical spine was performed following the standard protocol without intravenous contrast. Multiplanar CT image reconstructions of the cervical spine were also generated. COMPARISON:  CT of the head without contrast 01/16/2021 for the same indication in 09/10/2020 FINDINGS: CT HEAD FINDINGS Brain: Moderate atrophy and white matter disease is stable. Remote lacunar infarcts of the basal ganglia are stable. No acute infarct, hemorrhage, or mass lesion is present. The ventricles  are proportionate to the degree of atrophy. No significant extraaxial fluid collection is present. The brainstem and cerebellum are within normal limits. Vascular: Atherosclerotic calcifications are present in the cavernous internal carotid arteries and at the dural margin of both vertebral arteries. No significant hyperdense vessel is present. Skull: Minimal supraorbital frontal scalp soft tissue swelling is improved. No new soft tissue injuries are present. Calvarium is intact. Sinuses/Orbits: The paranasal sinuses and mastoid air cells are clear. The globes and orbits are within normal limits. CT CERVICAL SPINE FINDINGS Alignment: No significant listhesis or change is present. Skull base and vertebrae: A cervical junction is normal. Soft tissues and spinal canal: No prevertebral fluid or swelling. No visible canal hematoma. Atherosclerotic calcifications are present at the carotid bifurcations bilaterally without definite stenosis. Disc levels: Uncovertebral spurring and foraminal stenosis is greatest at C6-7 bilaterally and on the left at C5-6. Upper chest: Centrilobular emphysematous changes noted. Scarring at the right apex is stable. IMPRESSION: 1. No acute intracranial abnormality or significant interval change. 2. Stable atrophy and white matter disease. 3. Remote lacunar infarcts of the basal ganglia are stable. 4. Improved supraorbital frontal scalp soft tissue swelling. 5. No acute  fracture or traumatic subluxation in the cervical spine. 6. Stable multilevel degenerative changes of the cervical spine as described. 7. Emphysema (ICD10-J43.9). Electronically Signed   By: San Morelle [name]   On: 01/19/2021 17:29   CT Cervical Spine Wo Contrast  Result Date: 01/16/2021 CLINICAL DATA:  Level 2 fall on blood thinners EXAM: CT HEAD WITHOUT CONTRAST CT CERVICAL SPINE WITHOUT CONTRAST TECHNIQUE: Multidetector CT imaging of the head and cervical spine was performed following the standard protocol without intravenous contrast. Multiplanar CT image reconstructions of the cervical spine were also generated. COMPARISON:  09/10/2020 FINDINGS: CT HEAD FINDINGS Brain: No evidence of acute infarction, hemorrhage, hydrocephalus, extra-axial collection or mass lesion/mass effect. Confluent chronic small vessel ischemic gliosis in the cerebral white matter. Brain atrophy most notably affecting the inferior temporal lobes. Motion artifact at the vertex. Vascular: Atheromatous calcification Skull: Negative for fracture Sinuses/Orbits: Negative Other: Smoothly contoured right eccentric nasopharyngeal mass measuring 2 cm in length, most likely a retention cyst given appearance and stability from prior. No associated mastoid effusion. CT CERVICAL SPINE FINDINGS Alignment: No traumatic malalignment Skull base and vertebrae: No acute fracture Soft tissues and spinal canal: No prevertebral fluid or swelling. No visible canal hematoma. Disc levels:  Ordinary cervical spine degeneration for age. Upper chest: Partially calcified pleural plaque at the apices. IMPRESSION: 1. No evidence of acute intracranial or cervical spine injury. 2. Chronic/senescent changes as described. 3. Motion artifact on the head CT at the vertex. Electronically Signed   By: Monte Fantasia [name]   On: 01/16/2021 04:20   DG Pelvis Portable  Result Date: 01/16/2021 CLINICAL DATA:  Unwitnessed fall EXAM: PORTABLE PELVIS 1-2 VIEWS  COMPARISON:  CT abdomen pelvis 12/14/2020 FINDINGS: The osseous structures appear diffusely demineralized which may limit detection of small or nondisplaced fractures. Bones of the pelvis appear intact and congruent. Femoral heads appear intact and normally located. Degenerative changes in the lower lumbar spine, SI joints, and bilateral hips. No significant swelling or large hematoma. Moderate colonic stool burden. Remaining soft tissues are free of acute abnormality. Telemetry leads overlie the abdomen. IMPRESSION: No acute osseous abnormality. Degenerative changes as above. Electronically Signed   By: Lovena Le [name]   On: 01/16/2021 03:53   DG Chest Portable 1 View  Result Date: 01/25/2021 CLINICAL DATA:  87 year old male status  post fall.  Found down. EXAM: PORTABLE CHEST 1 VIEW COMPARISON:  Portable chest.  01/19/2021 and earlier FINDINGS: Portable AP semi upright view at 0744 hours. Stable pulmonary hyperinflation. Prior CABG. Normal cardiac size and mediastinal contours. Visualized tracheal air column is within normal limits. Allowing for portable technique the lungs are clear. No pneumothorax or pleural effusion. No acute osseous abnormality identified. Negative visible bowel gas pattern. IMPRESSION: No acute cardiopulmonary abnormality or acute traumatic injury identified. Electronically Signed   By: Genevie Ann [name]   On: 02/10/2021 08:09   DG Chest Portable 1 View  Result Date: 01/19/2021 CLINICAL DATA:  Patient status post fall. EXAM: PORTABLE CHEST 1 VIEW COMPARISON:  Single-view of the chest 01/16/2021. CT chest 09/10/2020. FINDINGS: The lungs are emphysematous but clear. The patient is status post CABG. Aortic atherosclerosis noted. No pneumothorax or pleural effusion. Heart size is normal. No acute or focal bony abnormality. IMPRESSION: No acute disease. Aortic Atherosclerosis (ICD10-I70.0) and Emphysema (ICD10-J43.9). Electronically Signed   By: Inge Rise [name]   On: 01/19/2021 11:47    DG Chest Portable 1 View  Result Date: 01/16/2021 CLINICAL DATA:  Fall EXAM: PORTABLE CHEST 1 VIEW COMPARISON:  12/14/2020 FINDINGS: Postsurgical changes from prior median sternotomy and cardiac bypass. Cardiomediastinal contours are unchanged from prior. Some chronically interstitial features, emphysema and scarring are similar to comparison. No consolidation, features of edema, pneumothorax, or effusion. No acute osseous or soft tissue abnormality. IMPRESSION: No acute cardiopulmonary or traumatic findings of the chest. Chronically coarsened interstitial changes and emphysematous features similar to prior. Prior sternotomy and CABG. Electronically Signed   By: Lovena Le [name]   On: 01/16/2021 03:55    Microbiology Recent Results (from the past 240 hour(s))  SARS CORONAVIRUS 2 (TAT 6-24 HRS) Nasopharyngeal Nasopharyngeal Swab     Status: None   Collection Time: 02/10/2021 11:46 AM   Specimen: Nasopharyngeal Swab  Result Value Ref Range Status   SARS Coronavirus 2 NEGATIVE NEGATIVE Final    Comment: (NOTE) SARS-CoV-2 target nucleic acids are NOT DETECTED.  The SARS-CoV-2 RNA is generally detectable in upper and lower respiratory specimens during the acute phase of infection. Negative results do not preclude SARS-CoV-2 infection, do not rule out co-infections with other pathogens, and should not be used as the sole basis for treatment or other patient management decisions. Negative results must be combined with clinical observations, patient history, and epidemiological information. The expected result is Negative.  Fact Sheet for Patients: SugarRoll.be  Fact Sheet for Healthcare Providers: https://www.woods-mathews.com/  This test is not yet approved or cleared by the [place] FDA and  has been authorized for detection and/or diagnosis of SARS-CoV-2 by FDA under an Emergency Use Authorization (EUA). This EUA will remain  in effect  (meaning this test can be used) for the duration of the COVID-19 declaration under Se ction 564(b)(1) of the Act, 21 U.S.C. section 360bbb-3(b)(1), unless the authorization is terminated or revoked sooner.  Performed at [facility], [place] [address], [place], [place] [address]     Lab Basic Metabolic Panel: Recent Labs  Lab 01/16/21 0312 Feb 04, 2021 0240  NA 137 139  K 4.0 4.2  CL 105 107  CO2 23 21*  GLUCOSE 95 121*  BUN 33* 30*  CREATININE 1.63* 1.56*  CALCIUM 9.0 9.0   Liver Function Tests: No results for input(s): AST, ALT, ALKPHOS, BILITOT, PROT, ALBUMIN in the last 168 hours. No results for input(s): LIPASE, AMYLASE in the last 168 hours. No results for  input(s): AMMONIA in the last 168 hours. CBC: Recent Labs  Lab 01/16/21 0312 02/04/2021 0743 2021-02-16 0240  WBC 7.2 8.5 8.2  NEUTROABS 3.6 5.0  --   HGB 10.8* 11.8* 10.7*  HCT 33.5* 36.6* 33.6*  MCV 92.3 92.7 91.6  PLT 124* 133* 124*   Cardiac Enzymes: No results for input(s): CKTOTAL, CKMB, CKMBINDEX, TROPONINI in the last 168 hours. Sepsis Labs: Recent Labs  Lab 01/16/21 0312 01/28/2021 0743 02-16-2021 0240  WBC 7.2 8.5 8.2    Procedures/Operations     Ronnie Mallette 01/22/2021, 4:23 PM

## 2021-02-14 DEATH — deceased

## 2022-01-04 IMAGING — DX DG ELBOW COMPLETE 3+V*R*
4 series · 4 of 4 positions shown · non-contrast
Comparison: None.

CLINICAL DATA: Fall with skin tear

EXAM:
RIGHT ELBOW - COMPLETE 3+ VIEW

[elbow obl (1 of 2)]
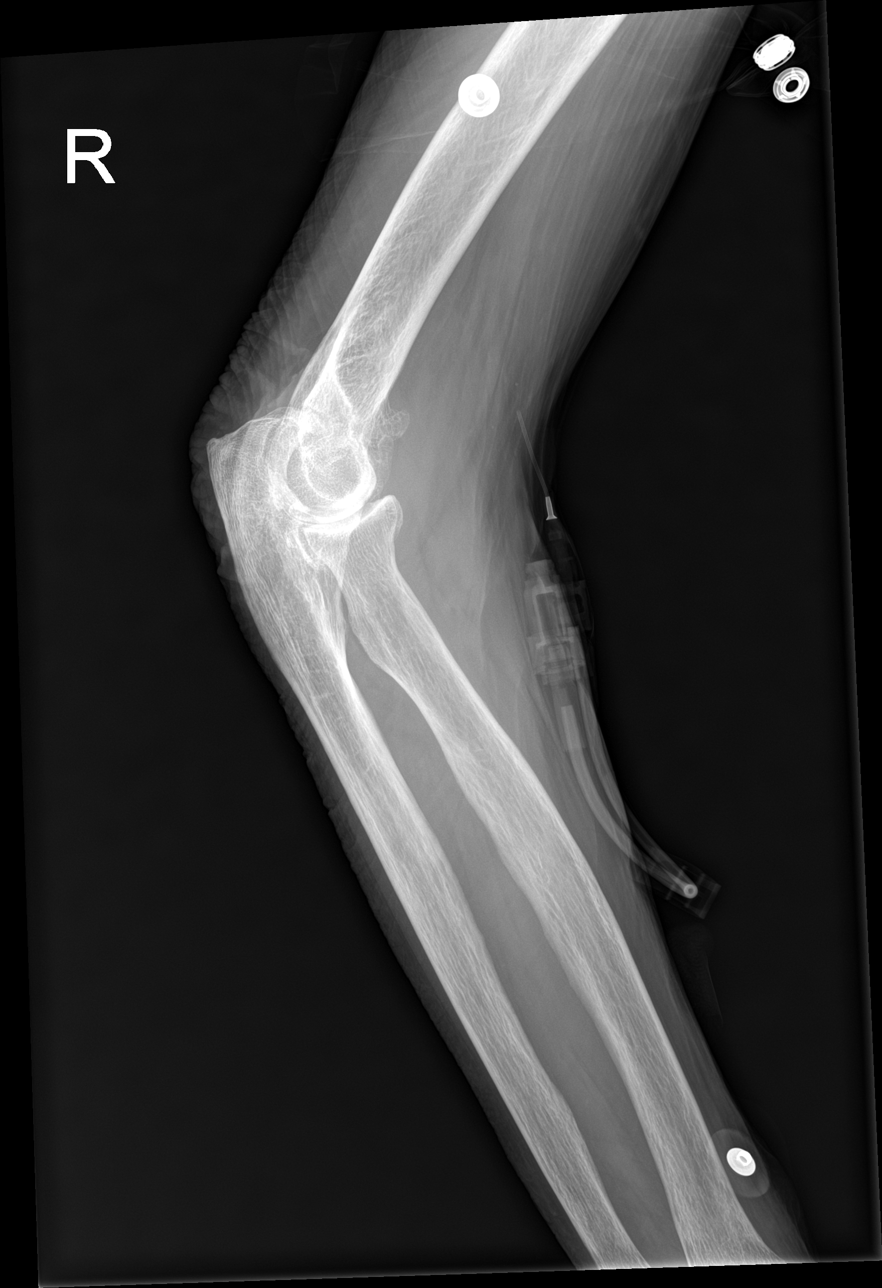

[elbow ap]
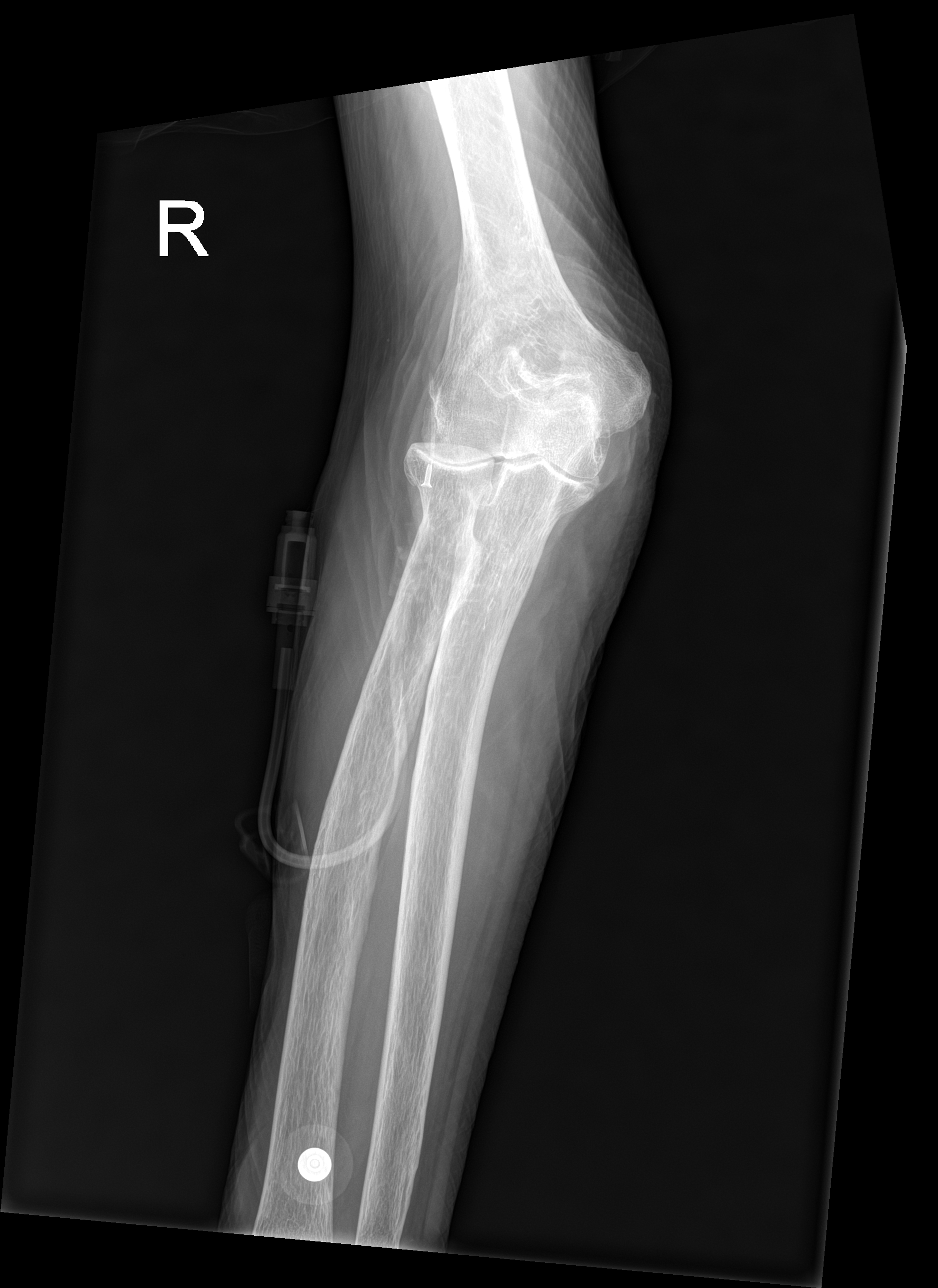

[elbow lat]
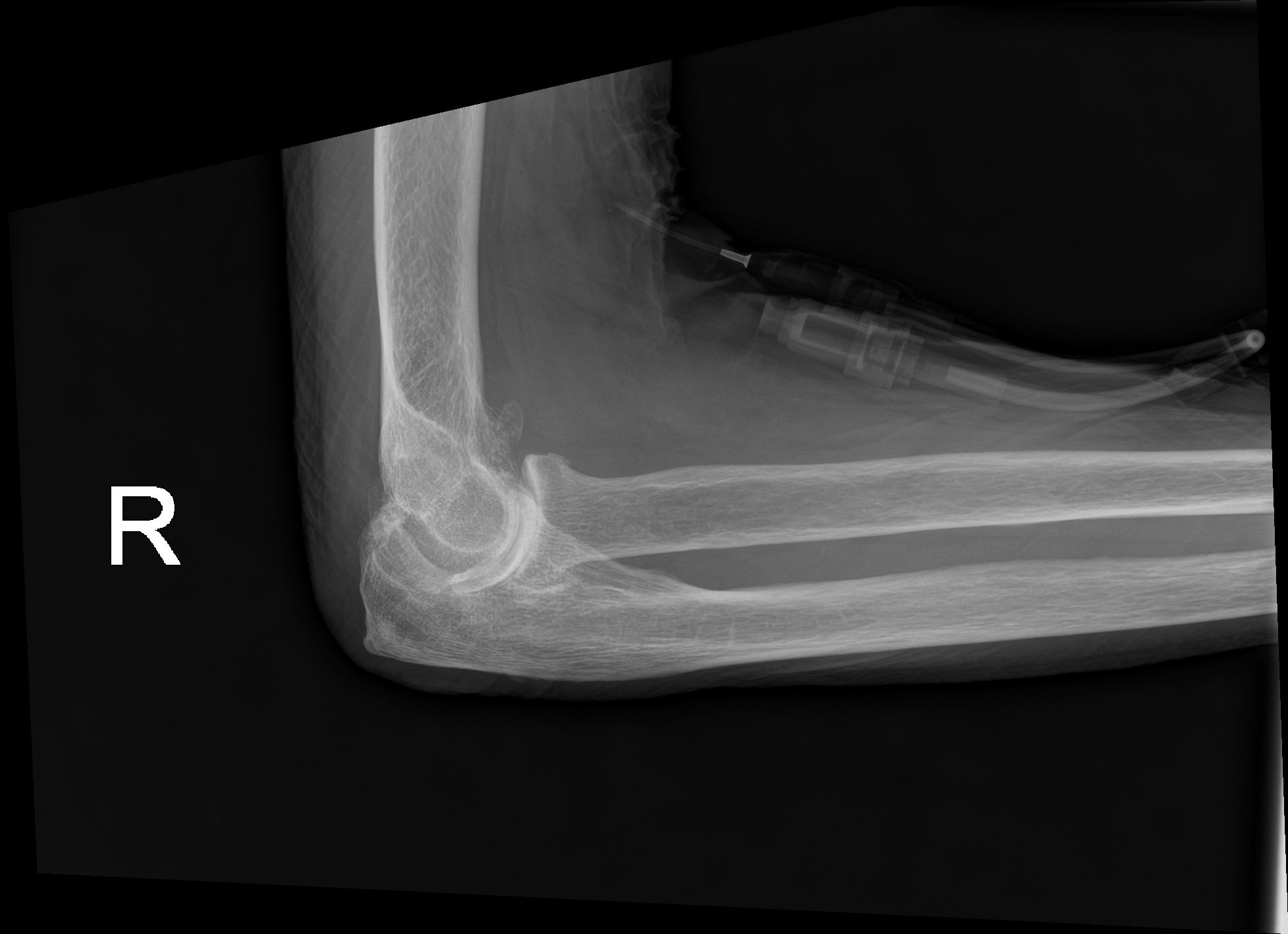

[elbow obl (2 of 2)]
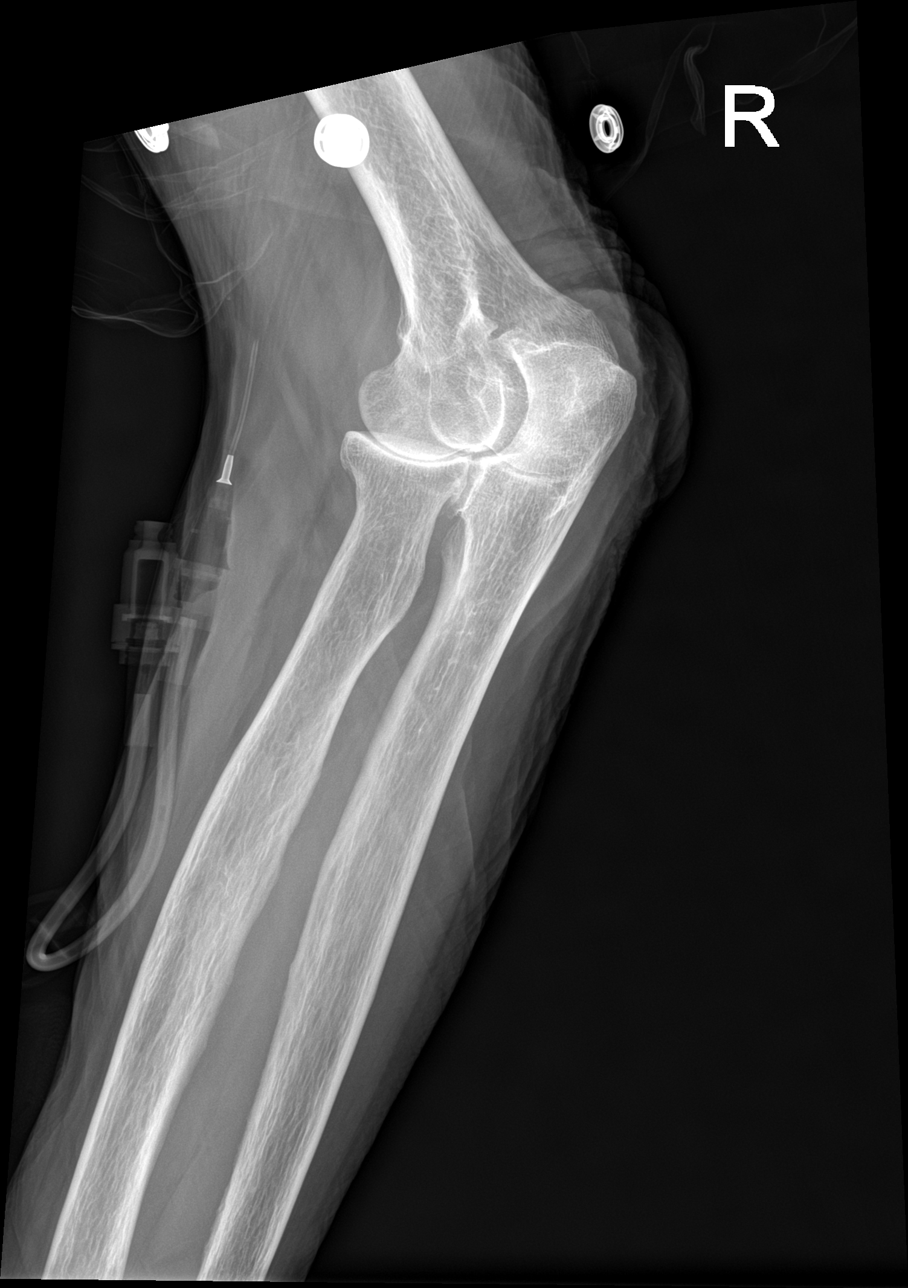

[4 of 4 positions shown; findings below may reference images not displayed]

FINDINGS: There is no evidence of fracture, dislocation, or joint effusion.
Generalized osteopenia with degenerative elbow spurring and joint
narrowing. No opaque foreign body.
IMPRESSION: No acute finding.

## 2022-01-04 IMAGING — DX DG CHEST 1V PORT
1 series · 1 of 1 positions shown · non-contrast
Comparison: 12/14/2020

CLINICAL DATA: Fall

EXAM:
PORTABLE CHEST 1 VIEW

[chest ap]
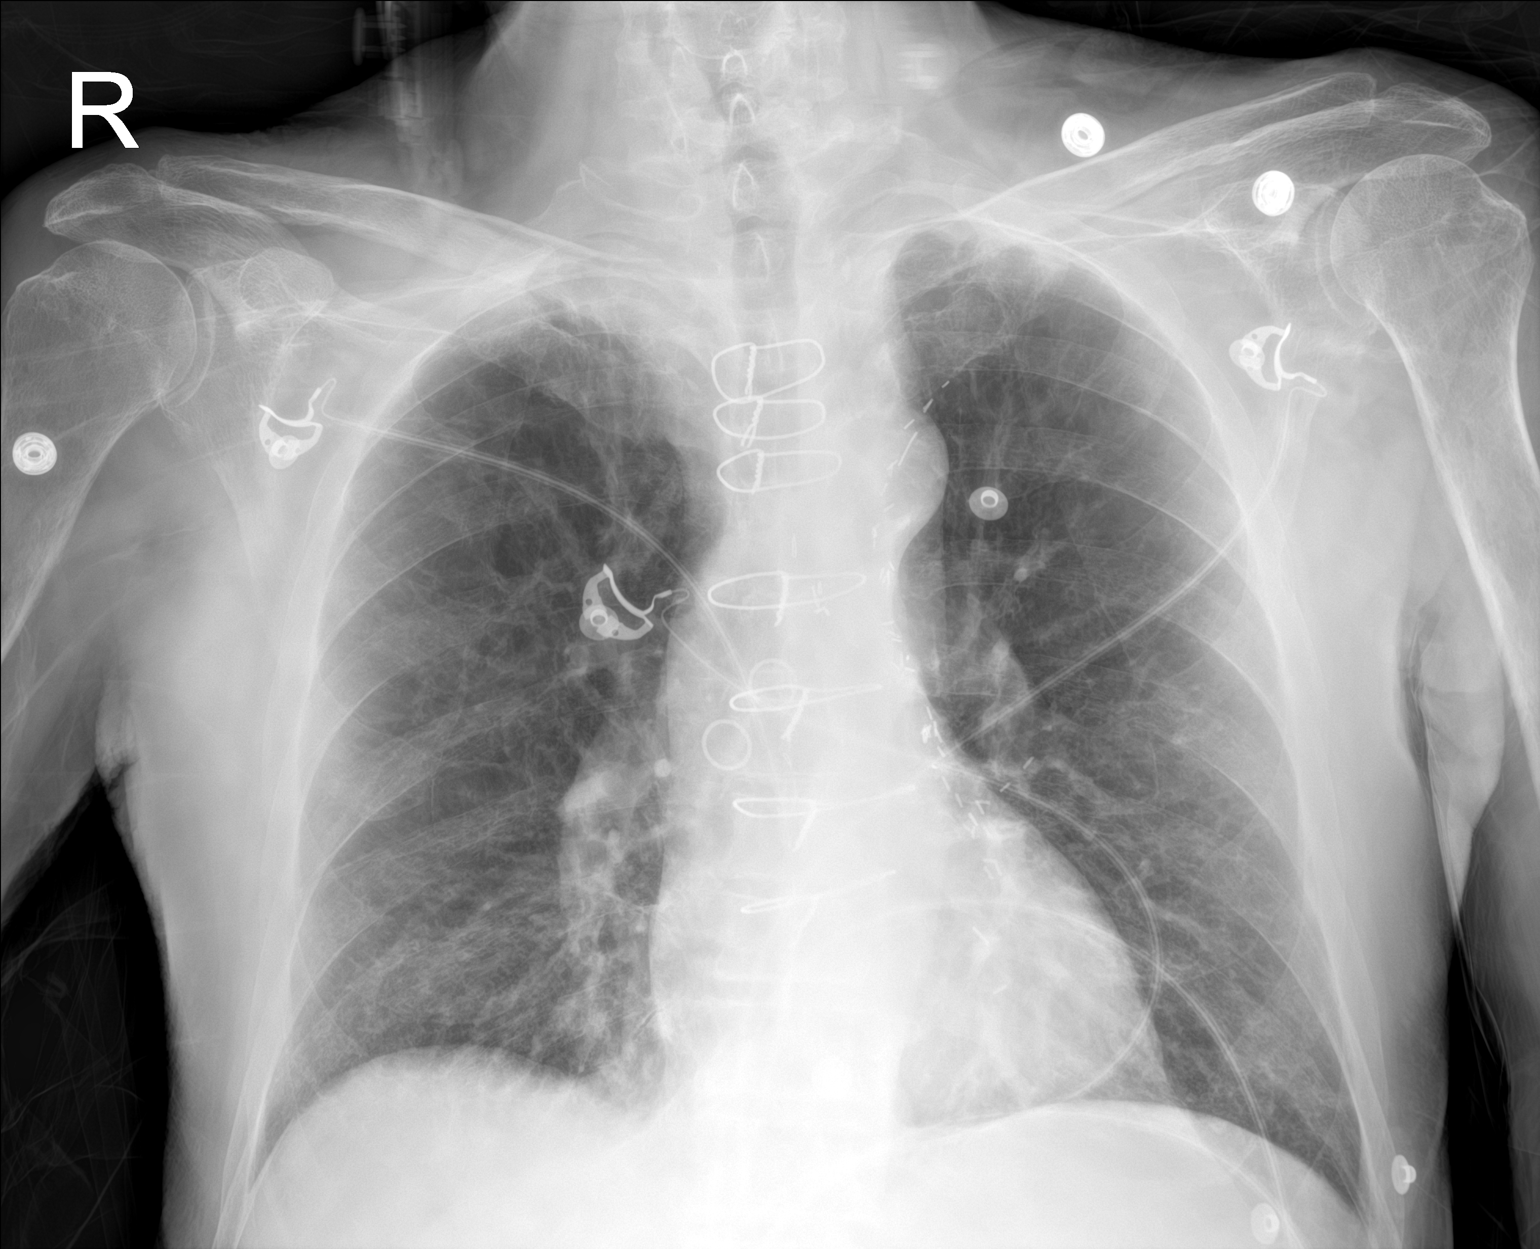

[1 of 1 positions shown; findings below may reference images not displayed]

FINDINGS: Postsurgical changes from prior median sternotomy and cardiac
bypass. Cardiomediastinal contours are unchanged from prior. Some
chronically interstitial features, emphysema and scarring are
similar to comparison. No consolidation, features of edema,
pneumothorax, or effusion. No acute osseous or soft tissue
abnormality.
IMPRESSION: No acute cardiopulmonary or traumatic findings of the chest.

Chronically coarsened interstitial changes and emphysematous
features similar to prior.

Prior sternotomy and CABG.
